# Patient Record
Sex: Male | Born: 1970 | Race: Black or African American | Hispanic: No | Marital: Married | Smoking: Former smoker
Health system: Southern US, Community
[De-identification: ages and names within clinical notes are randomized; demographics above are authoritative.]

## PROBLEM LIST (undated history)

## (undated) DIAGNOSIS — I2510 Atherosclerotic heart disease of native coronary artery without angina pectoris: Secondary | ICD-10-CM

## (undated) DIAGNOSIS — I10 Essential (primary) hypertension: Secondary | ICD-10-CM

## (undated) DIAGNOSIS — K219 Gastro-esophageal reflux disease without esophagitis: Secondary | ICD-10-CM

## (undated) DIAGNOSIS — E669 Obesity, unspecified: Secondary | ICD-10-CM

## (undated) DIAGNOSIS — E785 Hyperlipidemia, unspecified: Secondary | ICD-10-CM

## (undated) DIAGNOSIS — Z9289 Personal history of other medical treatment: Secondary | ICD-10-CM

## (undated) DIAGNOSIS — I252 Old myocardial infarction: Secondary | ICD-10-CM

## (undated) HISTORY — DX: Obesity, unspecified: E66.9

## (undated) HISTORY — DX: Gastro-esophageal reflux disease without esophagitis: K21.9

## (undated) HISTORY — DX: Hyperlipidemia, unspecified: E78.5

---
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
## 2002-07-18 ENCOUNTER — Emergency Department (HOSPITAL_COMMUNITY): Admission: EM | Admit: 2002-07-18 | Discharge: 2002-07-18 | Payer: Self-pay | Admitting: Emergency Medicine

## 2002-07-19 ENCOUNTER — Emergency Department (HOSPITAL_COMMUNITY): Admission: EM | Admit: 2002-07-19 | Discharge: 2002-07-20 | Payer: Self-pay | Admitting: Emergency Medicine

## 2002-07-19 ENCOUNTER — Encounter: Payer: Self-pay | Admitting: Emergency Medicine

## 2002-07-24 ENCOUNTER — Emergency Department (HOSPITAL_COMMUNITY): Admission: EM | Admit: 2002-07-24 | Discharge: 2002-07-24 | Payer: Self-pay | Admitting: Emergency Medicine

## 2002-08-01 ENCOUNTER — Emergency Department (HOSPITAL_COMMUNITY): Admission: EM | Admit: 2002-08-01 | Discharge: 2002-08-01 | Payer: Self-pay | Admitting: Emergency Medicine

## 2003-07-01 ENCOUNTER — Emergency Department (HOSPITAL_COMMUNITY): Admission: EM | Admit: 2003-07-01 | Discharge: 2003-07-01 | Payer: Self-pay | Admitting: Emergency Medicine

## 2003-07-09 ENCOUNTER — Emergency Department (HOSPITAL_COMMUNITY): Admission: EM | Admit: 2003-07-09 | Discharge: 2003-07-09 | Payer: Self-pay | Admitting: Emergency Medicine

## 2003-07-18 ENCOUNTER — Emergency Department (HOSPITAL_COMMUNITY): Admission: EM | Admit: 2003-07-18 | Discharge: 2003-07-19 | Payer: Self-pay

## 2003-09-28 ENCOUNTER — Encounter: Admission: RE | Admit: 2003-09-28 | Discharge: 2003-09-28 | Payer: Self-pay | Admitting: Internal Medicine

## 2003-10-17 ENCOUNTER — Ambulatory Visit (HOSPITAL_COMMUNITY): Admission: RE | Admit: 2003-10-17 | Discharge: 2003-10-17 | Payer: Self-pay | Admitting: Internal Medicine

## 2003-10-29 ENCOUNTER — Encounter: Admission: RE | Admit: 2003-10-29 | Discharge: 2003-10-29 | Payer: Self-pay | Admitting: Internal Medicine

## 2004-03-02 ENCOUNTER — Emergency Department (HOSPITAL_COMMUNITY): Admission: EM | Admit: 2004-03-02 | Discharge: 2004-03-02 | Payer: Self-pay | Admitting: Emergency Medicine

## 2004-03-11 ENCOUNTER — Emergency Department (HOSPITAL_COMMUNITY): Admission: EM | Admit: 2004-03-11 | Discharge: 2004-03-11 | Payer: Self-pay | Admitting: Emergency Medicine

## 2005-10-06 ENCOUNTER — Emergency Department (HOSPITAL_COMMUNITY): Admission: EM | Admit: 2005-10-06 | Discharge: 2005-10-06 | Payer: Self-pay | Admitting: Emergency Medicine

## 2005-10-07 ENCOUNTER — Ambulatory Visit: Payer: Self-pay | Admitting: Internal Medicine

## 2005-11-26 ENCOUNTER — Ambulatory Visit: Payer: Self-pay | Admitting: Internal Medicine

## 2006-01-06 ENCOUNTER — Ambulatory Visit: Payer: Self-pay | Admitting: Internal Medicine

## 2006-01-22 ENCOUNTER — Ambulatory Visit: Payer: Self-pay | Admitting: Internal Medicine

## 2006-03-25 ENCOUNTER — Ambulatory Visit: Payer: Self-pay | Admitting: Internal Medicine

## 2006-04-10 ENCOUNTER — Ambulatory Visit: Payer: Self-pay | Admitting: Internal Medicine

## 2006-07-15 ENCOUNTER — Ambulatory Visit: Payer: Self-pay | Admitting: Internal Medicine

## 2006-12-04 ENCOUNTER — Ambulatory Visit: Payer: Self-pay | Admitting: Internal Medicine

## 2007-02-12 ENCOUNTER — Ambulatory Visit: Payer: Self-pay | Admitting: Internal Medicine

## 2007-03-01 DIAGNOSIS — I10 Essential (primary) hypertension: Secondary | ICD-10-CM

## 2007-03-01 DIAGNOSIS — E785 Hyperlipidemia, unspecified: Secondary | ICD-10-CM

## 2007-03-01 DIAGNOSIS — K219 Gastro-esophageal reflux disease without esophagitis: Secondary | ICD-10-CM

## 2007-03-01 DIAGNOSIS — M109 Gout, unspecified: Secondary | ICD-10-CM | POA: Insufficient documentation

## 2007-03-16 ENCOUNTER — Ambulatory Visit: Payer: Self-pay | Admitting: Internal Medicine

## 2007-04-12 ENCOUNTER — Encounter: Payer: Self-pay | Admitting: *Deleted

## 2007-05-07 ENCOUNTER — Telehealth: Payer: Self-pay | Admitting: Internal Medicine

## 2007-05-07 ENCOUNTER — Ambulatory Visit: Payer: Self-pay | Admitting: Internal Medicine

## 2007-05-11 ENCOUNTER — Ambulatory Visit: Payer: Self-pay | Admitting: Internal Medicine

## 2007-05-11 ENCOUNTER — Encounter (INDEPENDENT_AMBULATORY_CARE_PROVIDER_SITE_OTHER): Payer: Self-pay | Admitting: *Deleted

## 2007-05-11 LAB — CONVERTED CEMR LAB
BUN: 13 mg/dL (ref 6–23)
Basophils Absolute: 0.1 10*3/uL (ref 0.0–0.1)
Basophils Relative: 0.5 % (ref 0.0–1.0)
CO2: 28 meq/L (ref 19–32)
Calcium: 10 mg/dL (ref 8.4–10.5)
Chloride: 105 meq/L (ref 96–112)
Creatinine, Ser: 0.8 mg/dL (ref 0.4–1.5)
Eosinophils Absolute: 0.1 10*3/uL (ref 0.0–0.6)
Eosinophils Relative: 1.1 % (ref 0.0–5.0)
GFR calc Af Amer: 141 mL/min
GFR calc non Af Amer: 116 mL/min
Glucose, Bld: 98 mg/dL (ref 70–99)
HCT: 41 % (ref 39.0–52.0)
Hemoglobin: 13.5 g/dL (ref 13.0–17.0)
Lymphocytes Relative: 22.8 % (ref 12.0–46.0)
MCHC: 33 g/dL (ref 30.0–36.0)
MCV: 83 fL (ref 78.0–100.0)
Monocytes Absolute: 0.9 10*3/uL — ABNORMAL HIGH (ref 0.2–0.7)
Monocytes Relative: 7.8 % (ref 3.0–11.0)
Neutro Abs: 7.5 10*3/uL (ref 1.4–7.7)
Neutrophils Relative %: 67.8 % (ref 43.0–77.0)
Platelets: 398 10*3/uL (ref 150–400)
Potassium: 4.3 meq/L (ref 3.5–5.1)
RBC: 4.93 M/uL (ref 4.22–5.81)
RDW: 13.1 % (ref 11.5–14.6)
Sodium: 139 meq/L (ref 135–145)
Uric Acid, Serum: 7.3 mg/dL — ABNORMAL HIGH (ref 2.4–7.0)
WBC: 11.1 10*3/uL — ABNORMAL HIGH (ref 4.5–10.5)

## 2007-05-12 ENCOUNTER — Encounter: Payer: Self-pay | Admitting: Internal Medicine

## 2007-05-18 ENCOUNTER — Telehealth: Payer: Self-pay | Admitting: Internal Medicine

## 2007-05-26 ENCOUNTER — Ambulatory Visit: Payer: Self-pay | Admitting: Internal Medicine

## 2007-07-02 ENCOUNTER — Telehealth: Payer: Self-pay | Admitting: Internal Medicine

## 2007-08-03 ENCOUNTER — Ambulatory Visit: Payer: Self-pay | Admitting: Internal Medicine

## 2007-08-03 LAB — CONVERTED CEMR LAB
Basophils Absolute: 0.2 10*3/uL — ABNORMAL HIGH (ref 0.0–0.1)
Basophils Relative: 2.5 % — ABNORMAL HIGH (ref 0.0–1.0)
Bilirubin Urine: NEGATIVE
Blood in Urine, dipstick: NEGATIVE
Eosinophils Absolute: 0.1 10*3/uL (ref 0.0–0.6)
Eosinophils Relative: 1.5 % (ref 0.0–5.0)
Glucose, Urine, Semiquant: NEGATIVE
HCT: 40.7 % (ref 39.0–52.0)
Hemoglobin: 13.5 g/dL (ref 13.0–17.0)
Ketones, urine, test strip: NEGATIVE
Lymphocytes Relative: 24.6 % (ref 12.0–46.0)
MCHC: 33.1 g/dL (ref 30.0–36.0)
MCV: 85.2 fL (ref 78.0–100.0)
Monocytes Absolute: 0.6 10*3/uL (ref 0.2–0.7)
Monocytes Relative: 8 % (ref 3.0–11.0)
Neutro Abs: 4.8 10*3/uL (ref 1.4–7.7)
Neutrophils Relative %: 63.4 % (ref 43.0–77.0)
Nitrite: NEGATIVE
Platelets: 304 10*3/uL (ref 150–400)
Protein, U semiquant: NEGATIVE
RBC: 4.78 M/uL (ref 4.22–5.81)
RDW: 13.1 % (ref 11.5–14.6)
Specific Gravity, Urine: 1.02
Urobilinogen, UA: NEGATIVE
WBC Urine, dipstick: NEGATIVE
WBC: 7.5 10*3/uL (ref 4.5–10.5)
pH: 5

## 2007-08-04 LAB — CONVERTED CEMR LAB
Basophils Absolute: 0.2 10*3/uL — ABNORMAL HIGH (ref 0.0–0.1)
Basophils Relative: 2.5 % — ABNORMAL HIGH (ref 0.0–1.0)
Eosinophils Absolute: 0.1 10*3/uL (ref 0.0–0.6)
Eosinophils Relative: 1.5 % (ref 0.0–5.0)
HCT: 40.7 % (ref 39.0–52.0)
Hemoglobin: 13.5 g/dL (ref 13.0–17.0)
Lymphocytes Relative: 24.6 % (ref 12.0–46.0)
MCHC: 33.1 g/dL (ref 30.0–36.0)
MCV: 85.2 fL (ref 78.0–100.0)
Monocytes Absolute: 0.6 10*3/uL (ref 0.2–0.7)
Monocytes Relative: 8 % (ref 3.0–11.0)
Neutro Abs: 4.8 10*3/uL (ref 1.4–7.7)
Neutrophils Relative %: 63.4 % (ref 43.0–77.0)
Platelets: 304 10*3/uL (ref 150–400)
RBC: 4.78 M/uL (ref 4.22–5.81)
RDW: 13.1 % (ref 11.5–14.6)
WBC: 7.5 10*3/uL (ref 4.5–10.5)

## 2007-08-05 ENCOUNTER — Telehealth: Payer: Self-pay | Admitting: Internal Medicine

## 2007-08-18 ENCOUNTER — Encounter (INDEPENDENT_AMBULATORY_CARE_PROVIDER_SITE_OTHER): Payer: Self-pay | Admitting: *Deleted

## 2007-08-18 ENCOUNTER — Ambulatory Visit: Payer: Self-pay | Admitting: Internal Medicine

## 2007-08-26 ENCOUNTER — Telehealth (INDEPENDENT_AMBULATORY_CARE_PROVIDER_SITE_OTHER): Payer: Self-pay | Admitting: *Deleted

## 2008-03-01 ENCOUNTER — Ambulatory Visit: Payer: Self-pay | Admitting: Internal Medicine

## 2008-03-01 ENCOUNTER — Encounter (INDEPENDENT_AMBULATORY_CARE_PROVIDER_SITE_OTHER): Payer: Self-pay | Admitting: *Deleted

## 2008-03-02 ENCOUNTER — Telehealth: Payer: Self-pay | Admitting: Internal Medicine

## 2008-03-02 ENCOUNTER — Ambulatory Visit: Payer: Self-pay

## 2008-03-02 ENCOUNTER — Encounter: Payer: Self-pay | Admitting: Internal Medicine

## 2008-03-02 ENCOUNTER — Encounter (INDEPENDENT_AMBULATORY_CARE_PROVIDER_SITE_OTHER): Payer: Self-pay | Admitting: *Deleted

## 2008-03-27 ENCOUNTER — Ambulatory Visit: Payer: Self-pay | Admitting: Internal Medicine

## 2008-06-23 ENCOUNTER — Telehealth (INDEPENDENT_AMBULATORY_CARE_PROVIDER_SITE_OTHER): Payer: Self-pay | Admitting: *Deleted

## 2008-08-15 ENCOUNTER — Telehealth (INDEPENDENT_AMBULATORY_CARE_PROVIDER_SITE_OTHER): Payer: Self-pay | Admitting: *Deleted

## 2008-08-17 ENCOUNTER — Encounter (INDEPENDENT_AMBULATORY_CARE_PROVIDER_SITE_OTHER): Payer: Self-pay | Admitting: *Deleted

## 2008-08-17 ENCOUNTER — Telehealth: Payer: Self-pay | Admitting: Internal Medicine

## 2008-09-19 ENCOUNTER — Encounter (INDEPENDENT_AMBULATORY_CARE_PROVIDER_SITE_OTHER): Payer: Self-pay | Admitting: *Deleted

## 2008-09-26 ENCOUNTER — Ambulatory Visit: Payer: Self-pay | Admitting: Internal Medicine

## 2008-10-02 ENCOUNTER — Encounter (INDEPENDENT_AMBULATORY_CARE_PROVIDER_SITE_OTHER): Payer: Self-pay | Admitting: *Deleted

## 2008-10-02 LAB — CONVERTED CEMR LAB
ALT: 28 units/L (ref 0–53)
AST: 32 units/L (ref 0–37)
BUN: 11 mg/dL (ref 6–23)
Basophils Absolute: 0.1 10*3/uL (ref 0.0–0.1)
Basophils Relative: 0.8 % (ref 0.0–3.0)
CO2: 27 meq/L (ref 19–32)
Calcium: 9.4 mg/dL (ref 8.4–10.5)
Chloride: 107 meq/L (ref 96–112)
Cholesterol: 210 mg/dL (ref 0–200)
Creatinine, Ser: 0.9 mg/dL (ref 0.4–1.5)
Direct LDL: 124.5 mg/dL
Eosinophils Absolute: 0.1 10*3/uL (ref 0.0–0.7)
Eosinophils Relative: 1.6 % (ref 0.0–5.0)
GFR calc Af Amer: 121 mL/min
GFR calc non Af Amer: 100 mL/min
Glucose, Bld: 96 mg/dL (ref 70–99)
HCT: 40.2 % (ref 39.0–52.0)
HDL: 38.8 mg/dL — ABNORMAL LOW (ref >39.0)
Hemoglobin: 13.5 g/dL (ref 13.0–17.0)
Lymphocytes Relative: 22.4 % (ref 12.0–46.0)
MCHC: 33.7 g/dL (ref 30.0–36.0)
MCV: 84.8 fL (ref 78.0–100.0)
Monocytes Absolute: 0.6 10*3/uL (ref 0.1–1.0)
Monocytes Relative: 8.8 % (ref 3.0–12.0)
Neutro Abs: 4.8 10*3/uL (ref 1.4–7.7)
Neutrophils Relative %: 66.4 % (ref 43.0–77.0)
Platelets: 303 10*3/uL (ref 150–400)
Potassium: 4.1 meq/L (ref 3.5–5.1)
RBC: 4.74 M/uL (ref 4.22–5.81)
RDW: 12.6 % (ref 11.5–14.6)
Sodium: 141 meq/L (ref 135–145)
TSH: 1.03 microintl units/mL (ref 0.35–5.50)
Total CHOL/HDL Ratio: 5.4
Triglycerides: 201 mg/dL (ref 0–149)
VLDL: 40 mg/dL (ref 0–40)
WBC: 7.2 10*3/uL (ref 4.5–10.5)

## 2008-10-13 ENCOUNTER — Telehealth: Payer: Self-pay | Admitting: Internal Medicine

## 2008-12-18 ENCOUNTER — Ambulatory Visit: Payer: Self-pay | Admitting: Family Medicine

## 2008-12-20 ENCOUNTER — Encounter (INDEPENDENT_AMBULATORY_CARE_PROVIDER_SITE_OTHER): Payer: Self-pay | Admitting: *Deleted

## 2008-12-20 ENCOUNTER — Telehealth (INDEPENDENT_AMBULATORY_CARE_PROVIDER_SITE_OTHER): Payer: Self-pay | Admitting: *Deleted

## 2009-01-05 ENCOUNTER — Ambulatory Visit: Payer: Self-pay | Admitting: Family Medicine

## 2009-01-08 ENCOUNTER — Telehealth: Payer: Self-pay | Admitting: Family Medicine

## 2009-01-08 ENCOUNTER — Encounter: Payer: Self-pay | Admitting: Family Medicine

## 2009-01-09 ENCOUNTER — Encounter (INDEPENDENT_AMBULATORY_CARE_PROVIDER_SITE_OTHER): Payer: Self-pay | Admitting: *Deleted

## 2009-01-09 ENCOUNTER — Ambulatory Visit: Payer: Self-pay | Admitting: Internal Medicine

## 2009-02-06 ENCOUNTER — Ambulatory Visit: Payer: Self-pay | Admitting: Family Medicine

## 2009-02-06 DIAGNOSIS — J309 Allergic rhinitis, unspecified: Secondary | ICD-10-CM | POA: Insufficient documentation

## 2009-03-05 ENCOUNTER — Telehealth (INDEPENDENT_AMBULATORY_CARE_PROVIDER_SITE_OTHER): Payer: Self-pay | Admitting: *Deleted

## 2009-03-16 ENCOUNTER — Ambulatory Visit: Payer: Self-pay | Admitting: Internal Medicine

## 2009-03-20 LAB — CONVERTED CEMR LAB
ALT: 40 units/L (ref 0–53)
AST: 40 units/L — ABNORMAL HIGH (ref 0–37)
Uric Acid, Serum: 6.1 mg/dL (ref 4.0–7.8)

## 2009-04-03 ENCOUNTER — Telehealth (INDEPENDENT_AMBULATORY_CARE_PROVIDER_SITE_OTHER): Payer: Self-pay | Admitting: *Deleted

## 2009-08-17 ENCOUNTER — Ambulatory Visit: Payer: Self-pay | Admitting: Internal Medicine

## 2009-08-17 LAB — CONVERTED CEMR LAB: Blood Glucose, Fasting: 337 mg/dL

## 2009-08-24 LAB — CONVERTED CEMR LAB
ALT: 41 units/L (ref 0–53)
AST: 40 units/L — ABNORMAL HIGH (ref 0–37)
BUN: 17 mg/dL (ref 6–23)
Calcium: 10.2 mg/dL (ref 8.4–10.5)
Chloride: 95 meq/L — ABNORMAL LOW (ref 96–112)
Creatinine, Ser: 0.9 mg/dL (ref 0.4–1.5)
GFR calc non Af Amer: 120.73 mL/min (ref >60)
Glucose, Bld: 327 mg/dL — ABNORMAL HIGH (ref 70–99)
Hgb A1c MFr Bld: 11.1 % — ABNORMAL HIGH (ref 4.6–6.5)
Potassium: 4.6 meq/L (ref 3.5–5.1)
Sodium: 130 meq/L — ABNORMAL LOW (ref 135–145)

## 2009-09-05 ENCOUNTER — Telehealth: Payer: Self-pay | Admitting: Internal Medicine

## 2009-10-01 ENCOUNTER — Telehealth (INDEPENDENT_AMBULATORY_CARE_PROVIDER_SITE_OTHER): Payer: Self-pay | Admitting: *Deleted

## 2009-10-16 ENCOUNTER — Ambulatory Visit: Payer: Self-pay | Admitting: Family Medicine

## 2009-10-16 DIAGNOSIS — S335XXA Sprain of ligaments of lumbar spine, initial encounter: Secondary | ICD-10-CM | POA: Insufficient documentation

## 2009-10-16 DIAGNOSIS — M6530 Trigger finger, unspecified finger: Secondary | ICD-10-CM | POA: Insufficient documentation

## 2009-10-16 DIAGNOSIS — M25549 Pain in joints of unspecified hand: Secondary | ICD-10-CM | POA: Insufficient documentation

## 2009-10-16 LAB — CONVERTED CEMR LAB
Bilirubin Urine: NEGATIVE
Blood in Urine, dipstick: NEGATIVE
Glucose, Urine, Semiquant: NEGATIVE
Ketones, urine, test strip: NEGATIVE
Nitrite: NEGATIVE
Protein, U semiquant: NEGATIVE
Specific Gravity, Urine: 1.015
Urobilinogen, UA: 0.2
WBC Urine, dipstick: NEGATIVE
pH: 6

## 2009-10-22 ENCOUNTER — Telehealth (INDEPENDENT_AMBULATORY_CARE_PROVIDER_SITE_OTHER): Payer: Self-pay | Admitting: *Deleted

## 2009-10-22 LAB — CONVERTED CEMR LAB
BUN: 12 mg/dL (ref 6–23)
Basophils Absolute: 0 10*3/uL (ref 0.0–0.1)
Basophils Relative: 0.6 % (ref 0.0–3.0)
CO2: 26 meq/L (ref 19–32)
Calcium: 9.7 mg/dL (ref 8.4–10.5)
Chloride: 105 meq/L (ref 96–112)
Creatinine, Ser: 0.8 mg/dL (ref 0.4–1.5)
Eosinophils Absolute: 0.2 10*3/uL (ref 0.0–0.7)
Eosinophils Relative: 2.1 % (ref 0.0–5.0)
Folate: 9.8 ng/mL
GFR calc non Af Amer: 138.2 mL/min (ref >60)
Glucose, Bld: 88 mg/dL (ref 70–99)
HCT: 40.7 % (ref 39.0–52.0)
Hemoglobin: 13.5 g/dL (ref 13.0–17.0)
Lymphocytes Relative: 24.1 % (ref 12.0–46.0)
Lymphs Abs: 1.8 10*3/uL (ref 0.7–4.0)
MCHC: 33.3 g/dL (ref 30.0–36.0)
MCV: 85.9 fL (ref 78.0–100.0)
Monocytes Absolute: 0.8 10*3/uL (ref 0.1–1.0)
Monocytes Relative: 10.7 % (ref 3.0–12.0)
Neutro Abs: 4.7 10*3/uL (ref 1.4–7.7)
Neutrophils Relative %: 62.5 % (ref 43.0–77.0)
Platelets: 288 10*3/uL (ref 150.0–400.0)
Potassium: 4.3 meq/L (ref 3.5–5.1)
RBC: 4.73 M/uL (ref 4.22–5.81)
RDW: 13.2 % (ref 11.5–14.6)
Rheumatoid fact SerPl-aCnc: 24.9 intl units/mL — ABNORMAL HIGH (ref 0.0–20.0)
Sed Rate: 25 mm/hr — ABNORMAL HIGH (ref 0–22)
Sodium: 140 meq/L (ref 135–145)
TSH: 0.92 microintl units/mL (ref 0.35–5.50)
Vitamin B-12: 183 pg/mL — ABNORMAL LOW (ref 211–911)
WBC: 7.5 10*3/uL (ref 4.5–10.5)

## 2009-10-31 ENCOUNTER — Ambulatory Visit: Payer: Self-pay | Admitting: Family Medicine

## 2009-10-31 DIAGNOSIS — D518 Other vitamin B12 deficiency anemias: Secondary | ICD-10-CM

## 2009-10-31 DIAGNOSIS — D519 Vitamin B12 deficiency anemia, unspecified: Secondary | ICD-10-CM | POA: Insufficient documentation

## 2009-11-07 ENCOUNTER — Ambulatory Visit: Payer: Self-pay | Admitting: Internal Medicine

## 2009-11-13 ENCOUNTER — Ambulatory Visit: Payer: Self-pay | Admitting: Internal Medicine

## 2009-11-23 ENCOUNTER — Ambulatory Visit: Payer: Self-pay | Admitting: Internal Medicine

## 2009-11-27 LAB — CONVERTED CEMR LAB
Cholesterol: 181 mg/dL (ref 0–200)
Folate: 16.8 ng/mL
HDL: 32 mg/dL — ABNORMAL LOW (ref >39)
Hgb A1c MFr Bld: 6.6 % — ABNORMAL HIGH (ref 4.6–6.1)
LDL Cholesterol: 114 mg/dL — ABNORMAL HIGH (ref 0–99)
Microalb, Ur: 2.13 mg/dL — ABNORMAL HIGH (ref 0.00–1.89)
Total CHOL/HDL Ratio: 5.7
Triglycerides: 176 mg/dL — ABNORMAL HIGH (ref <150)
VLDL: 35 mg/dL (ref 0–40)
Vitamin B-12: 348 pg/mL (ref 211–911)

## 2010-01-21 ENCOUNTER — Encounter: Payer: Self-pay | Admitting: Internal Medicine

## 2010-01-21 ENCOUNTER — Telehealth (INDEPENDENT_AMBULATORY_CARE_PROVIDER_SITE_OTHER): Payer: Self-pay | Admitting: *Deleted

## 2010-01-21 ENCOUNTER — Telehealth: Payer: Self-pay | Admitting: Internal Medicine

## 2010-01-22 ENCOUNTER — Encounter: Payer: Self-pay | Admitting: Internal Medicine

## 2010-03-12 ENCOUNTER — Encounter (INDEPENDENT_AMBULATORY_CARE_PROVIDER_SITE_OTHER): Payer: Self-pay | Admitting: *Deleted

## 2010-05-02 ENCOUNTER — Ambulatory Visit: Payer: Self-pay | Admitting: Internal Medicine

## 2010-05-07 LAB — CONVERTED CEMR LAB
ALT: 27 units/L (ref 0–53)
AST: 24 units/L (ref 0–37)
Cholesterol: 172 mg/dL (ref 0–200)
HDL: 36.7 mg/dL — ABNORMAL LOW (ref >39.00)
Hgb A1c MFr Bld: 6.7 % — ABNORMAL HIGH (ref 4.6–6.5)
LDL Cholesterol: 99 mg/dL (ref 0–99)
Total CHOL/HDL Ratio: 5
Triglycerides: 182 mg/dL — ABNORMAL HIGH (ref 0.0–149.0)
VLDL: 36.4 mg/dL (ref 0.0–40.0)

## 2010-05-14 ENCOUNTER — Ambulatory Visit: Payer: Self-pay | Admitting: Internal Medicine

## 2010-05-28 ENCOUNTER — Ambulatory Visit: Payer: Self-pay | Admitting: Internal Medicine

## 2010-06-06 ENCOUNTER — Ambulatory Visit: Payer: Self-pay | Admitting: Family Medicine

## 2010-06-07 ENCOUNTER — Telehealth (INDEPENDENT_AMBULATORY_CARE_PROVIDER_SITE_OTHER): Payer: Self-pay | Admitting: *Deleted

## 2010-06-07 LAB — CONVERTED CEMR LAB: Uric Acid, Serum: 5 mg/dL (ref 4.0–7.8)

## 2010-06-10 ENCOUNTER — Encounter (INDEPENDENT_AMBULATORY_CARE_PROVIDER_SITE_OTHER): Payer: Self-pay | Admitting: *Deleted

## 2010-06-12 LAB — CONVERTED CEMR LAB
Folate: 7.5 ng/mL
Vitamin B-12: 278 pg/mL (ref 211–911)

## 2010-06-25 ENCOUNTER — Telehealth: Payer: Self-pay | Admitting: Family Medicine

## 2010-07-19 ENCOUNTER — Ambulatory Visit: Payer: Self-pay | Admitting: Family Medicine

## 2010-07-19 DIAGNOSIS — IMO0002 Reserved for concepts with insufficient information to code with codable children: Secondary | ICD-10-CM | POA: Insufficient documentation

## 2010-07-19 DIAGNOSIS — R6882 Decreased libido: Secondary | ICD-10-CM | POA: Insufficient documentation

## 2010-07-19 DIAGNOSIS — E538 Deficiency of other specified B group vitamins: Secondary | ICD-10-CM | POA: Insufficient documentation

## 2010-07-19 LAB — HM DIABETES FOOT EXAM: HM Diabetic Foot Exam: NORMAL

## 2010-07-22 LAB — CONVERTED CEMR LAB
ALT: 32 units/L (ref 0–53)
AST: 28 units/L (ref 0–37)
Albumin: 4.1 g/dL (ref 3.5–5.2)
Alkaline Phosphatase: 67 units/L (ref 39–117)
BUN: 18 mg/dL (ref 6–23)
Bilirubin, Direct: 0.1 mg/dL (ref 0.0–0.3)
CO2: 23 meq/L (ref 19–32)
Calcium: 9.6 mg/dL (ref 8.4–10.5)
Chloride: 106 meq/L (ref 96–112)
Cholesterol: 176 mg/dL (ref 0–200)
Creatinine, Ser: 0.8 mg/dL (ref 0.4–1.5)
Direct LDL: 99.7 mg/dL
GFR calc non Af Amer: 135.7 mL/min (ref >60)
Glucose, Bld: 184 mg/dL — ABNORMAL HIGH (ref 70–99)
HDL: 29.9 mg/dL — ABNORMAL LOW (ref >39.00)
Hgb A1c MFr Bld: 8 % — ABNORMAL HIGH (ref 4.6–6.5)
Potassium: 4.2 meq/L (ref 3.5–5.1)
Sodium: 137 meq/L (ref 135–145)
Testosterone: 231.4 ng/dL — ABNORMAL LOW (ref 350.00–890.00)
Total Bilirubin: 0.3 mg/dL (ref 0.3–1.2)
Total CHOL/HDL Ratio: 6
Total Protein: 7.3 g/dL (ref 6.0–8.3)
Triglycerides: 414 mg/dL — ABNORMAL HIGH (ref 0.0–149.0)
VLDL: 82.8 mg/dL — ABNORMAL HIGH (ref 0.0–40.0)

## 2010-08-01 ENCOUNTER — Telehealth: Payer: Self-pay | Admitting: Family Medicine

## 2010-08-05 ENCOUNTER — Telehealth: Payer: Self-pay | Admitting: Family Medicine

## 2010-08-12 ENCOUNTER — Telehealth: Payer: Self-pay | Admitting: Family Medicine

## 2010-08-14 ENCOUNTER — Telehealth: Payer: Self-pay | Admitting: Family Medicine

## 2010-09-03 ENCOUNTER — Encounter: Payer: Self-pay | Admitting: Family Medicine

## 2010-09-10 ENCOUNTER — Telehealth: Payer: Self-pay | Admitting: Family Medicine

## 2010-09-13 ENCOUNTER — Ambulatory Visit: Payer: Self-pay | Admitting: Family Medicine

## 2010-10-14 ENCOUNTER — Telehealth: Payer: Self-pay | Admitting: Family Medicine

## 2010-10-16 ENCOUNTER — Ambulatory Visit: Payer: Self-pay | Admitting: Family Medicine

## 2010-10-16 ENCOUNTER — Telehealth: Payer: Self-pay | Admitting: Family Medicine

## 2010-10-16 ENCOUNTER — Encounter (INDEPENDENT_AMBULATORY_CARE_PROVIDER_SITE_OTHER): Payer: Self-pay | Admitting: *Deleted

## 2010-10-16 DIAGNOSIS — M25519 Pain in unspecified shoulder: Secondary | ICD-10-CM | POA: Insufficient documentation

## 2010-10-17 ENCOUNTER — Encounter (INDEPENDENT_AMBULATORY_CARE_PROVIDER_SITE_OTHER): Payer: Self-pay | Admitting: *Deleted

## 2010-10-25 ENCOUNTER — Ambulatory Visit: Payer: Self-pay | Admitting: Family Medicine

## 2010-10-29 ENCOUNTER — Telehealth (INDEPENDENT_AMBULATORY_CARE_PROVIDER_SITE_OTHER): Payer: Self-pay | Admitting: *Deleted

## 2010-10-29 LAB — CONVERTED CEMR LAB
ALT: 34 units/L (ref 0–53)
AST: 28 units/L (ref 0–37)
Albumin: 4.1 g/dL (ref 3.5–5.2)
Alkaline Phosphatase: 47 units/L (ref 39–117)
BUN: 12 mg/dL (ref 6–23)
Bilirubin, Direct: 0.1 mg/dL (ref 0.0–0.3)
CO2: 24 meq/L (ref 19–32)
Calcium: 9.5 mg/dL (ref 8.4–10.5)
Chloride: 101 meq/L (ref 96–112)
Cholesterol: 184 mg/dL (ref 0–200)
Creatinine, Ser: 0.8 mg/dL (ref 0.4–1.5)
GFR calc non Af Amer: 133.61 mL/min (ref >60.00)
Glucose, Bld: 126 mg/dL — ABNORMAL HIGH (ref 70–99)
HDL: 33.6 mg/dL — ABNORMAL LOW (ref >39.00)
Hgb A1c MFr Bld: 7.6 % — ABNORMAL HIGH (ref 4.6–6.5)
LDL Cholesterol: 118 mg/dL — ABNORMAL HIGH (ref 0–99)
Potassium: 4 meq/L (ref 3.5–5.1)
Sodium: 137 meq/L (ref 135–145)
Total Bilirubin: 0.6 mg/dL (ref 0.3–1.2)
Total CHOL/HDL Ratio: 5
Total Protein: 7.3 g/dL (ref 6.0–8.3)
Triglycerides: 162 mg/dL — ABNORMAL HIGH (ref 0.0–149.0)
VLDL: 32.4 mg/dL (ref 0.0–40.0)

## 2010-11-13 HISTORY — PX: BICEPS TENDON REPAIR: SHX566

## 2010-11-27 ENCOUNTER — Telehealth (INDEPENDENT_AMBULATORY_CARE_PROVIDER_SITE_OTHER): Payer: Self-pay | Admitting: *Deleted

## 2010-12-08 LAB — CONVERTED CEMR LAB
ALT: 21 units/L (ref 0–53)
AST: 17 units/L (ref 0–37)
Cholesterol: 244 mg/dL (ref 0–200)
Direct LDL: 144.8 mg/dL
HDL: 39.1 mg/dL (ref >39.0)
TSH: 0.85 microintl units/mL (ref 0.35–5.50)
Total CHOL/HDL Ratio: 6.2
Triglycerides: 275 mg/dL (ref 0–149)
VLDL: 55 mg/dL — ABNORMAL HIGH (ref 0–40)

## 2010-12-10 NOTE — Progress Notes (Signed)
Summary: Samples request--Diovan and Prilosec  Phone Note Refill Request Message from:  Patient on October 14, 2010 11:53 AM  Refills Requested: Medication #1:  PRILOSEC OTC 20 MG TBEC 1 before breakfast and one before dinner  Medication #2:  DIOVAN 160 MG TABS 1 by mouth QD. Patient scheduled follow up for next Friday.  Initial call taken by: Lucious Groves CMA,  October 14, 2010 11:58 AM    New/Updated Medications: DIOVAN 160 MG TABS (VALSARTAN) 1 by mouth QD Prescriptions: PRILOSEC OTC 20 MG TBEC (OMEPRAZOLE MAGNESIUM) 1 before breakfast and one before dinner  #14 x 0   Entered by:   Lucious Groves CMA   Authorized by:   Neena Rhymes MD   Signed by:   Lucious Groves CMA on 10/14/2010   Method used:   Samples Given   RxID:   [id number] DIOVAN 160 MG TABS (VALSARTAN) 1 by mouth QD  #14 x 0   Entered by:   Lucious Groves CMA   Authorized by:   Neena Rhymes MD   Signed by:   Lucious Groves CMA on 10/14/2010   Method used:   Samples Given   RxID:   [id number]

## 2010-12-10 NOTE — Medication Information (Signed)
Summary: Prior Authorization for Nasacort/[employer]  Prior Authorization for Nasacort/[employer]   Imported By: Lanelle Bal 01/28/2010 08:15:29  _____________________________________________________________________  External Attachment:    Type:   Image     Comment:   External Document

## 2010-12-10 NOTE — Letter (Signed)
Summary: Out of Work  [facility]  [address] [place], [place] [address]   Phone: [phone]  Fax: [phone]    June 10, 2010   Employee:  Aaron Franco    To Whom It May Concern:   For Medical reasons, please excuse the above named employee from work for the following dates:  Start:   06/03/2010  End:   06/10/2010  If you need additional information, please feel free to contact our office.         Sincerely,    Harold Barban

## 2010-12-10 NOTE — Progress Notes (Signed)
Summary: change from flonase to nasacort  Phone Note Call from Patient Call back at [facility] Phone [phone]   Summary of Call: Patient requesting rx for nasacort.  Patient was orginally rx'd nasacort last year but had to change to flonase because of insurance.  Patient would like to go back to nasacort because generic flonase does not work as well [employer]  January 21, 2010 11:58 AM     New/Updated Medications: NASACORT AQ 55 MCG/ACT AERS (TRIAMCINOLONE ACETONIDE(NASAL)) 2 sprays each nostril qd Prescriptions: NASACORT AQ 55 MCG/ACT AERS (TRIAMCINOLONE ACETONIDE(NASAL)) 2 sprays each nostril qd  #1 x 6   Entered by:   Shary Decamp   Authorized by:   Nolon Rod. Simren Popson MD   Signed by:   Shary Decamp on 01/21/2010   Method used:   Electronically to        [facility] (retail)       [address]       [place], [place]  [id number]       Ph: [phone]       Fax: [phone]   RxID:   [id number]

## 2010-12-10 NOTE — Progress Notes (Signed)
Summary: Referral numbers needed  Phone Note Call from Patient   Summary of Call: Patient left message on triage that he has to r/s his referrals that have been made and needs the numbers.  I left message on voicemail notifying the patient that # for nutrition is [phone] and # for Dr. Everardo All is [phone]. Initial call taken by: Lucious Groves CMA,  August 14, 2010 12:44 PM

## 2010-12-10 NOTE — Assessment & Plan Note (Signed)
Summary: cpx/ns/kdc--add b12//ccm   Vital Signs:  Patient profile:   40 year old male Height:      71.5 inches Weight:      321.4 pounds BMI:     44.36 Pulse rate:   60 / minute Pulse rhythm:   regular BP sitting:   130 / 80  (left arm) Cuff size:   large  Vitals Entered By: Shary Decamp (November 23, 2009 3:00 PM) CC: cpx - fasting Is Patient Diabetic? Yes   History of Present Illness: CPX hurt his R toe a while back, occasionally pain in the area   DM--  CBGs range from 87 to 166 (rarely goes high) nutritionist eval pending   needs B12 checked   Preventive Screening-Counseling & Management  Alcohol-Tobacco     Alcohol drinks/day: socially     Smoking Status: quit     Year Quit: 2000  Caffeine-Diet-Exercise     Caffeine use/day: 1  Current Medications (verified): 1)  Simvastatin 40 Mg Tabs (Simvastatin) .Marland Kitchen.. 1 By Mouth Qhs 2)  Colchicine 0.6 Mg Tabs (Colchicine) .... Take 1 Tablet By Mouth Once A Day 3)  Indomethacin 25 Mg Caps (Indomethacin) .... Take 1 Capsule 3 Times A Day 4)  Allopurinol 300 Mg Tabs (Allopurinol) .... Take 1 Tablet By Mouth Once A Day 5)  Norvasc 10 Mg Tabs (Amlodipine Besylate) .Marland Kitchen.. 1 By Mouth Once Daily 6)  Fluticasone Propionate 50 Mcg/act Susp (Fluticasone Propionate) .... 2 Sprays Each Nostril Once Daily 7)  Prilosec Otc 20 Mg Tbec (Omeprazole Magnesium) .Marland Kitchen.. 1 Before Breakfast and One Before Dinner 8)  One Touch Ultra .... Test Strips Lancets As Directed Dx 250.00 9)  Metformin Hcl 500 Mg Tabs (Metformin Hcl) .... One By Mouth Twice A Day 10)  Glimepiride 2 Mg Tabs (Glimepiride) .... One By Mouth Every Morning  Allergies (verified): 1)  ! Prednisone  Past History:  Past Medical History: Reviewed history from 08/17/2009 and no changes required. DIABETES Dx 08-2009 GERD Gout Hyperlipidemia Hypertension  Past Surgical History: L hand nerve repair after a cut  (1990s)  Family History: Reviewed history from 09/26/2008 and no  changes required. MI --father had a heart attack at age 27 colon cancer--no prostate cancer  no DM--F CHF--F  Social History: Reviewed history from 09/26/2008 and no changes required. Divorced, to remarried June 2011 two children Occupation: maintanence Tobacco-- no ETOH-- occasionally Smoking Status:  quit Caffeine use/day:  1  Review of Systems General:  Denies fatigue and fever. ENT:  occasionally nosebleed. CV:  Denies chest pain or discomfort and swelling of feet. Resp:  Denies cough and shortness of breath. GI:  Denies bloody stools, diarrhea, nausea, and vomiting. GU:  Denies dysuria and hematuria.  Physical Exam  General:  alert, well-developed, and overweight-appearing.   Neck:  no thyromegaly.   Lungs:  normal respiratory effort, no intercostal retractions, no accessory muscle use, and normal breath sounds.   Heart:  normal rate, regular rhythm, no murmur, and no gallop.   Abdomen:  soft, non-tender, no distention, no masses, no guarding, and no rigidity.   Pulses:  normal pedal pulses bilaterally  Extremities:  no pretibial edema bilaterally  Psych:  Cognition and judgment appear intact. Alert and cooperative with normal attention span and concentration.  not anxious appearing and not depressed appearing.    Diabetes Management Exam:    Foot Exam (with socks and/or shoes not present):       Sensory-Pinprick/Light touch:  Left medial foot (L-4): normal          Left dorsal foot (L-5): normal          Left lateral foot (S-1): normal          Right medial foot (L-4): normal          Right dorsal foot (L-5): normal          Right lateral foot (S-1): normal       Sensory-Monofilament:          Left foot: normal          Right foot: normal       Inspection:          Left foot: normal          Right foot: normal       Nails:          Left foot: normal          Right foot: normal   Impression & Recommendations:  Problem # 1:  HEALTH MAINTENANCE EXAM  (ICD-V70.0) Td 2009 had a flu shot already joining a gym and states he  "will get serious about diet this year" praised for his plans to get married in June 2011  Problem # 2:  DM (ICD-250.00) encouraged a healthier lifestyle   His updated medication list for this problem includes:    Glimepiride 2 Mg Tabs (Glimepiride) ..... One by mouth every morning    Metformin Hcl 500 Mg Tabs (Metformin hcl) ..... 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 1 ml monthly  Problem # 4:  HYPERTENSION (ICD-401.9) at goal  His updated medication list for this problem includes:    Norvasc 10 Mg Tabs (Amlodipine besylate) .Marland Kitchen... 1 by mouth once daily  BP today: 130/80 Prior BP: 134/80 (10/16/2009)  Labs Reviewed: K+: 4.3 (10/16/2009) Creat: : 0.8 (10/16/2009)   Chol: 210 (09/26/2008)   HDL: 38.8 (09/26/2008)   LDL: DEL (09/26/2008)   TG: 201 (09/26/2008)  Problem # 5:  HYPERLIPIDEMIA (ICD-272.4) labs His updated medication list for this problem includes:    Simvastatin 40 Mg Tabs (Simvastatin) .Marland Kitchen... 1 by mouth qhs  Orders: Venipuncture (53664)  Labs Reviewed: SGOT: 40 (08/17/2009)   SGPT: 41 (08/17/2009)   HDL:38.8 (09/26/2008), 39.1 (05/26/2007)  LDL:DEL (09/26/2008), DEL (05/26/2007)  Chol:210 (09/26/2008), 244 (05/26/2007)  Trig:201 (09/26/2008), 275 (05/26/2007)  Problem # 6:  GOUT (ICD-274.9) last uric acid within normal The following medications were removed from the medication list:    Mobic 15 Mg Tabs (Meloxicam) .Marland Kitchen... 1/2 - 1 by mouth once daily His updated medication list for  this problem includes:    Allopurinol 300 Mg Tabs (Allopurinol) .Marland Kitchen... Take 1 tablet by mouth once a day    Colchicine 0.6 Mg Tabs (Colchicine) .Marland Kitchen... Take 1 tablet by mouth once a day    Indomethacin 25 Mg Caps (Indomethacin) .Marland Kitchen... Take 1 capsule 3 times a day    Complete Medication List: 1)  Glimepiride 2 Mg Tabs (Glimepiride) .... One by mouth every morning 2)  Metformin Hcl 500 Mg Tabs (Metformin hcl) .Marland KitchenMarland KitchenMarland Kitchen  One by mouth twice a day 3)  Simvastatin 40 Mg Tabs (Simvastatin) .Marland Kitchen.. 1 by mouth qhs 4)  Norvasc 10 Mg Tabs (Amlodipine besylate) .Marland Kitchen.. 1 by mouth once daily 5)  Allopurinol 300 Mg Tabs (Allopurinol) .... Take 1 tablet by mouth once a day 6)  Colchicine 0.6 Mg Tabs (Colchicine) .... Take 1 tablet by mouth once a day 7)  Indomethacin 25 Mg Caps (Indomethacin) .... Take 1 capsule 3 times a day 8)  Fluticasone Propionate 50 Mcg/act Susp (Fluticasone propionate) .... 2 sprays each nostril once daily 9)  Prilosec Otc 20 Mg Tbec (Omeprazole magnesium) .Marland Kitchen.. 1 before breakfast and one before dinner 10)  One Touch Ultra  .... Test strips lancets as directed dx 250.00 11)  Cyanocobalamin 1000 Mcg/ml Soln (Cyanocobalamin) .Marland Kitchen.. 1 ml monthly  Patient Instructions: 1)  change your B12 shots to once a month 2)  Please schedule a follow-up appointment in 3 months .  Prescriptions: ONE TOUCH ULTRA test strips lancets as directed dx 250.00  #100 x 6   Entered by:   Shary Decamp   Authorized by:   Nolon Rod. Ronda Kazmi MD   Signed by:   Shary Decamp on 11/23/2009   Method used:   Faxed to ...       [facility] (retail)       [address]       [place], [place]  [address]       Ph: [phone]       Fax: [phone]   RxID:   [id number] FLUTICASONE PROPIONATE 50 MCG/ACT SUSP (FLUTICASONE PROPIONATE) 2 sprays each nostril once daily  #1 x 6   Entered by:   Shary Decamp   Authorized by:   Nolon Rod. Latria Mccarron MD   Signed by:   Shary Decamp on 11/23/2009   Method used:    Electronically to        [facility] [address] (retail)       [address]       [place], [place]  [address]       Ph: [phone]       Fax: [phone]   RxID:   [id number] INDOMETHACIN 25 MG CAPS (INDOMETHACIN) take 1 capsule 3 times a day  #30 Capsule x 6   Entered by:   Shary Decamp   Authorized by:   Nolon Rod. Riad Wagley MD   Signed by:   Shary Decamp on 11/23/2009   Method used:   Electronically to        [facility] [facility] (retail)       [address]       [place], [place]  [address]       Ph: [phone]       Fax: [phone]   RxID:   [id number] COLCHICINE 0.6 MG TABS (COLCHICINE) Take 1 tablet by mouth once a day  #30 x 6   Entered by:   Shary Decamp   Authorized by:   Nolon Rod. Burlon Centrella MD   Signed by:   Shary Decamp on 11/23/2009   Method used:   Electronically to        [facility] [address] (retail)       [address]       [place], [place]  [address]       Ph: [phone]       Fax: [phone]   RxID:   [id number] ALLOPURINOL 300 MG TABS (ALLOPURINOL) Take 1  tablet by mouth once a day  #30 Tablet x 6   Entered by:   Shary Decamp   Authorized by:   Nolon Rod. Rahma Meller MD   Signed by:   Shary Decamp on 11/23/2009   Method used:   Electronically to        [facility] [address] (retail)       [address]       [place], [place]  [address]       Ph: [phone]       Fax: [phone]   RxID:   [id number] NORVASC 10 MG TABS (AMLODIPINE BESYLATE) 1 by mouth once daily  #30 Tablet x 6   Entered by:   Shary Decamp   Authorized by:   Nolon Rod. Soren Pigman MD   Signed by:   Shary Decamp on 11/23/2009   Method used:   Electronically to        [facility] [address] (retail)       [address]       [place], [place]  [address]       Ph: [phone]       Fax: [phone]   RxID:   [id number] SIMVASTATIN 40 MG TABS (SIMVASTATIN) 1 by mouth qhs  #30 Tablet x 6   Entered by:    Shary Decamp   Authorized by:   Nolon Rod. Kierria Feigenbaum MD   Signed by:   Shary Decamp on 11/23/2009   Method used:   Electronically to        [facility] (retail)       [address]       [place], [place]  [address]       Ph: [phone]       Fax: [phone]   RxID:   [id number] METFORMIN HCL 500 MG TABS (METFORMIN HCL) one by mouth twice a day  #60 x 6   Entered by:   Shary Decamp   Authorized by:   Nolon Rod. Ethelda Deangelo MD   Signed by:   Shary Decamp on 11/23/2009   Method used:   Electronically to        [facility] [address] [address] (retail)       [address]       [place], [place]  [address]       Ph: [phone]       Fax: [phone]   RxID:   [id number] GLIMEPIRIDE 2 MG TABS (GLIMEPIRIDE) one by mouth every morning  #30 Tablet x 6   Entered by:   Shary Decamp   Authorized by:   Nolon Rod. 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 Patient age 3-39 [phone]

## 2010-12-10 NOTE — Assessment & Plan Note (Signed)
Summary: goute flare up/paz pt//kn   Vital Signs:  Patient profile:   40 year old male Weight:      337 pounds Pulse rate:   96 / minute BP sitting:   148 / 82  (left arm)  Vitals Entered By: Doristine Devoid CMA (June 06, 2010 1:56 PM) CC: gout flare L foot x1 week    History of Present Illness: 40 yo man here today for ? gout flare in L foot.  sxs started Sunday.  didn't change diet or increase ETOH intake.  feels similar to other gout episodes- has had sxs since 2002.  Taking Allopurinol and Indomethacin daily.  is out of colchicine.  Current Medications (verified): 1)  Glimepiride 2 Mg Tabs (Glimepiride) .... One By Mouth Every Morning - Due Office Visit in May 2011 2)  Metformin Hcl 500 Mg Tabs (Metformin Hcl) .... One By Mouth Twice A Day 3)  Simvastatin 40 Mg Tabs (Simvastatin) .Marland Kitchen.. 1 1/2 By Mouth Once Daily - Due Office Visit 4)  Norvasc 10 Mg Tabs (Amlodipine Besylate) .Marland Kitchen.. 1 By Mouth Once Daily 5)  Allopurinol 300 Mg Tabs (Allopurinol) .... Take 1 Tablet By Mouth Once A Day 6)  Colchicine 0.6 Mg Tabs (Colchicine) .... Take 1 Tablet By Mouth Two Times A Day and Then Decrease To Daily As Symptoms Allow 7)  Indomethacin 25 Mg Caps (Indomethacin) .... Take 1 Capsule 3 Times A Day.  Can Increase To 2 Capsules Three Times A Day As Needed. 8)  Nasacort Aq 55 Mcg/act Aers (Triamcinolone Acetonide(Nasal)) .... 2 Sprays Each Nostril Qd 9)  Prilosec Otc 20 Mg Tbec (Omeprazole Magnesium) .Marland Kitchen.. 1 Before Breakfast and One Before Dinner 10)  One Touch Ultra .... Test Strips Lancets As Directed Dx 250.00 11)  Cyanocobalamin 1000 Mcg/ml Soln (Cyanocobalamin) .Marland Kitchen.. 1 Ml Monthly 12)  Vicodin 5-500 Mg Tabs (Hydrocodone-Acetaminophen) .Marland Kitchen.. 1 Tab By Mouth Q6 As Needed For Severe Pain.  Allergies (verified): 1)  ! Prednisone  Past History:  Past Medical History: Last updated: 08/17/2009 DIABETES Dx 08-2009 GERD Gout Hyperlipidemia Hypertension  Review of Systems      See HPI  Physical  Exam  General:  alert, well-developed, and overweight-appearing.   Pulses:  normal pedal pulses bilaterally  Extremities:  L foot warm and tender to touch on dorsal surface.  + edema   Impression & Recommendations:  Problem # 1:  GOUT (ICD-274.9) Assessment Unchanged most likely gout but given lack of particular joint involvement will check Uric Acid to confirm.  restart colchicine and increase indomethacin dose temporarily.  reviewed supportive care and red flags that should prompt return. His updated medication list for this problem includes:    Allopurinol 300 Mg Tabs (Allopurinol) .Marland Kitchen... Take 1 tablet by mouth once a day    Colchicine 0.6 Mg Tabs (Colchicine) .Marland Kitchen... Take 1 tablet by mouth two times a day and then decrease to daily as symptoms allow    Indomethacin 25 Mg Caps (Indomethacin) .Marland Kitchen... Take 1 capsule 3 times a day.  can increase to 2 capsules three times a day as needed.  Orders: Venipuncture ([address]) TLB-Uric Acid, Blood (84550-URIC) Specimen Handling ([address])  Complete Medication List: 1)  Glimepiride 2 Mg Tabs (Glimepiride) .... One by mouth every morning - due office visit in may 2011 2)  Metformin Hcl 500 Mg Tabs (Metformin hcl) .... One by mouth twice a day 3)  Simvastatin 40 Mg Tabs (Simvastatin) .Marland Kitchen.. 1 1/2 by mouth once daily - due office visit 4)  Norvasc  10 Mg Tabs (Amlodipine besylate) .Marland Kitchen.. 1 by mouth once daily 5)  Allopurinol 300 Mg Tabs (Allopurinol) .... Take 1 tablet by mouth once a day 6)  Colchicine 0.6 Mg Tabs (Colchicine) .... Take 1 tablet by mouth two times a day and then decrease to daily as symptoms allow 7)  Indomethacin 25 Mg Caps (Indomethacin) .... Take 1 capsule 3 times a day.  can increase to 2 capsules three times a day as needed. 8)  Nasacort Aq 55 Mcg/act Aers (Triamcinolone acetonide(nasal)) .... 2 sprays each nostril qd 9)  Prilosec Otc 20 Mg Tbec (Omeprazole magnesium) .Marland Kitchen.. 1 before breakfast and one before dinner 10)  One Touch Ultra   .... Test strips lancets as directed dx 250.00 11)  Cyanocobalamin 1000 Mcg/ml Soln (Cyanocobalamin) .Marland Kitchen.. 1 ml monthly 12)  Vicodin 5-500 Mg Tabs (Hydrocodone-acetaminophen) .Marland Kitchen.. 1 tab by mouth q6 as needed for severe pain.  Patient Instructions: 1)  Follow up the end of Sept for your diabetes appt 2)  We'll notify you of your uric acid 3)  Increase the Colchicine to two times a day and then decrease to daily as your symptoms improve 4)  Continue the Indomethacin three times a day (you can increase to 2 pills three times a day during this acute flare) 5)  Vicodin as needed. 6)  Hang in there! Prescriptions: INDOMETHACIN 25 MG CAPS (INDOMETHACIN) take 1 capsule 3 times a day.  can increase to 2 capsules three times a day as needed.  #90 x 1   Entered and Authorized by:   Neena Rhymes MD   Signed by:   Neena Rhymes MD on 06/06/2010   Method used:   Print then Give to Patient   RxID:   [id number] COLCHICINE 0.6 MG TABS (COLCHICINE) Take 1 tablet by mouth two times a day and then decrease to daily as symptoms allow  #60 x 3   Entered and Authorized by:   Neena Rhymes MD   Signed by:   Neena Rhymes MD on 06/06/2010   Method used:   Print then Give to Patient   RxID:   [id number] VICODIN 5-500 MG TABS (HYDROCODONE-ACETAMINOPHEN) 1 tab by mouth Q6 as needed for severe pain.  #20 x 0   Entered and Authorized by:   Neena Rhymes MD   Signed by:   Neena Rhymes MD on 06/06/2010   Method used:   Print then Give to Patient   RxID:   [id number]

## 2010-12-10 NOTE — Assessment & Plan Note (Signed)
Summary: B12    Nurse Visit   Allergies: 1)  ! Prednisone  Orders Added: 1)  Venipuncture [46962] 2)  Specimen Handling [99000] 3)  TLB-B12 + Folate Pnl [[id number]-B12/FOL]

## 2010-12-10 NOTE — Assessment & Plan Note (Signed)
Summary: b12//kn   Nurse Visit   Allergies: 1)  ! 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

## 2010-12-10 NOTE — Letter (Signed)
Summary: Out of Work  [facility]  [address] [place], [place] [address]   Phone: [phone]  Fax: [phone]    October 16, 2010   Employee:  DONATHAN BULLER    To Whom It May Concern:   For Medical reasons, please excuse the above named employee from work for the following dates:  Start:   10/16/2010  End:   10/16/2010  If you need additional information, please feel free to contact our office.         Sincerely,    Doristine Devoid CMA

## 2010-12-10 NOTE — Assessment & Plan Note (Signed)
Summary: B12 SHOT/KDC   Nurse Visit   Allergies: 1)  ! 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

## 2010-12-10 NOTE — Assessment & Plan Note (Signed)
Summary: rt shoulder injured at work///sph--dont file to his insurance   Vital Signs:  Patient profile:   40 year old male Height:      71.5 inches Weight:      337 pounds BMI:     46.51 Pulse rate:   88 / minute BP sitting:   130 / 76  (left arm)  Vitals Entered By: Doristine Devoid CMA (October 16, 2010 11:03 AM) CC: R shoulder pain xlast night felt something tear trouble in moving arm    History of Present Illness: 40 yo man here today for R shoulder pain.  was lifting cloth-roll last night at [employer] (work) when he heard and felt something 'tear'.  area started swelling, now very painful- worse w/ movement.  took indomethacin w/out relief.  used ice w/out relief.  pt is L handed.  Current Medications (verified): 1)  Glimepiride 2 Mg Tabs (Glimepiride) .... One By Mouth Every Morning - Due Office Visit in May 2011 2)  Metformin Hcl 1000 Mg Tabs (Metformin Hcl) .... Take 1 Tab  Two Times A Day 3)  Simvastatin 40 Mg Tabs (Simvastatin) .Marland Kitchen.. 1 1/2 By Mouth Once Daily - Due Office Visit 4)  Norvasc 10 Mg Tabs (Amlodipine Besylate) .Marland Kitchen.. 1 By Mouth Once Daily 5)  Allopurinol 300 Mg Tabs (Allopurinol) .... Take 1 Tablet By Mouth Once A Day 6)  Colchicine 0.6 Mg Tabs (Colchicine) .... Take 1 Tablet By Mouth Two Times A Day and Then Decrease To Daily As Symptoms Allow 7)  Indomethacin 25 Mg Caps (Indomethacin) .... Take 1 Capsule 3 Times A Day.  Can Increase To 2 Capsules Three Times A Day As Needed. 8)  Nasacort Aq 55 Mcg/act Aers (Triamcinolone Acetonide(Nasal)) .... 2 Sprays Each Nostril Qd 9)  Prilosec Otc 20 Mg Tbec (Omeprazole Magnesium) .Marland Kitchen.. 1 Before Breakfast and One Before Dinner 10)  One Touch Ultra .... Test Strips Lancets As Directed Dx 250.00 11)  Cyanocobalamin 1000 Mcg/ml Soln (Cyanocobalamin) .Marland Kitchen.. 1 Ml Monthly 12)  Vicodin 5-500 Mg Tabs (Hydrocodone-Acetaminophen) .Marland Kitchen.. 1 Tab By Mouth Q6 As Needed For Severe Pain. 13)  Fenofibrate 160 Mg Tabs (Fenofibrate) .... Take 1 Tab Once  Daily 14)  Diovan 160 Mg Tabs (Valsartan) .Marland Kitchen.. 1 By Mouth Qd  Allergies (verified): 1)  ! Prednisone  Review of Systems      See HPI  Physical Exam  General:  alert, well-developed, and obese   Msk:  has full active ROM of R shoulder in forward flexion and abduction but pain at shoulder height, mild impingement signs.  somewhat limited internal rotation.  no TTP along clavicle, AC joint.  mild TTP over head of biceps tendon. Pulses:  +2 carotid, radial Extremities:  no C/C/E   Impression & Recommendations:  Problem # 1:  SHOULDER PAIN (ICD-719.41) given fact that pt heard tearing sound last night will refer to Ortho for complete w/u.  continue NSAIDs and pain meds.  Pt expresses understanding and is in agreement w/ this plan. His updated medication list for this problem includes:    Indomethacin 25 Mg Caps (Indomethacin) .Marland Kitchen... Take 1 capsule 3 times a day.  can increase to 2 capsules three times a day as needed.    Vicodin 5-500 Mg Tabs (Hydrocodone-acetaminophen) .Marland Kitchen... 1 tab by mouth q6 as needed for severe pain.  Orders: Orthopedic Referral (Ortho)  Complete Medication List: 1)  Glimepiride 2 Mg Tabs (Glimepiride) .... One by mouth every morning - due office visit in may 2011 2)  Metformin Hcl 1000 Mg Tabs (Metformin hcl) .... Take 1 tab  two times a day 3)  Simvastatin 40 Mg Tabs (Simvastatin) .Marland Kitchen.. 1 1/2 by mouth once daily - due office visit 4)  Norvasc 10 Mg Tabs (Amlodipine besylate) .Marland Kitchen.. 1 by mouth once daily 5)  Allopurinol 300 Mg Tabs (Allopurinol) .... Take 1 tablet by mouth once a day 6)  Colchicine 0.6 Mg Tabs (Colchicine) .... Take 1 tablet by mouth two times a day and then decrease to daily as symptoms allow 7)  Indomethacin 25 Mg Caps (Indomethacin) .... Take 1 capsule 3 times a day.  can increase to 2 capsules three times a day as needed. 8)  Nasacort Aq 55 Mcg/act Aers (Triamcinolone acetonide(nasal)) .... 2 sprays each nostril qd 9)  Prilosec Otc 20 Mg Tbec  (Omeprazole magnesium) .Marland Kitchen.. 1 before breakfast and one before dinner 10)  One Touch Ultra  .... Test strips lancets as directed dx 250.00 11)  Cyanocobalamin 1000 Mcg/ml Soln (Cyanocobalamin) .Marland Kitchen.. 1 ml monthly 12)  Vicodin 5-500 Mg Tabs (Hydrocodone-acetaminophen) .Marland Kitchen.. 1 tab by mouth q6 as needed for severe pain. 13)  Fenofibrate 160 Mg Tabs (Fenofibrate) .... Take 1 tab once daily 14)  Diovan 160 Mg Tabs (Valsartan) .Marland Kitchen.. 1 by mouth qd  Patient Instructions: 1)  We're working on getting your ortho appt 2)  Take the Vicodin as needed for pain 3)  Continue to ice 4)  Use the Indomethacin for inflammation 5)  Hang in there!! Prescriptions: VICODIN 5-500 MG TABS (HYDROCODONE-ACETAMINOPHEN) 1 tab by mouth Q6 as needed for severe pain.  #45 x 0   Entered and Authorized by:   Neena Rhymes MD   Signed by:   Neena Rhymes MD on 10/16/2010   Method used:   Print then Give to Patient   RxID:   [id number]    Orders Added: 1)  Orthopedic Referral [Ortho] 2)  Est. Patient Level III [25366]

## 2010-12-10 NOTE — Progress Notes (Signed)
Summary: ORTHOPAEDIC REFERRAL  Phone Note From Other Clinic   Caller: [facility] Summary of Call: IN REFERENCE TO ORTHO REFERRAL......SINCE CASE IS WORKMAN'S COMP, SPECIALISTS MUST CALL & GET APPROVAL FROM PT'S EMPLOYER PRIOR TO OFFERING PT APPT TIME, [facility] WILL CONTACT PT W/APPT TIME AFTER ABOVE Magdalen Spatz [facility]  October 16, 2010 11:36 AM  I JUST REC'D CALL FROM [facility] SHE CONTACTED PT'S EMPLOYER FOR APPROVAL & [employer] STATED THAT PATIENT COULD NOT COME TO THEM, THAT HE HAD TO GO TO THE EMPLOYER'S DOCTOR. SO PATIENT WAS SEEN TODAY FOR HIS INJURY. PATIENT WAS NOT AWARE THAT HE CAN CHOOSE WHOMEVER HE WISHES TO BE SEEN FOR HIS INJURY.   Initial call taken by: Magdalen Spatz [facility]  October 16, 2010 3:05 PM  Follow-up for Phone Call        i'm confused.... is he seeing the [employer] doctor?  is he going to ortho?  i don't care where he goes...as long as he is seen by ortho Follow-up by: Neena Rhymes MD,  October 16, 2010 3:19 PM  Additional Follow-up for Phone Call Additional follow up Details #1::        He is not going to orthopaedic as of yet.  His employer sent him to the Urgent Care Physician they use for employee injuries per the patient. Magdalen Spatz [facility]  October 16, 2010 4:11 PM     Additional Follow-up for Phone Call Additional follow up Details #2::    noted.  he will need to follow their policies and procedures. Follow-up by: Neena Rhymes MD,  October 16, 2010 4:12 PM

## 2010-12-10 NOTE — Progress Notes (Signed)
Summary: Prior Auth, form requested 3/14  Phone Note Refill Request Message from:  Pharmacy on [facility] [address] Fax#: [phone]  Refills Requested: Medication #1:  NASACORT AQ 55 MCG/ACT AERS 2 sprays each nostril qd   Dosage confirmed as above?Dosage Confirmed   Last Refilled: 01/21/2010 Prior Auth [phone] [employer] ID #: [id number] Grp: [facility]  Next Appointment Scheduled: none Initial call taken by: Harold Barban,  January 21, 2010 12:52 PM  Follow-up for Phone Call        prior auth form requested from [facility]  January 21, 2010 3:07 PM  prior auth form completed & faxed back to [facility]  January 22, 2010 2:03 PM  RECEIVED FAX FROM [employer]: Nasacort AQ has been approved from 01/01/2010 until 01/22/2012.   pharmacy advised Shary Decamp  January 23, 2010 9:51 AM

## 2010-12-10 NOTE — Medication Information (Signed)
Summary: Prior Authorization for Nasacort/[employer]  Prior Authorization for Nasacort/[employer]   Imported By: Lanelle Bal 01/31/2010 12:18:37  _____________________________________________________________________  External Attachment:    Type:   Image     Comment:   External Document

## 2010-12-10 NOTE — Assessment & Plan Note (Signed)
Summary: b-12 shot///sph   Nurse Visit   Allergies: 1)  ! 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.1)    Route: IM    Site: L deltoid    Exp Date: 01/09/2012    Lot #: [id number]    Mfr: American Regent    Given by: Doristine Devoid CMA (September 13, 2010 11:05 AM)  Orders Added: 1)  Vit B12 1000 mcg [J3420] 2)  Admin of Therapeutic Inj  intramuscular or subcutaneous [96372]   Medication Administration  Injection # 1:    Medication: Vit B12 1000 mcg    Diagnosis: OTHER VITAMIN B12 DEFICIENCY ANEMIA (ICD-281.1)    Route: IM    Site: L deltoid    Exp Date: 01/09/2012    Lot #: [id number]    Mfr: American Regent    Given by: Doristine Devoid CMA (September 13, 2010 11:05 AM)  Orders Added: 1)  Vit B12 1000 mcg [J3420] 2)  Admin of Therapeutic Inj  intramuscular or subcutaneous [19147]

## 2010-12-10 NOTE — Progress Notes (Signed)
Summary: NUTRITION REFERRAL  Phone Note Other Incoming   Summary of Call: IN REFERNECE TO NUTRITION/DIABETES REFERRAL......Marland KitchenPER FAX FROM [facility], PT CXLED APPT & DID NOT WANT TO RSC'D.   Initial call taken by: Magdalen Spatz [facility]  September 10, 2010 11:50 AM  Follow-up for Phone Call        noted Follow-up by: Neena Rhymes MD,  September 10, 2010 11:52 AM

## 2010-12-10 NOTE — Letter (Signed)
Summary: Patient Cancellation/[facility]  Patient Cancellation/[facility]   Imported By: Lanelle Bal 09/17/2010 09:39:34  _____________________________________________________________________  External Attachment:    Type:   Image     Comment:   External Document

## 2010-12-10 NOTE — Assessment & Plan Note (Signed)
Summary: diabetic check/cbs   Vital Signs:  Patient profile:   40 year old male Weight:      341 pounds Pulse rate:   94 / minute BP sitting:   140 / 88  (left arm)  Vitals Entered By: Doristine Devoid CMA (July 19, 2010 9:57 AM) CC: f/u on diabetes    History of Present Illness: 40 yo man here today for f/u on  1) DM- CBGs running 89-140.  most readings <130.  no recent symptomatic lows.  on metformin and glimepiride.  overdue for eye exam.  no CP, SOB, HAs, edema.  2) HTN- only on Norvasc.  asymptomatic.    3) Cholesterol- tolerating simvastatin w/out difficulty.  no N/V, abd pain, myalgias.  4) B12 deficiency- due for shot today  5) pulled muscle- R chest wall, mid-axillary line.  does a lot of heavy lifting at work- this is aggravating  6) Obesity- pt not following ADA diet or exercising regularly.  7) decreased libido- pt saw commercial for low T and wonders if this applies to him.  would like labs checked.  Preventive Screening-Counseling & Management  Caffeine-Diet-Exercise     Does Patient Exercise: no      Drug Use:  never.    Allergies (verified): 1)  ! Prednisone  Past History:  Past Medical History: Last updated: 08/17/2009 DIABETES Dx 08-2009 GERD Gout Hyperlipidemia Hypertension  Family History: Last updated: 09/26/2008 MI --father had a heart attack at age 39 colon cancer--no prostate cancer  no DM--F CHF--F  Social History: Last updated: 05/02/2010 Divorced,   remarried June 2011 two children Occupation: maintanence Tobacco-- no ETOH-- occasionally   Social History: Does Patient Exercise:  no Drug Use:  never  Review of Systems      See HPI  Physical Exam  General:  alert, well-developed, and obese   Head:  Normocephalic and atraumatic without obvious abnormalities. No apparent alopecia or balding. Neck:  no thyromegaly.   Chest Wall:  + TTP along R mid-axillary line Lungs:  normal respiratory effort, no intercostal  retractions, no accessory muscle use, and normal breath sounds.   Heart:  normal rate, regular rhythm, no murmur, and no gallop.   Abdomen:  soft, non-tender, no distention, no masses, no guarding, and no rigidity.   Pulses:  + 2 carotid, radial, DP Extremities:  no C/C/E Cervical Nodes:  No lymphadenopathy noted Psych:  Cognition and judgment appear intact. Alert and cooperative with normal attention span and concentration.  not anxious appearing and not depressed appearing.    Diabetes Management Exam:    Foot Exam (with socks and/or shoes not present):       Sensory-Pinprick/Light touch:          Left medial foot (L-4): normal          Left dorsal foot (L-5): normal          Left lateral foot (S-1): normal          Right medial foot (L-4): normal          Right dorsal foot (L-5): normal          Right lateral foot (S-1): normal       Sensory-Monofilament:          Left foot: normal          Right foot: normal       Inspection:          Left foot: normal  Right foot: normal       Nails:          Left foot: thickened          Right foot: thickened   Impression & Recommendations:  Problem # 1:  DM (ICD-250.00) Assessment Unchanged Pt did not bring meter or log.  will check A1C and adjust meds as needed.  overdue for eye exam, referral made.  pt has never been to nutrition- will refer. His updated medication list for this problem includes:    Glimepiride 2 Mg Tabs (Glimepiride) ..... One by mouth every morning - due office visit in may 2011    Metformin Hcl 1000 Mg Tabs (Metformin hcl) .Marland Kitchen... Take 1 tab  two times a day    Lisinopril 10 Mg Tabs (Lisinopril) .Marland Kitchen... Take 1 tab by mouth daily  Orders: Venipuncture ([address]) Nutrition Referral (Nutrition) Ophthalmology Referral (Ophthalmology) Specimen Handling (13086) TLB-A1C / Hgb A1C (Glycohemoglobin) (83036-A1C) TLB-BMP (Basic Metabolic Panel-BMET) (80048-METABOL)  Problem # 2:  HYPERTENSION (ICD-401.9) Assessment:  Unchanged BP control adequate but not ideal.  add lisinopril for both BP control and renal protection.  will follow closely. His updated medication list for this problem includes:    Norvasc 10 Mg Tabs (Amlodipine besylate) .Marland Kitchen... 1 by mouth once daily    Lisinopril 10 Mg Tabs (Lisinopril) .Marland Kitchen... Take 1 tab by mouth daily  Problem # 3:  HYPERLIPIDEMIA (ICD-272.4) Assessment: Unchanged due for lab check.  adjust meds as needed. His updated medication list for this problem includes:    Simvastatin 40 Mg Tabs (Simvastatin) .Marland Kitchen... 1 1/2 by mouth once daily - due office visit    Fenofibrate 160 Mg Tabs (Fenofibrate) .Marland Kitchen... Take 1 tab once daily  Orders: Specimen Handling (57846) TLB-Lipid Panel (80061-LIPID) TLB-Hepatic/Liver Function Pnl (80076-HEPATIC)  Problem # 4:  VITAMIN B12 DEFICIENCY (ICD-266.2) Assessment: New pt due for an injxn today.  will repeat labs at upcoming visit.  Problem # 5:  STRAIN, CHEST WALL (ICD-848.8) Assessment: New pt's hx and PE consistent w/ muscle strain.  tx w/ ice/heat, already on NSAIDs.  Problem # 6:  OBESITY (ICD-278.00) Assessment: New pt continues to gain weight.  stressed the importance of ADA diet, regular exercise.  will refer to nutrition.  Problem # 7:  DECREASED LIBIDO (ICD-799.81) Assessment: New check testosterone- tx as needed. Orders: Specimen Handling (96295) TLB-Testosterone, Total (84403-TESTO)  Complete Medication List: 1)  Glimepiride 2 Mg Tabs (Glimepiride) .... One by mouth every morning - due office visit in may 2011 2)  Metformin Hcl 1000 Mg Tabs (Metformin hcl) .... Take 1 tab  two times a day 3)  Simvastatin 40 Mg Tabs (Simvastatin) .Marland Kitchen.. 1 1/2 by mouth once daily - due office visit 4)  Norvasc 10 Mg Tabs (Amlodipine besylate) .Marland Kitchen.. 1 by mouth once daily 5)  Allopurinol 300 Mg Tabs (Allopurinol) .... Take 1 tablet by mouth once a day 6)  Colchicine 0.6 Mg Tabs (Colchicine) .... Take 1 tablet by mouth two times a day and then  decrease to daily as symptoms allow 7)  Indomethacin 25 Mg Caps (Indomethacin) .... Take 1 capsule 3 times a day.  can increase to 2 capsules three times a day as needed. 8)  Nasacort Aq 55 Mcg/act Aers (Triamcinolone acetonide(nasal)) .... 2 sprays each nostril qd 9)  Prilosec Otc 20 Mg Tbec (Omeprazole magnesium) .Marland Kitchen.. 1 before breakfast and one before dinner 10)  One Touch Ultra  .... Test strips lancets as directed dx 250.00 11)  Cyanocobalamin 1000 Mcg/ml  Soln (Cyanocobalamin) .Marland Kitchen.. 1 ml monthly 12)  Vicodin 5-500 Mg Tabs (Hydrocodone-acetaminophen) .Marland Kitchen.. 1 tab by mouth q6 as needed for severe pain. 13)  Lisinopril 10 Mg Tabs (Lisinopril) .... Take 1 tab by mouth daily 14)  Fenofibrate 160 Mg Tabs (Fenofibrate) .... Take 1 tab once daily  Other Orders: Vit B12 1000 mcg (J3420) Admin of Therapeutic Inj  intramuscular or subcutaneous (09811)  Patient Instructions: 1)  Please schedule a follow-up appointment in 1 month to recheck BP. 2)  Start the Lisinopril daily 3)  We'll notify you of your lab results 4)  Someone will call you with your eye exam and nutrition appts 5)  Use a heating pad on your chest for a pulled muscle- this will take time to heal 6)  We'll make any med adjustments after we have your labs 7)  Have a great weekend!  Prescriptions: LISINOPRIL 10 MG  TABS (LISINOPRIL) Take 1 tab by mouth daily  #30 x 3   Entered and Authorized by:   Neena Rhymes MD   Signed by:   Neena Rhymes MD on 07/19/2010   Method used:   Electronically to        [facility] [phone]* (retail)       [address]       [place], [place]  [address]       Ph: [phone]       Fax: [phone]   RxID:   [id number]    Medication Administration  Injection # 1:    Medication: Vit B12 1000 mcg    Diagnosis: OTHER VITAMIN B12 DEFICIENCY ANEMIA (ICD-281.1)    Route: IM    Site: R deltoid    Exp Date: 01/2012    Lot #: [id number]    Mfr: American Regent    Patient tolerated injection  without complications    Given by: Army Fossa CMA (July 19, 2010 10:40 AM)  Orders Added: 1)  Venipuncture [34742] 2)  Nutrition Referral [Nutrition] 3)  Ophthalmology Referral [Ophthalmology] 4)  Specimen Handling [99000] 5)  Vit B12 1000 mcg [J3420] 6)  Admin of Therapeutic Inj  intramuscular or subcutaneous [96372] 7)  TLB-A1C / Hgb A1C (Glycohemoglobin) [83036-A1C] 8)  TLB-BMP (Basic Metabolic Panel-BMET) [80048-METABOL] 9)  TLB-Lipid Panel [80061-LIPID] 10)  TLB-Hepatic/Liver Function Pnl [80076-HEPATIC] 11)  TLB-Testosterone, Total [84403-TESTO] 12)  Est. Patient Level IV [59563]

## 2010-12-10 NOTE — Letter (Signed)
Summary: Primary Care Appointment Letter  [facility] [place]  [address] [place], [place] [address]   Phone: [phone]  Fax: [phone]    03/12/2010 MRN: [id number]  Aaron Franco [address] [place], [place]  [address]  Dear Mr. CLINKSCALE,   Your Primary Care Physician Cromwell E. Paz MD has indicated that:    __x_____it is time to schedule an appointment.  Please call our office @ [phone] to schedule an office visit with Dr. Drue Novel.     Thank you,    [facility] Scheduler

## 2010-12-10 NOTE — Progress Notes (Signed)
Summary: Norvasc refill  Phone Note Refill Request Call back at [phone] Message from:  Pharmacy on June 25, 2010 8:23 AM  Refills Requested: Medication #1:  NORVASC 10 MG TABS 1 by mouth once daily   Dosage confirmed as above?Dosage Confirmed   Supply Requested: 1 month   Last Refilled: 05/30/2010   Notes: NO CURRENT RX ON FILE-PLEASE FAX AUTH [facility] [address]  Next Appointment Scheduled: 08/13/10 Initial call taken by: Lavell Islam,  June 25, 2010 8:24 AM    Prescriptions: NORVASC 10 MG TABS (AMLODIPINE BESYLATE) 1 by mouth once daily  #30 Tablet x 11   Entered by:   Lucious Groves CMA   Authorized by:   Neena Rhymes MD   Signed by:   Lucious Groves CMA on 06/25/2010   Method used:   Faxed to ...       [facility]  [address] [phone]* (retail)       [address]       [place], [place]  [address]       Ph: [phone]       Fax: [phone]   RxID:   [id number]

## 2010-12-10 NOTE — Progress Notes (Signed)
Summary: med concerns  Phone Note Call from Patient Call back at [facility] Phone [phone]   Caller: Patient Summary of Call: Pt left VM that he had some concerns about med that was changed do to a cough. Pt would like a return call to discuss further. left message to call office..................Marland KitchenFelecia Deloach CMA  August 05, 2010 10:17 AM   Follow-up for Phone Call        Patient states that he still has the cough despite changing meds and it is quite an issue. Patient is aware that MD will return on Thurs. and we will call with her recommedations. Lucious Groves CMA  August 05, 2010 10:19 AM   Additional Follow-up for Phone Call Additional follow up Details #1::        it can take 2-4 weeks for the cough to completely resolve.  he called 3-4 days after switching meds.  as long as he is feeling well and doesn't have fever the cough should continue to improve.  if he is having other sxs the cough is not medication related.  he needs to give this more time Additional Follow-up by: Neena Rhymes MD,  August 07, 2010 9:06 AM    Additional Follow-up for Phone Call Additional follow up Details #2::    Left message on voicemail to call back to office. Lucious Groves CMA  August 07, 2010 1:28 PM   Patient notified. Lucious Groves CMA  August 08, 2010 9:55 AM

## 2010-12-10 NOTE — Progress Notes (Signed)
Summary: Diovan samples  Phone Note Call from Patient Call back at [facility] Phone [phone]   Summary of Call: Patient called for samples of Diovan while he sees if it works for him.   Patient notified samples are up front ready for pickup. (#28) Initial call taken by: Lucious Groves CMA,  August 12, 2010 12:40 PM    Prescriptions: DIOVAN 160 MG TABS (VALSARTAN) 1 by mouth qd  #28 x 0   Entered by:   Lucious Groves CMA   Authorized by:   Neena Rhymes MD   Signed by:   Lucious Groves CMA on 08/12/2010   Method used:   Samples Given   RxID:   [id number]

## 2010-12-10 NOTE — Letter (Signed)
Summary: Work Dietitian at [employer]  [address] [place], [place] [address]   Phone: [phone]  Fax: [phone]    Today's Date: July 19, 2010  Name of Patient: Aaron Franco  The above named patient had a medical visit today at: 9:40 am .  Please take this into consideration when reviewing the time away from work/school.    Special Instructions:  [  ] None  [  ] To be off the remainder of today, returning to the normal work / school schedule tomorrow.  [  ] To be off until the next scheduled appointment on ______________________.  [ x ] Other - pt to have light duty work due to a pulled muscle in his chest for the next 2 weeks  Sincerely yours,   Neena Rhymes MD

## 2010-12-10 NOTE — Assessment & Plan Note (Signed)
Summary: followup//kn   Vital Signs:  Patient profile:   40 year old male Height:      71.5 inches Weight:      332 pounds Temp:     98.9 degrees F oral Pulse rate:   72 / minute BP sitting:   130 / 76  (left arm)  Vitals Entered By: Jeremy Johann CMA (May 02, 2010 11:02 AM) CC: f/u Comments --fasting --labs --refill --b12 injection REVIEWED MED LIST, PATIENT AGREED DOSE AND INSTRUCTION CORRECT    History of Present Illness: last OV 5 months ago, here with his wife feeling well, got married last week   DIABETES-- ambulatory CBGs in the low 100s  B12 def --poor compliance with shots Gout-- no recent attacks  Hyperlipidemia-good medication compliance  Hypertension-- ambulatory BPs 120s/70s    Allergies: 1)  ! Prednisone  Past History:  Past Medical History: Reviewed history from 08/17/2009 and no changes required. DIABETES Dx 08-2009 GERD Gout Hyperlipidemia Hypertension  Past Surgical History: Reviewed history from 11/23/2009 and no changes required. L hand nerve repair after a cut  (1990s)  Social History: Divorced,   remarried June 2011 two children Occupation: maintanence Tobacco-- no ETOH-- occasionally   Review of Systems General:  in the last few months he is not exercising and not following a healthy diet. ENT:  constant nasal congestion and "blowing his nose" but no discharge is noted.  slightly itchy eyes, no itchy nose, no sneezing Not using his nasal steroids. CV:  no chest pain or shortness of breath. GI:  no nausea, vomiting, diarrhea.  Physical Exam  General:  alert, well-developed, and overweight-appearing.   Head:  face symmetric, not tender to palpation Ears:  R ear normal and L ear TMs slightly bulged  Nose:  slightly congested, turbinates slightly red and swollen Lungs:  normal respiratory effort, no intercostal retractions, no accessory muscle use, and normal breath sounds.   Heart:  normal rate, regular rhythm, no murmur,  and no gallop.     Impression & Recommendations:  Problem # 1:  OTHER VITAMIN B12 DEFICIENCY ANEMIA (ICD-281.1) recommend to restart shots His updated medication list for this problem includes:    Cyanocobalamin 1000 Mcg/ml Soln (Cyanocobalamin) .Marland Kitchen... 1 ml monthly  Problem # 2:  DM (ICD-250.00)  good medication compliance but poor lifestyle in the last few months. Labs His updated medication list for this problem includes:    Glimepiride 2 Mg Tabs (Glimepiride) ..... One by mouth every morning - due office visit in may 2011    Metformin Hcl 500 Mg Tabs (Metformin hcl) ..... One by mouth twice a day  Labs Reviewed: Creat: 0.8 (10/16/2009)    Reviewed HgBA1c results: 6.6 (11/23/2009)  11.1 (08/17/2009)  Orders: Venipuncture (16109) TLB-A1C / Hgb A1C (Glycohemoglobin) (83036-A1C)  Problem # 3:  HYPERTENSION (ICD-401.9) well controlled His updated medication list for this problem includes:    Norvasc 10 Mg Tabs (Amlodipine besylate) .Marland Kitchen... 1 by mouth once daily  BP today: 130/76 Prior BP: 130/80 (11/23/2009)  Labs Reviewed: K+: 4.3 (10/16/2009) Creat: : 0.8 (10/16/2009)   Chol: 181 (11/23/2009)   HDL: 32 (11/23/2009)   LDL: 114 (11/23/2009)   TG: 176 (11/23/2009)  Problem # 4:  HYPERLIPIDEMIA (ICD-272.4)  good medication compliance but  poor lifestyle for the last few months Labs His updated medication list for this problem includes:    Simvastatin 40 Mg Tabs (Simvastatin) .Marland Kitchen... 1 1/2 by mouth once daily - due office visit  Labs Reviewed: SGOT: 40 (08/17/2009)  SGPT: 41 (08/17/2009)   HDL:32 (11/23/2009), 38.8 (09/26/2008)  LDL:114 (11/23/2009), DEL (09/26/2008)  Chol:181 (11/23/2009), 210 (09/26/2008)  Trig:176 (11/23/2009), 201 (09/26/2008)  Orders: TLB-Lipid Panel (80061-LIPID) TLB-ALT (SGPT) (84460-ALT) TLB-AST (SGOT) (84450-SGOT)  Problem # 5:  RHINITIS (ICD-477.9) continue with symptoms of allergic rhinitis, to restart nasal steroids His updated medication  list for this problem includes:    Nasacort Aq 55 Mcg/act Aers (Triamcinolone acetonide(nasal)) .Marland Kitchen... 2 sprays each nostril qd  Complete Medication List: 1)  Glimepiride 2 Mg Tabs (Glimepiride) .... One by mouth every morning - due office visit in may 2011 2)  Metformin Hcl 500 Mg Tabs (Metformin hcl) .... One by mouth twice a day 3)  Simvastatin 40 Mg Tabs (Simvastatin) .Marland Kitchen.. 1 1/2 by mouth once daily - due office visit 4)  Norvasc 10 Mg Tabs (Amlodipine besylate) .Marland Kitchen.. 1 by mouth once daily 5)  Allopurinol 300 Mg Tabs (Allopurinol) .... Take 1 tablet by mouth once a day 6)  Colchicine 0.6 Mg Tabs (Colchicine) .... Take 1 tablet by mouth once a day 7)  Indomethacin 25 Mg Caps (Indomethacin) .... Take 1 capsule 3 times a day 8)  Nasacort Aq 55 Mcg/act Aers (Triamcinolone acetonide(nasal)) .... 2 sprays each nostril qd 9)  Prilosec Otc 20 Mg Tbec (Omeprazole magnesium) .Marland Kitchen.. 1 before breakfast and one before dinner 10)  One Touch Ultra  .... Test strips lancets as directed dx 250.00 11)  Cyanocobalamin 1000 Mcg/ml Soln (Cyanocobalamin) .Marland Kitchen.. 1 ml monthly  Patient Instructions: 1)  go back to your healthier diet 2)  Exercise daily for 30 minutes if possible. if not, then 3 hours a week 3)  Please schedule a follow-up appointment in 4 months .  Prescriptions: NORVASC 10 MG TABS (AMLODIPINE BESYLATE) 1 by mouth once daily  #30 Tablet x 12   Entered and Authorized by:   Anuoluwapo Mefferd E. Martia Dalby MD   Signed by:   Nolon Rod. Barnaby Rippeon MD on 05/02/2010   Method used:   Print then Give to Patient   RxID:   [id number] NASACORT AQ 55 MCG/ACT AERS (TRIAMCINOLONE ACETONIDE(NASAL)) 2 sprays each nostril qd  #1 x 6   Entered and Authorized by:   Nolon Rod. Kadyn Chovan MD   Signed by:   Nolon Rod. Pollie Poma MD on 05/02/2010   Method used:   Print then Give to Patient   RxID:   [id number]

## 2010-12-10 NOTE — Progress Notes (Signed)
Summary: labs   Phone Note Outgoing Call   Call placed by: Doristine Devoid CMA,  June 07, 2010 2:39 PM Call placed to: Patient Summary of Call: Uric acid level is normal.  if pt's foot continues to hurt he will need an xray to r/o stress fx of foot.  Follow-up for Phone Call        spoke w/ patient says he foot feels somewhat better but still w/ some pain informed patient to call back on monday if still pain to evaluate for fracture........Marland KitchenDoristine Devoid CMA  June 07, 2010 2:40 PM

## 2010-12-10 NOTE — Progress Notes (Signed)
Summary: Aleternate med  Phone Note Call from Patient Call back at [facility] Phone [phone]   Summary of Call: Patient left message on triage that he stopped Lisinopril due to cough and increased phlem. Patient notes that this was discussed in office, so he would alternative. Please advise.  Initial call taken by: Lucious Groves CMA,  August 01, 2010 11:17 AM  Follow-up for Phone Call        can switch to Diovan 160mg  daily, #30, 3 refills.  please provide coupon and samples if available Follow-up by: Neena Rhymes MD,  August 01, 2010 11:47 AM  Additional Follow-up for Phone Call Additional follow up Details #1::        Patient notified of the above and will call back after trying 2 weeks of samples if prescription is desired. Additional Follow-up by: Lucious Groves CMA,  August 01, 2010 11:57 AM    New/Updated Medications: DIOVAN 160 MG TABS (VALSARTAN) 1 by mouth qd Prescriptions: DIOVAN 160 MG TABS (VALSARTAN) 1 by mouth qd  #14 x 0   Entered by:   Lucious Groves CMA   Authorized by:   Neena Rhymes MD   Signed by:   Lucious Groves CMA on 08/01/2010   Method used:   Samples Given   RxID:   [id number]

## 2010-12-12 ENCOUNTER — Telehealth (INDEPENDENT_AMBULATORY_CARE_PROVIDER_SITE_OTHER): Payer: Self-pay | Admitting: *Deleted

## 2010-12-12 NOTE — Assessment & Plan Note (Signed)
Summary: FOLLOW UP AND BP CHECK/KB   Vital Signs:  Patient profile:   40 year old male Weight:      345 pounds BMI:     47.62 Pulse rate:   94 / minute BP sitting:   130 / 80  (left arm)  Vitals Entered By: Doristine Devoid CMA (October 28, 2010 11:05 AM) CC: f/u on bp and diabetesv    History of Present Illness: 40 yo man here today for f/u on   1) HTN- adequate control today.  on Norvasc and Diovan.  no CP, SOB, HAs, edema.  some visual changes- overdue for eye exam  2) DM- AM sugar 169.  reports AM sugars usually run 120-140.  on Glimepiride and metformin.  not taking metformin regularly at night.  not exercising.  now open to idea of nutritionist.  no symptomatic lows.  3) Hyperlipidemia- started on Fenofibrate at last visit due to elevated trigs.  stopped eating fried food.   no abd pain, myalgias, N/V.  Current Medications (verified): 1)  Glimepiride 2 Mg Tabs (Glimepiride) .... One By Mouth Every Morning - Due Office Visit in May 2011 2)  Metformin Hcl 1000 Mg Tabs (Metformin Hcl) .... Take 1 Tab  Two Times A Day 3)  Simvastatin 40 Mg Tabs (Simvastatin) .Marland Kitchen.. 1 1/2 By Mouth Once Daily - Due Office Visit 4)  Norvasc 10 Mg Tabs (Amlodipine Besylate) .Marland Kitchen.. 1 By Mouth Once Daily 5)  Allopurinol 300 Mg Tabs (Allopurinol) .... Take 1 Tablet By Mouth Once A Day 6)  Colchicine 0.6 Mg Tabs (Colchicine) .... Take 1 Tablet By Mouth Two Times A Day and Then Decrease To Daily As Symptoms Allow 7)  Indomethacin 25 Mg Caps (Indomethacin) .... Take 1 Capsule 3 Times A Day.  Can Increase To 2 Capsules Three Times A Day As Needed. 8)  Nasacort Aq 55 Mcg/act Aers (Triamcinolone Acetonide(Nasal)) .... 2 Sprays Each Nostril Qd 9)  Prilosec Otc 20 Mg Tbec (Omeprazole Magnesium) .Marland Kitchen.. 1 Before Breakfast and One Before Dinner 10)  One Touch Ultra .... Test Strips Lancets As Directed Dx 250.00 11)  Cyanocobalamin 1000 Mcg/ml Soln (Cyanocobalamin) .Marland Kitchen.. 1 Ml Monthly 12)  Vicodin 5-500 Mg Tabs  (Hydrocodone-Acetaminophen) .Marland Kitchen.. 1 Tab By Mouth Q6 As Needed For Severe Pain. 13)  Fenofibrate 160 Mg Tabs (Fenofibrate) .... Take 1 Tab Once Daily 14)  Diovan 160 Mg Tabs (Valsartan) .Marland Kitchen.. 1 By Mouth Qd  Allergies (verified): 1)  ! Prednisone  Review of Systems      See HPI  Physical Exam  General:  alert, well-developed, and obese   Neck:  no thyromegaly.   Lungs:  normal respiratory effort, no intercostal retractions, no accessory muscle use, and normal breath sounds.   Heart:  normal rate, regular rhythm, no murmur, and no gallop.   Abdomen:  soft, NT/ND, +BS, obese Pulses:  +2 carotid, radial, DP Extremities:  no C/C/E   Impression & Recommendations:  Problem # 1:  HYPERTENSION (ICD-401.9) Assessment Unchanged BP adequately controlled.  no changes in meds at this time. His updated medication list for this problem includes:    Norvasc 10 Mg Tabs (Amlodipine besylate) .Marland Kitchen... 1 by mouth once daily    Diovan 160 Mg Tabs (Valsartan) .Marland Kitchen... 1 by mouth qd  Problem # 2:  DM (ICD-250.00) Assessment: Unchanged discussed importance of diet and exercise at length.  not taking metformin regularly.  stressed importance of med compliance.  overdue on eye exam.  pt to schedule.  check labs and  adjust meds as needed.  refer to nutrition. His updated medication list for this problem includes:    Glimepiride 2 Mg Tabs (Glimepiride) ..... One by mouth every morning - due office visit in may 2011    Metformin Hcl 1000 Mg Tabs (Metformin hcl) .Marland Kitchen... Take 1 tab  two times a day    Diovan 160 Mg Tabs (Valsartan) .Marland Kitchen... 1 by mouth qd  Orders: Specimen Handling (81191) TLB-A1C / Hgb A1C (Glycohemoglobin) (83036-A1C) TLB-BMP (Basic Metabolic Panel-BMET) (80048-METABOL) Nutrition Referral (Nutrition)  Problem # 3:  HYPERLIPIDEMIA (ICD-272.4) Assessment: Unchanged pt tolerating fenofibrate w/out difficulty.  check labs to assess improvement. His updated medication list for this problem includes:     Lipitor 40 Mg Tabs (Atorvastatin calcium) .Marland Kitchen... Take one tablet at bedtime    Fenofibrate 160 Mg Tabs (Fenofibrate) .Marland Kitchen... Take 1 tab once daily  Orders: Venipuncture (47829) Specimen Handling (56213) TLB-Lipid Panel (80061-LIPID) TLB-Hepatic/Liver Function Pnl (80076-HEPATIC)  Complete Medication List: 1)  Glimepiride 2 Mg Tabs (Glimepiride) .... One by mouth every morning - due office visit in may 2011 2)  Metformin Hcl 1000 Mg Tabs (Metformin hcl) .... Take 1 tab  two times a day 3)  Lipitor 40 Mg Tabs (Atorvastatin calcium) .... Take one tablet at bedtime 4)  Norvasc 10 Mg Tabs (Amlodipine besylate) .Marland Kitchen.. 1 by mouth once daily 5)  Allopurinol 300 Mg Tabs (Allopurinol) .... Take 1 tablet by mouth once a day 6)  Colchicine 0.6 Mg Tabs (Colchicine) .... Take 1 tablet by mouth two times a day and then decrease to daily as symptoms allow 7)  Indomethacin 25 Mg Caps (Indomethacin) .... Take 1 capsule 3 times a day.  can increase to 2 capsules three times a day as needed. 8)  Nasacort Aq 55 Mcg/act Aers (Triamcinolone acetonide(nasal)) .... 2 sprays each nostril qd 9)  Prilosec Otc 20 Mg Tbec (Omeprazole magnesium) .Marland Kitchen.. 1 before breakfast and one before dinner 10)  One Touch Ultra  .... Test strips lancets as directed dx 250.00 11)  Cyanocobalamin 1000 Mcg/ml Soln (Cyanocobalamin) .Marland Kitchen.. 1 ml monthly 12)  Vicodin 5-500 Mg Tabs (Hydrocodone-acetaminophen) .Marland Kitchen.. 1 tab by mouth q6 as needed for severe pain. 13)  Fenofibrate 160 Mg Tabs (Fenofibrate) .... Take 1 tab once daily 14)  Diovan 160 Mg Tabs (Valsartan) .Marland Kitchen.. 1 by mouth qd  Patient Instructions: 1)  Please schedule a follow-up appointment in 3 months to recheck diabetes. 2)  Someone will call you with your nutrition appt 3)  We'll notify you of your lab results and make any changes if needed 4)  Try and make healthy food choices 5)  Exercise as you are able 6)  Hang in there! 7)  Happy Holidays!    Orders Added: 1)  Venipuncture  [08657] 2)  Specimen Handling [99000] 3)  TLB-Lipid Panel [80061-LIPID] 4)  TLB-Hepatic/Liver Function Pnl [80076-HEPATIC] 5)  TLB-A1C / Hgb A1C (Glycohemoglobin) [83036-A1C] 6)  TLB-BMP (Basic Metabolic Panel-BMET) [80048-METABOL] 7)  Nutrition Referral [Nutrition] 8)  Est. Patient Level IV [84696]

## 2010-12-12 NOTE — Progress Notes (Signed)
Summary: labs  Phone Note Outgoing Call   Call placed by: Doristine Devoid CMA,  October 29, 2010 1:55 PM Call placed to: Patient Summary of Call: triglycerides have improved.  cholesterol has improved.  still not at goal due to the fact he has diabetes.  should switch to lipitor 40 mg (now generic) nightly.  A1C has also improved but is not yet to goal.  should work really hard at taking his PM dose of metformin regularly.  no new meds at this time.  will follow in 3 months.   Follow-up for Phone Call        spoke w/ patient aware of labs and that medication is to be switched ...Marland KitchenMarland KitchenDoristine Devoid CMA  October 29, 2010 1:56 PM     New/Updated Medications: LIPITOR 40 MG TABS (ATORVASTATIN CALCIUM) take one tablet at bedtime Prescriptions: LIPITOR 40 MG TABS (ATORVASTATIN CALCIUM) take one tablet at bedtime  #30 x 5   Entered by:   Doristine Devoid CMA   Authorized by:   Neena Rhymes MD   Signed by:   Doristine Devoid CMA on 10/29/2010   Method used:   Electronically to        [facility] [phone]* (retail)       [address]       [place], [place]  [address]       Ph: [phone]       Fax: [phone]   RxID:   [id number]

## 2010-12-12 NOTE — Letter (Signed)
Summary: Out of Work  [facility] [employer]  [address] [place], [place] [address]   Phone: [phone]  Fax: [phone]    October 17, 2010   Employee:  ARSLAN KIER    To Whom It May Concern:   For Medical reasons, please excuse the above named employee from work for the following dates:  Start:   10/16/10  End:   10/18/10  If you need additional information, please feel free to contact our office.         Sincerely,    Doristine Devoid CMA

## 2010-12-12 NOTE — Progress Notes (Signed)
Summary: refill  Phone Note Refill Request Call back at [facility] Phone [phone]   Refills Requested: Medication #1:  INDOMETHACIN 25 MG CAPS take 1 capsule 3 times a day.  can increase to 2 capsules three times a day as needed.  Pt also requesting samples of PRILOSEC OTC 20 MG,DIOVAN 160 MG,NASACORT AQ 55 MCG/ACT...............Marland KitchenFelecia Deloach CMA  November 27, 2010 2:50 PM      Prescriptions: INDOMETHACIN 25 MG CAPS (INDOMETHACIN) take 1 capsule 3 times a day.  can increase to 2 capsules three times a day as needed.  #30 Capsule x 1   Entered by:   Doristine Devoid CMA   Authorized by:   Neena Rhymes MD   Signed by:   Doristine Devoid CMA on 11/27/2010   Method used:   Electronically to        [facility] [phone]* (retail)       [address]       [place], [place]  [address]       Ph: [phone]       Fax: [phone]   RxID:   [id number]

## 2010-12-18 NOTE — Progress Notes (Signed)
Summary: refill  Phone Note Refill Request Message from:  Patient on December 12, 2010 2:04 PM  Refills Requested: Medication #1:  INDOMETHACIN 25 MG CAPS take 1 capsule 3 times a day.  can increase to 2 capsules three times a day as needed. [facility] [address] - fax [phone] -- phone [phone]  Initial call taken by: Okey Regal [place],  December 12, 2010 2:04 PM    Prescriptions: INDOMETHACIN 25 MG CAPS (INDOMETHACIN) take 1 capsule 3 times a day.  can increase to 2 capsules three times a day as needed.  #30 Capsule x 1   Entered by:   Doristine Devoid CMA   Authorized by:   Neena Rhymes MD   Signed by:   Doristine Devoid CMA on 12/12/2010   Method used:   Electronically to        [facility] [address] #[id number]* (retail)       [address]       [place], [place]  [address]       Ph: [phone]       Fax: [phone]   RxID:   [id number]

## 2010-12-23 ENCOUNTER — Emergency Department (HOSPITAL_BASED_OUTPATIENT_CLINIC_OR_DEPARTMENT_OTHER)
Admission: EM | Admit: 2010-12-23 | Discharge: 2010-12-24 | Disposition: A | Discharge status: Home / Self Care | Payer: Federal, State, Local not specified - PPO | Attending: Emergency Medicine | Admitting: Emergency Medicine

## 2010-12-23 ENCOUNTER — Emergency Department (INDEPENDENT_AMBULATORY_CARE_PROVIDER_SITE_OTHER): Payer: Federal, State, Local not specified - PPO

## 2010-12-23 DIAGNOSIS — Z79899 Other long term (current) drug therapy: Secondary | ICD-10-CM | POA: Insufficient documentation

## 2010-12-23 DIAGNOSIS — E785 Hyperlipidemia, unspecified: Secondary | ICD-10-CM | POA: Insufficient documentation

## 2010-12-23 DIAGNOSIS — R079 Chest pain, unspecified: Secondary | ICD-10-CM | POA: Insufficient documentation

## 2010-12-23 DIAGNOSIS — E119 Type 2 diabetes mellitus without complications: Secondary | ICD-10-CM | POA: Insufficient documentation

## 2010-12-23 DIAGNOSIS — R0602 Shortness of breath: Secondary | ICD-10-CM

## 2010-12-23 LAB — CBC
HCT: 38.1 % — ABNORMAL LOW (ref 39.0–52.0)
Hemoglobin: 12.7 g/dL — ABNORMAL LOW (ref 13.0–17.0)
MCH: 26.3 pg (ref 26.0–34.0)
MCHC: 33.3 g/dL (ref 30.0–36.0)
MCV: 79 fL (ref 78.0–100.0)
Platelets: 355 10*3/uL (ref 150–400)
RBC: 4.82 MIL/uL (ref 4.22–5.81)
RDW: 13.4 % (ref 11.5–15.5)
WBC: 9.3 10*3/uL (ref 4.0–10.5)

## 2010-12-23 LAB — POCT CARDIAC MARKERS
CKMB, poc: 2.2 ng/mL (ref 1.0–8.0)
Myoglobin, poc: 121 ng/mL (ref 12–200)
Troponin i, poc: <0.05 ng/mL (ref 0.00–0.09)

## 2010-12-23 LAB — DIFFERENTIAL
Basophils Absolute: 0 10*3/uL (ref 0.0–0.1)
Basophils Relative: 0 % (ref 0–1)
Eosinophils Absolute: 0.1 10*3/uL (ref 0.0–0.7)
Eosinophils Relative: 2 % (ref 0–5)
Lymphocytes Relative: 36 % (ref 12–46)
Lymphs Abs: 3.3 10*3/uL (ref 0.7–4.0)
Monocytes Absolute: 0.9 10*3/uL (ref 0.1–1.0)
Monocytes Relative: 9 % (ref 3–12)
Neutro Abs: 5 10*3/uL (ref 1.7–7.7)
Neutrophils Relative %: 53 % (ref 43–77)

## 2010-12-23 LAB — BASIC METABOLIC PANEL
BUN: 15 mg/dL (ref 6–23)
CO2: 24 mEq/L (ref 19–32)
Calcium: 10.5 mg/dL (ref 8.4–10.5)
Chloride: 104 mEq/L (ref 96–112)
Creatinine, Ser: 0.9 mg/dL (ref 0.4–1.5)
GFR calc Af Amer: >60 mL/min (ref >60)
GFR calc non Af Amer: >60 mL/min (ref >60)
Glucose, Bld: 101 mg/dL — ABNORMAL HIGH (ref 70–99)
Potassium: 4.1 mEq/L (ref 3.5–5.1)
Sodium: 144 mEq/L (ref 135–145)

## 2010-12-24 ENCOUNTER — Ambulatory Visit (INDEPENDENT_AMBULATORY_CARE_PROVIDER_SITE_OTHER): Payer: Federal, State, Local not specified - PPO | Admitting: Family Medicine

## 2010-12-24 ENCOUNTER — Encounter: Payer: Self-pay | Admitting: Family Medicine

## 2010-12-24 DIAGNOSIS — I872 Venous insufficiency (chronic) (peripheral): Secondary | ICD-10-CM | POA: Insufficient documentation

## 2010-12-24 DIAGNOSIS — R609 Edema, unspecified: Secondary | ICD-10-CM | POA: Insufficient documentation

## 2010-12-24 DIAGNOSIS — E119 Type 2 diabetes mellitus without complications: Secondary | ICD-10-CM

## 2010-12-24 DIAGNOSIS — R600 Localized edema: Secondary | ICD-10-CM | POA: Insufficient documentation

## 2010-12-24 DIAGNOSIS — R079 Chest pain, unspecified: Secondary | ICD-10-CM

## 2010-12-24 LAB — POCT CARDIAC MARKERS
CKMB, poc: 3.3 ng/mL (ref 1.0–8.0)
Myoglobin, poc: 127 ng/mL (ref 12–200)
Troponin i, poc: 0.07 ng/mL (ref 0.00–0.09)

## 2010-12-27 ENCOUNTER — Telehealth (INDEPENDENT_AMBULATORY_CARE_PROVIDER_SITE_OTHER): Payer: Self-pay | Admitting: *Deleted

## 2010-12-30 ENCOUNTER — Telehealth (INDEPENDENT_AMBULATORY_CARE_PROVIDER_SITE_OTHER): Payer: Self-pay | Admitting: *Deleted

## 2011-01-07 NOTE — Progress Notes (Signed)
Summary: refill  Phone Note Refill Request Message from:  Fax from Pharmacy on December 30, 2010 2:19 PM  Refills Requested: Medication #1:  INDOMETHACIN 25 MG CAPS take 1 capsule 3 times a day.  can increase to 2 capsules three times a day as needed. [facility] [address] - fax [phone]  Initial call taken by: Okey Regal Spring,  December 30, 2010 2:20 PM    Prescriptions: INDOMETHACIN 25 MG CAPS (INDOMETHACIN) take 1 capsule 3 times a day.  can increase to 2 capsules three times a day as needed.  #30 Capsule x 1   Entered by:   Doristine Devoid CMA   Authorized by:   Neena Rhymes MD   Signed by:   Doristine Devoid CMA on 12/30/2010   Method used:   Electronically to        [facility] [address] #[id number]* (retail)       [address]       [place], [place]  [address]       Ph: [phone]       Fax: [phone]   RxID:   [id number]

## 2011-01-07 NOTE — Progress Notes (Signed)
Summary: cardiology referral   Phone Note Call from Patient Call back at Home Phone [phone]   Caller: Patient Summary of Call: Patient was calling to get the status of his Cardiology referral. I informed patient I will send this message to Dr.Tabori and Chemira.(No pending cardiology referral) Patient states if he is not avaliable ok to leave detailed message on machine   Chrae [facility] CMA  December 27, 2010 8:23 AM   Follow-up for Phone Call        pt was seen in ER by cards- they were to call him.  we can certainly put in referral to ease this process Follow-up by: Neena Rhymes MD,  December 28, 2010 9:05 AM  Additional Follow-up for Phone Call Additional follow up Details #1::        spoke w/ Luster Landsberg says referrals rarely are process from emergency room and best for [place] to set appt.Marland KitchenMarland KitchenMarland KitchenDoristine Devoid CMA  December 31, 2010 7:40 AM   New Problems: CHEST PAIN (ICD-786.50)   Additional Follow-up for Phone Call Additional follow up Details #2::    cards referral placed Follow-up by: Neena Rhymes MD,  December 31, 2010 8:08 AM  New Problems: CHEST PAIN (ICD-786.50)

## 2011-01-07 NOTE — Assessment & Plan Note (Signed)
Summary: swelling in feet/went to UC saturday/ok per nikki//kn   Vital Signs:  Patient profile:   40 year old male Height:      71.5 inches Weight:      341 pounds Temp:     98.1 degrees F oral Pulse rate:   86 / minute BP sitting:   140 / 82  (left arm)  Vitals Entered By: Jeremy Johann CMA (December 24, 2010 11:17 AM) CC: f/u ED swelling in left leg, chest discomfort   History of Present Illness: 40 yo man here today for   1) leg swelling- had swelling of legs bilaterally to level of lower thigh.  went to UC for evaluation.  was started on Lasix on Saturday- took meds Saturday and Sunday and the swelling improved.  has not had recurrence of sxs.  family hx of CHF.  w/ father's recent death there has been a lot of food- increased salt intake.  2) chest pain- last night developed 'heaviness in chest'.  went to ER last night.  father passed away last week.  EKG in ER was normal, CXR normal, CE's normal.  was seen by Cards in the ER.  this AM felt 'fine'.  now having intermittant chest pain- yesterday sxs improved when he was walking.  'it felt like reflux'.    3) DM- pt admits that w/ his father's death, the recent swelling and CP he is now scared and wants 'to take my diabetes seriously'.  would like to be referred for education and this time plans on following through.  Current Medications (verified): 1)  Glimepiride 2 Mg Tabs (Glimepiride) .... One By Mouth Every Morning - Due Office Visit in May 2011 2)  Metformin Hcl 1000 Mg Tabs (Metformin Hcl) .... Take 1 Tab  Two Times A Day 3)  Lipitor 40 Mg Tabs (Atorvastatin Calcium) .... Take One Tablet At Bedtime 4)  Norvasc 10 Mg Tabs (Amlodipine Besylate) .Marland Kitchen.. 1 By Mouth Once Daily 5)  Allopurinol 300 Mg Tabs (Allopurinol) .... Take 1 Tablet By Mouth Once A Day 6)  Colchicine 0.6 Mg Tabs (Colchicine) .... Take 1 Tablet By Mouth Two Times A Day and Then Decrease To Daily As Symptoms Allow 7)  Indomethacin 25 Mg Caps (Indomethacin) ....  Take 1 Capsule 3 Times A Day.  Can Increase To 2 Capsules Three Times A Day As Needed. 8)  Nasacort Aq 55 Mcg/act Aers (Triamcinolone Acetonide(Nasal)) .... 2 Sprays Each Nostril Qd 9)  Prilosec Otc 20 Mg Tbec (Omeprazole Magnesium) .Marland Kitchen.. 1 Before Breakfast and One Before Dinner 10)  One Touch Ultra .... Test Strips Lancets As Directed Dx 250.00 11)  Cyanocobalamin 1000 Mcg/ml Soln (Cyanocobalamin) .Marland Kitchen.. 1 Ml Monthly 12)  Vicodin 5-500 Mg Tabs (Hydrocodone-Acetaminophen) .Marland Kitchen.. 1 Tab By Mouth Q6 As Needed For Severe Pain. 13)  Fenofibrate 160 Mg Tabs (Fenofibrate) .... Take 1 Tab Once Daily 14)  Diovan 160 Mg Tabs (Valsartan) .Marland Kitchen.. 1 By Mouth Qd  Allergies (verified): 1)  ! Prednisone  Past History:  Past medical, surgical, family and social histories (including risk factors) reviewed for relevance to current acute and chronic problems.  Past Medical History: Reviewed history from 08/17/2009 and no changes required. DIABETES Dx 08-2009 GERD Gout Hyperlipidemia Hypertension  Past Surgical History: Reviewed history from 11/23/2009 and no changes required. L hand nerve repair after a cut  (1990s)  Family History: Reviewed history from 09/26/2008 and no changes required. MI --father had a heart attack at age 66, died of CHF colon cancer--no  prostate cancer  no DM--F CHF--F  Social History: Reviewed history from 05/02/2010 and no changes required. Divorced,   remarried June 2011 two children Occupation: maintanence Tobacco-- no ETOH-- occasionally   Review of Systems      See HPI  Physical Exam  General:  alert, well-developed, and obese   Head:  Normocephalic and atraumatic without obvious abnormalities. No apparent alopecia or balding. Neck:  no thyromegaly.   Lungs:  normal respiratory effort, no intercostal retractions, no accessory muscle use, and normal breath sounds.   Heart:  normal rate, regular rhythm, no murmur, and no gallop.   Abdomen:  soft, NT/ND, +BS,  obese Pulses:  +2 carotid, radial, DP Extremities:  no C/C/E Psych:  Cognition and judgment appear intact. Alert and cooperative with normal attention span and concentration.  not anxious appearing and not depressed appearing.     Impression & Recommendations:  Problem # 1:  EDEMA- LOCALIZED (ICD-782.3) Assessment New pt's sxs most likely due to high salt intake and relative inactivity.  sxs responded well to Lasix.  currently asymptomatic.  Problem # 2:  CHEST PAIN (ICD-786.50) Assessment: New pt's CP was worked up and deemed to be non-cardiac.  given obesity, DM, HTN, hyperlipidemia and family hx agree that it is a good idea to have outpt stress test w/ cards.  will refer if they do not call him.  Problem # 3:  DM (ICD-250.00) Assessment: Unchanged  pt admits he has not been taking his dx seriously.  now interested in education.  will refer. His updated medication list for this problem includes:    Glimepiride 2 Mg Tabs (Glimepiride) ..... One by mouth every morning - due office visit in may 2011    Metformin Hcl 1000 Mg Tabs (Metformin hcl) .Marland Kitchen... Take 1 tab  two times a day    Diovan 160 Mg Tabs (Valsartan) .Marland Kitchen... 1 by mouth qd  Orders: Diabetic Clinic Referral (Diabetic)  Complete Medication List: 1)  Glimepiride 2 Mg Tabs (Glimepiride) .... One by mouth every morning - due office visit in may 2011 2)  Metformin Hcl 1000 Mg Tabs (Metformin hcl) .... Take 1 tab  two times a day 3)  Lipitor 40 Mg Tabs (Atorvastatin calcium) .... Take one tablet at bedtime 4)  Norvasc 10 Mg Tabs (Amlodipine besylate) .Marland Kitchen.. 1 by mouth once daily 5)  Allopurinol 300 Mg Tabs (Allopurinol) .... Take 1 tablet by mouth once a day 6)  Colchicine 0.6 Mg Tabs (Colchicine) .... Take 1 tablet by mouth two times a day and then decrease to daily as symptoms allow 7)  Indomethacin 25 Mg Caps (Indomethacin) .... Take 1 capsule 3 times a day.  can increase to 2 capsules three times a day as needed. 8)  Nasacort Aq  55 Mcg/act Aers (Triamcinolone acetonide(nasal)) .... 2 sprays each nostril qd 9)  Prilosec Otc 20 Mg Tbec (Omeprazole magnesium) .Marland Kitchen.. 1 before breakfast and one before dinner 10)  One Touch Ultra  .... Test strips lancets as directed dx 250.00 11)  Cyanocobalamin 1000 Mcg/ml Soln (Cyanocobalamin) .Marland Kitchen.. 1 ml monthly 12)  Vicodin 5-500 Mg Tabs (Hydrocodone-acetaminophen) .Marland Kitchen.. 1 tab by mouth q6 as needed for severe pain. 13)  Fenofibrate 160 Mg Tabs (Fenofibrate) .... Take 1 tab once daily 14)  Diovan 160 Mg Tabs (Valsartan) .Marland Kitchen.. 1 by mouth qd  Patient Instructions: 1)  Follow up next month for diabetes 2)  Someone will call you with your cardiology appt 3)  Limit your salt intake 4)  Take  the Lasix as needed for swelling 5)  We'll refer you for diabetes education and call you with that appt 6)  Continue the Prilosec for the reflux 7)  Call with any questions or concerns 8)  Hang in there!!!   Orders Added: 1)  Est. Patient Level IV [16109] 2)  Diabetic Clinic Referral [Diabetic]

## 2011-01-09 ENCOUNTER — Encounter: Payer: Self-pay | Admitting: Family Medicine

## 2011-01-09 ENCOUNTER — Ambulatory Visit: Payer: Federal, State, Local not specified - PPO | Admitting: Cardiovascular Disease

## 2011-01-09 ENCOUNTER — Ambulatory Visit (INDEPENDENT_AMBULATORY_CARE_PROVIDER_SITE_OTHER): Payer: Federal, State, Local not specified - PPO | Admitting: Family Medicine

## 2011-01-09 DIAGNOSIS — J019 Acute sinusitis, unspecified: Secondary | ICD-10-CM | POA: Insufficient documentation

## 2011-01-09 DIAGNOSIS — E538 Deficiency of other specified B group vitamins: Secondary | ICD-10-CM

## 2011-01-10 ENCOUNTER — Telehealth (INDEPENDENT_AMBULATORY_CARE_PROVIDER_SITE_OTHER): Payer: Self-pay | Admitting: *Deleted

## 2011-01-13 ENCOUNTER — Telehealth: Payer: Self-pay | Admitting: Family Medicine

## 2011-01-15 ENCOUNTER — Encounter: Payer: Self-pay | Admitting: Family Medicine

## 2011-01-16 NOTE — Assessment & Plan Note (Signed)
Summary: HEAD CONGESTION/RH.....   Vital Signs:  Patient profile:   40 year old male Height:      71.5 inches (181.61 cm) Weight:      341.13 pounds (155.06 kg) BMI:     47.08 Temp:     98.8 degrees F (37.11 degrees C) oral BP sitting:   136 / 80  (left arm) Cuff size:   large  Vitals Entered By: Lucious Groves CMA (January 09, 2011 11:22 AM) CC: C/O possible URI  or sinus infection./kb Is Patient Diabetic? Yes Comments Patient notes that he has been having HA, congestion, and cough with little yellow mucous production x2 weeks. He denies fever. Patient also notes that he would like his B-12 inj today and samples of Prilosec and Nasacort.   History of Present Illness: 40 yo man here today for ? sinus infxn.  wife was sick last week and pt developed facial pressre, ear fullness, nasal congestion.  minimal cough.  no fever.  + swollen glands.  no sore throat.  Current Medications (verified): 1)  Glimepiride 2 Mg Tabs (Glimepiride) .... One By Mouth Every Morning - Due Office Visit in May 2011 2)  Metformin Hcl 1000 Mg Tabs (Metformin Hcl) .... Take 1 Tab  Two Times A Day 3)  Lipitor 40 Mg Tabs (Atorvastatin Calcium) .... Take One Tablet At Bedtime 4)  Norvasc 10 Mg Tabs (Amlodipine Besylate) .Marland Kitchen.. 1 By Mouth Once Daily 5)  Allopurinol 300 Mg Tabs (Allopurinol) .... Take 1 Tablet By Mouth Once A Day 6)  Colchicine 0.6 Mg Tabs (Colchicine) .... Take 1 Tablet By Mouth Two Times A Day and Then Decrease To Daily As Symptoms Allow 7)  Indomethacin 25 Mg Caps (Indomethacin) .... Take 1 Capsule 3 Times A Day.  Can Increase To 2 Capsules Three Times A Day As Needed. 8)  Nasacort Aq 55 Mcg/act Aers (Triamcinolone Acetonide(Nasal)) .... 2 Sprays Each Nostril Qd 9)  Prilosec Otc 20 Mg Tbec (Omeprazole Magnesium) .Marland Kitchen.. 1 Before Breakfast and One Before Dinner 10)  One Touch Ultra .... Test Strips Lancets As Directed Dx 250.00 11)  Cyanocobalamin 1000 Mcg/ml Soln (Cyanocobalamin) .Marland Kitchen.. 1 Ml Monthly 12)   Vicodin 5-500 Mg Tabs (Hydrocodone-Acetaminophen) .Marland Kitchen.. 1 Tab By Mouth Q6 As Needed For Severe Pain. 13)  Fenofibrate 160 Mg Tabs (Fenofibrate) .... Take 1 Tab Once Daily 14)  Diovan 160 Mg Tabs (Valsartan) .Marland Kitchen.. 1 By Mouth Qd 15)  Amoxicillin 875 Mg Tabs (Amoxicillin) .Marland Kitchen.. 1 Tab By Mouth Two Times A Day X10 Days.  Take W/ Food.  Allergies (verified): 1)  ! Prednisone  Review of Systems      See HPI  Physical Exam  General:  alert, well-developed, and obese   Head:  NCAT, no TTP over frontal sinuses, + TTP over maxillary sinuses Eyes:  no injxn or inflammation Ears:  TMs normal bilaterally Nose:  marked turbinate edema Mouth:  postnasal drip.   Neck:  no thyromegaly.   Lungs:  normal respiratory effort, no intercostal retractions, no accessory muscle use, and normal breath sounds.   Heart:  normal rate, regular rhythm, no murmur, and no gallop.     Impression & Recommendations:  Problem # 1:  SINUSITIS - ACUTE-NOS (ICD-461.9) Assessment New pt's sxs consistent w/ infxn.  start amox.  reviewed supportive care and red flags that should prompt return.  Pt expresses understanding and is in agreement w/ this plan. His updated medication list for this problem includes:    Nasacort Aq 55 Mcg/act  Aers (Triamcinolone acetonide(nasal)) .Marland Kitchen... 2 sprays each nostril qd    Amoxicillin 875 Mg Tabs (Amoxicillin) .Marland Kitchen... 1 tab by mouth two times a day x10 days.  take w/ food.  Complete Medication List: 1)  Glimepiride 2 Mg Tabs (Glimepiride) .... One by mouth every morning - due office visit in may 2011 2)  Metformin Hcl 1000 Mg Tabs (Metformin hcl) .... Take 1 tab  two times a day 3)  Lipitor 40 Mg Tabs (Atorvastatin calcium) .... Take one tablet at bedtime 4)  Norvasc 10 Mg Tabs (Amlodipine besylate) .Marland Kitchen.. 1 by mouth once daily 5)  Allopurinol 300 Mg Tabs (Allopurinol) .... Take 1 tablet by mouth once a day 6)  Colchicine 0.6 Mg Tabs (Colchicine) .... Take 1 tablet by mouth two times a day and  then decrease to daily as symptoms allow 7)  Indomethacin 25 Mg Caps (Indomethacin) .... Take 1 capsule 3 times a day.  can increase to 2 capsules three times a day as needed. 8)  Nasacort Aq 55 Mcg/act Aers (Triamcinolone acetonide(nasal)) .... 2 sprays each nostril qd 9)  Prilosec Otc 20 Mg Tbec (Omeprazole magnesium) .Marland Kitchen.. 1 before breakfast and one before dinner 10)  One Touch Ultra  .... Test strips lancets as directed dx 250.00 11)  Cyanocobalamin 1000 Mcg/ml Soln (Cyanocobalamin) .Marland Kitchen.. 1 ml monthly 12)  Vicodin 5-500 Mg Tabs (Hydrocodone-acetaminophen) .Marland Kitchen.. 1 tab by mouth q6 as needed for severe pain. 13)  Fenofibrate 160 Mg Tabs (Fenofibrate) .... Take 1 tab once daily 14)  Diovan 160 Mg Tabs (Valsartan) .Marland Kitchen.. 1 by mouth qd 15)  Amoxicillin 875 Mg Tabs (Amoxicillin) .Marland Kitchen.. 1 tab by mouth two times a day x10 days.  take w/ food.  Other Orders: Vit B12 1000 mcg (J3420) Admin of Therapeutic Inj  intramuscular or subcutaneous (16109)  Patient Instructions: 1)  You have a sinus infection 2)  Take the Amoxicillin as directed- take w/ food to avoid upset stomach 3)  Start your nasal spray to improve your nasal congestion 4)  Add Claritin/Zyrtec/Allegra daily for your allergies 5)  Drink plenty of fluids 6)  Tylenol or ibuprofen as needed for pain or fever 7)  Hang in there!!! Prescriptions: AMOXICILLIN 875 MG TABS (AMOXICILLIN) 1 tab by mouth two times a day x10 days.  take w/ food.  #20 x 0   Entered and Authorized by:   Neena Rhymes MD   Signed by:   Neena Rhymes MD on 01/09/2011   Method used:   Electronically to        [facility] #[id number]* (retail)       [address]       [place], [place]  [address]       Ph: [phone]       Fax: [phone]   RxID:   [id number] NASACORT AQ 55 MCG/ACT AERS (TRIAMCINOLONE ACETONIDE(NASAL)) 2 sprays each nostril qd  #1 x 6   Entered and Authorized by:   Neena Rhymes MD   Signed by:    Neena Rhymes MD on 01/09/2011   Method used:   Electronically to        [facility] [address] #[id number]* (retail)       [address]       [place], [place]  [address]       Ph: [phone]       Fax: [phone]  RxID:   [id number]    Medication Administration  Injection # 1:    Medication: Vit B12 1000 mcg    Diagnosis: VITAMIN B12 DEFICIENCY (ICD-266.2)    Route: IM    Site: L deltoid    Exp Date: 01/09/2012    Lot #: [id number]    Mfr: [employer]    Patient tolerated injection without complications    Given by: Lucious Groves CMA (January 09, 2011 11:50 AM)  Orders Added: 1)  Vit B12 1000 mcg [J3420] 2)  Admin of Therapeutic Inj  intramuscular or subcutaneous [96372] 3)  Est. Patient Level III [25956]

## 2011-01-21 NOTE — Progress Notes (Signed)
Summary: burninf in abdomen  Phone Note Call from Patient   Summary of Call: Pt c/o burning sensation in left lower abdominal, accompany by some nausea and pressure/ burning in chest x2weeks. Pt notes that he is taking prilosec as directed. Pt denies any tenderness, swelling or pain in abdominal area. Pt notes that symptoms occur throughout the day but are not increase or decrease by anything specific . Pls advise.........Marland KitchenFelecia Deloach CMA  January 13, 2011 10:06 AM   Follow-up for Phone Call        would need appt to discuss this.  if unable to come for appt during office hours should go to UC or ER.  these things (abd pain, chest burning) may be related or may be separate. Follow-up by: Neena Rhymes MD,  January 13, 2011 11:01 AM  Additional Follow-up for Phone Call Additional follow up Details #1::        Pt aware will call for appt once he can secure a ride..........Marland KitchenFelecia Deloach CMA  January 13, 2011 11:34 AM

## 2011-01-21 NOTE — Progress Notes (Signed)
Summary: refill  Phone Note Refill Request Call back at [facility] Phone [phone]   Refills Requested: Medication #1:  INDOMETHACIN 25 MG CAPS take 1 capsule 3 times a day.  can increase to 2 capsules three times a day as needed. Last filled 12-30-10 #30 1, last OV 01-09-11....Marland KitchenMarland KitchenFelecia Deloach CMA  January 10, 2011 3:04 PM    Follow-up for Phone Call        has he used 60 pills in 11 days??  (he hd 30 w/ 1 refill) Follow-up by: Neena Rhymes MD,  January 10, 2011 3:08 PM  Additional Follow-up for Phone Call Additional follow up Details #1::         Pt would like to have Rx for 75 mg in 90 day supply  Pt has about 9 tabs left of Rx on 12-30-10 has not picked up refill as of yet. Pt takes 1 to 2  tabs daily. Pt states that he has been taking med daily instead of as needed. Pls advise on refill and med directions.......Marland KitchenFelecia Deloach CMA  January 10, 2011 3:15 PM     Additional Follow-up for Phone Call Additional follow up Details #2::    this medicine is used for the treatment of gout and should be taken only when he has gout flares, not everyday.  i don't mind increasing to the 75mg  sustained release form but it should still not be a daily medicine. Follow-up by: Neena Rhymes MD,  January 10, 2011 3:23 PM  Additional Follow-up for Phone Call Additional follow up Details #3:: Details for Additional Follow-up Action Taken: Pt aware pls verify directions of new Rx and dispense number for 3 month supply..............Marland KitchenFelecia Deloach CMA  January 10, 2011 5:12 PM   Pt aware Rx sent to pharmacy.Felecia Deloach CMA  January 13, 2011 10:26 AM   New/Updated Medications: INDOMETHACIN CR 75 MG CR-CAPS (INDOMETHACIN)  INDOMETHACIN CR 75 MG CR-CAPS (INDOMETHACIN) 1 tab daily as needed for gout flares Prescriptions: INDOMETHACIN CR 75 MG CR-CAPS (INDOMETHACIN) 1 tab daily as needed for gout flares  #90 x 3   Entered and Authorized by:   Neena Rhymes MD   Signed by:   Neena Rhymes MD on  01/13/2011   Method used:   Electronically to        [facility] #[id number]* (retail)       [address]       [place], [place]  [address]       Ph: [phone]       Fax: [phone]   RxID:   [id number]

## 2011-01-23 ENCOUNTER — Other Ambulatory Visit: Payer: Self-pay | Admitting: Family Medicine

## 2011-01-23 ENCOUNTER — Encounter: Payer: Self-pay | Admitting: Family Medicine

## 2011-01-23 ENCOUNTER — Ambulatory Visit (INDEPENDENT_AMBULATORY_CARE_PROVIDER_SITE_OTHER): Payer: Federal, State, Local not specified - PPO | Admitting: Family Medicine

## 2011-01-23 DIAGNOSIS — E119 Type 2 diabetes mellitus without complications: Secondary | ICD-10-CM

## 2011-01-23 DIAGNOSIS — I10 Essential (primary) hypertension: Secondary | ICD-10-CM

## 2011-01-23 DIAGNOSIS — K219 Gastro-esophageal reflux disease without esophagitis: Secondary | ICD-10-CM

## 2011-01-23 LAB — BASIC METABOLIC PANEL
BUN: 12 mg/dL (ref 6–23)
Calcium: 9.9 mg/dL (ref 8.4–10.5)
Creatinine, Ser: 0.9 mg/dL (ref 0.4–1.5)

## 2011-01-23 LAB — H. PYLORI ANTIBODY, IGG: H Pylori IgG: NEGATIVE

## 2011-01-23 LAB — HEMOGLOBIN A1C: Hgb A1c MFr Bld: 8 % — ABNORMAL HIGH (ref 4.6–6.5)

## 2011-02-06 NOTE — Assessment & Plan Note (Signed)
Summary: on month ov//ph--[phone]   Vital Signs:  Patient profile:   40 year old male Height:      71.5 inches (181.61 cm) Weight:      345 pounds (156.82 kg) BMI:     47.62 Temp:     98.2 degrees F (36.78 degrees C) oral BP sitting:   120 / 70  (left arm) Cuff size:   large  Vitals Entered By: Lucious Groves CMA (January 23, 2011 11:31 AM) CC: 1 mo diabetes check with concerns./kb Is Patient Diabetic? Yes Comments Patient notes that he is having stomach issues. He has been having left side burning, muscle spasms of the chest, and SOB. He denies fever and dizziness.   History of Present Illness: 40 yo man here today for  1) DM- fasting CBGs running 130-150s.  taking Metformin two times a day regularly.  has started exercising- walking 1 mile every other day.  reports he notices a difference since starting exercise.  trying to reduce portion sizes.  had 1 symptomatic low at church b/c he didn't eat breakfast- this improved w/ snack.  no recent edema.  2) HTN- BP is excellent today.  on Diovan and norvasc.  no HAs, visual changes, edema, chest pressure.  3) 'sour stomach'- taking Prilosec 20mg  daily.  having some burning in chest.  will also have gas pain under ribs and across shoulders.  Current Medications (verified): 1)  Glimepiride 2 Mg Tabs (Glimepiride) .... One By Mouth Every Morning - Due Office Visit in May 2011 2)  Metformin Hcl 1000 Mg Tabs (Metformin Hcl) .... Take 1 Tab  Two Times A Day 3)  Lipitor 40 Mg Tabs (Atorvastatin Calcium) .... Take One Tablet At Bedtime 4)  Norvasc 10 Mg Tabs (Amlodipine Besylate) .Marland Kitchen.. 1 By Mouth Once Daily 5)  Allopurinol 300 Mg Tabs (Allopurinol) .... Take 1 Tablet By Mouth Once A Day 6)  Colchicine 0.6 Mg Tabs (Colchicine) .... Take 1 Tablet By Mouth Two Times A Day and Then Decrease To Daily As Symptoms Allow 7)  Indomethacin Cr 75 Mg Cr-Caps (Indomethacin) .Marland Kitchen.. 1 Tab Daily As Needed For Gout Flares 8)  Nasacort Aq 55 Mcg/act Aers  (Triamcinolone Acetonide(Nasal)) .... 2 Sprays Each Nostril Qd 9)  Prilosec 40 Mg Cpdr (Omeprazole) .Marland Kitchen.. 1 Tab By Mouth Daily 10)  One Touch Ultra .... Test Strips Lancets As Directed Dx 250.00 11)  Cyanocobalamin 1000 Mcg/ml Soln (Cyanocobalamin) .Marland Kitchen.. 1 Ml Monthly 12)  Vicodin 5-500 Mg Tabs (Hydrocodone-Acetaminophen) .Marland Kitchen.. 1 Tab By Mouth Q6 As Needed For Severe Pain. 13)  Fenofibrate 160 Mg Tabs (Fenofibrate) .... Take 1 Tab Once Daily 14)  Diovan 160 Mg Tabs (Valsartan) .Marland Kitchen.. 1 By Mouth Qd 15)  Amoxicillin 875 Mg Tabs (Amoxicillin) .Marland Kitchen.. 1 Tab By Mouth Two Times A Day X10 Days.  Take W/ Food.  Allergies (verified): 1)  ! Prednisone  Past History:  Past medical, surgical, family and social histories (including risk factors) reviewed, and no changes noted (except as noted below).  Past Medical History: Reviewed history from 08/17/2009 and no changes required. DIABETES Dx 08-2009 GERD Gout Hyperlipidemia Hypertension  Past Surgical History: Reviewed history from 11/23/2009 and no changes required. L hand nerve repair after a cut  (1990s)  Family History: Reviewed history from 12/24/2010 and no changes required. MI --father had a heart attack at age 62, died of CHF colon cancer--no prostate cancer  no DM--F CHF--F  Social History: Reviewed history from 05/02/2010 and no changes required. Divorced,   remarried  June 2011 two children Occupation: maintanence Tobacco-- no ETOH-- occasionally   Review of Systems      See HPI  Physical Exam  General:  alert, well-developed, and obese   Neck:  no thyromegaly.   Lungs:  normal respiratory effort, no intercostal retractions, no accessory muscle use, and normal breath sounds.   Heart:  normal rate, regular rhythm, no murmur, and no gallop.   Abdomen:  soft, NT/ND, +BS, obese Pulses:  +2 carotid, radial, DP Extremities:  no C/C/E Neurologic:  alert & oriented X3, cranial nerves II-XII intact, strength normal in all  extremities, gait normal, and DTRs symmetrical and normal.   Cervical Nodes:  No lymphadenopathy noted Psych:  Cognition and judgment appear intact. Alert and cooperative with normal attention span and concentration.  not anxious appearing and not depressed appearing.     Impression & Recommendations:  Problem # 1:  DM (ICD-250.00) Assessment Unchanged pt is making changes to his lifestyle by attempting to limit portion sizes and exercise every other day.  due for A1C.  adjust meds as needed. His updated medication list for this problem includes:    Glimepiride 2 Mg Tabs (Glimepiride) ..... One by mouth every morning - due office visit in may 2011    Metformin Hcl 1000 Mg Tabs (Metformin hcl) .Marland Kitchen... Take 1 tab  two times a day    Diovan 160 Mg Tabs (Valsartan) .Marland Kitchen... 1 by mouth qd    Januvia 100 Mg Tabs (Sitagliptin phosphate) .Marland Kitchen... 1 by mouth once daily  Orders: Venipuncture (47829) Specimen Handling (56213) TLB-A1C / Hgb A1C (Glycohemoglobin) (83036-A1C) TLB-BMP (Basic Metabolic Panel-BMET) (80048-METABOL)  Problem # 2:  HYPERTENSION (ICD-401.9) Assessment: Unchanged BP well controlled today.  asymptomatic. His updated medication list for this problem includes:    Norvasc 10 Mg Tabs (Amlodipine besylate) .Marland Kitchen... 1 by mouth once daily    Diovan 160 Mg Tabs (Valsartan) .Marland Kitchen... 1 by mouth qd  Problem # 3:  GERD (ICD-530.81) Assessment: Deteriorated pt's sxs not well controlled on Omeprazole 20mg - will increase to 40mg .  check H pylori to r/o PUD. His updated medication list for this problem includes:    Prilosec 40 Mg Cpdr (Omeprazole) .Marland Kitchen... 1 tab by mouth daily  Orders: Specimen Handling (08657) TLB-H. Pylori Abs(Helicobacter Pylori) (86677-HELICO)  Complete Medication List: 1)  Glimepiride 2 Mg Tabs (Glimepiride) .... One by mouth every morning - due office visit in may 2011 2)  Metformin Hcl 1000 Mg Tabs (Metformin hcl) .... Take 1 tab  two times a day 3)  Lipitor 40 Mg Tabs  (Atorvastatin calcium) .... Take one tablet at bedtime 4)  Norvasc 10 Mg Tabs (Amlodipine besylate) .Marland Kitchen.. 1 by mouth once daily 5)  Allopurinol 300 Mg Tabs (Allopurinol) .... Take 1 tablet by mouth once a day 6)  Colchicine 0.6 Mg Tabs (Colchicine) .... Take 1 tablet by mouth two times a day and then decrease to daily as symptoms allow 7)  Indomethacin Cr 75 Mg Cr-caps (Indomethacin) .Marland Kitchen.. 1 tab daily as needed for gout flares 8)  Nasacort Aq 55 Mcg/act Aers (Triamcinolone acetonide(nasal)) .... 2 sprays each nostril qd 9)  Prilosec 40 Mg Cpdr (Omeprazole) .Marland Kitchen.. 1 tab by mouth daily 10)  One Touch Ultra  .... Test strips lancets as directed dx 250.00 11)  Cyanocobalamin 1000 Mcg/ml Soln (Cyanocobalamin) .Marland Kitchen.. 1 ml monthly 12)  Vicodin 5-500 Mg Tabs (Hydrocodone-acetaminophen) .Marland Kitchen.. 1 tab by mouth q6 as needed for severe pain. 13)  Fenofibrate 160 Mg Tabs (Fenofibrate) .... Take 1  tab once daily 14)  Diovan 160 Mg Tabs (Valsartan) .Marland Kitchen.. 1 by mouth qd 15)  Amoxicillin 875 Mg Tabs (Amoxicillin) .Marland Kitchen.. 1 tab by mouth two times a day x10 days.  take w/ food. 16)  Januvia 100 Mg Tabs (Sitagliptin phosphate) .Marland Kitchen.. 1 by mouth once daily  Patient Instructions: 1)  Follow up in 3 months to recheck diabetes and cholesterol- do not eat before this appt 2)  We'll notify you of your lab results and make changes as needed 3)  Switch to the 40mg  of Prilosec to help w/ the sour stomach 4)  Keep up the good work on diet and exercise 5)  Call with any questions or concerns 6)  Hang in there!!! Prescriptions: PRILOSEC 40 MG CPDR (OMEPRAZOLE) 1 tab by mouth daily  #30 x 3   Entered and Authorized by:   Neena Rhymes MD   Signed by:   Neena Rhymes MD on 01/23/2011   Method used:   Electronically to        [facility] [address] [phone]* (retail)       [address]       [place], [place]  [address]       Ph: [phone]       Fax: [phone]   RxID:    [id number]    Orders Added: 1)  Venipuncture [42595] 2)  Specimen Handling [99000] 3)  TLB-A1C / Hgb A1C (Glycohemoglobin) [83036-A1C] 4)  TLB-BMP (Basic Metabolic Panel-BMET) [80048-METABOL] 5)  TLB-H. Pylori Abs(Helicobacter Pylori) [86677-HELICO] 6)  Est. Patient Level IV [63875]

## 2011-02-11 ENCOUNTER — Ambulatory Visit (INDEPENDENT_AMBULATORY_CARE_PROVIDER_SITE_OTHER): Payer: Federal, State, Local not specified - PPO | Admitting: Family Medicine

## 2011-02-11 ENCOUNTER — Encounter: Payer: Self-pay | Admitting: Family Medicine

## 2011-02-11 DIAGNOSIS — R079 Chest pain, unspecified: Secondary | ICD-10-CM

## 2011-02-11 DIAGNOSIS — E119 Type 2 diabetes mellitus without complications: Secondary | ICD-10-CM

## 2011-02-11 DIAGNOSIS — E669 Obesity, unspecified: Secondary | ICD-10-CM

## 2011-02-11 NOTE — Patient Instructions (Signed)
We will call you with your cardiology, GI, and podiatry appts Stop the Omeprazole Start the Nexium daily Try and monitory your symptoms- when they occur, what makes them better or worse, how long they last, etc Try not to eat before lying down- this will likely make your symptoms worse Call with any questions or concerns Hang in there!!!

## 2011-02-11 NOTE — Assessment & Plan Note (Signed)
Applauded pt's recent weight loss and efforts at daily exercise.  Stressed that weight loss will improve his BP, DM, GERD, musculoskeletal pain.

## 2011-02-11 NOTE — Progress Notes (Signed)
  Subjective:    Patient ID: Aaron Franco, male    DOB: 03-Sep-1971, 40 y.o.   MRN: [id number]  HPI Chest pain- having chest 'spasm' frequently.  Occurs daily.  Will improve somewhat w/ Gas-X.  Will wake up at night w/ chest sxs.  Will eat before lying down for bed.  Isn't sure if those are the nights he has sxs.  sxs will occur after taking the Omeprazole- which initially relieved sxs.  Pt did not follow up w/ cards after cancelling his appt.  Has never had GI eval.  Denies N/V, SOB, diaphoresis.  Chest pain will 'move'- rarely in same place and never lasts long.  Obesity- has lost 10 lbs in 3 weeks.  Has started walking regularly.  No chest pain w/ exertion.  DM- has not scheduled eye exam, has never seen podiatry.  Complains of occasional 'burning' of feet bilaterally.  Asymptomatic today.   Review of Systems For ROS see HPI     Objective:   Physical Exam  Constitutional: He is oriented to person, place, and time. He appears well-developed and well-nourished. No distress.  HENT:  Head: Normocephalic and atraumatic.  Neck: Normal range of motion. Neck supple.  Cardiovascular: Normal rate, regular rhythm and intact distal pulses.   No murmur heard. Pulmonary/Chest: Breath sounds normal. No respiratory distress. He has no wheezes.  Abdominal: Soft. Bowel sounds are normal. He exhibits no distension. There is no tenderness. There is no rebound.  Musculoskeletal: He exhibits no edema.  Neurological: He is alert and oriented to person, place, and time.  Skin: Skin is warm and dry.          Assessment & Plan:

## 2011-02-11 NOTE — Assessment & Plan Note (Signed)
Stressed the importance of scheduling eye exam.  Will refer to podiatry for evaluation of possible neuropathy.

## 2011-02-11 NOTE — Assessment & Plan Note (Signed)
Will attempt to reschedule pt w/ cards.  Feel strongly that after multiple ER workups that pt's chest pain isn't cardiac- seems more GERD related but given multiple risk factors proceeding w/ stress test is a good idea.  Will also refer to GI for complete evaluation.  Switch from omeprazole 20mg  to Nexium 40mg  and follow closely.  Asked pt to chart sxs.

## 2011-02-17 ENCOUNTER — Other Ambulatory Visit: Payer: Self-pay | Admitting: Internal Medicine

## 2011-02-20 ENCOUNTER — Encounter: Payer: Self-pay | Admitting: Cardiovascular Disease

## 2011-02-21 ENCOUNTER — Encounter: Payer: Self-pay | Admitting: Cardiovascular Disease

## 2011-02-21 ENCOUNTER — Ambulatory Visit (INDEPENDENT_AMBULATORY_CARE_PROVIDER_SITE_OTHER): Payer: Federal, State, Local not specified - PPO | Admitting: Cardiovascular Disease

## 2011-02-21 DIAGNOSIS — R011 Cardiac murmur, unspecified: Secondary | ICD-10-CM | POA: Insufficient documentation

## 2011-02-21 DIAGNOSIS — E785 Hyperlipidemia, unspecified: Secondary | ICD-10-CM

## 2011-02-21 DIAGNOSIS — E669 Obesity, unspecified: Secondary | ICD-10-CM

## 2011-02-21 DIAGNOSIS — R079 Chest pain, unspecified: Secondary | ICD-10-CM

## 2011-02-21 NOTE — Assessment & Plan Note (Signed)
Atypical with negative ER w/u and two normal ECG's.  Multiple CRF;s including DM.  ETT to be done

## 2011-02-21 NOTE — Assessment & Plan Note (Signed)
Consider trial off metformin since this seems to aggravate GERD.  F/U Dr Beverely Low.  Target A1c 6.5 or less

## 2011-02-21 NOTE — Assessment & Plan Note (Signed)
Cholesterol is at goal.  Continue current dose of statin and diet Rx.  No myalgias or side effects.  F/U  LFT's in 6 months. Lab Results  Component Value Date   LDLCALC 118* 10/28/2010

## 2011-02-21 NOTE — Progress Notes (Signed)
40 yo referred by Dr Beverely Low.  Obese male with DM Rx for 2 years, HTN Rx for about 10 years.  Family history CAD with father MI age 56.  Seen in ER 02/21/11 with [facility].  Reviewed records and labs.  R/O, CXR normal with no cardiomegaly, and negative enzmes.  Symptoms have gi overtones.  Recently switched to nexium and gas x with some relief in pain.  Has F/U with Dr. Christella Hartigan 4/28.  Pain is like a 'spasm: central and right sided.  Persistant.  Can be related to food and also his medicines particularly metformin.  No associated diaphoresis, palpitations or edema.  Mild chronic dyspnea likely from weight.  No history of DVT, PE or hypercoagulability.  ECG from ER 4/13 reviewed and normal.  Had right biceps surgery in January at [facility] with no complications.  Since he last saw Dr. Beverely Low he has lost 10 lbs and is working on walking daily and portion control.  No nausea, vohmiting, melena or history of GB disease.  Because of muliple CRF;s , [facility] ppt ER visit patient should have risk stratifying test.  Discussed routine ETT with him since his ECG is normal.  Encouraged him to F/U with gi.  Link between obesity, carbs and GERD discussed.  Also issue with insulin resistance and weight as well as gastroparesis discussed.  Long term effects of obesity discussed and he would be a good candidate for bariatric surgery if life style changes fail.    ROS: Denies fever, malais, weight loss, blurry vision, decreased visual acuity, cough, sputum, SOB, hemoptysis, pleuritic pain, palpitaitons, heartburn, abdominal pain, melena, lower extremity edema, claudication, or rash.   General: Affect appropriate Healthy:  appears stated age HEENT: normal Neck supple with no adenopathy JVP normal no bruits no thyromegaly Lungs clear with no wheezing and good diaphragmatic motion Heart:  S1/S2 SEM no ,rub, gallop or click PMI normal Abdomen: benighn, BS positve, no tenderness, no AAA no bruit.  No HSM or HJR Distal  pulses intact with no bruits No edema Neuro non-focal Skin warm and dry No muscular weakness Right biceps tendon surgery  Medications Current Outpatient Prescriptions  Medication Sig Dispense Refill  . allopurinol (ZYLOPRIM) 300 MG tablet TAKE 1 TABLET BY MOUTH ONCE DAILY FOR GOUT PREVENTION  30 tablet  3  . amLODipine (NORVASC) 10 MG tablet Take 10 mg by mouth daily.        Marland Kitchen atorvastatin (LIPITOR) 40 MG tablet Take 40 mg by mouth daily.        . colchicine 0.6 MG tablet Take 0.6 mg by mouth 2 (two) times daily. Then decrease to daily a symptoms allow       . cyanocobalamin (,VITAMIN B-12,) 1000 MCG/ML injection Inject 1,000 mcg into the muscle every 30 (thirty) days.        . fenofibrate 160 MG tablet Take 160 mg by mouth daily.        Marland Kitchen glimepiride (AMARYL) 2 MG tablet Take 2 mg by mouth daily before breakfast.        . Glucose Blood (ONETOUCH ULTRA BLUE VI) Test strips and lancets as directed dx: 250.00       . indomethacin (INDOCIN) 25 MG capsule Take 25 mg by mouth 3 (three) times daily with meals. Can increase to 2 capsules three times a day as needed       . metFORMIN (GLUCOPHAGE) 1000 MG tablet Take 1,000 mg by mouth 2 (two) times daily with a meal.        .  Simethicone (GAS-X PO) Take by mouth as needed.        . sitaGLIPtan (JANUVIA) 100 MG tablet Take 100 mg by mouth daily.        Marland Kitchen triamcinolone (NASACORT AQ) 55 MCG/ACT nasal inhaler 2 sprays by Nasal route daily.        . valsartan (DIOVAN) 160 MG tablet Take 160 mg by mouth daily.        Marland Kitchen HYDROcodone-acetaminophen (VICODIN) 5-500 MG per tablet Take 1 tablet by mouth every 6 (six) hours as needed.        Marland Kitchen DISCONTD: omeprazole (PRILOSEC) 20 MG capsule Take 20 mg by mouth daily. 1 before breakfast and one before dinner         Allergies Prednisone  Family History: Family History  Problem Relation Age of Onset  . Heart attack Father     Social History: History   Social History  . Marital Status: Married     Spouse Name: N/A    Number of Children: N/A  . Years of Education: N/A   Occupational History  . Not on file.   Social History Main Topics  . Smoking status: Former Games developer  . Smokeless tobacco: Not on file  . Alcohol Use: Not on file  . Drug Use: Not on file  . Sexually Active: Not on file   Other Topics Concern  . Not on file   Social History Narrative  . No narrative on file  Married for less than a year.  Originally from [place] area.  Works at [employer].  Loves to sing and dance and actually has Dolores Frame at the [employer] on multiple occasions.  Nonsmoker Occasional ETOH.    Electrocardiogram:  02/21/11 and today both ECG's NSR and normal  Assessment and Plan

## 2011-02-21 NOTE — Assessment & Plan Note (Signed)
Well controlled.  Continue current medications and low sodium Dash type diet.    

## 2011-02-21 NOTE — Assessment & Plan Note (Signed)
Likely etiology of pains.  Continue gas-x and nexium.  F/U with gi and proceed with EGD if advised

## 2011-02-21 NOTE — Assessment & Plan Note (Signed)
SEM not previously described.  Echo 

## 2011-02-21 NOTE — Assessment & Plan Note (Signed)
Continue exercise and diet with low carbs.  Consider bariatric referral if lifestyle changes fail

## 2011-02-21 NOTE — Patient Instructions (Addendum)
Your physician has requested that you have an exercise tolerance test with Dr. Eden Emms. For further information please visit [email or web address] Please also follow instruction sheet, as given. /786.5  Your physician has requested that you have an echocardiogram. Echocardiography is a painless test that uses sound waves to create images of your heart. It provides your doctor with information about the size and shape of your heart and how well your heart's chambers and valves are working. This procedure takes approximately one hour. There are no restrictions for this procedure./785.2

## 2011-02-26 ENCOUNTER — Encounter: Payer: Self-pay | Admitting: Cardiovascular Disease

## 2011-02-26 ENCOUNTER — Ambulatory Visit (INDEPENDENT_AMBULATORY_CARE_PROVIDER_SITE_OTHER): Payer: Federal, State, Local not specified - PPO | Admitting: Gastroenterology

## 2011-02-26 ENCOUNTER — Encounter: Payer: Self-pay | Admitting: Gastroenterology

## 2011-02-26 ENCOUNTER — Encounter: Payer: Self-pay | Admitting: Cardiology

## 2011-02-26 VITALS — BP 128/68 | HR 80 | Ht 72.0 in | Wt 331.0 lb

## 2011-02-26 DIAGNOSIS — K219 Gastro-esophageal reflux disease without esophagitis: Secondary | ICD-10-CM

## 2011-02-26 NOTE — Patient Instructions (Signed)
You will be set up for an upper endoscopy. Stay on the nexium 1 pill daily. A copy of this information will be made available to Dr. Beverely Low.

## 2011-02-26 NOTE — Progress Notes (Signed)
HPI: This is a  very pleasant 40 year old man who is here with his wife today.  Having pressures in his "chest area" for 3 - 4 months.  Migrating pressure, right to left to middle.  Also describes as a spasm.  They can happen at any time of day.  Not particularly related to eating.  Walking can bring it on.  Thinks some of his pills may be causing it.  No associated nausea or vomiting.  Sometimes has a minor "little, acidy" feeling in abdomen.   He does get pyrosis, chronically.  Has been on a PPI for years, but this was changed to nexium 3 weeks ago.  Has not noticed any changes in his symptoms  No dysphagia.  No overt GI bleeding.  He has lost 14 pounds recently, intentionally.  With exercise, portion control.  H. Pylori testing (serologically) was negative. CBC 2 months ago was normal.   EKG ok, scheduled for stress test 2 weeks from now.    Review of systems: Pertinent positive and negative review of systems were noted in the above HPI section.  All other review of systems was otherwise negative.   Past Medical History, Past Surgical History, Family History, Social History, Current Medications, Allergies were all reviewed with the patient via [facility] electronic medical record system.   Physical Exam: BP 128/68  Pulse 80  Ht 6' (1.829 m)  Wt 331 lb (150.141 kg)  BMI 44.89 kg/m2 Constitutional: generally well-appearing Psychiatric: alert and oriented x3 Eyes: extraocular movements intact Mouth: oral pharynx moist, no lesions Neck: supple no lymphadenopathy Cardiovascular: heart regular rate and rhythm Lungs: clear to auscultation bilaterally Abdomen: soft, nontender, nondistended, no obvious ascites, no peritoneal signs, normal bowel sounds Extremities: no lower extremity edema bilaterally Skin: no lesions on visible extremities    Assessment and plan: 40 y.o. male with  GERD, dyspepsia, intermittent chest spasms  It is not clear if his symptoms are related to  his GI tract. I think to be safe we will proceed with EGD at his soonest convenience. I see no reason for any further blood tests or imaging studies prior to then.

## 2011-02-27 ENCOUNTER — Encounter: Payer: Self-pay | Admitting: Gastroenterology

## 2011-02-28 ENCOUNTER — Other Ambulatory Visit: Payer: Federal, State, Local not specified - PPO | Admitting: Gastroenterology

## 2011-03-17 ENCOUNTER — Telehealth: Payer: Self-pay | Admitting: *Deleted

## 2011-03-17 ENCOUNTER — Encounter: Payer: Self-pay | Admitting: Gastroenterology

## 2011-03-17 ENCOUNTER — Ambulatory Visit (AMBULATORY_SURGERY_CENTER): Payer: Federal, State, Local not specified - PPO | Admitting: Gastroenterology

## 2011-03-17 ENCOUNTER — Telehealth: Payer: Self-pay | Admitting: Gastroenterology

## 2011-03-17 DIAGNOSIS — R1013 Epigastric pain: Secondary | ICD-10-CM

## 2011-03-17 DIAGNOSIS — K219 Gastro-esophageal reflux disease without esophagitis: Secondary | ICD-10-CM

## 2011-03-17 DIAGNOSIS — R079 Chest pain, unspecified: Secondary | ICD-10-CM

## 2011-03-17 DIAGNOSIS — R1310 Dysphagia, unspecified: Secondary | ICD-10-CM

## 2011-03-17 LAB — GLUCOSE, CAPILLARY: Glucose-Capillary: 117 mg/dL — ABNORMAL HIGH (ref 70–99)

## 2011-03-17 MED ORDER — SODIUM CHLORIDE 0.9 % IV SOLN: 500.0000 mL | INTRAVENOUS | Status: DC

## 2011-03-17 NOTE — Telephone Encounter (Signed)
Spoke with patient's wife and she had many questions regarding what he could drink today since he is a diabetic.   I explained out prototcol, and told her not to give him his am dose of metformin this am.   I explained that we would rather have him too high in blood sugar than too low.   She understood and we will see them later this afternoon.

## 2011-03-17 NOTE — Patient Instructions (Signed)
Discharged instructions given with verbal understanding. Normal procedure. Resume previous medications. 

## 2011-03-18 ENCOUNTER — Telehealth: Payer: Self-pay | Admitting: *Deleted

## 2011-03-18 ENCOUNTER — Telehealth: Payer: Self-pay

## 2011-03-18 ENCOUNTER — Telehealth: Payer: Self-pay | Admitting: Gastroenterology

## 2011-03-18 NOTE — Telephone Encounter (Signed)
Returned pts phone call.  Answered questions about d/c instructions and resuming his meds. Pt. Verbalized understanding.

## 2011-03-18 NOTE — Telephone Encounter (Signed)
No ID on answering machine.  No message left. 

## 2011-03-20 ENCOUNTER — Encounter: Payer: Self-pay | Admitting: Cardiovascular Disease

## 2011-03-21 ENCOUNTER — Ambulatory Visit (HOSPITAL_COMMUNITY): Payer: Federal, State, Local not specified - PPO | Attending: Family Medicine | Admitting: Radiology

## 2011-03-21 ENCOUNTER — Ambulatory Visit: Payer: Federal, State, Local not specified - PPO | Admitting: Gastroenterology

## 2011-03-21 ENCOUNTER — Ambulatory Visit (INDEPENDENT_AMBULATORY_CARE_PROVIDER_SITE_OTHER): Payer: Federal, State, Local not specified - PPO | Admitting: Cardiovascular Disease

## 2011-03-21 DIAGNOSIS — R079 Chest pain, unspecified: Secondary | ICD-10-CM

## 2011-03-21 DIAGNOSIS — R011 Cardiac murmur, unspecified: Secondary | ICD-10-CM

## 2011-03-21 NOTE — Progress Notes (Signed)
Exercise Treadmill Test  Pre-Exercise Testing Evaluation Rhythm: normal sinus  Rate: 79   PR:  .17 QRS:  .09  ST Segments:  no significant ST changes at rest     Test  Exercise Tolerance Test Ordering MD: Charlton Haws, MD  Interpreting MD:  Charlton Haws, MD  Unique Test No: 1  Treadmill:  1  Indication for ETT: chest pain - rule out ischemia  Contraindication to ETT: No   Stress Modality: exercise - treadmill  Cardiac Imaging Performed: non   Protocol: standard Bruce - maximal  Max BP:  187/ 62  Max MPHR (bpm): 180 85% MPR (bpm):  153  MPHR obtained (bpm):  173 % MPHR obtained:  110%  Reached 85% MPHR (min:sec):  4:50 Total Exercise Time (min-sec):  6:30  Workload in METS:  10.9 Borg Scale: 20  Reason ETT Terminated:  fatigue    ST Segment Analysis At Rest: normal ST segments - no evidence of significant ST depression With Exercise: no evidence of significant ST depression  Other Information Arrhythmia:  No Angina during ETT:  absent (0) Quality of ETT:  diagnostic  ETT Interpretation:  normal - no evidence of ischemia by ST analysis  Comments: No significant chest pain  Recommendations: Continue weight loss efforts and Rx for GERD F/U Nishan 3 months

## 2011-03-25 ENCOUNTER — Telehealth: Payer: Self-pay | Admitting: *Deleted

## 2011-03-25 NOTE — Telephone Encounter (Signed)
Pt called noting that he continues to have chest trouble and problems with Metformin. Pt has tried taking the pills many different ways; spreading it out or breaking it down (halves) and is still not tolerating the drug well. Pt would like alternative med. Please advise.

## 2011-03-25 NOTE — Telephone Encounter (Signed)
What is his intolerance?  He will need an OV to discuss options.

## 2011-03-25 NOTE — Telephone Encounter (Signed)
Addendum created; original message was closed in error.

## 2011-03-26 NOTE — Telephone Encounter (Signed)
Pt notified and will try and come tomorrow (having transportation issue).

## 2011-03-27 ENCOUNTER — Encounter: Payer: Self-pay | Admitting: Family Medicine

## 2011-03-27 ENCOUNTER — Ambulatory Visit (INDEPENDENT_AMBULATORY_CARE_PROVIDER_SITE_OTHER): Payer: Federal, State, Local not specified - PPO | Admitting: Family Medicine

## 2011-03-27 DIAGNOSIS — M109 Gout, unspecified: Secondary | ICD-10-CM

## 2011-03-27 DIAGNOSIS — E119 Type 2 diabetes mellitus without complications: Secondary | ICD-10-CM

## 2011-03-27 DIAGNOSIS — R079 Chest pain, unspecified: Secondary | ICD-10-CM

## 2011-03-27 NOTE — Progress Notes (Signed)
  Subjective:    Patient ID: Aaron Franco, male    DOB: [birth year], 40 y.o.   MRN: [id number]  HPI Chest pressure- stress test was normal.  Normal endoscopy.  Chest pain is bilateral under collar bone and will travel up into neck.  Will have some shortness of breath.  sxs improve w/ working out.  DM- reports CBGs running 90-120s.  On Metformin, Januvia, Amaryl.  Exercising daily, losing weight, improved diet.  Reports sensation of metformin and amaryl are 'sticking'- difficulty swallowing (but normal endoscopy).  Gout- pt would like to stop his allopurinol b/c he feels that now that he's eating better and exercising he will not need it to control his gout.  Denies current joint pain, inflammation, redness, swelling.  Review of Systems For ROS see HPI .     Objective:   Physical Exam Constitutional: He is oriented to person, place, and time. He appears well-developed and well-nourished. No distress.  HENT:  Head: Normocephalic and atraumatic.  Neck: Normal range of motion. Neck supple.  Cardiovascular: Normal rate, regular rhythm and intact distal pulses.   No murmur heard. Pulmonary/Chest: Breath sounds normal. No respiratory distress. He has no wheezes.  Abdominal: Soft. Bowel sounds are normal. He exhibits no distension. There is no tenderness. There is no rebound.  Musculoskeletal: He exhibits no edema.  Neurological: He is alert and oriented to person, place, and time.  Skin: Skin is warm and dry       Assessment & Plan:

## 2011-03-27 NOTE — Patient Instructions (Signed)
Follow up next month to recheck diabetes STOP the Metformin and the Amaryl Continue the Januvia daily Stop the Allopurinol- we can restart it if we need to Continue all the other meds Call with any questions or concerns Hang in there!!

## 2011-04-04 ENCOUNTER — Telehealth: Payer: Self-pay | Admitting: *Deleted

## 2011-04-04 ENCOUNTER — Ambulatory Visit: Payer: Federal, State, Local not specified - PPO | Admitting: Family Medicine

## 2011-04-04 DIAGNOSIS — R079 Chest pain, unspecified: Secondary | ICD-10-CM

## 2011-04-04 MED ORDER — ROSUVASTATIN CALCIUM 20 MG PO TABS: 20.0000 mg | ORAL_TABLET | Freq: Every day | ORAL | Status: DC

## 2011-04-04 NOTE — Telephone Encounter (Signed)
It is unlikely that lipitor is causing his sxs but he can switch to Crestor 20mg  (please provide samples and coupon card).  Has already had normal stress test- next step is pulmonary referral

## 2011-04-04 NOTE — Telephone Encounter (Signed)
Pt still c/o chest discomfort. Pt notes that discomfort increases when he takes Lipitor. Pt would like to know if cholesterol med can be changed. Please advise

## 2011-04-04 NOTE — Telephone Encounter (Signed)
Discuss with patient  

## 2011-04-08 ENCOUNTER — Telehealth: Payer: Self-pay | Admitting: *Deleted

## 2011-04-08 NOTE — Telephone Encounter (Signed)
According to last phone note he was given Crestor samples and needs to see if switching to this low side effect medication improves his sxs.  As he continues to lose weight and gets regular exercise there may be a chance to stop fenofibrate but ideally not right now.

## 2011-04-08 NOTE — Telephone Encounter (Signed)
Left message to call office

## 2011-04-08 NOTE — Assessment & Plan Note (Signed)
Stop allopurinol at pt's request.  Warned him that this could precipitate gout attack.  Pt aware.  Will follow.

## 2011-04-08 NOTE — Telephone Encounter (Signed)
Pt states that he was speaking with a friend who was also taking the fenofibrate and Lipitor together and was experiencing similar discomfort while on med. Pt would like to know if med could have been reacting to one another. Pt would like to know if it would be possible to take one or the other instead of both the Crestor and fenofibrate . Pt advise that med are focusong on different areas of the cholesterol but would like to still know if it would be possible to D/C fenofibrate or change to another med. Pt aware out of office until tomorrow. Please advise

## 2011-04-08 NOTE — Assessment & Plan Note (Signed)
Pt's CBGs have improved considerably.  Having difficult time swallowing metformin and amaryl.  Would like to stop them if possible.  Will discontinue these in favor of tx w/ Januvia alone but he is aware that if his sugars jump we will need to restart metformin.  Pt expressed understanding and is in agreement w/ plan.

## 2011-04-08 NOTE — Assessment & Plan Note (Signed)
Pt has had normal endoscopy and stress test.  Reassured him that this was great news.  Pt now thinks his chest pressure is due to medications.  Will stop allopurinol,  Metformin, amaryl and see if sxs improve.  Will continue to follow.

## 2011-04-09 ENCOUNTER — Telehealth: Payer: Self-pay | Admitting: Family Medicine

## 2011-04-09 NOTE — Telephone Encounter (Signed)
Discuss with patient  

## 2011-04-09 NOTE — Telephone Encounter (Signed)
noted 

## 2011-04-11 ENCOUNTER — Ambulatory Visit (INDEPENDENT_AMBULATORY_CARE_PROVIDER_SITE_OTHER): Payer: Federal, State, Local not specified - PPO | Admitting: Internal Medicine

## 2011-04-11 ENCOUNTER — Encounter: Payer: Self-pay | Admitting: Internal Medicine

## 2011-04-11 ENCOUNTER — Other Ambulatory Visit (INDEPENDENT_AMBULATORY_CARE_PROVIDER_SITE_OTHER): Payer: Federal, State, Local not specified - PPO

## 2011-04-11 DIAGNOSIS — R5383 Other fatigue: Secondary | ICD-10-CM

## 2011-04-11 DIAGNOSIS — R079 Chest pain, unspecified: Secondary | ICD-10-CM

## 2011-04-11 LAB — SEDIMENTATION RATE: Sed Rate: 20 mm/hr (ref 0–22)

## 2011-04-11 NOTE — Patient Instructions (Signed)
WE will do blood test for autoimmune disease - ANA, ESR , CRP, DS-DNA, RF, CCP antibodies We will call you with results IF these are negative you could decide about watching/waiting versus methacholine challenge for asthma versus 1 month asthma Rx trial Please wait to hear from [place] about test results

## 2011-04-11 NOTE — Progress Notes (Signed)
Subjective:    Patient ID: Aaron Franco, male    DOB: Dec 12, 1970, 40 y.o.   MRN: [id number]  HPI 40 year old morbidly obese (recent 17# intentional weight loss), AA male with metabolic syndrome (Dm, hyperlipidemia, hypertension) referred for atypical chest pain/discomfort. Insidious onset 3-5 months ago. Located in different parts of chest; says it is present in left mammary area -> sternal area, bilateral infraclavicular area or right mammary area. Says it can start at one of these locations and move or just occur in any one location. Ranges from mild-moderate in severity. Occasionally radiates upto neck and collar bone area. Sitting in a particular way such as leaning back makes it better but sitting up or leaning forward can bring it on. Lasts few minutes to half a day. Pain mostly in day but has woken him up at night from sleep. Denies associated wheeze but has rare cough and occassional dyspnea.  Dyspnea itself is non-specific and can occur after eating.  Has seen GI Dr Wendall Papa per hx: ruled out GERD and had normal endoscopy. Has been on nexium trial past 2 months that has not helped. Had cardiac stress test 03/21/2011 (Dr Eden Emms) and echo 03/21/2011 that is normal per history. Note hadrt shoulder surgery in Jan 2012 (but had dyspnea, chest pain starting 1 month before that). Had one ER visit to [facility] per hx few days after shoulder surgery - per hx CT chest was normal and ruled out blood clot. Then had 2nd ER visit  to [facility] on 12/23/2010 for same: CXR was normal and troponin x 2 was normal (personally reviewed). Had normal cbcd, bmet mid-march 2012. Of note: remembers being on symbicort for unclear reasons several years ago    Review of Systems  Constitutional: Negative for fever and unexpected weight change.  HENT: Negative for ear pain, nosebleeds, congestion, sore throat, rhinorrhea, sneezing, trouble swallowing, dental problem, postnasal drip and sinus pressure.   Eyes:  Negative for redness and itching.  Respiratory: Negative for cough, chest tightness, shortness of breath and wheezing.   Cardiovascular: Positive for chest pain and leg swelling. Negative for palpitations.  Gastrointestinal: Negative for nausea and vomiting.  Genitourinary: Negative for dysuria.  Musculoskeletal: Negative for joint swelling.  Skin: Negative for rash.  Neurological: Negative for headaches.  Hematological: Does not bruise/bleed easily.  Psychiatric/Behavioral: Negative for dysphoric mood. The patient is not nervous/anxious.   Denies snoring or excess day time somnolence.     Objective:   Physical Exam  Nursing note and vitals reviewed. Constitutional: He is oriented to person, place, and time. He appears well-developed and well-nourished. No distress.       obese  HENT:  Head: Normocephalic and atraumatic.  Right Ear: External ear normal.  Left Ear: External ear normal.  Mouth/Throat: Oropharynx is clear and moist. No oropharyngeal exudate.  Eyes: Conjunctivae and EOM are normal. Pupils are equal, round, and reactive to light. Right eye exhibits no discharge. Left eye exhibits no discharge. No scleral icterus.  Neck: Normal range of motion. Neck supple. No JVD present. No tracheal deviation present. No thyromegaly present.  Cardiovascular: Normal rate, regular rhythm and intact distal pulses.  Exam reveals no gallop and no friction rub.   No murmur heard. Pulmonary/Chest: Effort normal and breath sounds normal. No respiratory distress. He has no wheezes. He has no rales. He exhibits no tenderness.  Abdominal: Soft. Bowel sounds are normal. He exhibits no distension and no mass. There is no tenderness. There is  no rebound and no guarding.  Musculoskeletal: Normal range of motion. He exhibits no edema and no tenderness.  Lymphadenopathy:    He has no cervical adenopathy.  Neurological: He is alert and oriented to person, place, and time. He has normal reflexes. No cranial  nerve deficit. Coordination normal.  Skin: Skin is warm and dry. No rash noted. He is not diaphoretic. No erythema. No pallor.  Psychiatric: He has a normal mood and affect. His behavior is normal. Judgment and thought content normal.          Assessment & Plan:

## 2011-04-12 LAB — C-REACTIVE PROTEIN: CRP: 0.8 mg/dL — ABNORMAL HIGH (ref <0.6)

## 2011-04-12 LAB — RHEUMATOID FACTOR: Rhuematoid fact SerPl-aCnc: <10 IU/mL (ref <=14)

## 2011-04-14 ENCOUNTER — Encounter: Payer: Self-pay | Admitting: Internal Medicine

## 2011-04-14 LAB — CYCLIC CITRUL PEPTIDE ANTIBODY, IGG: Cyclic Citrullin Peptide Ab: <2 U/mL (ref 0.0–5.0)

## 2011-04-14 LAB — ANA: Anti Nuclear Antibody(ANA): NEGATIVE

## 2011-04-14 NOTE — Assessment & Plan Note (Signed)
Chest pain is very atypical. Cardiac etiology has been ruled out. I am not sure there is a pulmonary etiology to this. We discussed rare possibilities of autoimmune disease and asthma though pre-test prob for both is very low. He is agreeable to have serum work for autoimmune disease but is reluctant to have methacholine challenge test for asthma  PLAN WE will do blood test for autoimmune disease - ANA, ESR , CRP, DS-DNA, RF, CCP antibodies We will call you with results IF these are negative you could decide about watching/waiting versus methacholine challenge for asthma versus 1 month asthma Rx trial Please wait to hear from [place] about test results

## 2011-04-15 ENCOUNTER — Telehealth: Payer: Self-pay | Admitting: Internal Medicine

## 2011-04-15 ENCOUNTER — Other Ambulatory Visit: Payer: Self-pay | Admitting: Family Medicine

## 2011-04-15 ENCOUNTER — Telehealth: Payer: Self-pay | Admitting: *Deleted

## 2011-04-15 NOTE — Telephone Encounter (Signed)
Pt states that he is currently out of [place] today and would like to know if he can take his old metformin which he has left over since he recently change to [place] so that he will not be without med today.Please advise   Pt would like to know if he can change to another med bedside januvia for his DM or if not maybe try metformin again.  Pt BS running 89-129, fasting 101,115,125.

## 2011-04-15 NOTE — Telephone Encounter (Signed)
REsulted only today so that is why he did not get cal yesterday. All negative; so no autoimmune disease to explain chest symptoms. HE should now decide if he wants the methacholine challenge test or not

## 2011-04-15 NOTE — Telephone Encounter (Signed)
MR, pt requesting results of labs done on Friday.  Pls advise.  Thanks!

## 2011-04-15 NOTE — Telephone Encounter (Signed)
Metformin and [place] are totally different medications.   We can give him samples if he needs some until he comes in for labs.

## 2011-04-15 NOTE — Telephone Encounter (Signed)
Per Dr Laury Axon can take for tonight since he is out of med but Dr Beverely Low will have to address changing to alternate med. Discuss with patient

## 2011-04-16 MED ORDER — SITAGLIPTIN PHOSPHATE 100 MG PO TABS: 100.0000 mg | ORAL_TABLET | Freq: Every day | ORAL | Status: DC

## 2011-04-16 NOTE — Telephone Encounter (Signed)
He did not like the Metformin or Glipizide.  Januvia or Actos are the only remaining oral meds.  He can do Victoza if he is interested in doing shots and avoiding pills altogether

## 2011-04-16 NOTE — Telephone Encounter (Signed)
Please send script for Januvia 100mg  w/ 3 refills.  He needs to take this daily and should not allow himself to run out.  He was the one who said he wanted to stop Metformin but he needs to be on some medicine for his diabetes.

## 2011-04-16 NOTE — Telephone Encounter (Signed)
Based on his labs, pt needs to take the whole pill.  He can take a Januvia/Metformin combo pill or them separately but he needs to take something and take it every day

## 2011-04-16 NOTE — Telephone Encounter (Signed)
In order to avoid injections the pt asked if he could play with Metformin and Januvia and see which one works best for him. I reminded the pt that Metformin was previously d/c due to his request, so it would be best to pick one med and stick with it. Pt would like to know if he can take a lesser dosage of the Januvia or maybe take just half of the pill. Please advise.

## 2011-04-16 NOTE — Telephone Encounter (Signed)
Called, spoke with pt.  He is aware of lab results per MR.  States he will discuss the methacholine challenge test with his wife and call back to let us know whether or not he wishes to proceed with this.

## 2011-04-16 NOTE — Telephone Encounter (Signed)
Per pt request I sent rx for Januvia to the pharmacy. At this time he does not want to do injections or the combo pill. Pt is now questioning if cholesterol meds is causing his symptoms. Pt will discuss these items with his wife as well as referral to endo. He will call back if referral is needed.

## 2011-04-16 NOTE — Telephone Encounter (Signed)
Pt notified. Pt notes that he does not want to do the combo pill due to the size. And would like to narrow down his amount of pills. I offered that the pt either stick with Januvia, try Victoza, or can be referred to endo.

## 2011-04-16 NOTE — Telephone Encounter (Signed)
Pt notified and does not want to do shots.

## 2011-04-16 NOTE — Telephone Encounter (Signed)
Pt notified and notes that the Januvia does the same as the Metformin (causes discomfort). Pt is requesting to try a new medication all together. I informed the pt that we are running out of meds to try, but would ask MD anyway. Please advise.

## 2011-04-25 ENCOUNTER — Encounter: Payer: Self-pay | Admitting: Family Medicine

## 2011-04-28 ENCOUNTER — Other Ambulatory Visit: Payer: Federal, State, Local not specified - PPO | Admitting: Family Medicine

## 2011-04-28 ENCOUNTER — Other Ambulatory Visit (INDEPENDENT_AMBULATORY_CARE_PROVIDER_SITE_OTHER): Payer: Federal, State, Local not specified - PPO

## 2011-04-28 DIAGNOSIS — E785 Hyperlipidemia, unspecified: Secondary | ICD-10-CM

## 2011-04-28 DIAGNOSIS — E119 Type 2 diabetes mellitus without complications: Secondary | ICD-10-CM

## 2011-04-28 LAB — HEPATIC FUNCTION PANEL
AST: 24 U/L (ref 0–37)
Alkaline Phosphatase: 53 U/L (ref 39–117)
Bilirubin, Direct: 0.1 mg/dL (ref 0.0–0.3)
Total Bilirubin: 0.5 mg/dL (ref 0.3–1.2)

## 2011-04-28 LAB — BASIC METABOLIC PANEL
BUN: 15 mg/dL (ref 6–23)
CO2: 24 mEq/L (ref 19–32)
Calcium: 9.7 mg/dL (ref 8.4–10.5)
Chloride: 99 mEq/L (ref 96–112)
Creatinine, Ser: 0.9 mg/dL (ref 0.4–1.5)
Glucose, Bld: 140 mg/dL — ABNORMAL HIGH (ref 70–99)

## 2011-04-28 LAB — LIPID PANEL
LDL Cholesterol: 112 mg/dL — ABNORMAL HIGH (ref 0–99)
Total CHOL/HDL Ratio: 6
Triglycerides: 134 mg/dL (ref 0.0–149.0)

## 2011-04-28 NOTE — Progress Notes (Signed)
Labs only

## 2011-04-29 ENCOUNTER — Encounter: Payer: Self-pay | Admitting: *Deleted

## 2011-05-13 ENCOUNTER — Other Ambulatory Visit: Payer: Self-pay | Admitting: Family Medicine

## 2011-05-13 NOTE — Telephone Encounter (Signed)
Refill sent.

## 2011-06-25 ENCOUNTER — Ambulatory Visit: Payer: Federal, State, Local not specified - PPO | Admitting: Cardiovascular Disease

## 2011-08-22 ENCOUNTER — Ambulatory Visit: Payer: Federal, State, Local not specified - PPO | Admitting: *Deleted

## 2011-09-15 NOTE — Telephone Encounter (Signed)
done

## 2011-09-16 NOTE — Telephone Encounter (Signed)
done

## 2011-10-13 ENCOUNTER — Encounter: Payer: Federal, State, Local not specified - PPO | Attending: Internal Medicine | Admitting: *Deleted

## 2011-10-13 ENCOUNTER — Encounter: Payer: Self-pay | Admitting: *Deleted

## 2011-10-13 DIAGNOSIS — Z713 Dietary counseling and surveillance: Secondary | ICD-10-CM | POA: Insufficient documentation

## 2011-10-13 DIAGNOSIS — E119 Type 2 diabetes mellitus without complications: Secondary | ICD-10-CM | POA: Insufficient documentation

## 2011-10-13 DIAGNOSIS — E669 Obesity, unspecified: Secondary | ICD-10-CM

## 2011-10-13 DIAGNOSIS — E785 Hyperlipidemia, unspecified: Secondary | ICD-10-CM

## 2011-10-13 NOTE — Patient Instructions (Signed)
Instructions:  Aim for 3-4 Carb Choices / meal (45 - 60 gram) and 2 carb choices per snack if hungry Continue to test BG 1-2 times a day, pre and post meal, also pre and post exercise occasionally Read food labels for total carbohydrate of foods Aim for 1800 calories per day for weight loss Continue with walking and gym for weight loss and improve insulin resistance

## 2011-10-14 NOTE — Progress Notes (Signed)
  Medical Nutrition Therapy:  Appt start time: 1700 end time:  1800.   Assessment:  Primary concerns today: Patient here with his wife who appears supportive. He states he has had Type 2 Diabetes for 2 years and would like an update on managing it as well as Carb Counting review. He is out of work on disability due to a shoulder injury in January, 2012 from [employer]. He states he checks his BG 1-2 times daily both pre and post meal. For the last 2 months he reports BGs in the 200-350 range.    MEDICATIONS: see list. Diabetes meds were added today, currently on JanuMet    DIETARY INTAKE:  Usual eating pattern includes 3 meals and 0-1 snacks per day.  Everyday foods include good variety of most food groups.  Avoided foods include coffee, milk and pork.    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   Nutritional Diagnosis:  NI-5.8.4 Inconsistent carbohydrate intake As related to obesity and Type 2 Diabetes.  As evidenced by A1c of 7.2 and BMI of 44.9.    Intervention:  Nutrition counseling provided on pathophysiology of Type 2 Diabetes, rationale of consistent carb meals suggesting 3-4 Carb choices (60 grams) per meal and 2 Carb choices/snack if hungry. Also discussed value of self monitoring his BG at various times per day to assess food choices and activity level on BG levels. Encouraged continuing current activity  plan! Instructions: Aim for 3-4 Carb Choices / meal (45 - 60 gram) and 2 carb choices per snack if hungry Continue to test BG 1-2 times a day, pre and post meal, also pre and post exercise occasionally Read food labels for total carbohydrate of foods Aim for 1800 calories per day for weight loss Continue with walking and gym for weight loss and improve insulin resistance Handouts given during visit include:  Living Well With Type 2 Diabetes  Carb Counting and Beyond Handout  Food Label Handout  Monitoring/Evaluation:  Dietary intake, exercise, self monitoring of BG twice a day, and body weight prn. Patient to call for appointment as needed.

## 2012-04-26 ENCOUNTER — Telehealth: Payer: Self-pay | Admitting: Family Medicine

## 2012-04-26 NOTE — Telephone Encounter (Signed)
Refill: Januvia 100mg  tablets. Take 1 tablet by mouth daily. Qty 30. Last fill 03-11-12

## 2012-04-27 MED ORDER — SITAGLIPTIN PHOSPHATE 100 MG PO TABS: 100.0000 mg | ORAL_TABLET | Freq: Every day | ORAL | Status: DC

## 2012-04-27 NOTE — Telephone Encounter (Signed)
rx sent to pharmacy by e-script for #30 no refills Letter has been mailed to pt address noted in the chart to advise they are overdue for cpe/ov/labs and the pt needs to contact office to set up appt  NEEDS CPE,

## 2012-08-18 IMAGING — CR DG CHEST 2V
2 series · 2 of 2 positions shown · non-contrast
Comparison: None.

CLINICAL DATA: Chest pain and shortness of breath.

CHEST - 2 VIEW

[w chest pa]
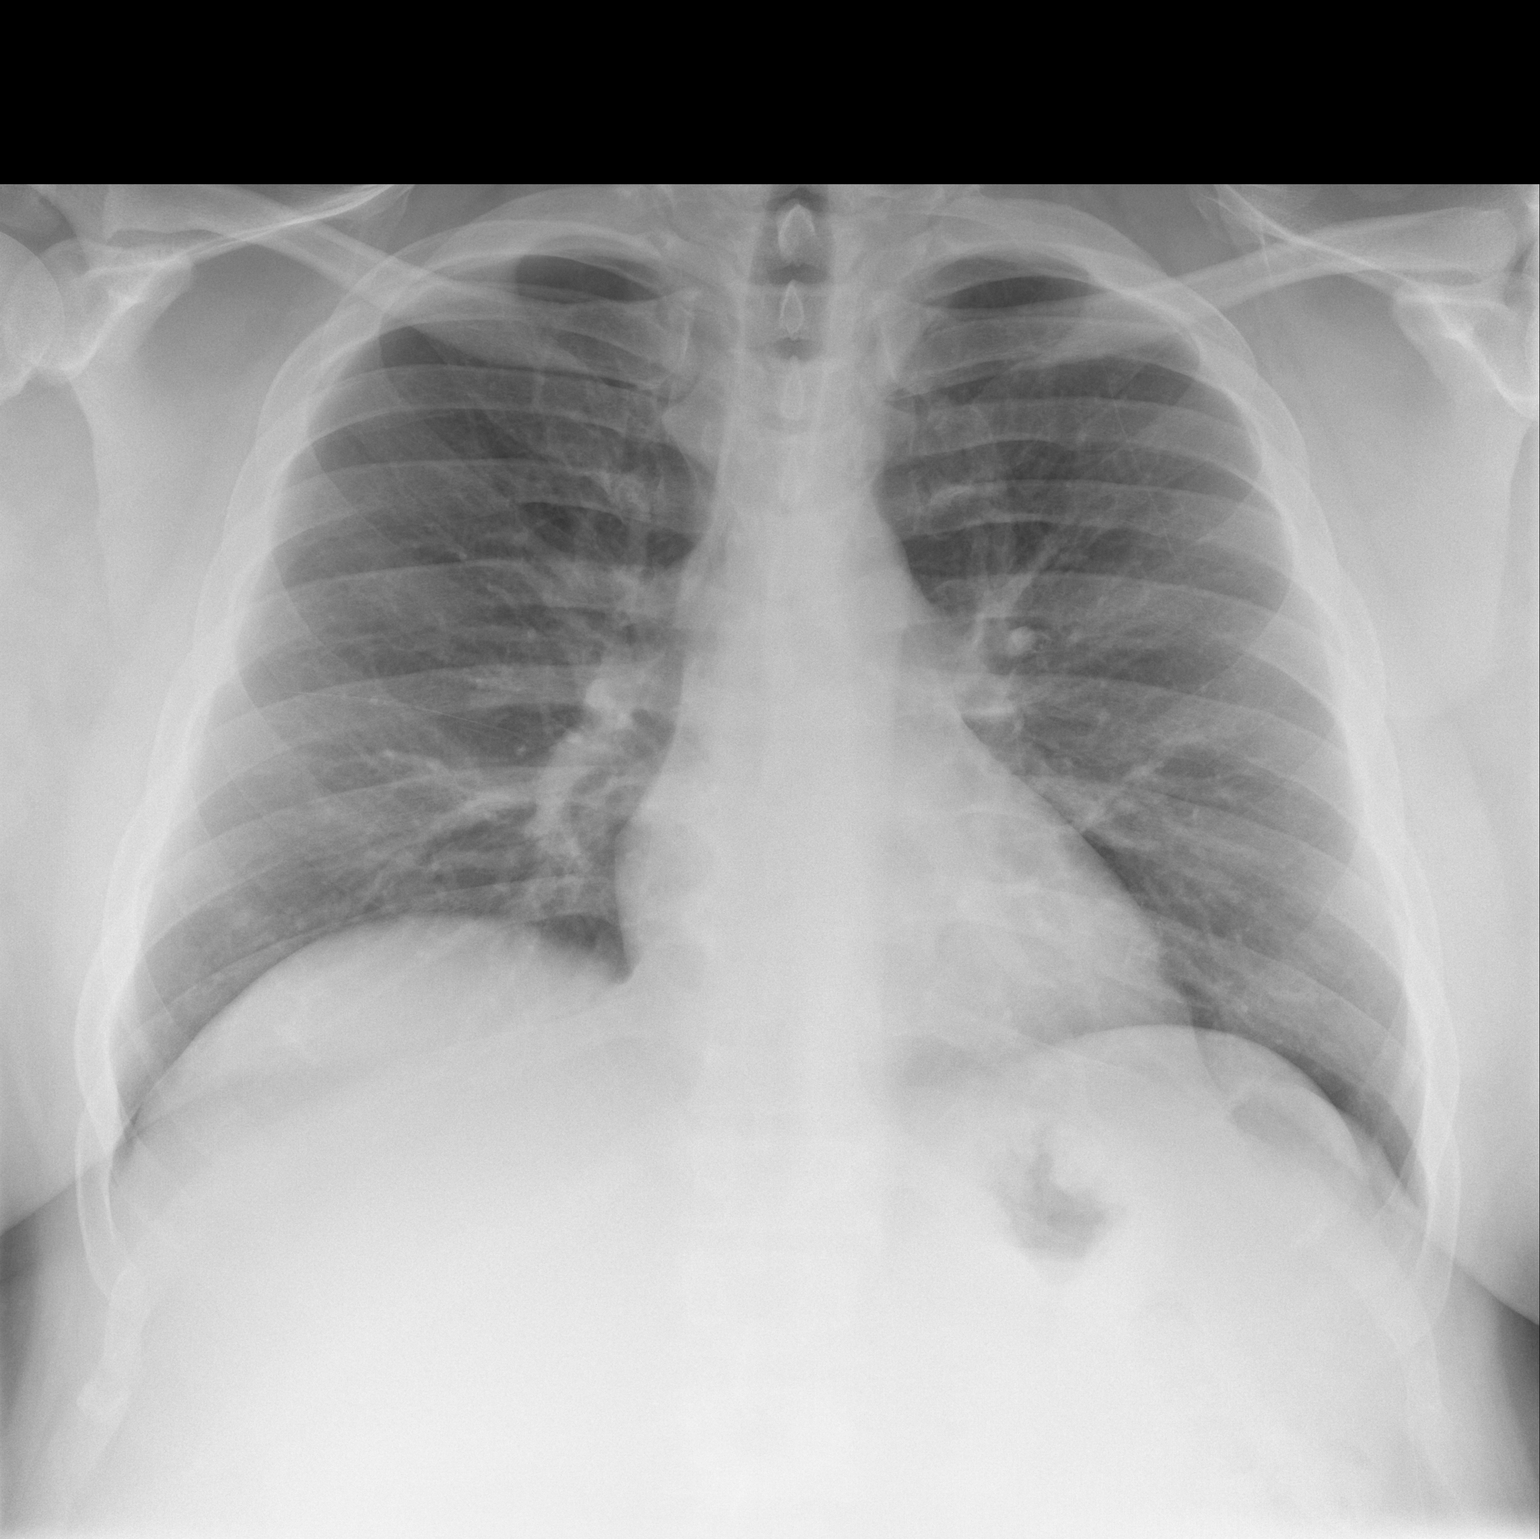

[w chest lat *]
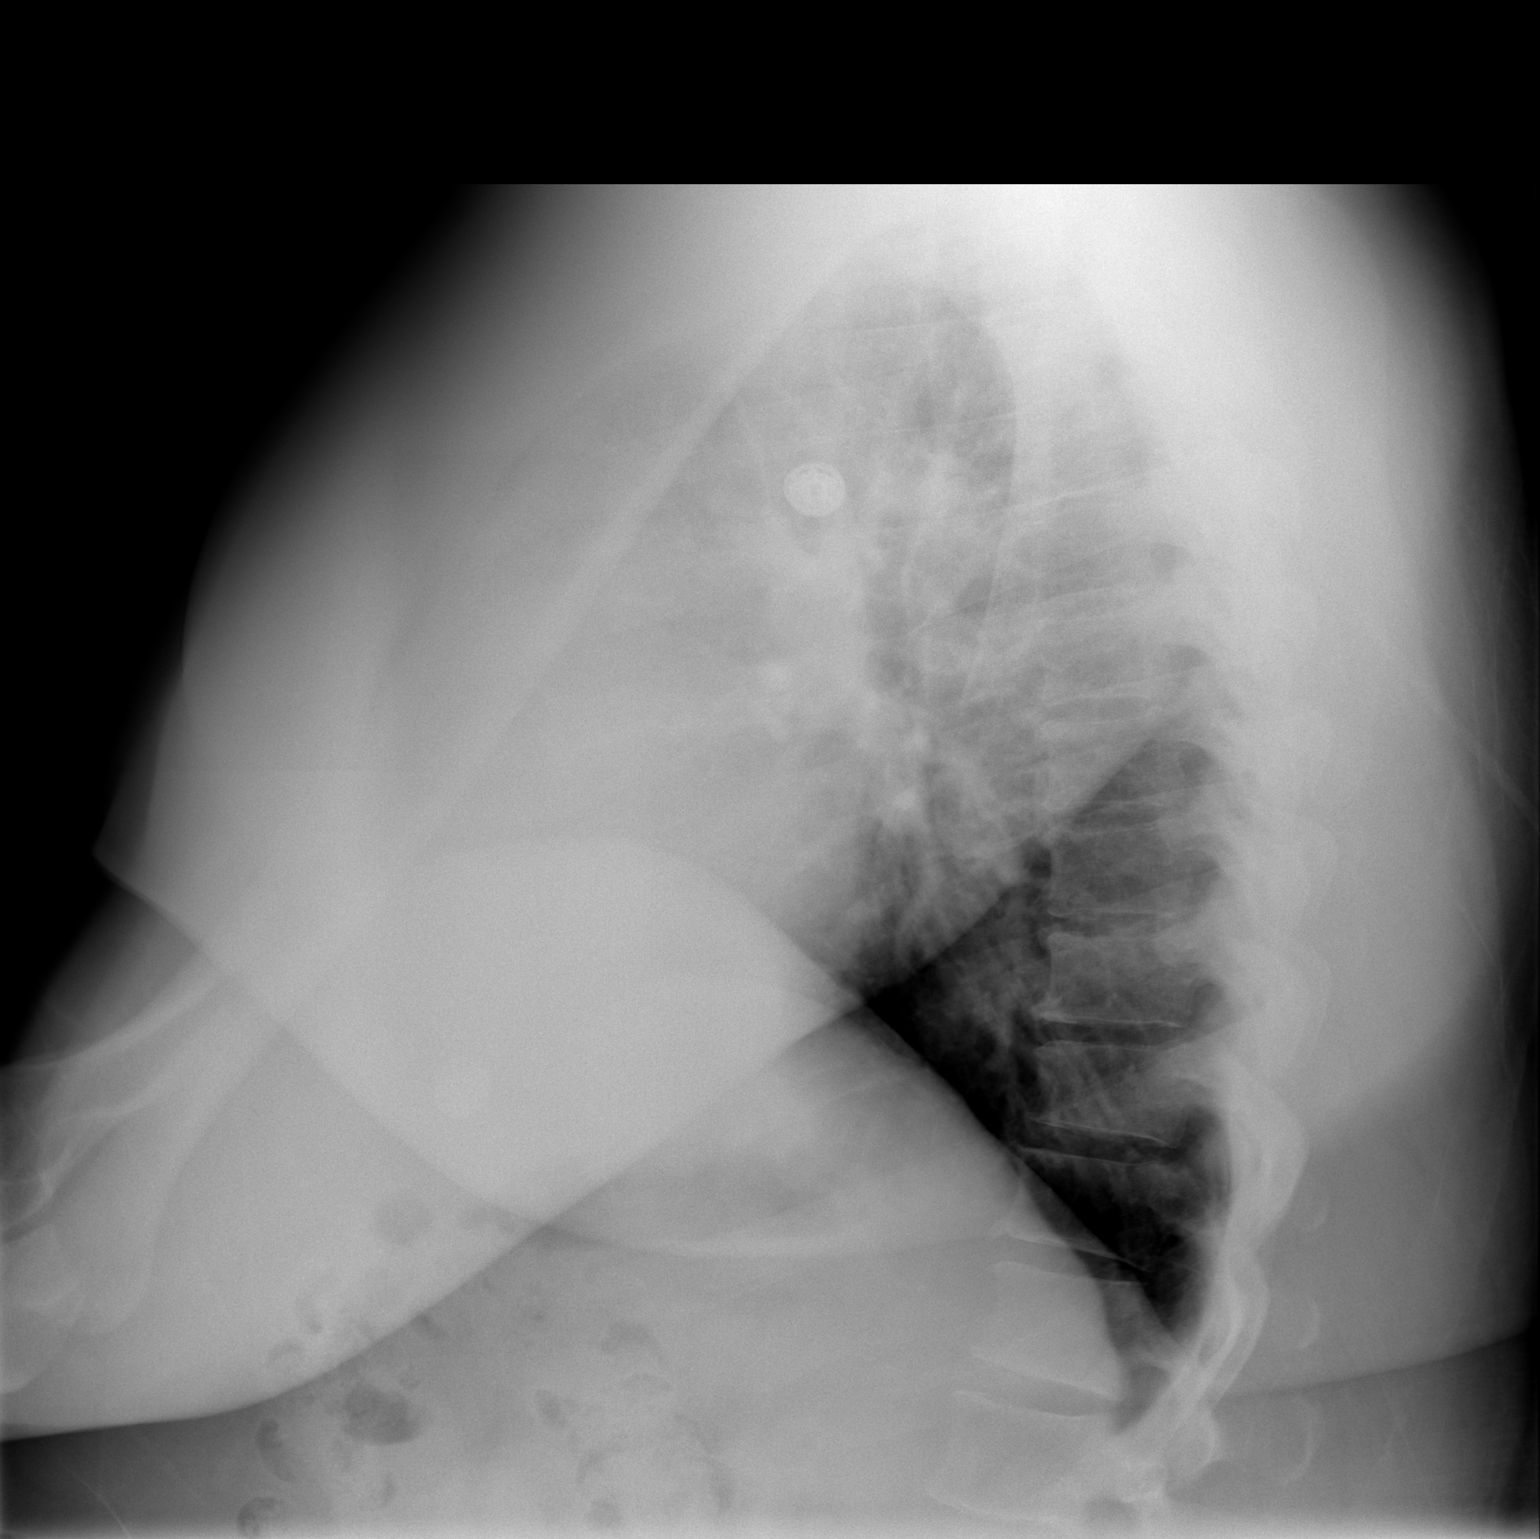

[2 of 2 positions shown; findings below may reference images not displayed]

FINDINGS: Lungs are clear.  Heart size is normal.  No pneumothorax
or pleural effusion.
IMPRESSION: No acute disease.

## 2013-05-10 HISTORY — PX: PERCUTANEOUS CORONARY STENT INTERVENTION (PCI-S): SHX6016

## 2013-05-12 ENCOUNTER — Encounter (HOSPITAL_COMMUNITY)
Admission: EM | Disposition: A | Discharge status: Home / Self Care | Payer: Self-pay | Source: Home / Self Care | Attending: Cardiovascular Disease

## 2013-05-12 ENCOUNTER — Ambulatory Visit (HOSPITAL_COMMUNITY): Admit: 2013-05-12 | Payer: Self-pay | Admitting: Interventional Cardiology

## 2013-05-12 ENCOUNTER — Inpatient Hospital Stay (HOSPITAL_BASED_OUTPATIENT_CLINIC_OR_DEPARTMENT_OTHER)
Admission: EM | Admit: 2013-05-12 | Discharge: 2013-05-14 | DRG: 853 | Disposition: A | Discharge status: Home / Self Care | Payer: Federal, State, Local not specified - PPO | Attending: Cardiovascular Disease | Admitting: Cardiovascular Disease

## 2013-05-12 ENCOUNTER — Encounter (HOSPITAL_BASED_OUTPATIENT_CLINIC_OR_DEPARTMENT_OTHER): Payer: Self-pay | Admitting: *Deleted

## 2013-05-12 ENCOUNTER — Emergency Department (HOSPITAL_BASED_OUTPATIENT_CLINIC_OR_DEPARTMENT_OTHER): Payer: Federal, State, Local not specified - PPO

## 2013-05-12 DIAGNOSIS — I214 Non-ST elevation (NSTEMI) myocardial infarction: Principal | ICD-10-CM | POA: Diagnosis present

## 2013-05-12 DIAGNOSIS — Z87891 Personal history of nicotine dependence: Secondary | ICD-10-CM

## 2013-05-12 DIAGNOSIS — Z955 Presence of coronary angioplasty implant and graft: Secondary | ICD-10-CM

## 2013-05-12 DIAGNOSIS — I2582 Chronic total occlusion of coronary artery: Secondary | ICD-10-CM | POA: Diagnosis present

## 2013-05-12 DIAGNOSIS — K219 Gastro-esophageal reflux disease without esophagitis: Secondary | ICD-10-CM | POA: Diagnosis present

## 2013-05-12 DIAGNOSIS — Z6841 Body Mass Index (BMI) 40.0 and over, adult: Secondary | ICD-10-CM

## 2013-05-12 DIAGNOSIS — I10 Essential (primary) hypertension: Secondary | ICD-10-CM | POA: Diagnosis present

## 2013-05-12 DIAGNOSIS — I213 ST elevation (STEMI) myocardial infarction of unspecified site: Secondary | ICD-10-CM

## 2013-05-12 DIAGNOSIS — I2510 Atherosclerotic heart disease of native coronary artery without angina pectoris: Secondary | ICD-10-CM | POA: Diagnosis present

## 2013-05-12 DIAGNOSIS — E119 Type 2 diabetes mellitus without complications: Secondary | ICD-10-CM | POA: Diagnosis present

## 2013-05-12 DIAGNOSIS — M109 Gout, unspecified: Secondary | ICD-10-CM | POA: Diagnosis present

## 2013-05-12 DIAGNOSIS — E785 Hyperlipidemia, unspecified: Secondary | ICD-10-CM | POA: Diagnosis present

## 2013-05-12 HISTORY — PX: PERCUTANEOUS CORONARY STENT INTERVENTION (PCI-S): SHX5485

## 2013-05-12 HISTORY — DX: Atherosclerotic heart disease of native coronary artery without angina pectoris: I25.10

## 2013-05-12 HISTORY — PX: LEFT HEART CATH: SHX5478

## 2013-05-12 LAB — COMPREHENSIVE METABOLIC PANEL
ALT: 23 U/L (ref 0–53)
AST: 18 U/L (ref 0–37)
CO2: 27 mEq/L (ref 19–32)
Calcium: 10.3 mg/dL (ref 8.4–10.5)
Chloride: 97 mEq/L (ref 96–112)
GFR calc non Af Amer: >90 mL/min (ref >90)
Potassium: 4.1 mEq/L (ref 3.5–5.1)
Sodium: 136 mEq/L (ref 135–145)

## 2013-05-12 LAB — APTT: aPTT: 25 seconds (ref 24–37)

## 2013-05-12 LAB — CBC
Platelets: 294 10*3/uL (ref 150–400)
RBC: 4.96 MIL/uL (ref 4.22–5.81)
WBC: 14 10*3/uL — ABNORMAL HIGH (ref 4.0–10.5)

## 2013-05-12 SURGERY — LEFT HEART CATH
Anesthesia: LOCAL

## 2013-05-12 MED ORDER — NITROGLYCERIN 0.4 MG SL SUBL
0.4000 mg | SUBLINGUAL_TABLET | SUBLINGUAL | Status: AC | PRN
  Administered 2013-05-12 (×3): 0.4 mg via SUBLINGUAL
  Filled 2013-05-12: qty 25

## 2013-05-12 MED ORDER — SODIUM CHLORIDE 0.9 % IV SOLN
20.0000 mL | INTRAVENOUS | Status: DC
  Administered 2013-05-12: 20 mL via INTRAVENOUS

## 2013-05-12 MED ORDER — ASPIRIN 81 MG PO CHEW
CHEWABLE_TABLET | ORAL | Status: AC
  Administered 2013-05-12: 324 mg via ORAL
  Filled 2013-05-12: qty 4

## 2013-05-12 MED ORDER — ASPIRIN 81 MG PO CHEW: 324.0000 mg | CHEWABLE_TABLET | Freq: Once | ORAL | Status: AC

## 2013-05-12 NOTE — ED Notes (Addendum)
EMS on scene to transport pt to cath lab- Elliot Gurney, RN charge nurse at [facility] made aware of pt transfer- attempt x 2 to call cath lab no answer

## 2013-05-12 NOTE — ED Provider Notes (Signed)
History    CSN: [id number] Arrival date & time 05/12/13  2221  First MD Initiated Contact with Patient 05/12/13 2248     Chief Complaint  Patient presents with  . Chest Pain   (Consider location/radiation/quality/duration/timing/severity/associated sxs/prior Treatment) HPI  42 y.o. Male h.o. Dm, hypertension, complaining of chest pain began between 7-8 pm to 8/10 with radiation to left chest.  Pain improved to 5/10.  Pain radiated to left side of chest and describes as pressure.  Also with pressure in left upper back.  Denies diaphoresis, Takes januvia, flexipen 10 units,  Patient vomited once.  Some dyspnea.  Does not take aspirin.  Did not take any meds except prescribed.   Past Medical History  Diagnosis Date  . Diabetes mellitus   . GERD (gastroesophageal reflux disease)   . Gout   . Hyperlipidemia   . Hypertension   . Obesity    Past Surgical History  Procedure Laterality Date  . Biceps tendon repair  Nov 13 2010    right  . Hand surgery  1990    left   Family History  Problem Relation Age of Onset  . Heart attack Father     deceased  . Stroke Mother   . Diabetes Father   . Pancreatic cancer Maternal Aunt   . Colon cancer Neg Hx   . Heart failure Father    History  Substance Use Topics  . Smoking status: Former Smoker    Types: Cigarettes    Quit date: 11/10/1998  . Smokeless tobacco: Never Used     Comment: pt only smoked 1-2 cigs a day, not everyday on occs x 2 years  . Alcohol Use: Yes     Comment: rare    Review of Systems  All other systems reviewed and are negative.    Allergies  Prednisone  Home Medications   Current Outpatient Rx  Name  Route  Sig  Dispense  Refill  . allopurinol (ZYLOPRIM) 300 MG tablet               . amLODipine (NORVASC) 10 MG tablet   Oral   Take 10 mg by mouth daily.           . colchicine 0.6 MG tablet   Oral   Take 0.6 mg by mouth 2 (two) times daily. Then decrease to daily a symptoms allow           . dexlansoprazole (DEXILANT) 60 MG capsule   Oral   Take 60 mg by mouth daily.         Marland Kitchen DIOVAN 160 MG tablet      TAKE 1 TABLET BY MOUTH EVERY DAY   30 tablet   0   . fenofibrate 160 MG tablet   Oral   Take 160 mg by mouth daily.           . insulin detemir (LEVEMIR) 100 UNIT/ML injection   Subcutaneous   Inject 60 Units into the skin daily.         . rosuvastatin (CRESTOR) 20 MG tablet   Oral   Take 1 tablet (20 mg total) by mouth at bedtime.   30 tablet   2   . sitaGLIPtin (JANUVIA) 100 MG tablet   Oral   Take 1 tablet (100 mg total) by mouth daily.   30 tablet   0   . cyanocobalamin (,VITAMIN B-12,) 1000 MCG/ML injection   Intramuscular   Inject 1,000 mcg into the muscle  every 30 (thirty) days.           Marland Kitchen esomeprazole (NEXIUM) 40 MG capsule   Oral   Take 40 mg by mouth daily before breakfast.           . glimepiride (AMARYL) 2 MG tablet               . Glucose Blood (ONETOUCH ULTRA BLUE VI)      Test strips and lancets as directed dx: 250.00          . HYDROcodone-acetaminophen (VICODIN) 5-500 MG per tablet   Oral   Take 1 tablet by mouth every 6 (six) hours as needed.           . indomethacin (INDOCIN) 25 MG capsule   Oral   Take 25 mg by mouth 3 (three) times daily with meals. Can increase to 2 capsules three times a day as needed          . Simethicone (GAS-X PO)   Oral   Take by mouth as needed.           . triamcinolone (NASACORT AQ) 55 MCG/ACT nasal inhaler   Nasal   2 sprays by Nasal route daily.            BP 154/92  Pulse 85  Temp(Src) 98 F (36.7 C) (Oral)  Resp 18  Ht 6' (1.829 m)  Wt 315 lb (142.883 kg)  BMI 42.71 kg/m2  SpO2 99% Physical Exam  Nursing note and vitals reviewed. Constitutional: He is oriented to person, place, and time. He appears well-developed and well-nourished.  obese  HENT:  Head: Normocephalic and atraumatic.  Right Ear: External ear normal.  Left Ear: External ear normal.   Nose: Nose normal.  Mouth/Throat: Oropharynx is clear and moist.  Eyes: Conjunctivae and EOM are normal. Pupils are equal, round, and reactive to light.  Neck: Normal range of motion. Neck supple.  Cardiovascular: Normal rate, regular rhythm, normal heart sounds and intact distal pulses.   Pulmonary/Chest: Effort normal and breath sounds normal. No respiratory distress. He has no wheezes. He exhibits no tenderness.  Abdominal: Soft. Bowel sounds are normal. He exhibits no distension and no mass. There is no tenderness. There is no guarding.  Musculoskeletal: Normal range of motion.  Neurological: He is alert and oriented to person, place, and time. He has normal reflexes. He exhibits normal muscle tone. Coordination normal.  Skin: Skin is warm and dry.  Psychiatric: He has a normal mood and affect. His behavior is normal. Judgment and thought content normal.    ED Course  Procedures (including critical care time) Labs Reviewed - No data to display No results found. No diagnosis found.  MDM   Date: 05/12/2013  Rate: 94  Rhythm: normal sinus rhythm  QRS Axis: normal  Intervals: normal  ST/T Wave abnormalities: ST elevations inferiorly  Conduction Disutrbances:st elevation about 1 mm II, II, avf  Narrative Interpretation:   Old EKG Reviewed: changes noted, st elevation increased from prior ekg of 12/23/10   STEMI activated.  Discussed with Dr. Katrinka Blazing and patient being transported to [facility] cath lab.  EMS is here to transport.   Hilario Quarry, MD 05/12/13 [phone]

## 2013-05-12 NOTE — ED Notes (Signed)
Pt report central chest discomfort between 7-8pm this evening- reports feeling sob- pain also radiated into left chest- reports he "took all his meds" together this evening prior to pain starting

## 2013-05-13 ENCOUNTER — Encounter (HOSPITAL_COMMUNITY): Payer: Self-pay | Admitting: *Deleted

## 2013-05-13 DIAGNOSIS — I219 Acute myocardial infarction, unspecified: Secondary | ICD-10-CM

## 2013-05-13 DIAGNOSIS — R079 Chest pain, unspecified: Secondary | ICD-10-CM

## 2013-05-13 LAB — URINALYSIS, ROUTINE W REFLEX MICROSCOPIC
Bilirubin Urine: NEGATIVE
Leukocytes, UA: NEGATIVE
Nitrite: NEGATIVE
Specific Gravity, Urine: <1.005 — ABNORMAL LOW (ref 1.005–1.030)
pH: 7.5 (ref 5.0–8.0)

## 2013-05-13 LAB — BASIC METABOLIC PANEL
CO2: 23 mEq/L (ref 19–32)
Chloride: 100 mEq/L (ref 96–112)
Glucose, Bld: 178 mg/dL — ABNORMAL HIGH (ref 70–99)
Potassium: 3.7 mEq/L (ref 3.5–5.1)
Sodium: 137 mEq/L (ref 135–145)

## 2013-05-13 LAB — URINE MICROSCOPIC-ADD ON

## 2013-05-13 LAB — CBC
Hemoglobin: 12.9 g/dL — ABNORMAL LOW (ref 13.0–17.0)
MCH: 26.9 pg (ref 26.0–34.0)
RBC: 4.79 MIL/uL (ref 4.22–5.81)
WBC: 11.6 10*3/uL — ABNORMAL HIGH (ref 4.0–10.5)

## 2013-05-13 LAB — GLUCOSE, CAPILLARY: Glucose-Capillary: 183 mg/dL — ABNORMAL HIGH (ref 70–99)

## 2013-05-13 LAB — MRSA PCR SCREENING: MRSA by PCR: NEGATIVE

## 2013-05-13 MED ORDER — ENOXAPARIN SODIUM 40 MG/0.4ML ~~LOC~~ SOLN
40.0000 mg | SUBCUTANEOUS | Status: DC
  Administered 2013-05-14: 40 mg via SUBCUTANEOUS
  Filled 2013-05-13 (×2): qty 0.4

## 2013-05-13 MED ORDER — ALLOPURINOL 100 MG PO TABS
100.0000 mg | ORAL_TABLET | Freq: Every day | ORAL | Status: DC
  Administered 2013-05-14 (×2): 100 mg via ORAL
  Filled 2013-05-13 (×2): qty 1

## 2013-05-13 MED ORDER — COLCHICINE 0.6 MG PO TABS
0.6000 mg | ORAL_TABLET | Freq: Once | ORAL | Status: AC
Start: 2013-05-13 — End: 2013-05-14
  Administered 2013-05-14: 0.6 mg via ORAL
  Filled 2013-05-13: qty 1

## 2013-05-13 MED ORDER — TICAGRELOR 90 MG PO TABS
90.0000 mg | ORAL_TABLET | Freq: Two times a day (BID) | ORAL | Status: DC
  Administered 2013-05-13 – 2013-05-14 (×3): 90 mg via ORAL
  Filled 2013-05-13 (×4): qty 1

## 2013-05-13 MED ORDER — CARVEDILOL 6.25 MG PO TABS
6.2500 mg | ORAL_TABLET | Freq: Two times a day (BID) | ORAL | Status: DC
  Administered 2013-05-13 – 2013-05-14 (×2): 6.25 mg via ORAL
  Filled 2013-05-13 (×4): qty 1

## 2013-05-13 MED ORDER — FAMOTIDINE IN NACL 20-0.9 MG/50ML-% IV SOLN
INTRAVENOUS | Status: AC
  Filled 2013-05-13: qty 50

## 2013-05-13 MED ORDER — FENTANYL CITRATE 0.05 MG/ML IJ SOLN
INTRAMUSCULAR | Status: AC
  Filled 2013-05-13: qty 2

## 2013-05-13 MED ORDER — INSULIN DETEMIR 100 UNIT/ML ~~LOC~~ SOLN
60.0000 [IU] | Freq: Every day | SUBCUTANEOUS | Status: DC
  Administered 2013-05-13 – 2013-05-14 (×2): 60 [IU] via SUBCUTANEOUS
  Filled 2013-05-13 (×2): qty 0.6

## 2013-05-13 MED ORDER — ATORVASTATIN CALCIUM 20 MG PO TABS
20.0000 mg | ORAL_TABLET | Freq: Every day | ORAL | Status: DC
  Administered 2013-05-13: 20 mg via ORAL
  Filled 2013-05-13 (×2): qty 1

## 2013-05-13 MED ORDER — NITROGLYCERIN 0.2 MG/ML ON CALL CATH LAB
INTRAVENOUS | Status: AC
  Filled 2013-05-13: qty 1

## 2013-05-13 MED ORDER — HEPARIN (PORCINE) IN NACL 2-0.9 UNIT/ML-% IJ SOLN
INTRAMUSCULAR | Status: AC
  Filled 2013-05-13: qty 1000

## 2013-05-13 MED ORDER — BIVALIRUDIN 250 MG IV SOLR
INTRAVENOUS | Status: AC
  Filled 2013-05-13: qty 250

## 2013-05-13 MED ORDER — VERAPAMIL HCL 2.5 MG/ML IV SOLN
INTRAVENOUS | Status: AC
  Filled 2013-05-13: qty 2

## 2013-05-13 MED ORDER — PANTOPRAZOLE SODIUM 40 MG PO TBEC
40.0000 mg | DELAYED_RELEASE_TABLET | Freq: Every day | ORAL | Status: DC
  Administered 2013-05-13 – 2013-05-14 (×2): 40 mg via ORAL
  Filled 2013-05-13 (×2): qty 1

## 2013-05-13 MED ORDER — OXYCODONE-ACETAMINOPHEN 5-325 MG PO TABS: 1.0000 | ORAL_TABLET | ORAL | Status: DC | PRN

## 2013-05-13 MED ORDER — LIDOCAINE HCL (PF) 1 % IJ SOLN
INTRAMUSCULAR | Status: AC
  Filled 2013-05-13: qty 30

## 2013-05-13 MED ORDER — ACETAMINOPHEN 325 MG PO TABS: 650.0000 mg | ORAL_TABLET | ORAL | Status: DC | PRN

## 2013-05-13 MED ORDER — SODIUM CHLORIDE 0.9 % IV SOLN: INTRAVENOUS | Status: AC

## 2013-05-13 MED ORDER — SODIUM CHLORIDE 0.9 % IV SOLN
0.2500 mg/kg/h | INTRAVENOUS | Status: AC
  Administered 2013-05-13: 0.25 mg/kg/h via INTRAVENOUS
  Filled 2013-05-13: qty 250

## 2013-05-13 MED ORDER — MIDAZOLAM HCL 2 MG/2ML IJ SOLN
INTRAMUSCULAR | Status: AC
  Filled 2013-05-13: qty 2

## 2013-05-13 MED ORDER — ASPIRIN 81 MG PO CHEW
81.0000 mg | CHEWABLE_TABLET | Freq: Every day | ORAL | Status: DC
  Administered 2013-05-13 – 2013-05-14 (×2): 81 mg via ORAL
  Filled 2013-05-13 (×2): qty 1

## 2013-05-13 MED ORDER — ZOLPIDEM TARTRATE 5 MG PO TABS
10.0000 mg | ORAL_TABLET | Freq: Every evening | ORAL | Status: DC | PRN
  Administered 2013-05-14: 10 mg via ORAL
  Filled 2013-05-13: qty 2

## 2013-05-13 MED ORDER — ONDANSETRON HCL 4 MG/2ML IJ SOLN: 4.0000 mg | Freq: Four times a day (QID) | INTRAMUSCULAR | Status: DC | PRN

## 2013-05-13 NOTE — CV Procedure (Addendum)
Diagnostic Cardiac Catheterization and Emergency PCI Report  Aaron Franco  42 y.o.  male [birth year]  Procedure Date: 05/13/2013 Referring Physician: [facility] Primary Cardiologist: Dr. Charyl Dancer   PROCEDURE:  Left heart catheterization with selective coronary angiography, left ventriculogram.  INDICATIONS:   Acute coronary syndrome/non-ST elevation MI.   The details of the procedure were explained to the patient.  The patient verbalized understanding but had concerns..  He had not been informed of why he was being transferred to [facility].   In the cath lab he became ambivalent about whether to proceed with angiography. The risks and benefits of the procedure were discussed in detail. He eventually called his wife and they talked about his situation for a while. He eventually decided to proceed after an extended period of consideration (greater than 30-40 minutes). Informed written consent was obtained.  PROCEDURE TECHNIQUE:  After Xylocaine anesthesia a  6 French sheath was placed in the right radial artery with a single anterior needle wall stick.   Coronary angiography was done using a  in 6 French A2 MP and JL 3.5 catheter.  Left ventriculography was done using a  a 6 [place] A2 MP catheter by hand injection. He received 7000 units of heparin, weight-based.  After reviewing the images it became apparent that the mid circumflex was totally occluded after the second obtuse marginal.  We switched to a CLS 6 French 3.5 cm guide catheter. He was bolused with  Angiomax followed by an infusion. Unfortunately an ACT was never performed.  He received and Brilinta 180 mg orally.  A BMW wire was then used to cross the stenosis in the mid circumflex. A 2.5 mm x 15 mm balloon was used for angioplasty of the total occlusion.  We reestablished blood flow and subsequently positioned and deployed a 3.0 x 20 mm long Promus Premier drug-eluting stent to 10 atmospheres. A 15 mm  long by 3.0 mm diameter [employer] balloon was used for post dilatation to 13 atmospheres. TIMI grade 3 flow was noted.    CONTRAST:  Total of  280 cc.  COMPLICATIONS:  None.    HEMODYNAMICS:  Aortic pressure was  122/80 mmHg; LV pressure was  131/12 mmHg ; LVEDP  18 mmHg .  There was no gradient between the left ventricle and aorta.    ANGIOGRAPHIC DATA:   The left main coronary artery is  widely patent .  The left anterior descending artery is  widely patent and transapical. Several regions of luminal irregularities are noted in the proximal and mid vessel. A large diagonal arises from the mid LAD. No significant obstruction is noted. .  The left circumflex artery is  is large and gives origin to a large first and second obtuse marginal branches. Each vessel has moderate luminal irregularity within the mid segments. After the second obtuse marginal the circumflex is totally occluded. .  The right coronary artery is  codominant containing mid 40% stenosis.   LEFT VENTRICULOGRAM:  Left ventricular angiogram was done in the 30 RAO projection and revealed normal left ventricular wall motion and systolic function with an estimated ejection fraction of 65%.  LVEDP was 18 mmHg.  PCI REPORT:  The totally occluded mid circumflex was reduced to 0% after angioplasty and stenting as outlined above. No complications occurred. Drug-eluting stent was implanted.  IMPRESSIONS:  1. Non ST elevation MI due to total occlusion of the mid to distal circumflex supplying a small distribution.  2.  Successful drug-eluting stent implantation in the mid to distal circumflex reducing the stenosis from 100% to 0% with TIMI grade 3 flow  3. Widely patent LAD, proximal circumflex, and right coronary. The LAD, circumflex, and mid right coronary contains moderate luminal irregularities.  4. Normal overall left ventricular function.   RECOMMENDATION:  1. Aspirin and Brilinta  2. Risk factor modification  3. Continue  Angiomax times 60 minutes at a reduced rate.  4. May be a candidate for early discharge if no complications.

## 2013-05-13 NOTE — H&P (Signed)
Cardiology History and Physical  Neena Rhymes, MD  History of Present Illness (and review of medical records): Aaron Franco is a 42 y.o. male who presents for evaluation of chest pain.  He has no hx of prior MI or known CAD.  He does have hx of HTN, DM on insulin, HLD, Obesity with prior negative evaluation for chest pain in 2012.  He now presents with 8/10 left sided chest pain that started around 7pm tonight.  Pain radiated to back and left arm.  EMS was called and he was given 4 ASA, Nitro, and Morphine.  His pain has decreased but continues to have left sided chest discomfort that radiates to back.  His initial ecg was concerning for ST elevation in inferior leads.  CODE STEMI was activated and he was transported to Cath lab.  Previous diagnostic testing for coronary artery disease includes: stress echo. Previous history of cardiac disease includes Chest Pain. Coronary artery disease risk factors include: diabetes mellitus, dyslipidemia, hypertension, male gender and obesity (BMI >= 30 kg/m2). Patient denies history of arrhythmia, cardiomyopathy, previous M.I. and valvular disease.  Review of Systems Pertinent items are noted in HPI.  Patient Active Problem List   Diagnosis Date Noted  . Murmur 02/21/2011  . SINUSITIS - ACUTE-NOS 01/09/2011  . CHEST PAIN 12/27/2010  . EDEMA- LOCALIZED 12/24/2010  . SHOULDER PAIN 10/16/2010  . VITAMIN B12 DEFICIENCY 07/19/2010  . OBESITY 07/19/2010  . DECREASED LIBIDO 07/19/2010  . STRAIN, CHEST WALL 07/19/2010  . OTHER VITAMIN B12 DEFICIENCY ANEMIA 10/31/2009  . PAIN IN JOINT, HAND 10/16/2009  . TRIGGER FINGER 10/16/2009  . BACK STRAIN, LUMBAR 10/16/2009  . DM 08/17/2009  . RHINITIS 02/06/2009  . HYPERLIPIDEMIA 03/01/2007  . GOUT 03/01/2007  . HYPERTENSION 03/01/2007  . GERD 03/01/2007   Past Medical History  Diagnosis Date  . Diabetes mellitus   . GERD (gastroesophageal reflux disease)   . Gout   . Hyperlipidemia   .  Hypertension   . Obesity     Past Surgical History  Procedure Laterality Date  . Biceps tendon repair  Nov 13 2010    right  . Hand surgery  1990    left    Facility-administered medications prior to admission  Medication Dose Route Frequency Provider Last Rate Last Dose  . 0.9 %  sodium chloride infusion  500 mL Intravenous Continuous Rachael Fee, MD       Prescriptions prior to admission  Medication Sig Dispense Refill  . allopurinol (ZYLOPRIM) 300 MG tablet       . amLODipine (NORVASC) 10 MG tablet Take 10 mg by mouth daily.        . colchicine 0.6 MG tablet Take 0.6 mg by mouth 2 (two) times daily. Then decrease to daily a symptoms allow       . dexlansoprazole (DEXILANT) 60 MG capsule Take 60 mg by mouth daily.      Marland Kitchen DIOVAN 160 MG tablet TAKE 1 TABLET BY MOUTH EVERY DAY  30 tablet  0  . fenofibrate 160 MG tablet Take 160 mg by mouth daily.        . insulin detemir (LEVEMIR) 100 UNIT/ML injection Inject 60 Units into the skin daily.      . rosuvastatin (CRESTOR) 20 MG tablet Take 1 tablet (20 mg total) by mouth at bedtime.  30 tablet  2  . sitaGLIPtin (JANUVIA) 100 MG tablet Take 1 tablet (100 mg total) by mouth daily.  30 tablet  0  .  cyanocobalamin (,VITAMIN B-12,) 1000 MCG/ML injection Inject 1,000 mcg into the muscle every 30 (thirty) days.        Marland Kitchen esomeprazole (NEXIUM) 40 MG capsule Take 40 mg by mouth daily before breakfast.        . glimepiride (AMARYL) 2 MG tablet       . Glucose Blood (ONETOUCH ULTRA BLUE VI) Test strips and lancets as directed dx: 250.00       . HYDROcodone-acetaminophen (VICODIN) 5-500 MG per tablet Take 1 tablet by mouth every 6 (six) hours as needed.        . indomethacin (INDOCIN) 25 MG capsule Take 25 mg by mouth 3 (three) times daily with meals. Can increase to 2 capsules three times a day as needed       . Simethicone (GAS-X PO) Take by mouth as needed.        . triamcinolone (NASACORT AQ) 55 MCG/ACT nasal inhaler 2 sprays by Nasal route  daily.         Allergies  Allergen Reactions  . Prednisone     Mood swings    History  Substance Use Topics  . Smoking status: Former Smoker    Types: Cigarettes    Quit date: 11/10/1998  . Smokeless tobacco: Never Used     Comment: pt only smoked 1-2 cigs a day, not everyday on occs x 2 years  . Alcohol Use: Yes     Comment: rare    Family History  Problem Relation Age of Onset  . Heart attack Father     deceased  . Stroke Mother   . Diabetes Father   . Pancreatic cancer Maternal Aunt   . Colon cancer Neg Hx   . Heart failure Father      Objective:  Patient Vitals for the past 8 hrs:  BP Temp Temp src Pulse Resp SpO2 Height Weight  05/12/13 2315 - - - 100 - 98 % - -  05/12/13 2310 133/72 mmHg - - 103 12 99 % - -  05/12/13 2307 - - - 102 - 98 % - -  05/12/13 2306 138/66 mmHg - - 98 - - - -  05/12/13 2305 143/85 mmHg - - 99 16 99 % - -  05/12/13 2300 138/66 mmHg - - 100 25 99 % - -  05/12/13 2255 146/78 mmHg - - 98 18 100 % - -  05/12/13 2250 - - - 99 21 100 % - -  05/12/13 2245 - - - 101 - 98 % - -  05/12/13 2240 - - - 93 - 99 % - -  05/12/13 2235 154/92 mmHg 98 F (36.7 C) Oral 85 18 99 % 6' (1.829 m) 142.883 kg (315 lb)   General appearance: alert, cooperative, obese male in mild distress Head: Normocephalic, without obvious abnormality, atraumatic Eyes: conjunctivae/corneas clear. PERRL, EOM's intact. Fundi benign. Neck: no carotid bruit, no JVD and supple, symmetrical, trachea midline Lungs: clear to auscultation bilaterally Chest wall: no tenderness Heart: regular rate and rhythm, S1, S2 normal, no murmur, click, rub or gallop Abdomen: soft, non-tender; bowel sounds normal; no masses,  no organomegaly Extremities: trace edema Pulses: 2+ and symmetric Neurologic: Grossly normal  Results for orders placed during the hospital encounter of 05/12/13 (from the past 48 hour(s))  CBC     Status: Abnormal   Collection Time    05/12/13 11:00 PM      Result  Value Range   WBC 14.0 (*) 4.0 - 10.5 K/uL  RBC 4.96  4.22 - 5.81 MIL/uL   Hemoglobin 13.7  13.0 - 17.0 g/dL   HCT 16.1  09.6 - 04.5 %   MCV 82.5  78.0 - 100.0 fL   MCH 27.6  26.0 - 34.0 pg   MCHC 33.5  30.0 - 36.0 g/dL   RDW 40.9  81.1 - 91.4 %   Platelets 294  150 - 400 K/uL  COMPREHENSIVE METABOLIC PANEL     Status: Abnormal   Collection Time    05/12/13 11:00 PM      Result Value Range   Sodium 136  135 - 145 mEq/L   Potassium 4.1  3.5 - 5.1 mEq/L   Chloride 97  96 - 112 mEq/L   CO2 27  19 - 32 mEq/L   Glucose, Bld 188 (*) 70 - 99 mg/dL   BUN 11  6 - 23 mg/dL   Creatinine, Ser 7.82  0.50 - 1.35 mg/dL   Calcium 95.6  8.4 - 21.3 mg/dL   Total Protein 8.3  6.0 - 8.3 g/dL   Albumin 4.3  3.5 - 5.2 g/dL   AST 18  0 - 37 U/L   ALT 23  0 - 53 U/L   Alkaline Phosphatase 75  39 - 117 U/L   Total Bilirubin 0.3  0.3 - 1.2 mg/dL   GFR calc non Af Amer >90  >90 mL/min   GFR calc Af Amer >90  >90 mL/min   Comment:            The eGFR has been calculated     using the CKD EPI equation.     This calculation has not been     validated in all clinical     situations.     eGFR's persistently     <90 mL/min signify     possible Chronic Kidney Disease.  PROTIME-INR     Status: None   Collection Time    05/12/13 11:00 PM      Result Value Range   Prothrombin Time 13.0  11.6 - 15.2 seconds   INR 1.00  0.00 - 1.49  APTT     Status: None   Collection Time    05/12/13 11:00 PM      Result Value Range   aPTT 25  24 - 37 seconds  TROPONIN I     Status: None   Collection Time    05/12/13 11:37 PM      Result Value Range   Troponin I <0.30  <0.30 ng/mL   Comment:            Due to the release kinetics of cTnI,     a negative result within the first hours     of the onset of symptoms does not rule out     myocardial infarction with certainty.     If myocardial infarction is still suspected,     repeat the test at appropriate intervals.   Dg Chest Portable 1 View  05/12/2013    *RADIOLOGY REPORT*  Clinical Data: Chest pain.  PORTABLE CHEST - 1 VIEW  Comparison: PA and lateral chest 12/23/2010.  Findings: Right costophrenic angle is off the margin of the film. Lungs are clear.  Heart size is normal.  No pneumothorax or pleural fluid.  IMPRESSION: Negative chest.   Original Report Authenticated By: Holley Dexter, M.D.    ECG:  Sinus rhythm HR 94 1mm ST elevation in inferior leads with ST depression in 1, and  avl, these changes are new when compared with prior ecg. Prior ecgs do have some j point elevation in multiple leads, but mild changes above appear acute.  Assessment: 75M with HTN, HLD, DM, morbid obesity presents with acute onset of persistent chest pain associated with mild ST changes on ecg concerning for Acute coronary syndrome.   Plan:  1. Patient EKG not clearly STEMI, however, has some mild changes that appear acute with persistent pain after ASA, Nitro, and Morphine.  Will plan for cardiac catheterization with Dr. Katrinka Blazing. 2. Admit to ICU/Stepdown post procedure. 3. Repeat ekg on admit, prn chest pain or arrythmia 4. Trend cardiac biomarkers, check lipids, hgba1c, tsh 5. Medical management with DAPT, BB, ARB, Statin, Nitro prn 6. SSI for DM

## 2013-05-13 NOTE — Progress Notes (Signed)
Patient ID: Aaron Franco, male   DOB: 1971/05/09, 42 y.o.   MRN: [id number]    Subjective:  Denies SSCP, palpitations or Dyspnea   Objective:  Filed Vitals:   05/13/13 0400 05/13/13 0500 05/13/13 0600 05/13/13 0738  BP: 111/63 116/67 134/76 145/82  Pulse: 75 85 83 77  Temp:    98.1 F (36.7 C)  TempSrc:    Oral  Resp:    18  Height:      Weight:      SpO2: 95% 95% 98% 97%    Intake/Output from previous day:  Intake/Output Summary (Last 24 hours) at 05/13/13 0819 Last data filed at 05/13/13 0700  Gross per 24 hour  Intake 1157.1 ml  Output   2400 ml  Net -1242.9 ml    Physical Exam: Affect appropriate Obese black male HEENT: normal Neck supple with no adenopathy JVP normal no bruits no thyromegaly Lungs clear with no wheezing and good diaphragmatic motion Heart:  S1/S2 no murmur, no rub, gallop or click PMI normal Abdomen: benighn, BS positve, no tenderness, no AAA no bruit.  No HSM or HJR Distal pulses intact with no bruits No edema Neuro non-focal Skin warm and dry No muscular weakness Right radial cath site A   Lab Results: Basic Metabolic Panel:  Recent Labs  78/29/56 2300 05/13/13 0545  NA 136 137  K 4.1 3.7  CL 97 100  CO2 27 23  GLUCOSE 188* 178*  BUN 11 9  CREATININE 0.80 0.69  CALCIUM 10.3 9.5   Liver Function Tests:  Recent Labs  05/12/13 2300  AST 18  ALT 23  ALKPHOS 75  BILITOT 0.3  PROT 8.3  ALBUMIN 4.3   No results found for this basename: LIPASE, AMYLASE,  in the last 72 hours CBC:  Recent Labs  05/12/13 2300 05/13/13 0545  WBC 14.0* 11.6*  HGB 13.7 12.9*  HCT 40.9 39.0  MCV 82.5 81.4  PLT 294 284   Cardiac Enzymes:  Recent Labs  05/12/13 2337 05/13/13 0545  TROPONINI <0.30 >20.00*    Imaging: Dg Chest Portable 1 View  05/12/2013   *RADIOLOGY REPORT*  Clinical Data: Chest pain.  PORTABLE CHEST - 1 VIEW  Comparison: PA and lateral chest 12/23/2010.  Findings: Right costophrenic angle is off the margin  of the film. Lungs are clear.  Heart size is normal.  No pneumothorax or pleural fluid.  IMPRESSION: Negative chest.   Original Report Authenticated By: Holley Dexter, M.D.    Cardiac Studies:  ECG: NSR rate 79 normal   Telemetry: NSR no VT 05/13/2013   Echo:   Medications:   . aspirin  81 mg Oral Daily  . [START ON 05/14/2013] enoxaparin (LOVENOX) injection  40 mg Subcutaneous Q24H  . Ticagrelor  90 mg Oral BID     . sodium chloride 20 mL (05/12/13 2310)  . sodium chloride 125 mL/hr at 05/13/13 0215    Assessment/Plan:  MI:  Single vessel circumflex disease.  Excellent result with Dr Katrinka Blazing.  ECG normal.  Add low dose beta blocker and continue DAT Candidate for early discharge tomorrow DM:  Add back insulin levemir 60 units in am Chol:  Start statin  Charlton Haws 05/13/2013, 8:19 AM

## 2013-05-14 ENCOUNTER — Encounter (HOSPITAL_COMMUNITY): Payer: Self-pay | Admitting: Physician Assistant

## 2013-05-14 ENCOUNTER — Other Ambulatory Visit: Payer: Self-pay | Admitting: Physician Assistant

## 2013-05-14 DIAGNOSIS — I2510 Atherosclerotic heart disease of native coronary artery without angina pectoris: Secondary | ICD-10-CM

## 2013-05-14 LAB — GLUCOSE, CAPILLARY: Glucose-Capillary: 166 mg/dL — ABNORMAL HIGH (ref 70–99)

## 2013-05-14 MED ORDER — NITROGLYCERIN 0.4 MG SL SUBL: 0.4000 mg | SUBLINGUAL_TABLET | SUBLINGUAL | Status: DC | PRN

## 2013-05-14 MED ORDER — TICAGRELOR 90 MG PO TABS: 90.0000 mg | ORAL_TABLET | Freq: Two times a day (BID) | ORAL | Status: DC

## 2013-05-14 MED ORDER — CARVEDILOL 6.25 MG PO TABS: 6.2500 mg | ORAL_TABLET | Freq: Two times a day (BID) | ORAL | Status: DC

## 2013-05-14 MED ORDER — ASPIRIN 81 MG PO CHEW: 81.0000 mg | CHEWABLE_TABLET | Freq: Every day | ORAL | Status: AC

## 2013-05-14 NOTE — Progress Notes (Signed)
Patient ID: Aaron Franco, male   DOB: 1971/04/28, 42 y.o.   MRN: [id number]    Subjective:  Denies SSCP, palpitations or Dyspnea   Objective:  Filed Vitals:   05/14/13 0500 05/14/13 0515 05/14/13 0600 05/14/13 0700  BP:  123/78 116/71 118/78  Pulse: 73 76 74 69  Temp:      TempSrc:      Resp:      Height:      Weight:      SpO2: 94% 92% 92% 94%    Intake/Output from previous day:  Intake/Output Summary (Last 24 hours) at 05/14/13 0747 Last data filed at 05/13/13 2300  Gross per 24 hour  Intake   1335 ml  Output   2400 ml  Net  -1065 ml    Physical Exam: Affect appropriate Obese black male HEENT: normal Neck supple with no adenopathy JVP normal no bruits no thyromegaly Lungs clear with no wheezing and good diaphragmatic motion Heart:  S1/S2 no murmur, no rub, gallop or click PMI normal Abdomen: benighn, BS positve, no tenderness, no AAA no bruit.  No HSM or HJR Distal pulses intact with no bruits No edema Neuro non-focal Skin warm and dry No muscular weakness Right radial cath site A   Lab Results: Basic Metabolic Panel:  Recent Labs  40/98/11 2300 05/13/13 0545  NA 136 137  K 4.1 3.7  CL 97 100  CO2 27 23  GLUCOSE 188* 178*  BUN 11 9  CREATININE 0.80 0.69  CALCIUM 10.3 9.5   Liver Function Tests:  Recent Labs  05/12/13 2300  AST 18  ALT 23  ALKPHOS 75  BILITOT 0.3  PROT 8.3  ALBUMIN 4.3   No results found for this basename: LIPASE, AMYLASE,  in the last 72 hours CBC:  Recent Labs  05/12/13 2300 05/13/13 0545  WBC 14.0* 11.6*  HGB 13.7 12.9*  HCT 40.9 39.0  MCV 82.5 81.4  PLT 294 284   Cardiac Enzymes:  Recent Labs  05/12/13 2337 05/13/13 0545  TROPONINI <0.30 >20.00*    Imaging: Dg Chest Portable 1 View  05/12/2013   *RADIOLOGY REPORT*  Clinical Data: Chest pain.  PORTABLE CHEST - 1 VIEW  Comparison: PA and lateral chest 12/23/2010.  Findings: Right costophrenic angle is off the margin of the film. Lungs are clear.   Heart size is normal.  No pneumothorax or pleural fluid.  IMPRESSION: Negative chest.   Original Report Authenticated By: Holley Dexter, M.D.    Cardiac Studies:  ECG: NSR rate 79 normal   Telemetry: NSR no VT 05/14/2013   Echo:   Medications:   . allopurinol  100 mg Oral Daily  . aspirin  81 mg Oral Daily  . atorvastatin  20 mg Oral q1800  . carvedilol  6.25 mg Oral BID WC  . enoxaparin (LOVENOX) injection  40 mg Subcutaneous Q24H  . insulin detemir  60 Units Subcutaneous Daily  . pantoprazole  40 mg Oral Daily  . Ticagrelor  90 mg Oral BID     . sodium chloride 20 mL (05/12/13 2310)    Assessment/Plan:  MI:  Single vessel circumflex disease.  Excellent result with Dr Katrinka Blazing.  ECG normal.  Discharge home F/U with me 6-8 weeks DM:  Add back insulin levemir 60 units in am Chol:  Start statin  Charlton Haws 05/14/2013, 7:47 AM

## 2013-05-14 NOTE — Progress Notes (Signed)
CARDIAC REHAB PHASE I   PRE:  Rate/Rhythm: 77SR  BP:  Supine: 108/59  Sitting:   Standing:    SaO2: 98%RA  MODE:  Ambulation: 600 ft   POST:  Rate/Rhythm: 92SR  BP:  Supine:   Sitting: 134/71  Standing:    SaO2: 99%RA [id number] Pt walked 600 ft with steady gait. Tolerated well. Did c/o slight lightheadedness during walk. To recliner after walk. Education completed with pt and wife. Understanding voiced. Discussed CRP 2 and pt agreed to referral to [facility] Phase 2. Gave pt brilinta packet. Discussed diabetic and heart healthy diets.   Luetta Nutting, RN BSN  05/14/2013 9:28 AM

## 2013-05-14 NOTE — Discharge Summary (Signed)
Discharge Summary   Patient ID: Aaron Franco, MRN: [id number], DOB/AGE: 43-Dec-1972 43 y.o.  Admit date: 05/12/2013 Discharge date: 05/14/2013   Primary Care Physician:  Neena Rhymes   Primary Cardiologist:  Dr. Charlton Haws   Reason for Admission:  Non-STEMI  Primary Discharge Diagnoses:  1. Non-STEMI - s/p DES to CFX this admission 2. CAD 3. Hypertension 4. Hyperlipidemia 5. Diabetes Mellitus  Secondary Discharge Diagnoses:   Past Medical History  Diagnosis Date  . Diabetes mellitus   . GERD (gastroesophageal reflux disease)   . Gout   . Hyperlipidemia   . Hypertension   . Obesity   . CAD (coronary artery disease)     a. NSTEMI 7/14:  LHC with mCFX 100% => Promus premier DES; EF 65%     Allergies:    Allergies  Allergen Reactions  . Prednisone     Mood swings      Procedures Performed This Admission:    1.  Cardiac Catheterization and Percutaneous Coronary Intervention 05/13/13:  ANGIOGRAPHIC DATA:  The left main coronary artery is widely patent .  The left anterior descending artery is widely patent and transapical. Several regions of luminal irregularities are noted in the proximal and mid vessel. A large diagonal arises from the mid LAD. No significant obstruction is noted. .  The left circumflex artery is is large and gives origin to a large first and second obtuse marginal branches. Each vessel has moderate luminal irregularity within the mid segments. After the second obtuse marginal the circumflex is totally occluded.    ** PCI:  3.0 x 20 mm long Promus Premier drug-eluting stent ** The right coronary artery is codominant containing mid 40% stenosis.   LEFT VENTRICULOGRAM: Left ventricular angiogram was done in the 30 RAO projection and revealed normal left ventricular wall motion and systolic function with an estimated ejection fraction of 65%. LVEDP was 18 mmHg.   IMPRESSIONS:  1. Non ST elevation MI due to total occlusion of the mid to distal  circumflex supplying a small distribution.  2. Successful drug-eluting stent implantation in the mid to distal circumflex reducing the stenosis from 100% to 0% with TIMI grade 3 flow  3. Widely patent LAD, proximal circumflex, and right coronary. The LAD, circumflex, and mid right coronary contains moderate luminal irregularities.  4. Normal overall left ventricular function.   RECOMMENDATION: 1. Aspirin and Brilinta  2. Risk factor modification  3. Continue Angiomax times 60 minutes at a reduced rate.  4. May be a candidate for early discharge if no complications.   Hospital Course:  Macdonald Rigor is a 42 y.o. male with a history of DM 2, HTN, HL, obesity. He presented late 05/12/13 with left-sided chest pain radiating to his back and left arm. He arrived at the emergency room via EMS. Initial ECG demonstrated possible inferior ST elevation. He was transported emergently to the cardiac catheterization lab for cardiac catheterization with an eye towards percutaneous coronary intervention.  ECG was again reviewed and it was not clear that this was a STEMI. Cardiac catheterization was performed do to ongoing pain.  Cardiac catheterization demonstrated a totally occluded mid circumflex treated with a Promus premier DES. Dual antiplatelet therapy recommended for one year.  Troponin peaked at greater than 20. Post PCI course was uneventful. He was seen by Dr. Eden Emms this morning. He was pain-free and felt stable for discharge to home.  Discharge Vitals:   Blood pressure 118/78, pulse 69, temperature 97.7 F (36.5 C), temperature source Oral, resp.  rate 16, height 6' (1.829 m), weight 315 lb (142.883 kg), SpO2 94.00%.  Labs:   Recent Labs  05/12/13 2300 05/13/13 0545  WBC 14.0* 11.6*  HGB 13.7 12.9*  HCT 40.9 39.0  MCV 82.5 81.4  PLT 294 284     Recent Labs  05/12/13 2300 05/13/13 0545  NA 136 137  K 4.1 3.7  CL 97 100  CO2 27 23  BUN 11 9  CREATININE 0.80 0.69  CALCIUM 10.3 9.5    PROT 8.3  --   BILITOT 0.3  --   ALKPHOS 75  --   ALT 23  --   AST 18  --      Recent Labs  05/12/13 2337 05/13/13 0545  TROPONINI <0.30 >20.00*     Recent Labs  05/12/13 2300  INR 1.00    Diagnostic Procedures and Studies:  Dg Chest Portable 1 View  05/12/2013   IMPRESSION: Negative chest.   Original Report Authenticated By: Holley Dexter, M.D.    Disposition:   Pt is being discharged home today in good condition.  Follow-up Plans & Appointments      Follow-up Information   Follow up with Charlton Haws, MD In 2 weeks. (office will call to arrange an appointment)    Contact information:   [address] [place]lyn [place] [address] [phone]       Follow up with Neena Rhymes, MD In 2 weeks. (please call with an appointment)    Contact information:   [phone] [place] [address] [phone]       Discharge Medications    Medication List    STOP taking these medications       NORVASC 10 MG tablet  Generic drug:  amLODipine     valsartan 160 MG tablet  Commonly known as:  DIOVAN      TAKE these medications       allopurinol 100 MG tablet  Commonly known as:  ZYLOPRIM  Take 100 mg by mouth daily.     amoxicillin-clavulanate 875-125 MG per tablet  Commonly known as:  AUGMENTIN  Take 1 tablet by mouth 2 (two) times daily.     aspirin 81 MG chewable tablet  Chew 1 tablet (81 mg total) by mouth daily.     carvedilol 6.25 MG tablet  Commonly known as:  COREG  Take 1 tablet (6.25 mg total) by mouth 2 (two) times daily with a meal.     colchicine 0.6 MG tablet  Take 0.6 mg by mouth 2 (two) times daily. Then decrease to daily a symptoms allow     DEXILANT 60 MG capsule  Generic drug:  dexlansoprazole  Take 60 mg by mouth daily.     fenofibrate 160 MG tablet  Take 160 mg by mouth daily.     insulin detemir 100 UNIT/ML injection  Commonly known as:  LEVEMIR  Inject 60 Units into the skin  daily.     INVOKANA 100 MG Tabs  Generic drug:  Canagliflozin  Take 1 tablet by mouth daily.     ONETOUCH ULTRA BLUE VI  Test strips and lancets as directed dx: 250.00     rosuvastatin 10 MG tablet  Commonly known as:  CRESTOR  Take 10 mg by mouth daily.     sitaGLIPtin 100 MG tablet  Commonly known as:  JANUVIA  Take 1 tablet (100 mg total) by mouth daily.     Ticagrelor 90 MG Tabs tablet  Commonly known as:  BRILINTA  Take 1 tablet (90 mg total) by mouth 2 (two) times daily.         Outstanding Labs/Studies  1. None   Duration of Discharge Encounter: Greater than 30 minutes including physician and PA time.  Luna Glasgow, PA-C  11:04 AM 05/14/2013

## 2013-05-16 MED FILL — Sodium Chloride IV Soln 0.9%: INTRAVENOUS | Qty: 50 | Status: AC

## 2013-06-08 ENCOUNTER — Encounter: Payer: Federal, State, Local not specified - PPO | Admitting: Nurse Practitioner

## 2014-01-23 ENCOUNTER — Encounter (HOSPITAL_COMMUNITY): Payer: Self-pay | Admitting: Emergency Medicine

## 2014-01-23 ENCOUNTER — Emergency Department (HOSPITAL_COMMUNITY): Payer: Federal, State, Local not specified - PPO

## 2014-01-23 ENCOUNTER — Inpatient Hospital Stay (HOSPITAL_COMMUNITY)
Admission: EM | Admit: 2014-01-23 | Discharge: 2014-01-24 | DRG: 287 | Disposition: A | Discharge status: Home / Self Care | Payer: Federal, State, Local not specified - PPO | Attending: Cardiovascular Disease | Admitting: Cardiovascular Disease

## 2014-01-23 DIAGNOSIS — I2510 Atherosclerotic heart disease of native coronary artery without angina pectoris: Principal | ICD-10-CM | POA: Diagnosis present

## 2014-01-23 DIAGNOSIS — E785 Hyperlipidemia, unspecified: Secondary | ICD-10-CM | POA: Diagnosis present

## 2014-01-23 DIAGNOSIS — I252 Old myocardial infarction: Secondary | ICD-10-CM

## 2014-01-23 DIAGNOSIS — IMO0001 Reserved for inherently not codable concepts without codable children: Secondary | ICD-10-CM | POA: Diagnosis present

## 2014-01-23 DIAGNOSIS — Z79899 Other long term (current) drug therapy: Secondary | ICD-10-CM

## 2014-01-23 DIAGNOSIS — I10 Essential (primary) hypertension: Secondary | ICD-10-CM | POA: Diagnosis present

## 2014-01-23 DIAGNOSIS — R079 Chest pain, unspecified: Secondary | ICD-10-CM

## 2014-01-23 DIAGNOSIS — I200 Unstable angina: Secondary | ICD-10-CM | POA: Diagnosis present

## 2014-01-23 DIAGNOSIS — E1165 Type 2 diabetes mellitus with hyperglycemia: Secondary | ICD-10-CM

## 2014-01-23 DIAGNOSIS — Z9861 Coronary angioplasty status: Secondary | ICD-10-CM

## 2014-01-23 DIAGNOSIS — M109 Gout, unspecified: Secondary | ICD-10-CM | POA: Diagnosis present

## 2014-01-23 DIAGNOSIS — Z87891 Personal history of nicotine dependence: Secondary | ICD-10-CM

## 2014-01-23 DIAGNOSIS — Z6841 Body Mass Index (BMI) 40.0 and over, adult: Secondary | ICD-10-CM

## 2014-01-23 DIAGNOSIS — K219 Gastro-esophageal reflux disease without esophagitis: Secondary | ICD-10-CM | POA: Diagnosis present

## 2014-01-23 DIAGNOSIS — E669 Obesity, unspecified: Secondary | ICD-10-CM | POA: Diagnosis present

## 2014-01-23 LAB — BASIC METABOLIC PANEL
BUN: 9 mg/dL (ref 6–23)
CHLORIDE: 93 meq/L — AB (ref 96–112)
CO2: 25 meq/L (ref 19–32)
CREATININE: 0.74 mg/dL (ref 0.50–1.35)
Calcium: 9.6 mg/dL (ref 8.4–10.5)
GFR calc Af Amer: >90 mL/min (ref >90)
GFR calc non Af Amer: >90 mL/min (ref >90)
Glucose, Bld: 213 mg/dL — ABNORMAL HIGH (ref 70–99)
Potassium: 3.8 mEq/L (ref 3.7–5.3)
SODIUM: 133 meq/L — AB (ref 137–147)

## 2014-01-23 LAB — CBC
HCT: 38.2 % — ABNORMAL LOW (ref 39.0–52.0)
HEMOGLOBIN: 13.1 g/dL (ref 13.0–17.0)
MCH: 27.6 pg (ref 26.0–34.0)
MCHC: 34.3 g/dL (ref 30.0–36.0)
MCV: 80.6 fL (ref 78.0–100.0)
PLATELETS: 294 10*3/uL (ref 150–400)
RBC: 4.74 MIL/uL (ref 4.22–5.81)
RDW: 13.3 % (ref 11.5–15.5)
WBC: 8.7 10*3/uL (ref 4.0–10.5)

## 2014-01-23 LAB — I-STAT TROPONIN, ED: TROPONIN I, POC: 0 ng/mL (ref 0.00–0.08)

## 2014-01-23 LAB — CBG MONITORING, ED
GLUCOSE-CAPILLARY: 171 mg/dL — AB (ref 70–99)
Glucose-Capillary: 196 mg/dL — ABNORMAL HIGH (ref 70–99)

## 2014-01-23 MED ORDER — NITROGLYCERIN 0.4 MG SL SUBL
SUBLINGUAL_TABLET | SUBLINGUAL | Status: AC
  Administered 2014-01-23: 0.4 mg
  Filled 2014-01-23: qty 1

## 2014-01-23 MED ORDER — NITROGLYCERIN 2 % TD OINT
1.0000 [in_us] | TOPICAL_OINTMENT | Freq: Once | TRANSDERMAL | Status: AC
  Administered 2014-01-24: 1 [in_us] via TOPICAL
  Filled 2014-01-23: qty 1

## 2014-01-23 MED ORDER — NITROGLYCERIN 0.4 MG SL SUBL
0.4000 mg | SUBLINGUAL_TABLET | SUBLINGUAL | Status: DC | PRN
  Administered 2014-01-23: 0.4 mg via SUBLINGUAL

## 2014-01-23 MED ORDER — ASPIRIN 81 MG PO CHEW
324.0000 mg | CHEWABLE_TABLET | Freq: Once | ORAL | Status: AC
  Administered 2014-01-24: 324 mg via ORAL
  Filled 2014-01-23: qty 4

## 2014-01-23 NOTE — ED Notes (Signed)
Pt reporting today at 1530 devloped right sided cp and sob took nitro tab with no relief. Denies diaphoresis or vomiting but did feel nauseated and lightheaded. Reports he is diabetic and didn't eat right. Currently right sided cp 7/10, no sob. Skin is  Warm and dry. Pt had MI last year July 2014, with stent placement.

## 2014-01-23 NOTE — ED Notes (Signed)
Dr Yelverton at bedside.  

## 2014-01-24 ENCOUNTER — Encounter (HOSPITAL_COMMUNITY): Payer: Self-pay | Admitting: Cardiology

## 2014-01-24 ENCOUNTER — Encounter (HOSPITAL_COMMUNITY)
Admission: EM | Disposition: A | Discharge status: Home / Self Care | Payer: Self-pay | Source: Home / Self Care | Attending: Cardiovascular Disease

## 2014-01-24 DIAGNOSIS — I2510 Atherosclerotic heart disease of native coronary artery without angina pectoris: Secondary | ICD-10-CM

## 2014-01-24 DIAGNOSIS — R079 Chest pain, unspecified: Secondary | ICD-10-CM

## 2014-01-24 DIAGNOSIS — I200 Unstable angina: Secondary | ICD-10-CM

## 2014-01-24 DIAGNOSIS — I10 Essential (primary) hypertension: Secondary | ICD-10-CM

## 2014-01-24 DIAGNOSIS — Z9861 Coronary angioplasty status: Secondary | ICD-10-CM

## 2014-01-24 HISTORY — PX: LEFT HEART CATHETERIZATION WITH CORONARY ANGIOGRAM: SHX5451

## 2014-01-24 LAB — LIPID PANEL
Cholesterol: 202 mg/dL — ABNORMAL HIGH (ref 0–200)
HDL: 28 mg/dL — AB (ref >39)
LDL CALC: 143 mg/dL — AB (ref 0–99)
Total CHOL/HDL Ratio: 7.2 RATIO
Triglycerides: 157 mg/dL — ABNORMAL HIGH (ref <150)
VLDL: 31 mg/dL (ref 0–40)

## 2014-01-24 LAB — GLUCOSE, CAPILLARY
GLUCOSE-CAPILLARY: 216 mg/dL — AB (ref 70–99)
GLUCOSE-CAPILLARY: 245 mg/dL — AB (ref 70–99)
GLUCOSE-CAPILLARY: 164 mg/dL — AB (ref 70–99)

## 2014-01-24 LAB — HEMOGLOBIN A1C
HEMOGLOBIN A1C: 9.9 % — AB (ref <5.7)
Mean Plasma Glucose: 237 mg/dL — ABNORMAL HIGH (ref <117)

## 2014-01-24 LAB — TROPONIN I
Troponin I: <0.3 ng/mL (ref <0.30)
Troponin I: <0.3 ng/mL (ref <0.30)

## 2014-01-24 LAB — CBC
HCT: 37.5 % — ABNORMAL LOW (ref 39.0–52.0)
HEMATOCRIT: 38 % — AB (ref 39.0–52.0)
Hemoglobin: 12.7 g/dL — ABNORMAL LOW (ref 13.0–17.0)
Hemoglobin: 12.7 g/dL — ABNORMAL LOW (ref 13.0–17.0)
MCH: 27.2 pg (ref 26.0–34.0)
MCH: 27.5 pg (ref 26.0–34.0)
MCHC: 33.4 g/dL (ref 30.0–36.0)
MCHC: 33.9 g/dL (ref 30.0–36.0)
MCV: 81.4 fL (ref 78.0–100.0)
MCV: 81.3 fL (ref 78.0–100.0)
PLATELETS: 260 10*3/uL (ref 150–400)
Platelets: 291 10*3/uL (ref 150–400)
RBC: 4.67 MIL/uL (ref 4.22–5.81)
RBC: 4.61 MIL/uL (ref 4.22–5.81)
RDW: 13.6 % (ref 11.5–15.5)
RDW: 13.6 % (ref 11.5–15.5)
WBC: 9 10*3/uL (ref 4.0–10.5)
WBC: 7.7 10*3/uL (ref 4.0–10.5)

## 2014-01-24 LAB — BASIC METABOLIC PANEL
BUN: 9 mg/dL (ref 6–23)
CO2: 22 meq/L (ref 19–32)
Calcium: 9.6 mg/dL (ref 8.4–10.5)
Chloride: 100 mEq/L (ref 96–112)
Creatinine, Ser: 0.68 mg/dL (ref 0.50–1.35)
GFR calc non Af Amer: >90 mL/min (ref >90)
Glucose, Bld: 203 mg/dL — ABNORMAL HIGH (ref 70–99)
POTASSIUM: 4 meq/L (ref 3.7–5.3)
Sodium: 139 mEq/L (ref 137–147)

## 2014-01-24 LAB — PLATELET INHIBITION P2Y12: Platelet Function  P2Y12: 281 [PRU] (ref 194–418)

## 2014-01-24 LAB — PROTIME-INR
INR: 1.2 (ref 0.00–1.49)
Prothrombin Time: 14.9 seconds (ref 11.6–15.2)

## 2014-01-24 LAB — HEPARIN LEVEL (UNFRACTIONATED): HEPARIN UNFRACTIONATED: 0.25 [IU]/mL — AB (ref 0.30–0.70)

## 2014-01-24 SURGERY — LEFT HEART CATHETERIZATION WITH CORONARY ANGIOGRAM
Anesthesia: LOCAL

## 2014-01-24 MED ORDER — ALLOPURINOL 100 MG PO TABS
100.0000 mg | ORAL_TABLET | Freq: Every day | ORAL | Status: DC
  Administered 2014-01-24: 100 mg via ORAL
  Filled 2014-01-24: qty 1

## 2014-01-24 MED ORDER — VERAPAMIL HCL 2.5 MG/ML IV SOLN
INTRAVENOUS | Status: AC
  Filled 2014-01-24: qty 2

## 2014-01-24 MED ORDER — HEPARIN (PORCINE) IN NACL 100-0.45 UNIT/ML-% IJ SOLN
1800.0000 [IU]/h | INTRAMUSCULAR | Status: DC
  Administered 2014-01-24: 1600 [IU]/h via INTRAVENOUS
  Administered 2014-01-24: 1800 [IU]/h via INTRAVENOUS
  Filled 2014-01-24 (×2): qty 250

## 2014-01-24 MED ORDER — HEPARIN BOLUS VIA INFUSION
4000.0000 [IU] | Freq: Once | INTRAVENOUS | Status: AC
  Administered 2014-01-24: 4000 [IU] via INTRAVENOUS
  Filled 2014-01-24: qty 4000

## 2014-01-24 MED ORDER — ATORVASTATIN CALCIUM 40 MG PO TABS
40.0000 mg | ORAL_TABLET | Freq: Every day | ORAL | Status: DC
Start: 2014-01-24 — End: 2014-01-25
  Administered 2014-01-24: 40 mg via ORAL
  Filled 2014-01-24: qty 1

## 2014-01-24 MED ORDER — SODIUM CHLORIDE 0.9 % IJ SOLN: 3.0000 mL | Freq: Two times a day (BID) | INTRAMUSCULAR | Status: DC

## 2014-01-24 MED ORDER — HEPARIN SODIUM (PORCINE) 1000 UNIT/ML IJ SOLN
INTRAMUSCULAR | Status: AC
  Filled 2014-01-24: qty 1

## 2014-01-24 MED ORDER — INSULIN ASPART 100 UNIT/ML ~~LOC~~ SOLN
0.0000 [IU] | Freq: Every day | SUBCUTANEOUS | Status: DC
Start: 2014-01-24 — End: 2014-01-25

## 2014-01-24 MED ORDER — NITROGLYCERIN 0.4 MG SL SUBL: 0.4000 mg | SUBLINGUAL_TABLET | SUBLINGUAL | Status: DC | PRN

## 2014-01-24 MED ORDER — HEPARIN (PORCINE) IN NACL 2-0.9 UNIT/ML-% IJ SOLN
INTRAMUSCULAR | Status: AC
  Filled 2014-01-24: qty 1000

## 2014-01-24 MED ORDER — COLCHICINE 0.6 MG PO TABS
0.6000 mg | ORAL_TABLET | Freq: Two times a day (BID) | ORAL | Status: DC
  Filled 2014-01-24 (×2): qty 1

## 2014-01-24 MED ORDER — PANTOPRAZOLE SODIUM 40 MG PO TBEC
40.0000 mg | DELAYED_RELEASE_TABLET | Freq: Every day | ORAL | Status: DC
  Administered 2014-01-24: 40 mg via ORAL
  Filled 2014-01-24: qty 1

## 2014-01-24 MED ORDER — MIDAZOLAM HCL 2 MG/2ML IJ SOLN
INTRAMUSCULAR | Status: AC
  Filled 2014-01-24: qty 2

## 2014-01-24 MED ORDER — SODIUM CHLORIDE 0.9 % IV SOLN
INTRAVENOUS | Status: AC
  Administered 2014-01-24: 04:00:00 via INTRAVENOUS

## 2014-01-24 MED ORDER — NEBIVOLOL HCL 5 MG PO TABS
5.0000 mg | ORAL_TABLET | Freq: Every day | ORAL | Status: DC
  Administered 2014-01-24: 5 mg via ORAL
  Filled 2014-01-24: qty 1

## 2014-01-24 MED ORDER — PRASUGREL HCL 10 MG PO TABS
10.0000 mg | ORAL_TABLET | Freq: Every day | ORAL | Status: DC
  Administered 2014-01-24: 10 mg via ORAL
  Filled 2014-01-24: qty 1

## 2014-01-24 MED ORDER — FENTANYL CITRATE 0.05 MG/ML IJ SOLN
INTRAMUSCULAR | Status: AC
  Filled 2014-01-24: qty 2

## 2014-01-24 MED ORDER — LIDOCAINE HCL (PF) 1 % IJ SOLN
INTRAMUSCULAR | Status: AC
  Filled 2014-01-24: qty 30

## 2014-01-24 MED ORDER — ASPIRIN EC 81 MG PO TBEC
81.0000 mg | DELAYED_RELEASE_TABLET | Freq: Every day | ORAL | Status: DC
Start: 2014-01-25 — End: 2014-01-25

## 2014-01-24 MED ORDER — SODIUM CHLORIDE 0.9 % IJ SOLN: 3.0000 mL | INTRAMUSCULAR | Status: DC | PRN

## 2014-01-24 MED ORDER — PRASUGREL HCL 10 MG PO TABS: 10.0000 mg | ORAL_TABLET | Freq: Every day | ORAL | Status: DC

## 2014-01-24 MED ORDER — INSULIN ASPART 100 UNIT/ML ~~LOC~~ SOLN
0.0000 [IU] | Freq: Three times a day (TID) | SUBCUTANEOUS | Status: DC
Start: 2014-01-24 — End: 2014-01-25
  Administered 2014-01-24: 3 [IU] via SUBCUTANEOUS
  Administered 2014-01-24: 8 [IU] via SUBCUTANEOUS

## 2014-01-24 MED ORDER — SODIUM CHLORIDE 0.9 % IV SOLN
INTRAVENOUS | Status: DC
  Administered 2014-01-24: 12:00:00 via INTRAVENOUS

## 2014-01-24 MED ORDER — SODIUM CHLORIDE 0.9 % IV SOLN: 1.0000 mL/kg/h | INTRAVENOUS | Status: AC

## 2014-01-24 MED ORDER — NITROGLYCERIN 0.2 MG/ML ON CALL CATH LAB
INTRAVENOUS | Status: AC
  Filled 2014-01-24: qty 1

## 2014-01-24 MED ORDER — CLOPIDOGREL BISULFATE 75 MG PO TABS
75.0000 mg | ORAL_TABLET | Freq: Every day | ORAL | Status: DC
  Administered 2014-01-24: 75 mg via ORAL
  Filled 2014-01-24: qty 1

## 2014-01-24 MED ORDER — SODIUM CHLORIDE 0.9 % IV SOLN: 250.0000 mL | INTRAVENOUS | Status: DC | PRN

## 2014-01-24 MED ORDER — ASPIRIN 81 MG PO CHEW
81.0000 mg | CHEWABLE_TABLET | ORAL | Status: AC
  Administered 2014-01-24: 81 mg via ORAL
  Filled 2014-01-24: qty 1

## 2014-01-24 NOTE — Interval H&P Note (Signed)
History and Physical Interval Note:  01/24/2014 4:05 PM  Herbert Setanthony Pischke  has presented today for surgery, with the diagnosis of cp  The various methods of treatment have been discussed with the patient and family. After consideration of risks, benefits and other options for treatment, the patient has consented to  Procedure(s): LEFT HEART CATHETERIZATION WITH CORONARY ANGIOGRAM (N/A) as a surgical intervention .  The patient's history has been reviewed, patient examined, no change in status, stable for surgery.  I have reviewed the patient's chart and labs.  Questions were answered to the patient's satisfaction.   Cath Lab Visit (complete for each Cath Lab visit)  Clinical Evaluation Leading to the Procedure:   ACS: yes  Non-ACS:    Anginal Classification: CCS III  Anti-ischemic medical therapy: Minimal Therapy (1 class of medications)  Non-Invasive Test Results: No non-invasive testing performed  Prior CABG: No previous CABG        [name] [facility] HospitalJordanMD,FACC 01/24/2014 4:05 PM

## 2014-01-24 NOTE — H&P (View-Only) (Signed)
Patient Profile: 43 y/o obese AAM with PMH significant for CAD, s/p DES to circumflex in July 2014, HTN, HLD and DM, who has been admitted for evaluation of chest pain concerning for unstable angina. Pain described as right sided chest heaviness, radiating to the substernal area. Slight worse with exertion and improved after SL NTG x 3. Also with associated SOB. No recurrence in pain overnight. EKG s/o ischemic changes. Troponins negative x 2. 3rd set pending.    Subjective: Currently CP free. No recurrence in pain since last PM. Denies SOB currently.   Objective: Vital signs in last 24 hours: Temp:  [97.9 F (36.6 C)-98.7 F (37.1 C)] 97.9 F (36.6 C) (03/17 0546) Pulse Rate:  [56-99] 56 (03/17 0546) Resp:  [14-20] 18 (03/17 0546) BP: (101-141)/(57-82) 101/57 mmHg (03/17 0546) SpO2:  [96 %-100 %] 99 % (03/17 0546) Weight:  [300 lb (136.079 kg)-329 lb 14.4 oz (149.642 kg)] 329 lb 14.4 oz (149.642 kg) (03/17 0204) Last BM Date: 01/23/14  Intake/Output from previous day: 03/16 0701 - 03/17 0700 In: -  Out: 550 [Urine:550] Intake/Output this shift:    Medications Current Facility-Administered Medications  Medication Dose Route Frequency Provider Last Rate Last Dose  . 0.9 %  sodium chloride infusion   Intravenous Continuous Brittainy Simmons, PA-C      . allopurinol (ZYLOPRIM) tablet 100 mg  100 mg Oral Daily Pelbreton Balfour, MD   100 mg at 01/24/14 1004  . [START ON 01/25/2014] aspirin EC tablet 81 mg  81 mg Oral Daily Pelbreton Balfour, MD      . atorvastatin (LIPITOR) tablet 40 mg  40 mg Oral q1800 Pelbreton Balfour, MD      . clopidogrel (PLAVIX) tablet 75 mg  75 mg Oral Q breakfast Pelbreton Balfour, MD   75 mg at 01/24/14 1004  . colchicine tablet 0.6 mg  0.6 mg Oral BID Pelbreton Balfour, MD      . heparin ADULT infusion 100 units/mL (25000 units/250 mL)  1,600 Units/hr Intravenous Continuous Veronda Pauline Bryk, RPH 16 mL/hr at 01/24/14 0350 1,600 Units/hr at 01/24/14  0350  . insulin aspart (novoLOG) injection 0-15 Units  0-15 Units Subcutaneous TID WC Pelbreton Balfour, MD      . insulin aspart (novoLOG) injection 0-5 Units  0-5 Units Subcutaneous QHS Pelbreton Balfour, MD      . nebivolol (BYSTOLIC) tablet 5 mg  5 mg Oral Daily Pelbreton Balfour, MD   5 mg at 01/24/14 1004  . nitroGLYCERIN (NITROSTAT) SL tablet 0.4 mg  0.4 mg Sublingual Q5 min PRN David Yelverton, MD   0.4 mg at 01/23/14 2019  . nitroGLYCERIN (NITROSTAT) SL tablet 0.4 mg  0.4 mg Sublingual Q5 Min x 3 PRN Pelbreton Balfour, MD      . pantoprazole (PROTONIX) EC tablet 40 mg  40 mg Oral Daily Pelbreton Balfour, MD   40 mg at 01/24/14 1038    PE: General appearance: alert, cooperative, no distress and moderately obese Lungs: clear to auscultation bilaterally Heart: regular rate and rhythm, S1, S2 normal, no murmur, click, rub or gallop Extremities: no LEE Pulses: 2+ and symmetric Skin: warm and dry Neurologic: Grossly normal  Lab Results:   Recent Labs  01/23/14 1827 01/24/14 0456 01/24/14 0920  WBC 8.7 9.0 7.7  HGB 13.1 12.7* 12.7*  HCT 38.2* 38.0* 37.5*  PLT 294 291 260   BMET  Recent Labs  01/23/14 1827 01/24/14 0456  NA 133* 139  K 3.8 4.0  CL 93* 100  CO2 25   22  GLUCOSE 213* 203*  BUN 9 9  CREATININE 0.74 0.68  CALCIUM 9.6 9.6   PT/INR  Recent Labs  01/24/14 0456  LABPROT 14.9  INR 1.20   Cholesterol  Recent Labs  01/24/14 0456  CHOL 202*   Cardiac Enzymes Cardiac Panel (last 3 results)  Recent Labs  01/23/14 2352 01/24/14 0456 01/24/14 0920  TROPONINI <0.30 <0.30 <0.30   Studies:  LHC July 2014   ANGIOGRAPHIC DATA: The left main coronary artery is widely patent .  The left anterior descending artery is widely patent and transapical. Several regions of luminal irregularities are noted in the proximal and mid vessel. A large diagonal arises from the mid LAD. No significant obstruction is noted. .  The left circumflex artery is is large  and gives origin to a large first and second obtuse marginal branches. Each vessel has moderate luminal irregularity within the mid segments. After the second obtuse marginal the circumflex is totally occluded. .  The right coronary artery is codominant containing mid 40% stenosis.  LEFT VENTRICULOGRAM: Left ventricular angiogram was done in the 30 RAO projection and revealed normal left ventricular wall motion and systolic function with an estimated ejection fraction of 65%. LVEDP was 18 mmHg.    Assessment/Plan    Principal Problem:   Unstable angina Active Problems:   CAD S/P percutaneous coronary angioplasty - DES to LCx yesterday.   HYPERLIPIDEMIA   HYPERTENSION   DM   OBESITY  Plan: 43 y/o male with known CAD, s/p DES to circumflex in July 2014, and multiple cardiac risk factors including obesity, HTN, HLD and DM, admitted for pain concerning for unstable angina. EKG w/o ischemic changes. Cardiac enzymes negative x 3.   1. Chest Pain/ Coronary Artery Disease:  Symptoms are a bit concerning for unstable angina. With multiple risk factors as listed above, there is concern for progression of disease. In addition to his total circumflex occlusion, LHC in 2014 demonstrated 40% stenosis of the mid RCA. He has been NPO since midnight. Will plan for LHC later today. For now, continue IV heparin, ASA + Plavix, BB and statin. ? Obtaining a P2Y12 to assess level of platelet inhibition.   2. Hypertension: Controlled.   3. Hyperlipidemia: Poorly controlled. LDL is elevated at 143. Goal is <70. HDL also low at 28. Triglycerides elevated at 157. Continue Lipitor. Will stress importance of lifestyle modification (weight loss, diet and exercise). ? increasing his Lipitor to 80 or adding an additional agent.   4. Diabetes: Hgb A1 c pending. Daily BG levels uncontrolled. Continue SS insulin + oral hypoglycemics.   Dispo: home after ischemic w/u is completed and patient is stable from a cardiac  standpoint.   MD to follow with further recommendations.    LOS: 1 day    Brittainy M. Simmons, PA-C 01/24/2014 11:18 AM  I have seen and evaluated the patient this AM along with Brittany Simmons, PA. I agree with her findings, examination as well as impression recommendations.  Young man with NSTEMI last July - just now in the prime time for ISR p/w resting CP that has some similarities to his MI angina - starting on R& radiating to center of chest.  Not as severe this time & no Jaw pain with radiation to arms.  ++ SOB - made worse while walking in to ER. -- Sx steadily improved with NTG/ASA & IV Heparin.  He is anxious about his Sx, has been adherent to medication regimen.  Has not had   antecedent CP.  BP & HR are stable & on good Med Regimen.   At this point argument could be made for either invasive vs. Non-invasive approach (which would be a 2 day study).  Based upon concern for possible ISR - will check P2Y12 assay for Plavix responsiveness.  After discussion with pt. & wife (reviewing R/B/A & I of both options) we concluded that there is enough concern for ISR/thrombosis that we feel an invasive approach is warranted as Resting CP (if cardiac) would be considered Unstable Angina.  Plan - LHC/Angio +/- PCI today.  If no new disease or ISR - consider vasospasm as etiology & add Nitrate.  HARDING,DAVID W, M.D., M.S. Interventional Cardiologist  [facility] Pager # [phone] 01/24/2014      

## 2014-01-24 NOTE — ED Notes (Signed)
Report attempted x 1

## 2014-01-24 NOTE — ED Notes (Signed)
Cardiologist at bedside.  

## 2014-01-24 NOTE — Progress Notes (Addendum)
Patient Profile: 43 y/o obese AAM with PMH significant for CAD, s/p DES to circumflex in July 2014, HTN, HLD and DM, who has been admitted for evaluation of chest pain concerning for unstable angina. Pain described as right sided chest heaviness, radiating to the substernal area. Slight worse with exertion and improved after SL NTG x 3. Also with associated SOB. No recurrence in pain overnight. EKG s/o ischemic changes. Troponins negative x 2. 3rd set pending.    Subjective: Currently CP free. No recurrence in pain since last PM. Denies SOB currently.   Objective: Vital signs in last 24 hours: Temp:  [97.9 F (36.6 C)-98.7 F (37.1 C)] 97.9 F (36.6 C) (03/17 0546) Pulse Rate:  [56-99] 56 (03/17 0546) Resp:  [14-20] 18 (03/17 0546) BP: (101-141)/(57-82) 101/57 mmHg (03/17 0546) SpO2:  [96 %-100 %] 99 % (03/17 0546) Weight:  [300 lb (136.079 kg)-329 lb 14.4 oz (149.642 kg)] 329 lb 14.4 oz (149.642 kg) (03/17 0204) Last BM Date: 01/23/14  Intake/Output from previous day: 03/16 0701 - 03/17 0700 In: -  Out: 550 [Urine:550] Intake/Output this shift:    Medications Current Facility-Administered Medications  Medication Dose Route Frequency Provider Last Rate Last Dose  . 0.9 %  sodium chloride infusion   Intravenous Continuous Brittainy Simmons, PA-C      . allopurinol (ZYLOPRIM) tablet 100 mg  100 mg Oral Daily Lenon Oms, MD   100 mg at 01/24/14 1004  . [START ON 01/25/2014] aspirin EC tablet 81 mg  81 mg Oral Daily Lenon Oms, MD      . atorvastatin (LIPITOR) tablet 40 mg  40 mg Oral q1800 Lenon Oms, MD      . clopidogrel (PLAVIX) tablet 75 mg  75 mg Oral Q breakfast Lenon Oms, MD   75 mg at 01/24/14 1004  . colchicine tablet 0.6 mg  0.6 mg Oral BID Lenon Oms, MD      . heparin ADULT infusion 100 units/mL (25000 units/250 mL)  1,600 Units/hr Intravenous Continuous Colleen Can, RPH 16 mL/hr at 01/24/14 0350 1,600 Units/hr at 01/24/14  0350  . insulin aspart (novoLOG) injection 0-15 Units  0-15 Units Subcutaneous TID WC Pelbreton Balfour, MD      . insulin aspart (novoLOG) injection 0-5 Units  0-5 Units Subcutaneous QHS Pelbreton Balfour, MD      . nebivolol (BYSTOLIC) tablet 5 mg  5 mg Oral Daily Lenon Oms, MD   5 mg at 01/24/14 1004  . nitroGLYCERIN (NITROSTAT) SL tablet 0.4 mg  0.4 mg Sublingual Q5 min PRN Loren Racer, MD   0.4 mg at 01/23/14 2019  . nitroGLYCERIN (NITROSTAT) SL tablet 0.4 mg  0.4 mg Sublingual Q5 Min x 3 PRN Lenon Oms, MD      . pantoprazole (PROTONIX) EC tablet 40 mg  40 mg Oral Daily Lenon Oms, MD   40 mg at 01/24/14 1038    PE: General appearance: alert, cooperative, no distress and moderately obese Lungs: clear to auscultation bilaterally Heart: regular rate and rhythm, S1, S2 normal, no murmur, click, rub or gallop Extremities: no LEE Pulses: 2+ and symmetric Skin: warm and dry Neurologic: Grossly normal  Lab Results:   Recent Labs  01/23/14 1827 01/24/14 0456 01/24/14 0920  WBC 8.7 9.0 7.7  HGB 13.1 12.7* 12.7*  HCT 38.2* 38.0* 37.5*  PLT 294 291 260   BMET  Recent Labs  01/23/14 1827 01/24/14 0456  NA 133* 139  K 3.8 4.0  CL 93* 100  CO2 25  22  GLUCOSE 213* 203*  BUN 9 9  CREATININE 0.74 0.68  CALCIUM 9.6 9.6   PT/INR  Recent Labs  01/24/14 0456  LABPROT 14.9  INR 1.20   Cholesterol  Recent Labs  01/24/14 0456  CHOL 202*   Cardiac Enzymes Cardiac Panel (last 3 results)  Recent Labs  01/23/14 2352 01/24/14 0456 01/24/14 0920  TROPONINI <0.30 <0.30 <0.30   Studies:  [facility] July 2014   ANGIOGRAPHIC DATA: The left main coronary artery is widely patent .  The left anterior descending artery is widely patent and transapical. Several regions of luminal irregularities are noted in the proximal and mid vessel. A large diagonal arises from the mid LAD. No significant obstruction is noted. .  The left circumflex artery is is large  and gives origin to a large first and second obtuse marginal branches. Each vessel has moderate luminal irregularity within the mid segments. After the second obtuse marginal the circumflex is totally occluded. .  The right coronary artery is codominant containing mid 40% stenosis.  LEFT VENTRICULOGRAM: Left ventricular angiogram was done in the 30 RAO projection and revealed normal left ventricular wall motion and systolic function with an estimated ejection fraction of 65%. LVEDP was 18 mmHg.    Assessment/Plan    Principal Problem:   Unstable angina Active Problems:   CAD S/P percutaneous coronary angioplasty - DES to LCx yesterday.   HYPERLIPIDEMIA   HYPERTENSION   DM   OBESITY  Plan: 43 y/o male with known CAD, s/p DES to circumflex in July 2014, and multiple cardiac risk factors including obesity, HTN, HLD and DM, admitted for pain concerning for unstable angina. EKG w/o ischemic changes. Cardiac enzymes negative x 3.   1. Chest Pain/ Coronary Artery Disease:  Symptoms are a bit concerning for unstable angina. With multiple risk factors as listed above, there is concern for progression of disease. In addition to his total circumflex occlusion, LHC in 2014 demonstrated 40% stenosis of the mid RCA. He has been NPO since midnight. Will plan for LHC later today. For now, continue IV heparin, ASA + Plavix, BB and statin. ? Obtaining a P2Y12 to assess level of platelet inhibition.   2. Hypertension: Controlled.   3. Hyperlipidemia: Poorly controlled. LDL is elevated at 143. Goal is <70. HDL also low at 28. Triglycerides elevated at 157. Continue Lipitor. Will stress importance of lifestyle modification (weight loss, diet and exercise). ? increasing his Lipitor to 80 or adding an additional agent.   4. Diabetes: Hgb A1 c pending. Daily BG levels uncontrolled. Continue SS insulin + oral hypoglycemics.   Dispo: home after ischemic w/u is completed and patient is stable from a cardiac  standpoint.   MD to follow with further recommendations.    LOS: 1 day    Brittainy M. Sharol Harness, PA-C 01/24/2014 11:18 AM  I have seen and evaluated the patient this AM along with Boyce Medici, PA. I agree with her findings, examination as well as impression recommendations.  Young man with NSTEMI last July - just now in the prime time for ISR p/w resting CP that has some similarities to his MI angina - starting on R& radiating to center of chest.  Not as severe this time & no Jaw pain with radiation to arms.  ++ SOB - made worse while walking in to ER. -- Sx steadily improved with NTG/ASA & IV Heparin.  He is anxious about his Sx, has been adherent to medication regimen.  Has not had  antecedent CP.  BP & HR are stable & on good Med Regimen.   At this point argument could be made for either invasive vs. Non-invasive approach (which would be a 2 day study).  Based upon concern for possible ISR - will check P2Y12 assay for Plavix responsiveness.  After discussion with pt. & wife (reviewing R/B/A & I of both options) we concluded that there is enough concern for ISR/thrombosis that we feel an invasive approach is warranted as Resting CP (if cardiac) would be considered Unstable Angina.  Plan - LHC/Angio +/- PCI today.  If no new disease or ISR - consider vasospasm as etiology & add Nitrate.  Marykay LexHARDING,DAVID W, M.D., M.S. Interventional Cardiologist  [facility] Pager # [phone] 01/24/2014

## 2014-01-24 NOTE — Discharge Summary (Addendum)
Physician Discharge Summary     Patient ID: Aaron Franco MRN: [id number] DOB/AGE: Dec 31, 1970 43 y.o.  Admit date: 01/23/2014 Discharge date: 01/24/2014  Admission Diagnoses:  Unstable angina  Discharge Diagnoses:  Principal Problem:   Unstable angina Active Problems:   DM   HYPERLIPIDEMIA   OBESITY   HYPERTENSION   CAD S/P percutaneous coronary angioplasty - DES to LCx yesterday.   Discharged Condition: stable  Hospital Course:  Aaron Setanthony Humphreys is a 43 y.o. male who presents for evaluation of chest pain. 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 He was treated with DES to circumflex and discharged on ASA/Plavix. He has been doing well since then with no repeat admissions and/or use of NTG. Today he felt overal generalized malaise after rushing to run errands during the day prior to work getting to work at [employer]3pm. He then developed chest pain that started on his right and radiated to center of chest around 5pm. Pain was around 8/10. He did also have shortness of breath. He took one NTG with no relief. He then left work as symptoms did not improve and came to the ED. He received ASA and 2 more NTG and pain is now down to 1/10. EKG shows no acute changes and POC trop is negative.   Cath 05/2013:  1. Non ST elevation MI due to total occlusion of the mid to distal circumflex supplying a small distribution.  2. Successful drug-eluting stent implantation in the mid to distal circumflex reducing the stenosis from 100% to 0% with TIMI grade 3 flow  3. Widely patent LAD, proximal circumflex, and right coronary. The LAD, circumflex, and mid right coronary contains moderate luminal irregularities.  4. Normal overall left ventricular function.  The patient ruled out for MI.  Left heart cath revealed nonobstructive CAD, normal LVEF.  DM is uncontrolled with A1C of 9.9.  He was not responding to plavix so it was changed  to Effient. The patient was seen by Dr. SwazilandJordan who felt he was stable for DC home.  [facility]    Consults: None  Significant Diagnostic Studies:  Left heart cath.   Procedural Findings:  Hemodynamics:  AO 126/72 mean 96 mm Hg  LV 125/19 mm Hg  Coronary angiography:  Coronary dominance: right  Left mainstem: Normal  Left anterior descending (LAD): Diffuse mild irregularities. 20-30% distal LAD.  Left circumflex (LCx): Large codominant vessel. The first and second OM are large vessels and are normal. The distal LCx is widely patent at the stent site.  Right coronary artery (RCA): The RCA has diffuse disease up to 30-40% in the mid vessel.  Left ventriculography: Left ventricular systolic function is normal, LVEF is estimated at 55-65%, there is no significant mitral regurgitation  Final Conclusions:  1. Nonobstructive CAD  2. Normal LV function.  Recommendations: Recommend continued medical therapy. P2Y12 assay indicates he is a nonresponder to Plavix. History of intolerance to Brilinta. Will switch to Effient for duration of therapy.  Peter Coleman County Medical CenterJordanMD,FACC  Treatments:  See Above.  Discharge Exam: Blood pressure 106/41, pulse 68, temperature 98.4 F (36.9 C), temperature source Oral, resp. rate 16, height 6' (1.829 m), weight 329 lb 14.4 oz (149.642 kg), SpO2 99.00%.   Disposition: 01-Home or Self Care  Discharge Orders   Future Orders Complete By Expires   Diet - low sodium heart healthy  As directed    Discharge instructions  As directed  Comments:     No lifting with right arm for three days.   Increase activity slowly  As directed        Medication List    STOP taking these medications       clopidogrel 75 MG tablet  Commonly known as:  PLAVIX      TAKE these medications       allopurinol 100 MG tablet  Commonly known as:  ZYLOPRIM  Take 100 mg by mouth daily.     aspirin 81 MG chewable tablet  Chew 1 tablet (81 mg total) by  mouth daily.     colchicine 0.6 MG tablet  Take 0.6 mg by mouth 2 (two) times daily. Then decrease to daily a symptoms allow     DEXILANT 60 MG capsule  Generic drug:  dexlansoprazole  Take 60 mg by mouth daily.     insulin detemir 100 UNIT/ML injection  Commonly known as:  LEVEMIR  Inject 25-35 Units into the skin daily. 25 units every morning and 35 units at bedtime     nebivolol 5 MG tablet  Commonly known as:  BYSTOLIC  Take 5 mg by mouth daily.     prasugrel 10 MG Tabs tablet  Commonly known as:  EFFIENT  Take 1 tablet (10 mg total) by mouth daily.     rosuvastatin 10 MG tablet  Commonly known as:  CRESTOR  Take 40 mg by mouth daily.     sitaGLIPtin 100 MG tablet  Commonly known as:  JANUVIA  Take 1 tablet (100 mg total) by mouth daily.      ASK your doctor about these medications       nitroGLYCERIN 0.4 MG SL tablet  Commonly known as:  NITROSTAT  Place 1 tablet (0.4 mg total) under the tongue every 5 (five) minutes as needed for chest pain.           Follow-up Information   Follow up with [facility]. (Call for follow up appt.)       Signed: HAGER, BRYAN 01/24/2014, 5:18 PM  Pt seen & examined this AM --> ? Unstable angina -- > cath today without obstructive CAD.   P2y12 assay shows Plavix non-responder -- covnert to Effient.   OK to d/c post cath bed rest.  F/u with primary fcardiologist.  ? MSK related pain.  Marykay Lex, MD

## 2014-01-24 NOTE — ED Provider Notes (Signed)
CSN: [id number]     Arrival date & time 01/23/14  1734 History   First MD Initiated Contact with Patient 01/23/14 2340     Chief Complaint  Patient presents with  . Chest Pain     (Consider location/radiation/quality/duration/timing/severity/associated sxs/prior Treatment) HPI Patient has a history of diabetes, hypertension and previous MI. Had stent placed by Dr. Katrinka Blazing last year. States he's been compliant with his medications. Yesterday around 3 PM he began having right-sided and central chest tightness. There is no radiation. He did have small shortness of breath. He denies any diaphoresis or vomiting. States he took 1 nitroglycerin and he was given 2 nitroglycerin here in the emergency department. His discomfort has almost completely resolved. Patient denies any lower extremity swelling or pain. Past Medical History  Diagnosis Date  . Diabetes mellitus   . GERD (gastroesophageal reflux disease)   . Gout   . Hyperlipidemia   . Hypertension   . Obesity   . CAD (coronary artery disease)     a. NSTEMI 7/14:  LHC with mCFX 100% => Promus premier DES; EF 65%   Past Surgical History  Procedure Laterality Date  . Biceps tendon repair  Nov 13 2010    right  . Hand surgery  1990    left   Family History  Problem Relation Age of Onset  . Heart attack Father     deceased  . Stroke Mother   . Diabetes Father   . Pancreatic cancer Maternal Aunt   . Colon cancer Neg Hx   . Heart failure Father    History  Substance Use Topics  . Smoking status: Former Smoker    Types: Cigarettes    Quit date: 11/10/1998  . Smokeless tobacco: Never Used     Comment: pt only smoked 1-2 cigs a day, not everyday on occs x 2 years  . Alcohol Use: Yes     Comment: rare    Review of Systems  Constitutional: Negative for fever and chills.  Respiratory: Positive for shortness of breath. Negative for cough.   Cardiovascular: Negative for chest pain, palpitations and leg swelling.  Gastrointestinal:  Negative for nausea, vomiting and abdominal pain.  Musculoskeletal: Negative for back pain, neck pain and neck stiffness.  Skin: Negative for rash and wound.  Neurological: Positive for light-headedness. Negative for dizziness, syncope, weakness, numbness and headaches.  All other systems reviewed and are negative.      Allergies  Prednisone  Home Medications   Current Outpatient Rx  Name  Route  Sig  Dispense  Refill  . allopurinol (ZYLOPRIM) 100 MG tablet   Oral   Take 100 mg by mouth daily.         Marland Kitchen aspirin 81 MG chewable tablet   Oral   Chew 1 tablet (81 mg total) by mouth daily.         . clopidogrel (PLAVIX) 75 MG tablet   Oral   Take 75 mg by mouth daily with breakfast.         . colchicine 0.6 MG tablet   Oral   Take 0.6 mg by mouth 2 (two) times daily. Then decrease to daily a symptoms allow         . dexlansoprazole (DEXILANT) 60 MG capsule   Oral   Take 60 mg by mouth daily.         . insulin detemir (LEVEMIR) 100 UNIT/ML injection   Subcutaneous   Inject 25-35 Units into the skin daily. 25  units every morning and 35 units at bedtime         . nebivolol (BYSTOLIC) 5 MG tablet   Oral   Take 5 mg by mouth daily.         . nitroGLYCERIN (NITROSTAT) 0.4 MG SL tablet   Sublingual   Place 1 tablet (0.4 mg total) under the tongue every 5 (five) minutes as needed for chest pain.   25 tablet   11   . rosuvastatin (CRESTOR) 10 MG tablet   Oral   Take 40 mg by mouth daily.          . sitaGLIPtin (JANUVIA) 100 MG tablet   Oral   Take 1 tablet (100 mg total) by mouth daily.   30 tablet   0    BP 128/61  Pulse 64  Temp(Src) 98.7 F (37.1 C) (Oral)  Resp 16  Wt 300 lb (136.079 kg)  SpO2 98% Physical Exam  Nursing note and vitals reviewed. Constitutional: He is oriented to person, place, and time. He appears well-developed and well-nourished. No distress.  HENT:  Head: Normocephalic and atraumatic.  Mouth/Throat: Oropharynx is  clear and moist.  Eyes: EOM are normal. Pupils are equal, round, and reactive to light.  Neck: Normal range of motion. Neck supple.  Cardiovascular: Normal rate and regular rhythm.   Pulmonary/Chest: Effort normal and breath sounds normal. No respiratory distress. He has no wheezes. He has no rales. He exhibits no tenderness.  Abdominal: Soft. Bowel sounds are normal. He exhibits no distension and no mass. There is no tenderness. There is no rebound and no guarding.  Musculoskeletal: Normal range of motion. He exhibits no edema and no tenderness.  Neurological: He is alert and oriented to person, place, and time.  Skin: Skin is warm and dry. No rash noted. No erythema.  Psychiatric: He has a normal mood and affect. His behavior is normal.    ED Course  Procedures (including critical care time) Labs Review Labs Reviewed  CBC - Abnormal; Notable for the following:    HCT 38.2 (*)    All other components within normal limits  BASIC METABOLIC PANEL - Abnormal; Notable for the following:    Sodium 133 (*)    Chloride 93 (*)    Glucose, Bld 213 (*)    All other components within normal limits  CBG MONITORING, ED - Abnormal; Notable for the following:    Glucose-Capillary 196 (*)    All other components within normal limits  CBG MONITORING, ED - Abnormal; Notable for the following:    Glucose-Capillary 171 (*)    All other components within normal limits  TROPONIN I  I-STAT TROPOININ, ED   Imaging Review Dg Chest 2 View  01/23/2014   CLINICAL DATA:  Chest pain and tightness, history hypertension, diabetes, coronary artery disease post stenting  EXAM: CHEST  2 VIEW  COMPARISON:  05/12/2013  FINDINGS: Normal heart size, mediastinal contours, and pulmonary vascularity.  Lungs clear.  No pleural effusion or pneumothorax.  Scattered endplate spur formation thoracic spine.  IMPRESSION: No acute abnormalities.   Electronically Signed   By: Ulyses Southward M.D.   On: 01/23/2014 18:50     EKG  Interpretation   Date/Time:  Monday January 23 2014 20:08:30 EDT Ventricular Rate:  76 PR Interval:  164 QRS Duration: 88 QT Interval:  400 QTC Calculation: 450 R Axis:   17 Text Interpretation:  Normal sinus rhythm Nonspecific ST abnormality  Abnormal ECG Confirmed by Ranae Palms  MD,  Harout Scheurich ([id number]) on 01/23/2014  11:43:41 PM      MDM   Final diagnoses:  None    Discussed with Dr. Terressa KoyanagiBalfour. He will see the patient in emergency department.    Loren Raceravid Custer Pimenta, MD 01/24/14 [phone]

## 2014-01-24 NOTE — Progress Notes (Signed)
ANTICOAGULATION CONSULT NOTE - Initial Consult  Pharmacy Consult for heparin Indication: USAP  Allergies  Allergen Reactions  . Prednisone     Mood swings    Patient Measurements: Height: 6' (182.9 cm) Weight: 329 lb 14.4 oz (149.642 kg) IBW/kg (Calculated) : 77.6 Heparin Dosing Weight: 120kg  Vital Signs: Temp: 98.2 F (36.8 C) (03/17 0204) Temp src: Oral (03/17 0204) BP: 122/67 mmHg (03/17 0204) Pulse Rate: 62 (03/17 0204)  Labs:  Recent Labs  01/23/14 1827 01/23/14 2352  HGB 13.1  --   HCT 38.2*  --   PLT 294  --   CREATININE 0.74  --   TROPONINI  --  <0.30    Estimated Creatinine Clearance: 179.2 ml/min (by C-G formula based on Cr of 0.74).   Medical History: Past Medical History  Diagnosis Date  . Diabetes mellitus   . GERD (gastroesophageal reflux disease)   . Gout   . Hyperlipidemia   . Hypertension   . Obesity   . CAD (coronary artery disease)     a. NSTEMI 7/14:  LHC with mCFX 100% => Promus premier DES; EF 65%    Medications:  Prescriptions prior to admission  Medication Sig Dispense Refill  . allopurinol (ZYLOPRIM) 100 MG tablet Take 100 mg by mouth daily.      Marland Kitchen. aspirin 81 MG chewable tablet Chew 1 tablet (81 mg total) by mouth daily.      . clopidogrel (PLAVIX) 75 MG tablet Take 75 mg by mouth daily with breakfast.      . colchicine 0.6 MG tablet Take 0.6 mg by mouth 2 (two) times daily. Then decrease to daily a symptoms allow      . dexlansoprazole (DEXILANT) 60 MG capsule Take 60 mg by mouth daily.      . insulin detemir (LEVEMIR) 100 UNIT/ML injection Inject 25-35 Units into the skin daily. 25 units every morning and 35 units at bedtime      . nebivolol (BYSTOLIC) 5 MG tablet Take 5 mg by mouth daily.      . nitroGLYCERIN (NITROSTAT) 0.4 MG SL tablet Place 1 tablet (0.4 mg total) under the tongue every 5 (five) minutes as needed for chest pain.  25 tablet  11  . rosuvastatin (CRESTOR) 10 MG tablet Take 40 mg by mouth daily.       .  sitaGLIPtin (JANUVIA) 100 MG tablet Take 1 tablet (100 mg total) by mouth daily.  30 tablet  0   Scheduled:  . allopurinol  100 mg Oral Daily  . [START ON 01/25/2014] aspirin EC  81 mg Oral Daily  . atorvastatin  40 mg Oral q1800  . clopidogrel  75 mg Oral Q breakfast  . colchicine  0.6 mg Oral BID  . insulin aspart  0-15 Units Subcutaneous TID WC  . insulin aspart  0-5 Units Subcutaneous QHS  . nebivolol  5 mg Oral Daily  . pantoprazole  40 mg Oral Daily   Infusions:  . sodium chloride      Assessment: 43yo male had STEMI <5527yr ago, successfully treated w/ PCI, no cardiac c/o since but now c/o sudden onset of CP and nausea, initial troponin negative, to begin heparin for USAP.  Goal of Therapy:  Heparin level 0.3-0.7 units/ml Monitor platelets by anticoagulation protocol: Yes   Plan:  Will give heparin 4000 units x1 followed by gtt at 1600 units/hr and monitor heparin levels and CBC.  Vernard GamblesVeronda Ekam Bonebrake, PharmD, BCPS  01/24/2014,2:06 AM

## 2014-01-24 NOTE — H&P (Signed)
Cardiology History and Physical  Annye Asa, MD  History of Present Illness (and review of medical records): Aaron Franco is a 43 y.o. male who presents for evaluation of chest pain.  He has hx of CAD with inferior STEMI 05/2013, HTN, HLD, DM, obesity and is currently followed by [facility].  He was treated with DES to circumflex and discharged on ASA/Plavix.  He has been doing well since then with no repeat admissions and/or use of NTG.  Today he felt overal generalized malaise after rushing to run errands during the day prior to work getting to work at [employer].  He then developed chest pain that started on his right and radiated to center of chest around 5pm.  Pain was around 8/10.  He did also have shortness of breath.  He took one NTG with no relief.  He then left work as symptoms did not improve and came to the ED.  He received ASA and 2 more NTG and pain is now down to 1/10.  EKG shows no acute changes and POC trop is negative.  Previous diagnostic testing for coronary artery disease includes: cardiac catheterization, echocardiogram and exercise treadmill test. Previous history of cardiac disease includes Angina Coronary Artery Disease Coronary Artery Stent MI. Coronary artery disease risk factors include: diabetes mellitus, dyslipidemia, hypertension, male gender, obesity (BMI >= 30 kg/m2) and smoking/ tobacco exposure. Patient denies history of arrhythmia, CABG and valvular disease.  Cath 05/2013: 1. Non ST elevation MI due to total occlusion of the mid to distal circumflex supplying a small distribution.  2. Successful drug-eluting stent implantation in the mid to distal circumflex reducing the stenosis from 100% to 0% with TIMI grade 3 flow  3. Widely patent LAD, proximal circumflex, and right coronary. The LAD, circumflex, and mid right coronary contains moderate luminal irregularities.  4. Normal overall left ventricular function.  Review of Systems Further review of  systems was otherwise negative other than stated in HPI.  Patient Active Problem List   Diagnosis Date Noted  . Murmur 02/21/2011  . SINUSITIS - ACUTE-NOS 01/09/2011  . CHEST PAIN 12/27/2010  . EDEMA- LOCALIZED 12/24/2010  . SHOULDER PAIN 10/16/2010  . VITAMIN B12 DEFICIENCY 07/19/2010  . OBESITY 07/19/2010  . DECREASED LIBIDO 07/19/2010  . STRAIN, CHEST WALL 07/19/2010  . OTHER VITAMIN B12 DEFICIENCY ANEMIA 10/31/2009  . PAIN IN JOINT, HAND 10/16/2009  . TRIGGER FINGER 10/16/2009  . BACK STRAIN, LUMBAR 10/16/2009  . DM 08/17/2009  . RHINITIS 02/06/2009  . HYPERLIPIDEMIA 03/01/2007  . GOUT 03/01/2007  . HYPERTENSION 03/01/2007  . GERD 03/01/2007   Past Medical History  Diagnosis Date  . Diabetes mellitus   . GERD (gastroesophageal reflux disease)   . Gout   . Hyperlipidemia   . Hypertension   . Obesity   . CAD (coronary artery disease)     a. NSTEMI 7/14:  LHC with mCFX 100% => Promus premier DES; EF 65%    Past Surgical History  Procedure Laterality Date  . Biceps tendon repair  Nov 13 2010    right  . Hand surgery  1990    left     (Not in a hospital admission) Allergies  Allergen Reactions  . Prednisone     Mood swings    History  Substance Use Topics  . Smoking status: Former Smoker    Types: Cigarettes    Quit date: 11/10/1998  . Smokeless tobacco: Never Used     Comment: pt only smoked 1-2 cigs a day,  not everyday on occs x 2 years  . Alcohol Use: Yes     Comment: rare    Family History  Problem Relation Age of Onset  . Heart attack Father     deceased  . Stroke Mother   . Diabetes Father   . Pancreatic cancer Maternal Aunt   . Colon cancer Neg Hx   . Heart failure Father      Objective:  Patient Vitals for the past 8 hrs:  BP Temp Temp src Pulse Resp SpO2 Weight  01/24/14 0015 111/64 mmHg - - 73 14 98 % -  01/24/14 0000 128/61 mmHg - - 66 16 100 % -  01/23/14 2345 130/70 mmHg - - 68 15 100 % -  01/23/14 2307 115/69 mmHg - - 76 18 98 %  -  01/23/14 1955 129/66 mmHg 98.7 F (37.1 C) Oral 99 18 100 % -  01/23/14 1747 141/82 mmHg 98.4 F (36.9 C) Oral 80 18 96 % 136.079 kg (300 lb)   General appearance: alert, cooperative, appears stated age, obese male in NAD Head: Normocephalic, without obvious abnormality, atraumatic Eyes: conjunctivae/corneas clear. PERRL, EOM's intact. Fundi benign. Neck: no carotid bruit, no JVD and supple, symmetrical, trachea midline Lungs: clear to auscultation bilaterally Chest wall: no tenderness Heart: regular rate and rhythm, S1, S2 normal, no murmur, click, rub or gallop Abdomen: soft, non-tender; bowel sounds normal; no masses,  no organomegaly Extremities: extremities normal, atraumatic, no cyanosis or edema Pulses: 2+ and symmetric Neurologic: Grossly normal  Results for orders placed during the hospital encounter of 01/23/14 (from the past 48 hour(s))  CBG MONITORING, ED     Status: Abnormal   Collection Time    01/23/14  6:24 PM      Result Value Ref Range   Glucose-Capillary 196 (*) 70 - 99 mg/dL  CBC     Status: Abnormal   Collection Time    01/23/14  6:27 PM      Result Value Ref Range   WBC 8.7  4.0 - 10.5 K/uL   RBC 4.74  4.22 - 5.81 MIL/uL   Hemoglobin 13.1  13.0 - 17.0 g/dL   HCT 38.2 (*) 39.0 - 52.0 %   MCV 80.6  78.0 - 100.0 fL   MCH 27.6  26.0 - 34.0 pg   MCHC 34.3  30.0 - 36.0 g/dL   RDW 13.3  11.5 - 15.5 %   Platelets 294  150 - 400 K/uL  BASIC METABOLIC PANEL     Status: Abnormal   Collection Time    01/23/14  6:27 PM      Result Value Ref Range   Sodium 133 (*) 137 - 147 mEq/L   Potassium 3.8  3.7 - 5.3 mEq/L   Chloride 93 (*) 96 - 112 mEq/L   CO2 25  19 - 32 mEq/L   Glucose, Bld 213 (*) 70 - 99 mg/dL   BUN 9  6 - 23 mg/dL   Creatinine, Ser 0.74  0.50 - 1.35 mg/dL   Calcium 9.6  8.4 - 10.5 mg/dL   GFR calc non Af Amer >90  >90 mL/min   GFR calc Af Amer >90  >90 mL/min   Comment: (NOTE)     The eGFR has been calculated using the CKD EPI equation.      This calculation has not been validated in all clinical situations.     eGFR's persistently <90 mL/min signify possible Chronic Kidney     Disease.  Randolm Idol, ED     Status: None   Collection Time    01/23/14  6:48 PM      Result Value Ref Range   Troponin i, poc 0.00  0.00 - 0.08 ng/mL   Comment 3            Comment: Due to the release kinetics of cTnI,     a negative result within the first hours     of the onset of symptoms does not rule out     myocardial infarction with certainty.     If myocardial infarction is still suspected,     repeat the test at appropriate intervals.  CBG MONITORING, ED     Status: Abnormal   Collection Time    01/23/14 11:09 PM      Result Value Ref Range   Glucose-Capillary 171 (*) 70 - 99 mg/dL   Comment 1 Notify RN     Comment 2 Documented in Chart     Dg Chest 2 View  01/23/2014   CLINICAL DATA:  Chest pain and tightness, history hypertension, diabetes, coronary artery disease post stenting  EXAM: CHEST  2 VIEW  COMPARISON:  05/12/2013  FINDINGS: Normal heart size, mediastinal contours, and pulmonary vascularity.  Lungs clear.  No pleural effusion or pneumothorax.  Scattered endplate spur formation thoracic spine.  IMPRESSION: No acute abnormalities.   Electronically Signed   By: Lavonia Dana M.D.   On: 01/23/2014 18:50    ECG:  Sinus rhythm HR 76, no acute ischemic changes, baseline artifact, no significant change since last ecg post MI  Assessment: 17M hx of Inferior STEMI s/p DES to Circumflex 05/2013 p/w likely unstable angina.  Unstable Angina CAD s/p DES to Circumflex HTN HLD DM Obesity  Plan:  1. Cardiology Admission  2. Continuous monitoring on Telemetry. 3. Repeat ekg on admit, prn chest pain or arrythmia 4. Cycle cardiac biomarkers, check lipids, hgba1c, tsh 5. Medical management to include ASA,Plavix, Heparin, BB, Statin, NTG prn 6. SSI for DM 7. Gentle IVFs 8. NPO @ MN 9. Reassess in am, pending clinical course and  biomarkers, will likely need ischemic evaluation.

## 2014-01-24 NOTE — Progress Notes (Signed)
ANTICOAGULATION CONSULT NOTE - Follow Up Consult  Pharmacy Consult for heparin Indication: chest pain/ACS  Allergies  Allergen Reactions  . Prednisone     Mood swings    Patient Measurements: Height: 6' (182.9 cm) Weight: 329 lb 14.4 oz (149.642 kg) IBW/kg (Calculated) : 77.6 Heparin Dosing Weight: 120 kg  Vital Signs: Temp: 97.9 F (36.6 C) (03/17 0546) Temp src: Oral (03/17 0546) BP: 101/57 mmHg (03/17 0546) Pulse Rate: 56 (03/17 0546)  Labs:  Recent Labs  01/23/14 1827 01/23/14 2352 01/24/14 0456 01/24/14 0920  HGB 13.1  --  12.7* 12.7*  HCT 38.2*  --  38.0* 37.5*  PLT 294  --  291 260  LABPROT  --   --  14.9  --   INR  --   --  1.20  --   HEPARINUNFRC  --   --   --  0.25*  CREATININE 0.74  --  0.68  --   TROPONINI  --  <0.30 <0.30 <0.30    Estimated Creatinine Clearance: 179.2 ml/min (by C-G formula based on Cr of 0.68).   Assessment: Patient is a 43 y.o M on heparin for CP with plan for LHC today.  First heparin level now back subtherapeutic at 0.25.  No bleeding documented.  Goal of Therapy:  Heparin level 0.3-0.7 units/ml Monitor platelets by anticoagulation protocol: Yes   Plan:  1) increase heparin drip to 1800 units/hr 2) Will recheck another 6 hour heparin level to assess new rate 3) f/u after cath  Nabila Albarracin P 01/24/2014,11:36 AM

## 2014-01-24 NOTE — CV Procedure (Signed)
    Cardiac Catheterization Procedure Note  Name: Aaron Franco MRN: [id number] DOB: 02-19-1971  Procedure: Left Heart Cath, Selective Coronary Angiography, LV angiography  Indication: 43 yo BM with history of CAD s/p stenting of the distal LCx in July 2014 during an MI. He presents now with recurrent chest pain.   Procedural Details: The right wrist was prepped, draped, and anesthetized with 1% lidocaine. Using the modified Seldinger technique, a 5 French sheath was introduced into the right radial artery. 3 mg of verapamil was administered through the sheath, weight-based unfractionated heparin was administered intravenously. Standard Judkins catheters were used for selective coronary angiography and left ventriculography. Catheter exchanges were performed over an exchange length guidewire. There were no immediate procedural complications. A TR band was used for radial hemostasis at the completion of the procedure.  The patient was transferred to the post catheterization recovery area for further monitoring.  Procedural Findings: Hemodynamics: AO 126/72 mean 96 mm Hg LV 125/19 mm Hg  Coronary angiography: Coronary dominance: right  Left mainstem: Normal  Left anterior descending (LAD): Diffuse mild irregularities. 20-30% distal LAD.  Left circumflex (LCx): Large codominant vessel. The first and second OM are large vessels and are normal. The distal LCx is widely patent at the stent site.   Right coronary artery (RCA): The RCA has diffuse disease up to 30-40% in the mid vessel.   Left ventriculography: Left ventricular systolic function is normal, LVEF is estimated at 55-65%, there is no significant mitral regurgitation   Final Conclusions:   1. Nonobstructive CAD 2. Normal LV function.  Recommendations: Recommend continued medical therapy. P2Y12 assay indicates he is a nonresponder to Plavix. History of intolerance to Brilinta. Will switch to Effient for duration of  therapy.  [name] University Of Ky HospitalJordanMD,FACC 01/24/2014, 4:35 PM

## 2014-01-25 ENCOUNTER — Telehealth: Payer: Self-pay | Admitting: *Deleted

## 2014-01-25 ENCOUNTER — Telehealth: Payer: Self-pay | Admitting: Cardiology

## 2014-01-25 NOTE — Telephone Encounter (Signed)
Returned call.  Left message that letter written out until Monday and can be picked up at office (address given).  Call back if any questions tomorrow between 8am anf 4pm.    No pt-identifiable information left on voicemail.

## 2014-01-25 NOTE — Telephone Encounter (Signed)
He was just discharged from the hospital last night. He was given a note by the PA to go back work on 3-19.Please call,he need an extension on his nite and need no restrictions.

## 2014-01-25 NOTE — Telephone Encounter (Signed)
Message forwarded to B. Hager, PA-C.

## 2014-01-25 NOTE — Telephone Encounter (Signed)
Pt wife called back stating that she called his primary cardiologist for an extension on his work extension note. Dr. Bary CastillaKalil was unable to do it being he did not perform the surgery. He told her that Dr. Herbie BaltimoreHarding had to write the extension for work.

## 2014-01-25 NOTE — Telephone Encounter (Signed)
Out until Monday, January 30, 2014 [place], [facility]

## 2014-01-25 NOTE — Telephone Encounter (Signed)
Returned call and pt verified x 2.  Pt put wife on phone.  Stated pt received a note from the PA that he could be out of work from the 17th thru the 19th and cannot lift anything > 10 lbs.  Stated pt needs a change in the note for an extension and w/o restrictions.  Wife advised to contact pt's primary cardiologist r/t concerns as they should make any changes to note.  Verbalized understanding and agreed w/ plan.

## 2014-01-26 ENCOUNTER — Telehealth: Payer: Self-pay | Admitting: Physician Assistant

## 2014-01-26 NOTE — Telephone Encounter (Signed)
Would you please fax his note for work to: WUJ:[phone]:[phone].Thank you so much.

## 2014-01-26 NOTE — Telephone Encounter (Signed)
Returned call and pt verified x 2 w/ Christa.  Informed message received and cannot fax w/o signed release.  Christa asked if it could be mailed and informed it can, but if pt needing it soon he should pick it up as mail would not leave office until tomorrow afternoon.  Verbalized understanding and stated they will pick it up.  Address given.

## 2014-02-16 DIAGNOSIS — E1122 Type 2 diabetes mellitus with diabetic chronic kidney disease: Secondary | ICD-10-CM | POA: Insufficient documentation

## 2014-02-16 DIAGNOSIS — I252 Old myocardial infarction: Secondary | ICD-10-CM | POA: Insufficient documentation

## 2014-02-16 DIAGNOSIS — M2040 Other hammer toe(s) (acquired), unspecified foot: Secondary | ICD-10-CM | POA: Insufficient documentation

## 2014-02-16 DIAGNOSIS — E1129 Type 2 diabetes mellitus with other diabetic kidney complication: Secondary | ICD-10-CM | POA: Insufficient documentation

## 2014-10-19 ENCOUNTER — Encounter (HOSPITAL_COMMUNITY): Payer: Self-pay | Admitting: Interventional Cardiology

## 2015-04-23 ENCOUNTER — Emergency Department (HOSPITAL_COMMUNITY): Payer: BC Managed Care – PPO

## 2015-04-23 ENCOUNTER — Observation Stay (HOSPITAL_COMMUNITY)
Admission: EM | Admit: 2015-04-23 | Discharge: 2015-04-24 | Disposition: A | Discharge status: Home / Self Care | Payer: BC Managed Care – PPO | Attending: Internal Medicine | Admitting: Internal Medicine

## 2015-04-23 ENCOUNTER — Encounter (HOSPITAL_COMMUNITY): Payer: Self-pay | Admitting: Emergency Medicine

## 2015-04-23 DIAGNOSIS — M109 Gout, unspecified: Secondary | ICD-10-CM | POA: Diagnosis not present

## 2015-04-23 DIAGNOSIS — E785 Hyperlipidemia, unspecified: Secondary | ICD-10-CM | POA: Diagnosis present

## 2015-04-23 DIAGNOSIS — Z87891 Personal history of nicotine dependence: Secondary | ICD-10-CM | POA: Diagnosis not present

## 2015-04-23 DIAGNOSIS — Z794 Long term (current) use of insulin: Secondary | ICD-10-CM | POA: Diagnosis not present

## 2015-04-23 DIAGNOSIS — I2510 Atherosclerotic heart disease of native coronary artery without angina pectoris: Secondary | ICD-10-CM | POA: Diagnosis not present

## 2015-04-23 DIAGNOSIS — E119 Type 2 diabetes mellitus without complications: Secondary | ICD-10-CM | POA: Diagnosis present

## 2015-04-23 DIAGNOSIS — I25110 Atherosclerotic heart disease of native coronary artery with unstable angina pectoris: Secondary | ICD-10-CM

## 2015-04-23 DIAGNOSIS — E1165 Type 2 diabetes mellitus with hyperglycemia: Secondary | ICD-10-CM | POA: Insufficient documentation

## 2015-04-23 DIAGNOSIS — B370 Candidal stomatitis: Secondary | ICD-10-CM | POA: Diagnosis present

## 2015-04-23 DIAGNOSIS — Z9889 Other specified postprocedural states: Secondary | ICD-10-CM | POA: Diagnosis not present

## 2015-04-23 DIAGNOSIS — K219 Gastro-esophageal reflux disease without esophagitis: Secondary | ICD-10-CM | POA: Diagnosis not present

## 2015-04-23 DIAGNOSIS — Z79899 Other long term (current) drug therapy: Secondary | ICD-10-CM | POA: Insufficient documentation

## 2015-04-23 DIAGNOSIS — R739 Hyperglycemia, unspecified: Secondary | ICD-10-CM

## 2015-04-23 DIAGNOSIS — E1140 Type 2 diabetes mellitus with diabetic neuropathy, unspecified: Secondary | ICD-10-CM | POA: Diagnosis present

## 2015-04-23 DIAGNOSIS — E669 Obesity, unspecified: Secondary | ICD-10-CM | POA: Diagnosis not present

## 2015-04-23 DIAGNOSIS — R079 Chest pain, unspecified: Principal | ICD-10-CM | POA: Insufficient documentation

## 2015-04-23 DIAGNOSIS — Z7982 Long term (current) use of aspirin: Secondary | ICD-10-CM | POA: Diagnosis not present

## 2015-04-23 DIAGNOSIS — I10 Essential (primary) hypertension: Secondary | ICD-10-CM | POA: Diagnosis not present

## 2015-04-23 DIAGNOSIS — I252 Old myocardial infarction: Secondary | ICD-10-CM | POA: Diagnosis not present

## 2015-04-23 LAB — BRAIN NATRIURETIC PEPTIDE: B Natriuretic Peptide: 30.8 pg/mL (ref 0.0–100.0)

## 2015-04-23 LAB — DIFFERENTIAL
BASOS PCT: 0 % (ref 0–1)
Basophils Absolute: 0 10*3/uL (ref 0.0–0.1)
Eosinophils Absolute: 0 10*3/uL (ref 0.0–0.7)
Eosinophils Relative: 0 % (ref 0–5)
LYMPHS ABS: 2.1 10*3/uL (ref 0.7–4.0)
Lymphocytes Relative: 27 % (ref 12–46)
MONOS PCT: 5 % (ref 3–12)
Monocytes Absolute: 0.4 10*3/uL (ref 0.1–1.0)
NEUTROS ABS: 5.2 10*3/uL (ref 1.7–7.7)
Neutrophils Relative %: 68 % (ref 43–77)

## 2015-04-23 LAB — BASIC METABOLIC PANEL
ANION GAP: 10 (ref 5–15)
BUN: 12 mg/dL (ref 6–20)
CO2: 22 mmol/L (ref 22–32)
CREATININE: 0.89 mg/dL (ref 0.61–1.24)
Calcium: 9.4 mg/dL (ref 8.9–10.3)
Chloride: 103 mmol/L (ref 101–111)
GFR calc Af Amer: >60 mL/min (ref >60)
GFR calc non Af Amer: >60 mL/min (ref >60)
GLUCOSE: 332 mg/dL — AB (ref 65–99)
Potassium: 3.7 mmol/L (ref 3.5–5.1)
Sodium: 135 mmol/L (ref 135–145)

## 2015-04-23 LAB — I-STAT TROPONIN, ED: Troponin i, poc: 0 ng/mL (ref 0.00–0.08)

## 2015-04-23 LAB — MRSA PCR SCREENING: MRSA by PCR: NEGATIVE

## 2015-04-23 LAB — CBC
HEMATOCRIT: 39.4 % (ref 39.0–52.0)
Hemoglobin: 12.9 g/dL — ABNORMAL LOW (ref 13.0–17.0)
MCH: 26.3 pg (ref 26.0–34.0)
MCHC: 32.7 g/dL (ref 30.0–36.0)
MCV: 80.2 fL (ref 78.0–100.0)
PLATELETS: 257 10*3/uL (ref 150–400)
RBC: 4.91 MIL/uL (ref 4.22–5.81)
RDW: 13.4 % (ref 11.5–15.5)
WBC: 7.8 10*3/uL (ref 4.0–10.5)

## 2015-04-23 LAB — HEPARIN LEVEL (UNFRACTIONATED): Heparin Unfractionated: 0.16 IU/mL — ABNORMAL LOW (ref 0.30–0.70)

## 2015-04-23 LAB — GLUCOSE, CAPILLARY
Glucose-Capillary: 270 mg/dL — ABNORMAL HIGH (ref 65–99)
Glucose-Capillary: 223 mg/dL — ABNORMAL HIGH (ref 65–99)

## 2015-04-23 LAB — TROPONIN I: Troponin I: <0.03 ng/mL (ref <0.031)

## 2015-04-23 MED ORDER — PANTOPRAZOLE SODIUM 40 MG PO TBEC
40.0000 mg | DELAYED_RELEASE_TABLET | Freq: Two times a day (BID) | ORAL | Status: DC
  Administered 2015-04-23 – 2015-04-24 (×2): 40 mg via ORAL
  Filled 2015-04-23 (×2): qty 1

## 2015-04-23 MED ORDER — INSULIN ASPART 100 UNIT/ML ~~LOC~~ SOLN
0.0000 [IU] | Freq: Three times a day (TID) | SUBCUTANEOUS | Status: DC
  Administered 2015-04-24: 11 [IU] via SUBCUTANEOUS
  Administered 2015-04-24: 7 [IU] via SUBCUTANEOUS

## 2015-04-23 MED ORDER — ROSUVASTATIN CALCIUM 10 MG PO TABS
40.0000 mg | ORAL_TABLET | Freq: Every day | ORAL | Status: DC
  Administered 2015-04-24: 40 mg via ORAL
  Filled 2015-04-23: qty 4

## 2015-04-23 MED ORDER — HEPARIN BOLUS VIA INFUSION
4000.0000 [IU] | Freq: Once | INTRAVENOUS | Status: AC
  Administered 2015-04-23: 4000 [IU] via INTRAVENOUS
  Filled 2015-04-23: qty 4000

## 2015-04-23 MED ORDER — ACETAMINOPHEN 325 MG PO TABS
650.0000 mg | ORAL_TABLET | Freq: Four times a day (QID) | ORAL | Status: DC | PRN
  Administered 2015-04-23: 650 mg via ORAL
  Filled 2015-04-23: qty 2

## 2015-04-23 MED ORDER — LINAGLIPTIN 5 MG PO TABS
5.0000 mg | ORAL_TABLET | Freq: Every day | ORAL | Status: DC
  Administered 2015-04-24: 5 mg via ORAL
  Filled 2015-04-23: qty 1

## 2015-04-23 MED ORDER — INSULIN ASPART 100 UNIT/ML ~~LOC~~ SOLN
0.0000 [IU] | Freq: Every day | SUBCUTANEOUS | Status: DC
  Administered 2015-04-23: 2 [IU] via SUBCUTANEOUS

## 2015-04-23 MED ORDER — MORPHINE SULFATE 2 MG/ML IJ SOLN: 2.0000 mg | INTRAMUSCULAR | Status: DC | PRN

## 2015-04-23 MED ORDER — CLOPIDOGREL BISULFATE 75 MG PO TABS
75.0000 mg | ORAL_TABLET | Freq: Every day | ORAL | Status: DC
  Administered 2015-04-24: 75 mg via ORAL
  Filled 2015-04-23: qty 1

## 2015-04-23 MED ORDER — MORPHINE SULFATE 4 MG/ML IJ SOLN
4.0000 mg | Freq: Once | INTRAMUSCULAR | Status: AC
  Administered 2015-04-23: 4 mg via INTRAVENOUS
  Filled 2015-04-23: qty 1

## 2015-04-23 MED ORDER — INSULIN DETEMIR 100 UNIT/ML ~~LOC~~ SOLN: 25.0000 [IU] | Freq: Every day | SUBCUTANEOUS | Status: DC

## 2015-04-23 MED ORDER — NITROGLYCERIN IN D5W 200-5 MCG/ML-% IV SOLN
10.0000 ug/min | INTRAVENOUS | Status: DC
  Administered 2015-04-23: 10 ug/min via INTRAVENOUS
  Filled 2015-04-23: qty 250

## 2015-04-23 MED ORDER — ASPIRIN 81 MG PO CHEW
81.0000 mg | CHEWABLE_TABLET | Freq: Every day | ORAL | Status: DC
  Administered 2015-04-24: 81 mg via ORAL
  Filled 2015-04-23: qty 1

## 2015-04-23 MED ORDER — NEBIVOLOL HCL 5 MG PO TABS
5.0000 mg | ORAL_TABLET | Freq: Every day | ORAL | Status: DC
  Administered 2015-04-24: 5 mg via ORAL
  Filled 2015-04-23: qty 1

## 2015-04-23 MED ORDER — SODIUM CHLORIDE 0.9 % IJ SOLN
3.0000 mL | Freq: Two times a day (BID) | INTRAMUSCULAR | Status: DC
  Administered 2015-04-23: 3 mL via INTRAVENOUS

## 2015-04-23 MED ORDER — INSULIN ASPART 100 UNIT/ML ~~LOC~~ SOLN
12.0000 [IU] | Freq: Once | SUBCUTANEOUS | Status: AC
  Administered 2015-04-23: 12 [IU] via SUBCUTANEOUS

## 2015-04-23 MED ORDER — ALLOPURINOL 100 MG PO TABS
100.0000 mg | ORAL_TABLET | Freq: Every day | ORAL | Status: DC
  Administered 2015-04-24: 100 mg via ORAL
  Filled 2015-04-23: qty 1

## 2015-04-23 MED ORDER — TRAZODONE HCL 50 MG PO TABS: 50.0000 mg | ORAL_TABLET | Freq: Every evening | ORAL | Status: DC | PRN

## 2015-04-23 MED ORDER — ACETAMINOPHEN 650 MG RE SUPP: 650.0000 mg | Freq: Four times a day (QID) | RECTAL | Status: DC | PRN

## 2015-04-23 MED ORDER — LOSARTAN POTASSIUM 50 MG PO TABS
50.0000 mg | ORAL_TABLET | Freq: Every day | ORAL | Status: DC
  Administered 2015-04-24: 50 mg via ORAL
  Filled 2015-04-23: qty 1

## 2015-04-23 MED ORDER — HYDROCODONE-ACETAMINOPHEN 5-325 MG PO TABS: 1.0000 | ORAL_TABLET | ORAL | Status: DC | PRN

## 2015-04-23 MED ORDER — INSULIN DETEMIR 100 UNIT/ML ~~LOC~~ SOLN
25.0000 [IU] | Freq: Every day | SUBCUTANEOUS | Status: DC
  Administered 2015-04-24: 25 [IU] via SUBCUTANEOUS
  Filled 2015-04-23: qty 0.25

## 2015-04-23 MED ORDER — ALUM & MAG HYDROXIDE-SIMETH 200-200-20 MG/5ML PO SUSP: 30.0000 mL | Freq: Four times a day (QID) | ORAL | Status: DC | PRN

## 2015-04-23 MED ORDER — ONDANSETRON HCL 4 MG/2ML IJ SOLN: 4.0000 mg | Freq: Four times a day (QID) | INTRAMUSCULAR | Status: DC | PRN

## 2015-04-23 MED ORDER — NYSTATIN 100000 UNIT/ML MT SUSP
5.0000 mL | Freq: Four times a day (QID) | OROMUCOSAL | Status: DC
  Administered 2015-04-23 – 2015-04-24 (×2): 500000 [IU] via ORAL
  Filled 2015-04-23 (×3): qty 5

## 2015-04-23 MED ORDER — HEPARIN BOLUS VIA INFUSION
2000.0000 [IU] | Freq: Once | INTRAVENOUS | Status: AC
  Administered 2015-04-24: 2000 [IU] via INTRAVENOUS
  Filled 2015-04-23: qty 2000

## 2015-04-23 MED ORDER — INSULIN DETEMIR 100 UNIT/ML ~~LOC~~ SOLN
35.0000 [IU] | Freq: Every day | SUBCUTANEOUS | Status: DC
  Administered 2015-04-23: 35 [IU] via SUBCUTANEOUS
  Filled 2015-04-23 (×3): qty 0.35

## 2015-04-23 MED ORDER — ONDANSETRON HCL 4 MG PO TABS: 4.0000 mg | ORAL_TABLET | Freq: Four times a day (QID) | ORAL | Status: DC | PRN

## 2015-04-23 MED ORDER — HEPARIN (PORCINE) IN NACL 100-0.45 UNIT/ML-% IJ SOLN
1650.0000 [IU]/h | INTRAMUSCULAR | Status: DC
  Administered 2015-04-23: 1300 [IU]/h via INTRAVENOUS
  Administered 2015-04-24: 1650 [IU]/h via INTRAVENOUS
  Filled 2015-04-23 (×3): qty 250

## 2015-04-23 NOTE — Progress Notes (Signed)
ANTICOAGULATION CONSULT NOTE Pharmacy Consult for heparin  Indication: chest pain/ACS  Allergies  Allergen Reactions  . Metformin Other (See Comments)    Chest discomfort  . Prednisone     Mood swings    Patient Measurements: Height: 6' (182.9 cm) Weight: (!) 315 lb 1.6 oz (142.928 kg) IBW/kg (Calculated) : 77.6 Heparin Dosing Weight: 111.4 kg  Vital Signs: Temp: 98.5 F (36.9 C) (06/13 2000) Temp Source: Oral (06/13 1553) BP: 116/66 mmHg (06/13 2000) Pulse Rate: 68 (06/13 2000)  Labs:  Recent Labs  04/23/15 1610 04/23/15 1900 04/23/15 2300  HGB 12.9*  --   --   HCT 39.4  --   --   PLT 257  --   --   HEPARINUNFRC  --   --  0.16*  CREATININE 0.89  --   --   TROPONINI  --  <0.03  --     Estimated Creatinine Clearance: 155.4 mL/min (by C-G formula based on Cr of 0.89).  Assessment: 44 y.o. male with chest pain for heparin  Goal of Therapy:  Heparin level 0.3-0.7 units/ml Monitor platelets by anticoagulation protocol: Yes   Plan: Heparin 2000 units IV bolus, then increase heparin 1650 units/hr Follow-up am labs.   Geannie Risen, PharmD, BCPS  04/23/2015 11:37 PM

## 2015-04-23 NOTE — ED Provider Notes (Signed)
CSN: [id number]     Arrival date & time 04/23/15  1531 History   First MD Initiated Contact with Patient 04/23/15 1531     Chief Complaint  Patient presents with  . Chest Pain     (Consider location/radiation/quality/duration/timing/severity/associated sxs/prior Treatment) Patient is a 44 y.o. male presenting with chest pain. The history is provided by the patient.  Chest Pain He was getting ready for work when he noticed a tightness in his chest with associated dyspnea and nausea. There is no diaphoresis. He does have history of non-STEMI with stent placement. He immediately dialed time 911. EMS treated him with 3 nitroglycerin as well as some aspirin with some improvement in his discomfort. Initially rated his discomfort at 7/10 and is now down to 4/10. There is no radiation of his discomfort. This is his first episode of chest discomfort in about one year. He has not noticed any recent decrease in exercise tolerance. He is currently on clopidogrel and has been compliant with it.  Past Medical History  Diagnosis Date  . Diabetes mellitus   . GERD (gastroesophageal reflux disease)   . Gout   . Hyperlipidemia   . Hypertension   . Obesity   . CAD (coronary artery disease) 05/2013    NSTEMI:  LHC with mCFX 100% => 3.0 mm  x 20 mm Promus premier DES; EF 65%  . NSTEMI (non-ST elevated myocardial infarction) 05/2013   Past Surgical History  Procedure Laterality Date  . Biceps tendon repair  Nov 13 2010    right  . Hand surgery  1990    left  . Percutaneous coronary stent intervention (pci-s)  05/2013    mCx 3.50mm x 20 mm Promus P DES  . Left heart cath N/A 05/12/2013    Procedure: LEFT HEART CATH;  Surgeon: Lesleigh Noe, MD;  Location: [facility] CATH LAB;  Service: Cardiovascular;  Laterality: N/A;  . Percutaneous coronary stent intervention (pci-s)  05/12/2013    Procedure: PERCUTANEOUS CORONARY STENT INTERVENTION (PCI-S);  Surgeon: Lesleigh Noe, MD;  Location: [facility] CATH LAB;  Service:  Cardiovascular;;  . Left heart catheterization with coronary angiogram N/A 01/24/2014    Procedure: LEFT HEART CATHETERIZATION WITH CORONARY ANGIOGRAM;  Surgeon: Peter M Swaziland, MD;  Location: [facility] CATH LAB;  Service: Cardiovascular;  Laterality: N/A;   Family History  Problem Relation Age of Onset  . Heart attack Father     deceased  . Stroke Mother   . Diabetes Father   . Pancreatic cancer Maternal Aunt   . Colon cancer Neg Hx   . Heart failure Father    History  Substance Use Topics  . Smoking status: Former Smoker    Types: Cigarettes    Quit date: 11/10/1998  . Smokeless tobacco: Never Used     Comment: pt only smoked 1-2 cigs a day, not everyday on occs x 2 years  . Alcohol Use: Yes     Comment: rare    Review of Systems  Cardiovascular: Positive for chest pain.  All other systems reviewed and are negative.     Allergies  Prednisone  Home Medications   Prior to Admission medications   Medication Sig Start Date End Date Taking? Authorizing Provider  allopurinol (ZYLOPRIM) 100 MG tablet Take 100 mg by mouth daily.    Historical Provider, MD  aspirin 81 MG chewable tablet Chew 1 tablet (81 mg total) by mouth daily. 05/14/13   Beatrice Lecher, PA-C  colchicine 0.6 MG tablet  Take 0.6 mg by mouth 2 (two) times daily. Then decrease to daily a symptoms allow    Historical Provider, MD  dexlansoprazole (DEXILANT) 60 MG capsule Take 60 mg by mouth daily.    Historical Provider, MD  insulin detemir (LEVEMIR) 100 UNIT/ML injection Inject 25-35 Units into the skin daily. 25 units every morning and 35 units at bedtime    Historical Provider, MD  nebivolol (BYSTOLIC) 5 MG tablet Take 5 mg by mouth daily.    Historical Provider, MD  nitroGLYCERIN (NITROSTAT) 0.4 MG SL tablet Place 1 tablet (0.4 mg total) under the tongue every 5 (five) minutes as needed for chest pain. 05/14/13 05/14/14  Beatrice Lecher, PA-C  prasugrel (EFFIENT) 10 MG TABS tablet Take 1 tablet (10 mg total) by mouth daily.  01/24/14   Dwana Melena, PA-C  rosuvastatin (CRESTOR) 10 MG tablet Take 40 mg by mouth daily.     Historical Provider, MD  sitaGLIPtin (JANUVIA) 100 MG tablet Take 1 tablet (100 mg total) by mouth daily. 04/27/12   Sheliah Hatch, MD   BP 145/71 mmHg  Pulse 66  Temp(Src) 99.2 F (37.3 C) (Oral)  Resp 13  SpO2 100% Physical Exam  Nursing note and vitals reviewed.  44 year old male, resting comfortably and in no acute distress. Vital signs are significant for mild hypertension. Oxygen saturation is 100%, which is normal. Head is normocephalic and atraumatic. PERRLA, EOMI. Oropharynx is clear. Neck is nontender and supple without adenopathy or JVD. Back is nontender and there is no CVA tenderness. Lungs are clear without rales, wheezes, or rhonchi. Chest is nontender. Heart has regular rate and rhythm without murmur. Abdomen is soft, flat, nontender without masses or hepatosplenomegaly and peristalsis is normoactive. Extremities have no cyanosis or edema, full range of motion is present. Skin is warm and dry without rash. Neurologic: Mental status is normal, cranial nerves are intact, there are no motor or sensory deficits.  ED Course  Procedures (including critical care time) Labs Review Results for orders placed or performed during the hospital encounter of 04/23/15  CBC  Result Value Ref Range   WBC 7.8 4.0 - 10.5 K/uL   RBC 4.91 4.22 - 5.81 MIL/uL   Hemoglobin 12.9 (L) 13.0 - 17.0 g/dL   HCT 41.3 24.4 - 01.0 %   MCV 80.2 78.0 - 100.0 fL   MCH 26.3 26.0 - 34.0 pg   MCHC 32.7 30.0 - 36.0 g/dL   RDW 27.2 53.6 - 64.4 %   Platelets 257 150 - 400 K/uL  Basic metabolic panel  Result Value Ref Range   Sodium 135 135 - 145 mmol/L   Potassium 3.7 3.5 - 5.1 mmol/L   Chloride 103 101 - 111 mmol/L   CO2 22 22 - 32 mmol/L   Glucose, Bld 332 (H) 65 - 99 mg/dL   BUN 12 6 - 20 mg/dL   Creatinine, Ser 0.34 0.61 - 1.24 mg/dL   Calcium 9.4 8.9 - 74.2 mg/dL   GFR calc non Af Amer >60  >60 mL/min   GFR calc Af Amer >60 >60 mL/min   Anion gap 10 5 - 15  BNP (order ONLY if patient complains of dyspnea/SOB AND you have documented it for THIS visit)  Result Value Ref Range   B Natriuretic Peptide 30.8 0.0 - 100.0 pg/mL  Differential  Result Value Ref Range   Neutrophils Relative % 68 43 - 77 %   Neutro Abs 5.2 1.7 - 7.7 K/uL  Lymphocytes Relative 27 12 - 46 %   Lymphs Abs 2.1 0.7 - 4.0 K/uL   Monocytes Relative 5 3 - 12 %   Monocytes Absolute 0.4 0.1 - 1.0 K/uL   Eosinophils Relative 0 0 - 5 %   Eosinophils Absolute 0.0 0.0 - 0.7 K/uL   Basophils Relative 0 0 - 1 %   Basophils Absolute 0.0 0.0 - 0.1 K/uL  I-stat troponin, ED  (not at [facility], [facility])  Result Value Ref Range   Troponin i, poc 0.00 0.00 - 0.08 ng/mL   Comment 3           Imaging Review Dg Chest Port 1 View  04/23/2015   CLINICAL DATA:  Acute chest pain and shortness of breath.  EXAM: PORTABLE CHEST - 1 VIEW  COMPARISON:  January 23, 2014.  FINDINGS: The heart size and mediastinal contours are within normal limits. Both lungs are clear. No pneumothorax or pleural effusion is noted. The visualized skeletal structures are unremarkable.  IMPRESSION: No acute cardiopulmonary abnormality seen.   Electronically Signed   By: Lupita Raider, M.D.   On: 04/23/2015 16:50     EKG Interpretation   Date/Time:  Monday April 23 2015 15:46:42 EDT Ventricular Rate:  62 PR Interval:  158 QRS Duration: 88 QT Interval:  414 QTC Calculation: 420 R Axis:   63 Text Interpretation:  Normal sinus rhythm Nonspecific ST and T wave  abnormality Abnormal ECG When compared with ECG of 01/24/2014, No  significant change was found Confirmed by [facility]  MD, Meribeth Vitug ([id number]) on  04/23/2015 3:57:33 PM      MDM   Final diagnoses:  Chest pain, unspecified chest pain type  Hyperglycemia    Chest discomfort in patient with known coronary artery disease. Old records reviewed and he had successful stent placement for occluded distal  circumflex artery 2 years ago and subsequent catheterization showed patent stent and no other coronary artery obstructions greater than 40%. Last catheterization was one year ago. Given the relatively recent catheterization showing no significant obstruction, it is unlikely that he has acute coronary syndrome. However, I also noted he was reported to have a genetic profile indicating resistance to clopidogrel. He had been placed on Effient but his cardiologist switched back to clopidogrel. He'll be treated as possible unstable angina. He started on heparin and nitroglycerin drips and is given morphine for pain. He will need to be admitted for serial troponins and consideration for additional cardiology evaluation.  Pain is completely relieved following nitroglycerin. Initial troponin is negative. Case is discussed with Dr. Lendell Caprice of [facility] agrees to admit the patient. Cardiology consultation will be obtained.  Dione Booze, MD 04/23/15 1736

## 2015-04-23 NOTE — ED Notes (Signed)
Pt started feeling SOB suddenly, then developed tightness across his chest. Tightness developed into pressure. Pt felt nauseous and lightheaded. Pt took 324mg  ASA and 3 nitro. Pt denies chest pain upon arrival to ED. Pt having some elevation in V2, V3. STEMI canceled en route to ED. Pt's EKG did not appear different from previous EKG. BP 138/98, HR 80, 96% RA.

## 2015-04-23 NOTE — H&P (Addendum)
[facility] History and Physical  Aaron Franco ZOX:[id number] DOB: 1971/05/31 DOA: 04/23/2015  Referring physician:  PCP: Malka So., MD   Chief Complaint: chest pressure  HPI: Aaron Franco is a 44 y.o. male  With h/o CAD, stent, normal EF, DM, GERD, Gout, HTN, HLD presents with about an hour worth of chest pressure substernal and left sided. No dyspnea, diaphoresis, palpitations. Improved with 3 nitros, then essentially resolved with nitro gtt, and morphine also started on heparin gtt in ed.  EKG without change from previous.  Troponin normal, CXR ok, CBG greater than 300.  Does not check cbgs. Cath last year : "Nonobstructive CAD 2. Normal LV function Recommend continued medical therapy. P2Y12 assay indicates he is a nonresponder to Plavix. History of intolerance to Brilinta. switched to Effient for duration of therapy". Admits not checking sugars. Now on plavix and asa because effient gave hime CP.  Also taking indomethacin for recent gout attack and pans dropping on his foot at work. Compliant with meds   Review of Systems:  Systems reviewed. As above. Otherwise negative.  Past Medical History  Diagnosis Date  . Diabetes mellitus   . GERD (gastroesophageal reflux disease)   . Gout   . Hyperlipidemia   . Hypertension   . Obesity   . CAD (coronary artery disease) 05/2013    NSTEMI:  LHC with mCFX 100% => 3.0 mm  x 20 mm Promus premier DES; EF 65%  . NSTEMI (non-ST elevated myocardial infarction) 05/2013   Past Surgical History  Procedure Laterality Date  . Biceps tendon repair  Nov 13 2010    right  . Hand surgery  1990    left  . Percutaneous coronary stent intervention (pci-s)  05/2013    mCx 3.50mm x 20 mm Promus P DES  . Left heart cath N/A 05/12/2013    Procedure: LEFT HEART CATH;  Surgeon: Lesleigh Noe, MD;  Location: [facility] CATH LAB;  Service: Cardiovascular;  Laterality: N/A;  . Percutaneous coronary stent intervention (pci-s)  05/12/2013    Procedure:  PERCUTANEOUS CORONARY STENT INTERVENTION (PCI-S);  Surgeon: Lesleigh Noe, MD;  Location: [facility] CATH LAB;  Service: Cardiovascular;;  . Left heart catheterization with coronary angiogram N/A 01/24/2014    Procedure: LEFT HEART CATHETERIZATION WITH CORONARY ANGIOGRAM;  Surgeon: Peter M Swaziland, MD;  Location: [facility] CATH LAB;  Service: Cardiovascular;  Laterality: N/A;   Social History:  reports that he quit smoking about 16 years ago. His smoking use included Cigarettes. He has never used smokeless tobacco. He reports that he drinks alcohol. He reports that he does not use illicit drugs.  Allergies  Allergen Reactions  . Metformin Other (See Comments)    Chest discomfort  . Prednisone     Mood swings    Family History  Problem Relation Age of Onset  . Heart attack Father     deceased  . Stroke Mother   . Diabetes Father   . Pancreatic cancer Maternal Aunt   . Colon cancer Neg Hx   . Heart failure Father   ]  Prior to Admission medications   Medication Sig Start Date End Date Taking? Authorizing Provider  allopurinol (ZYLOPRIM) 100 MG tablet Take 100 mg by mouth daily.   Yes Historical Provider, MD  aspirin 81 MG chewable tablet Chew 1 tablet (81 mg total) by mouth daily. 05/14/13  Yes Beatrice Lecher, PA-C  clopidogrel (PLAVIX) 75 MG tablet Take 75 mg by mouth daily. 03/15/15  Yes Historical  Provider, MD  colchicine 0.6 MG tablet Take 0.6 mg by mouth 2 (two) times daily. Then decrease to daily a symptoms allow   Yes Historical Provider, MD  CRESTOR 40 MG tablet Take 40 mg by mouth daily. 01/16/15  Yes Historical Provider, MD  dexlansoprazole (DEXILANT) 60 MG capsule Take 60 mg by mouth daily.   Yes Historical Provider, MD  indomethacin (INDOCIN) 25 MG capsule Take 25-50 mg by mouth 3 (three) times daily. 03/19/15  Yes Historical Provider, MD  insulin detemir (LEVEMIR) 100 UNIT/ML injection Inject 25-35 Units into the skin daily. 25 units every morning and 35 units at bedtime   Yes Historical  Provider, MD  losartan (COZAAR) 50 MG tablet Take 50 mg by mouth daily. 04/18/15  Yes Historical Provider, MD  nebivolol (BYSTOLIC) 5 MG tablet Take 5 mg by mouth daily.   Yes Historical Provider, MD  nitroGLYCERIN (NITROSTAT) 0.4 MG SL tablet Place 1 tablet (0.4 mg total) under the tongue every 5 (five) minutes as needed for chest pain. 05/14/13 04/23/15 Yes Scott T Weaver, PA-C  sitaGLIPtin (JANUVIA) 100 MG tablet Take 1 tablet (100 mg total) by mouth daily. 04/27/12  Yes Sheliah Hatch, MD  prasugrel (EFFIENT) 10 MG TABS tablet Take 1 tablet (10 mg total) by mouth daily. Patient not taking: Reported on 04/23/2015 01/24/14   Dwana Melena, PA-C   Physical Exam: Filed Vitals:   04/23/15 1700 04/23/15 1715 04/23/15 1730 04/23/15 1745  BP: 147/62 144/63 117/60 123/60  Pulse: 63 59 64 64  Temp:      TempSrc:      Resp: 14 14 11 22   Height:      Weight:      SpO2: 100% 100% 97% 98%    Wt Readings from Last 3 Encounters:  04/23/15 145.151 kg (320 lb)  01/24/14 149.642 kg (329 lb 14.4 oz)  05/12/13 142.883 kg (315 lb)    BP 116/66 mmHg  Pulse 68  Temp(Src) 98.5 F (36.9 C) (Oral)  Resp 15  Ht 6' (1.829 m)  Wt 142.928 kg (315 lb 1.6 oz)  BMI 42.73 kg/m2  SpO2 100%  General Appearance:    Alert, cooperative, no distress, appears stated age obese  Head:    Normocephalic, without obvious abnormality, atraumatic  Eyes:    PERRL, conjunctiva/corneas clear, EOM's intact  Nose:   Nares normal, septum midline, mucosa normal, no drainage   or sinus tenderness  Throat:   White plaques on OP and buccal mucosa  Neck:   Supple, symmetrical, trachea midline, no adenopathy;       thyroid:  No enlargement/tenderness/nodules; no carotid   bruit or JVD  Back:     Symmetric, no curvature, ROM normal, no CVA tenderness  Lungs:     Clear to auscultation bilaterally, respirations unlabored  Chest wall:    No tenderness or deformity  Heart:    Regular rate and rhythm, S1 and S2 normal, no murmur,  rub   or gallop  Abdomen:     Soft, non-tender, bowel sounds active obese  Genitalia:    deferred  Rectal:    deferred  Extremities:   Extremities normal, atraumatic, no cyanosis or edema  Pulses:   2+ and symmetric all extremities  Skin:   Skin color, texture, turgor normal, no rashes or lesions  Lymph nodes:   Cervical, supraclavicular, and axillary nodes normal  Neurologic:   CNII-XII intact. Normal strength, sensation and reflexes      throughout  Psych normal affect  Labs on Admission:  Basic Metabolic Panel:  Recent Labs Lab 04/23/15 1610  NA 135  K 3.7  CL 103  CO2 22  GLUCOSE 332*  BUN 12  CREATININE 0.89  CALCIUM 9.4   Liver Function Tests: No results for input(s): AST, ALT, ALKPHOS, BILITOT, PROT, ALBUMIN in the last 168 hours. No results for input(s): LIPASE, AMYLASE in the last 168 hours. No results for input(s): AMMONIA in the last 168 hours. CBC:  Recent Labs Lab 04/23/15 1610  WBC 7.8  NEUTROABS 5.2  HGB 12.9*  HCT 39.4  MCV 80.2  PLT 257   Cardiac Enzymes: No results for input(s): CKTOTAL, CKMB, CKMBINDEX, TROPONINI in the last 168 hours.  BNP (last 3 results)  Recent Labs  04/23/15 1610  BNP 30.8    ProBNP (last 3 results) No results for input(s): PROBNP in the last 8760 hours.  CBG: No results for input(s): GLUCAP in the last 168 hours.  Radiological Exams on Admission: Dg Chest Port 1 View  04/23/2015   CLINICAL DATA:  Acute chest pain and shortness of breath.  EXAM: PORTABLE CHEST - 1 VIEW  COMPARISON:  January 23, 2014.  FINDINGS: The heart size and mediastinal contours are within normal limits. Both lungs are clear. No pneumothorax or pleural effusion is noted. The visualized skeletal structures are unremarkable.  IMPRESSION: No acute cardiopulmonary abnormality seen.   Electronically Signed   By: Lupita Raider, M.D.   On: 04/23/2015 16:50    EKG: tracing reviewed. NSR Nonspecific ST and T wave  abnormality  Assessment/Plan Principal Problem:   Coronary artery disease with unstable angina pectoris continue heparin gtt and nitro gtt. SDU. Serial troponins. Dr. Preston Fleeting will consult Cardiology for recs. On asa plavix Active Problems:   Hyperlipidemia: resume statin   Essential hypertension: resume meds   GERD: bid PPI. Has been taking indomethacin and may be contributing to CP   DM (diabetes mellitus), type 2, uncontrolled: resume outpt meds. Add ssi and adjust. hgb aic   CAD (coronary artery disease) EDP consulted [facility]  Code Status: full Family Communication: wife Disposition Plan: obs sdu  Time spent: 40  Aaron Franco L [facility]   z

## 2015-04-23 NOTE — Progress Notes (Signed)
ANTICOAGULATION CONSULT NOTE  Pharmacy Consult for heparin  Indication: chest pain/ACS  Allergies  Allergen Reactions  . Metformin Other (See Comments)    Chest discomfort  . Prednisone     Mood swings    Patient Measurements: Height: 6' (182.9 cm) Weight: (!) 320 lb (145.151 kg) IBW/kg (Calculated) : 77.6 Heparin Dosing Weight: 111.4 kg  Vital Signs: Temp: 99.2 F (37.3 C) (06/13 1553) Temp Source: Oral (06/13 1553) BP: 145/71 mmHg (06/13 1554) Pulse Rate: 66 (06/13 1555)  Labs:  Recent Labs  04/23/15 1610  HGB 12.9*  HCT 39.4  PLT 257  CREATININE 0.89    Estimated Creatinine Clearance: 156.7 mL/min (by C-G formula based on Cr of 0.89).   Medical History: Past Medical History  Diagnosis Date  . Diabetes mellitus   . GERD (gastroesophageal reflux disease)   . Gout   . Hyperlipidemia   . Hypertension   . Obesity   . CAD (coronary artery disease) 05/2013    NSTEMI:  LHC with mCFX 100% => 3.0 mm  x 20 mm Promus premier DES; EF 65%  . NSTEMI (non-ST elevated myocardial infarction) 05/2013    Medications:   (Not in a hospital admission)  Assessment: 44 yo male admitted with chest pain and pressure. Pt has a history of NSTEMI with prior stent placement. Initiating heparin gtt for rule out ACS. hgb 12.9, plts wnl. Pt is on plavix PTA, no anticoagulants.   Goal of Therapy:  Heparin level 0.3-0.7 units/ml Monitor platelets by anticoagulation protocol: Yes    Plan: Heparin bolus 4000 units/hr x1 then 1300 units/hr Daily HL, CBC Check HL this evening Monitor for s/sx bleeding   Agapito Games, PharmD, BCPS Clinical Pharmacist Pager: [phone] 04/23/2015 4:44 PM

## 2015-04-23 NOTE — ED Notes (Signed)
Hospitalist at bedside 

## 2015-04-24 DIAGNOSIS — I2510 Atherosclerotic heart disease of native coronary artery without angina pectoris: Secondary | ICD-10-CM | POA: Diagnosis not present

## 2015-04-24 DIAGNOSIS — I10 Essential (primary) hypertension: Secondary | ICD-10-CM | POA: Diagnosis not present

## 2015-04-24 DIAGNOSIS — K219 Gastro-esophageal reflux disease without esophagitis: Secondary | ICD-10-CM

## 2015-04-24 DIAGNOSIS — R739 Hyperglycemia, unspecified: Secondary | ICD-10-CM

## 2015-04-24 DIAGNOSIS — E785 Hyperlipidemia, unspecified: Secondary | ICD-10-CM | POA: Diagnosis not present

## 2015-04-24 DIAGNOSIS — R079 Chest pain, unspecified: Secondary | ICD-10-CM | POA: Diagnosis not present

## 2015-04-24 LAB — GLUCOSE, CAPILLARY
GLUCOSE-CAPILLARY: 266 mg/dL — AB (ref 65–99)
Glucose-Capillary: 270 mg/dL — ABNORMAL HIGH (ref 65–99)

## 2015-04-24 LAB — HEMOGLOBIN A1C
HEMOGLOBIN A1C: 13.1 % — AB (ref 4.8–5.6)
Mean Plasma Glucose: 329 mg/dL

## 2015-04-24 LAB — CBC
HEMATOCRIT: 36.8 % — AB (ref 39.0–52.0)
HEMOGLOBIN: 12.3 g/dL — AB (ref 13.0–17.0)
MCH: 27.2 pg (ref 26.0–34.0)
MCHC: 33.4 g/dL (ref 30.0–36.0)
MCV: 81.2 fL (ref 78.0–100.0)
Platelets: 266 10*3/uL (ref 150–400)
RBC: 4.53 MIL/uL (ref 4.22–5.81)
RDW: 13.9 % (ref 11.5–15.5)
WBC: 8.8 10*3/uL (ref 4.0–10.5)

## 2015-04-24 LAB — TROPONIN I
Troponin I: <0.03 ng/mL (ref <0.031)
Troponin I: <0.03 ng/mL (ref <0.031)

## 2015-04-24 LAB — HEPARIN LEVEL (UNFRACTIONATED): Heparin Unfractionated: 0.42 IU/mL (ref 0.30–0.70)

## 2015-04-24 MED ORDER — GLUCOSE BLOOD VI STRP: ORAL_STRIP | Status: DC

## 2015-04-24 MED ORDER — FREESTYLE LANCETS MISC: Status: DC

## 2015-04-24 MED ORDER — FREESTYLE SYSTEM KIT: 1.0000 | PACK | Status: DC | PRN

## 2015-04-24 MED ORDER — INSULIN DETEMIR 100 UNIT/ML ~~LOC~~ SOLN: SUBCUTANEOUS | Status: DC

## 2015-04-24 MED ORDER — INSULIN DETEMIR 100 UNIT/ML ~~LOC~~ SOLN
35.0000 [IU] | Freq: Two times a day (BID) | SUBCUTANEOUS | Status: DC
Start: 2015-04-24 — End: 2015-04-24

## 2015-04-24 NOTE — Consult Note (Signed)
Admit date: 04/23/2015 Referring Physician  Dr. Susie Cassette Primary Physician Malka So., MD Primary Cardiologist  [facility] (last saw Dr. Herbie Baltimore here in hospital) Reason for Consultation  Chest pain  HPI: 44 year old male with morbid obesity weighing 315 pounds with coronary artery disease status post circumflex stent, DES 05/2013 in the setting of non-ST elevation myocardial infarction with repeat hospitalization on 01/24/14 secondary to unstable angina with cardiac catheterization by Dr. Swaziland at the time demonstrating nonobstructive coronary artery disease, patent stent with comorbidities of diabetes, hypertension, hyperlipidemia here with chest pain.  He described approximately 1 hour of chest discomfort, substernal, left-sided with no associated dyspnea, palpitations, diaphoresis which improved with 3 nitroglycerin's then eventually resolved with nitroglycerin jet. He was started on heparin IV in the emergency department. Troponins have been normal. Chest x-ray unremarkable. EKG shows no changes from prior. Ejection fraction previously normal. He had intolerance in the past to Brilinta and was switched to Effient. P2 white 12 assay indicated that he was a nonresponder to Plavix.  He had also been taking indomethacin recently for a gouty flareup as well as complaints of cookware/pans dropping on his foot at work.  Currently chest pain-free. His wife was present for discussion. Feels well this morning. He himself wonders if the indomethacin caused some of the discomfort.    PMH:   Past Medical History  Diagnosis Date  . Diabetes mellitus   . GERD (gastroesophageal reflux disease)   . Gout   . Hyperlipidemia   . Hypertension   . Obesity   . CAD (coronary artery disease) 05/2013    NSTEMI:  LHC with mCFX 100% => 3.0 mm  x 20 mm Promus premier DES; EF 65%  . NSTEMI (non-ST elevated myocardial infarction) 05/2013    PSH:   Past Surgical History  Procedure Laterality Date  . Biceps  tendon repair  Nov 13 2010    right  . Hand surgery  1990    left  . Percutaneous coronary stent intervention (pci-s)  05/2013    mCx 3.5mm x 20 mm Promus P DES  . Left heart cath N/A 05/12/2013    Procedure: LEFT HEART CATH;  Surgeon: Lesleigh Noe, MD;  Location: [facility] CATH LAB;  Service: Cardiovascular;  Laterality: N/A;  . Percutaneous coronary stent intervention (pci-s)  05/12/2013    Procedure: PERCUTANEOUS CORONARY STENT INTERVENTION (PCI-S);  Surgeon: Lesleigh Noe, MD;  Location: [facility] CATH LAB;  Service: Cardiovascular;;  . Left heart catheterization with coronary angiogram N/A 01/24/2014    Procedure: LEFT HEART CATHETERIZATION WITH CORONARY ANGIOGRAM;  Surgeon: Peter M Swaziland, MD;  Location: [facility] CATH LAB;  Service: Cardiovascular;  Laterality: N/A;   Allergies:  Metformin and Prednisone Prior to Admit Meds:   Prior to Admission medications   Medication Sig Start Date End Date Taking? Authorizing Provider  allopurinol (ZYLOPRIM) 100 MG tablet Take 100 mg by mouth daily.   Yes Historical Provider, MD  aspirin 81 MG chewable tablet Chew 1 tablet (81 mg total) by mouth daily. 05/14/13  Yes Beatrice Lecher, PA-C  clopidogrel (PLAVIX) 75 MG tablet Take 75 mg by mouth daily. 03/15/15  Yes Historical Provider, MD  colchicine 0.6 MG tablet Take 0.6 mg by mouth 2 (two) times daily. Then decrease to daily a symptoms allow   Yes Historical Provider, MD  CRESTOR 40 MG tablet Take 40 mg by mouth daily. 01/16/15  Yes Historical Provider, MD  dexlansoprazole (DEXILANT) 60 MG capsule Take 60 mg by mouth  daily.   Yes Historical Provider, MD  indomethacin (INDOCIN) 25 MG capsule Take 25-50 mg by mouth 3 (three) times daily. 03/19/15  Yes Historical Provider, MD  insulin detemir (LEVEMIR) 100 UNIT/ML injection Inject 25-35 Units into the skin daily. 25 units every morning and 35 units at bedtime   Yes Historical Provider, MD  losartan (COZAAR) 50 MG tablet Take 50 mg by mouth daily. 04/18/15  Yes Historical  Provider, MD  nebivolol (BYSTOLIC) 5 MG tablet Take 5 mg by mouth daily.   Yes Historical Provider, MD  nitroGLYCERIN (NITROSTAT) 0.4 MG SL tablet Place 1 tablet (0.4 mg total) under the tongue every 5 (five) minutes as needed for chest pain. 05/14/13 04/23/15 Yes Scott T Weaver, PA-C  sitaGLIPtin (JANUVIA) 100 MG tablet Take 1 tablet (100 mg total) by mouth daily. 04/27/12  Yes Sheliah Hatch, MD  prasugrel (EFFIENT) 10 MG TABS tablet Take 1 tablet (10 mg total) by mouth daily. Patient not taking: Reported on 04/23/2015 01/24/14   Dwana Melena, PA-C   Fam HX:    Family History  Problem Relation Age of Onset  . Heart attack Father     deceased  . Stroke Mother   . Diabetes Father   . Pancreatic cancer Maternal Aunt   . Colon cancer Neg Hx   . Heart failure Father    Social HX:    History   Social History  . Marital Status: Married    Spouse Name: N/A  . Number of Children: 2  . Years of Education: N/A   Occupational History  . material handler [employer]   Social History Main Topics  . Smoking status: Former Smoker    Types: Cigarettes    Quit date: 11/10/1998  . Smokeless tobacco: Never Used     Comment: pt only smoked 1-2 cigs a day, not everyday on occs x 2 years  . Alcohol Use: Yes     Comment: rare  . Drug Use: No  . Sexual Activity: Not on file   Other Topics Concern  . Not on file   Social History Narrative     ROS:  No bleeding, no seen to be, no orthopnea, no PND. Has had his big toe laceration in the past, 8 stitches, dropped a pot on it. All 11 ROS were addressed and are negative except what is stated in the HPI   Physical Exam: Blood pressure 91/50, pulse 74, temperature 97.7 F (36.5 C), temperature source Oral, resp. rate 19, height 6' (1.829 m), weight 315 lb 14.4 oz (143.291 kg), SpO2 100 %.   General: Well developed, well nourished, in no acute distress Head: Eyes PERRLA, No xanthomas.   Normal cephalic and atramatic  Lungs:   Clear bilaterally to  auscultation and percussion. Normal respiratory effort. No wheezes, no rales. Heart:   HRRR S1 S2 Pulses are 2+ & equal. No murmur, rubs, gallops.  No carotid bruit. No JVD.  No abdominal bruits.  Abdomen: Bowel sounds are positive, abdomen soft and non-tender without masses. No hepatosplenomegaly. Obese Msk:  Back normal. Normal strength and tone for age. Extremities:  No clubbing, cyanosis or edema.  DP +1 Neuro: Alert and oriented X 3, non-focal, MAE x 4 GU: Deferred Rectal: Deferred Psych:  Good affect, responds appropriately      Labs: Lab Results  Component Value Date   WBC 8.8 04/24/2015   HGB 12.3* 04/24/2015   HCT 36.8* 04/24/2015   MCV 81.2 04/24/2015   PLT 266 04/24/2015  Recent Labs Lab 04/23/15 1610  NA 135  K 3.7  CL 103  CO2 22  BUN 12  CREATININE 0.89  CALCIUM 9.4  GLUCOSE 332*    Recent Labs  04/23/15 1900 04/24/15 0015 04/24/15 0611  TROPONINI <0.03 <0.03 <0.03   Lab Results  Component Value Date   CHOL 202* 01/24/2014   HDL 28* 01/24/2014   LDLCALC 143* 01/24/2014   TRIG 157* 01/24/2014   No results found for: DDIMER   Radiology:  Dg Chest Port 1 View  04/23/2015   CLINICAL DATA:  Acute chest pain and shortness of breath.  EXAM: PORTABLE CHEST - 1 VIEW  COMPARISON:  January 23, 2014.  FINDINGS: The heart size and mediastinal contours are within normal limits. Both lungs are clear. No pneumothorax or pleural effusion is noted. The visualized skeletal structures are unremarkable.  IMPRESSION: No acute cardiopulmonary abnormality seen.   Electronically Signed   By: Lupita Raider, M.D.   On: 04/23/2015 16:50   Personally viewed.  EKG: 04/23/15-sinus rhythm with nonspecific ST-T wave changes , no change from prior EKG 1 year ago. Personally viewed.   ASSESSMENT/PLAN:    44 year old male with morbid obesity, coronary artery disease, diabetes, hypertension, hyperlipidemia who presented with one hour of chest discomfort resolved with  nitroglycerin, currently chest pain-free with no elevation in cardiac biomarker troponin, no change in EKG from previous, cardiac catheterization approximately one year ago showing nonobstructive CAD with patent circumflex stent placed in 2014.  1. Chest pain-currently reassuring EKG, troponins as noted above. Chest pain-free currently. Given his recent cardiac catheterization, I would opt for medical management at this point. It would not be unreasonable for him to continue with his dual antiplatelet therapy however he is greater than 1 year post drug eluting stent placement and aspirin only would be reasonable. Given his weight, utility of stress test would be decreased due to decreased sensitivity. I'm fine with heparin and nitroglycerin being discontinued.  2. Diabetes mellitus-per primary team. Blood sugar was 300 on arrival. Continue to encourage compliance, weight loss.  3. Essential hypertension-pressure low this morning. Could be from nitroglycerin. Medications reviewed.  4. Morbid obesity-continue to encourage weight loss. Discussed low carbohydrate diet.  5. Gout-recent indomethacin use-foot pain. Certainly his chest discomfort could be related to GERD/esophagitis or gastritis.  I'm fine with him being discharged. He will be following up with Dr. Heron Nay in [place]. Consider stopping dual antiplatelet therapy perhaps in one month. Continue aspirin lifelong.  Donato Schultz, MD  04/24/2015  8:01 AM

## 2015-04-24 NOTE — Progress Notes (Signed)
Rounding Note:   Spoke with patient and wife about diabetes and home regimen for diabetes control. Patient reports that he is followed by his PCP (Dr. Allena Katz with [facility]) for diabetes management. Patient reports that he is due for a follow up with his PCP. Inquired about knowledge about A1C and patient reports that he does know what an A1C is. Discussed A1C results (13.1% on 04/23/15) and basic home care, importance of checking CBGs and maintaining good CBG control to prevent long-term and short-term complications. Discussed effects of high glucose levels on the body systems esp. Cardiovascular system.  Patient reports needing a glucose meter at discharge. Discussed impact of nutrition, exercise, stress, sickness, and medications on diabetes control.  Patient reports drinking a regular soda for lunch every day at work and eats 2 [place] sandwiches but drinks water at home. Discussed carbohydrates, carbohydrate goals per day and meal, along with portion sizes. Gave patient diabetes meal planning guide and discussed specific foods for healthier options to eat. Patient reports that he is confident he can modify his diet and beverages. Patient verbalized understanding of information discussed and he states that he has no further questions at this time related to diabetes.   Thanks,  Christena Deem RN, MSN, [facility] Inpatient Diabetes Coordinator Team Pager [phone]

## 2015-04-24 NOTE — Discharge Instructions (Signed)

## 2015-04-24 NOTE — Discharge Summary (Signed)
Physician Discharge Summary  Aaron Franco MRN: [id number] DOB/AGE: Dec 22, 1970 44 y.o.  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 Darral Dash in [facility]. Consider stopping dual antiplatelet therapy perhaps in one month. Continue aspirin lifelong.     Medication List    STOP taking these medications        nitroGLYCERIN 0.4 MG SL tablet  Commonly known as:  NITROSTAT     prasugrel 10 MG Tabs tablet  Commonly known as:  EFFIENT      TAKE these medications        allopurinol 100 MG tablet  Commonly known as:  ZYLOPRIM  Take 100 mg by mouth daily.     aspirin 81 MG chewable tablet  Chew 1 tablet (81 mg total) by mouth daily.     clopidogrel 75 MG tablet  Commonly known as:  PLAVIX  Take 75 mg by mouth daily.     colchicine 0.6 MG tablet  Take 0.6 mg by mouth 2 (two) times daily. Then decrease to daily a symptoms allow     CRESTOR 40 MG tablet  Generic drug:  rosuvastatin  Take 40 mg by mouth daily.     DEXILANT 60 MG capsule  Generic drug:  dexlansoprazole  Take 60 mg by mouth daily.     glucose monitoring kit monitoring kit  1 each by Does not apply route as needed for other.     indomethacin 25 MG capsule  Commonly known as:  INDOCIN  Take 25-50 mg by mouth 3 (three) times daily.     insulin detemir 100 UNIT/ML injection  Commonly known as:  LEVEMIR  35 units in the morning and 40 units in the evening     losartan 50 MG tablet  Commonly known as:  COZAAR  Take 50 mg by mouth daily.      nebivolol 5 MG tablet  Commonly known as:  BYSTOLIC  Take 5 mg by mouth daily.     sitaGLIPtin 100 MG tablet  Commonly known as:  JANUVIA  Take 1 tablet (100 mg total) by mouth daily.         Discharge Condition: Stable  Disposition: 01-Home or Self Care   Consults: Cardiology   Significant Diagnostic Studies:  Dg Chest Port 1 View  04/23/2015   CLINICAL DATA:  Acute chest pain and shortness of breath.  EXAM: PORTABLE CHEST - 1 VIEW  COMPARISON:  January 23, 2014.  FINDINGS: The heart size and mediastinal contours are within normal limits. Both lungs are clear. No pneumothorax or pleural effusion is noted. The visualized skeletal structures are unremarkable.  IMPRESSION: No acute cardiopulmonary abnormality seen.   Electronically Signed   By: Marijo Conception, M.D.   On: 04/23/2015 16:50        Filed Weights   04/23/15 1601 04/23/15 1822 04/24/15 0500  Weight: 145.151 kg (320 lb) 142.928 kg (315 lb 1.6 oz) 143.291 kg (315 lb 14.4 oz)     Microbiology: Recent Results (from the past 240 hour(s))  MRSA PCR Screening     Status: None   Collection Time: 04/23/15  6:44 PM  Result Value Ref Range Status   MRSA by PCR NEGATIVE NEGATIVE Final    Comment:        The GeneXpert MRSA Assay (FDA approved for NASAL specimens only), is one component of a comprehensive MRSA colonization surveillance program. It is not intended to diagnose MRSA infection nor to guide or monitor treatment for MRSA infections.        Blood Culture No results found for: SDES, SPECREQUEST, CULT, REPTSTATUS    Labs: Results for orders placed or performed during the hospital encounter of 04/23/15 (from the past 48 hour(s))  CBC     Status: Abnormal   Collection Time: 04/23/15  4:10 PM  Result Value Ref Range   WBC 7.8 4.0 - 10.5 K/uL   RBC 4.91 4.22 - 5.81 MIL/uL   Hemoglobin 12.9 (L) 13.0 - 17.0 g/dL   HCT 39.4 39.0 - 52.0 %   MCV 80.2 78.0 - 100.0 fL   MCH 26.3 26.0 - 34.0 pg    MCHC 32.7 30.0 - 36.0 g/dL   RDW 13.4 11.5 - 15.5 %   Platelets 257 150 - 400 K/uL  Basic metabolic panel     Status: Abnormal   Collection Time: 04/23/15  4:10 PM  Result Value Ref Range   Sodium 135 135 - 145 mmol/L   Potassium 3.7 3.5 - 5.1 mmol/L   Chloride 103 101 - 111 mmol/L   CO2 22 22 - 32 mmol/L   Glucose, Bld 332 (H) 65 - 99 mg/dL   BUN 12 6 - 20 mg/dL   Creatinine, Ser 0.89 0.61 - 1.24 mg/dL   Calcium 9.4 8.9 - 10.3 mg/dL   GFR calc non Af Amer >60 >60 mL/min   GFR calc Af Amer >60 >60 mL/min    Comment: (NOTE) The eGFR has been calculated using the CKD EPI equation. This calculation has not been validated in all clinical situations. eGFR's persistently <60 mL/min signify possible Chronic Kidney Disease.    Anion gap 10 5 - 15  BNP (order ONLY if patient complains of dyspnea/SOB AND you have documented it for THIS visit)     Status: None   Collection Time: 04/23/15  4:10 PM  Result Value Ref Range   B Natriuretic Peptide 30.8 0.0 - 100.0 pg/mL  Differential     Status: None   Collection Time: 04/23/15  4:10 PM  Result Value Ref Range   Neutrophils Relative % 68 43 - 77 %   Neutro Abs 5.2 1.7 - 7.7 K/uL   Lymphocytes Relative 27 12 - 46 %   Lymphs Abs 2.1 0.7 - 4.0 K/uL   Monocytes Relative 5 3 - 12 %   Monocytes Absolute 0.4 0.1 - 1.0 K/uL   Eosinophils Relative 0 0 - 5 %   Eosinophils Absolute 0.0 0.0 - 0.7 K/uL   Basophils Relative 0 0 - 1 %   Basophils Absolute 0.0 0.0 - 0.1 K/uL  I-stat troponin, ED  (not at [facility])     Status: None   Collection Time: 04/23/15  4:53 PM  Result Value Ref Range   Troponin i, poc 0.00 0.00 - 0.08 ng/mL   Comment 3            Comment: Due to the release kinetics of cTnI, a negative result within the first hours of the onset of symptoms does not rule out myocardial  infarction with certainty. If myocardial infarction is still suspected, repeat the test at appropriate intervals.   MRSA PCR Screening     Status: None    Collection Time: 04/23/15  6:44 PM  Result Value Ref Range   MRSA by PCR NEGATIVE NEGATIVE    Comment:        The GeneXpert MRSA Assay (FDA approved for NASAL specimens only), is one component of a comprehensive MRSA colonization surveillance program. It is not intended to diagnose MRSA infection nor to guide or monitor treatment for MRSA infections.   Glucose, capillary     Status: Abnormal   Collection Time: 04/23/15  6:48 PM  Result Value Ref Range   Glucose-Capillary 270 (H) 65 - 99 mg/dL  Hemoglobin A1c     Status: Abnormal   Collection Time: 04/23/15  7:00 PM  Result Value Ref Range   Hgb A1c MFr Bld 13.1 (H) 4.8 - 5.6 %    Comment: (NOTE)         Pre-diabetes: 5.7 - 6.4         Diabetes: >6.4         Glycemic control for adults with diabetes: <7.0    Mean Plasma Glucose 329 mg/dL    Comment: (NOTE) Performed At: [facility] [address] [place], [place] [id number] Lindon Romp MD [id number]   Troponin I     Status: None   Collection Time: 04/23/15  7:00 PM  Result Value Ref Range   Troponin I <0.03 <0.031 ng/mL    Comment:        NO INDICATION OF MYOCARDIAL INJURY.   Glucose, capillary     Status: Abnormal   Collection Time: 04/23/15 10:23 PM  Result Value Ref Range   Glucose-Capillary 223 (H) 65 - 99 mg/dL  Heparin level (unfractionated)     Status: Abnormal   Collection Time: 04/23/15 11:00 PM  Result Value Ref Range   Heparin Unfractionated 0.16 (L) 0.30 - 0.70 IU/mL    Comment:        IF HEPARIN RESULTS ARE BELOW EXPECTED VALUES, AND PATIENT DOSAGE HAS BEEN CONFIRMED, SUGGEST FOLLOW UP TESTING OF ANTITHROMBIN III LEVELS.   Troponin I     Status: None   Collection Time: 04/24/15 12:15 AM  Result Value Ref Range   Troponin I <0.03 <0.031 ng/mL    Comment:        NO INDICATION OF MYOCARDIAL INJURY.   Troponin I     Status: None   Collection Time: 04/24/15  6:11 AM  Result Value Ref Range   Troponin I <0.03 <0.031 ng/mL     Comment:        NO INDICATION OF MYOCARDIAL INJURY.   CBC     Status: Abnormal   Collection Time: 04/24/15  6:11 AM  Result Value Ref Range   WBC 8.8 4.0 - 10.5 K/uL   RBC 4.53 4.22 - 5.81 MIL/uL   Hemoglobin 12.3 (L) 13.0 - 17.0 g/dL   HCT 36.8 (L) 39.0 - 52.0 %   MCV 81.2 78.0 - 100.0 fL   MCH 27.2 26.0 - 34.0 pg   MCHC 33.4 30.0 - 36.0 g/dL   RDW 13.9 11.5 - 15.5 %   Platelets 266 150 - 400 K/uL  Heparin level (unfractionated)     Status: None   Collection Time: 04/24/15  6:11 AM  Result Value Ref Range   Heparin Unfractionated 0.42 0.30 - 0.70 IU/mL    Comment:  IF HEPARIN RESULTS ARE BELOW EXPECTED VALUES, AND PATIENT DOSAGE HAS BEEN CONFIRMED, SUGGEST FOLLOW UP TESTING OF ANTITHROMBIN III LEVELS.   Glucose, capillary     Status: Abnormal   Collection Time: 04/24/15  7:35 AM  Result Value Ref Range   Glucose-Capillary 270 (H) 65 - 99 mg/dL   Comment 1 Notify RN    Comment 2 Document in Chart      Lipid Panel     Component Value Date/Time   CHOL 202* 01/24/2014 0456   TRIG 157* 01/24/2014 0456   HDL 28* 01/24/2014 0456   CHOLHDL 7.2 01/24/2014 0456   VLDL 31 01/24/2014 0456   LDLCALC 143* 01/24/2014 0456   LDLDIRECT 99.7 07/19/2010 1017     Lab Results  Component Value Date   HGBA1C 13.1* 04/23/2015   HGBA1C 9.9* 01/24/2014   HGBA1C 7.2* 04/28/2011     Lab Results  Component Value Date   MICROALBUR 2.13* 11/23/2009   LDLCALC 143* 01/24/2014   CREATININE 0.89 04/23/2015     HPI :44 year old male with morbid obesity weighing 315 pounds with coronary artery disease status post circumflex stent, DES 05/2013 in the setting of non-ST elevation myocardial infarction with repeat hospitalization on 01/24/14 secondary to unstable angina with cardiac catheterization by Dr. Martinique at the time demonstrating nonobstructive coronary artery disease, patent stent with comorbidities of diabetes, hypertension, hyperlipidemia here with chest pain.  He described  approximately 1 hour of chest discomfort, substernal, left-sided with no associated dyspnea, palpitations, diaphoresis which improved with 3 nitroglycerin's then eventually resolved with nitroglycerin jet. He was started on heparin IV in the emergency department. Troponins have been normal. Chest x-ray unremarkable. EKG shows no changes from prior. Ejection fraction previously normal. He had intolerance in the past to Brilinta and was switched to Effient. P2 white 12 assay indicated that he was a nonresponder to Plavix.  He had also been taking indomethacin recently for a gouty flareup as well as complaints of cookware/pans dropping on his foot at work.  Currently chest pain-free. His wife was present for discussion. Feels well this morning. He himself wonders if the indomethacin caused some of the discomfort.    HOSPITAL COURSE:  44 year old male with morbid obesity, coronary artery disease, diabetes, hypertension, hyperlipidemia who presented with one hour of chest discomfort resolved with nitroglycerin, currently chest pain-free with no elevation in cardiac biomarker troponin, no change in EKG from previous, cardiac catheterization approximately one year ago showing nonobstructive CAD with patent circumflex stent placed in 2014.  1. Chest pain-currently reassuring EKG, troponins as noted above. Chest pain-free currently. Given his recent cardiac catheterization, I would opt for medical management at this point. It would not be unreasonable for him to continue with his dual antiplatelet therapy however he is greater than 1 year post drug eluting stent placement and aspirin only would be reasonable. Given his weight, utility of stress test would be decreased due to decreased sensitivity.    2. Diabetes mellitus-extremely uncontrolled, hemoglobin A1c 13.1. Blood sugar was 300 on arrival. Patient does not monitor his sugar at home. Levemir increased to 35 units and 40 units respectively in a.m. and p.m.  patient provided with a glucometer  3. Essential hypertension-pressure low this morning. Could be from nitroglycerin. Medications reviewed.  4. Morbid obesity-continue to encourage weight loss. Discussed low carbohydrate diet.  5. Gout-recent indomethacin use-foot pain. Certainly his chest discomfort could be related to GERD/esophagitis or gastritis.  Cardiology recommended the patient to be discharged. He will be following up  with Dr. Darral Dash in [place]. Consider stopping dual antiplatelet therapy perhaps in one month. Continue aspirin lifelong.    Discharge Exam:    Blood pressure 128/75, pulse 67, temperature 97.8 F (36.6 C), temperature source Oral, resp. rate 18, height 6' (1.829 m), weight 143.291 kg (315 lb 14.4 oz), SpO2 100 %.  General: Well developed, well nourished, in no acute distress Head: Eyes PERRLA, No xanthomas. Normal cephalic and atramatic Lungs: Clear bilaterally to auscultation and percussion. Normal respiratory effort. No wheezes, no rales. Heart: HRRR S1 S2 Pulses are 2+ & equal. No murmur, rubs, gallops. No carotid bruit. No JVD. No abdominal bruits.  Abdomen: Bowel sounds are positive, abdomen soft and non-tender without masses. No hepatosplenomegaly. Obese Msk: Back normal. Normal strength and tone for age. Extremities: No clubbing, cyanosis or edema. DP +1 Neuro: Alert and oriented X 3, non-focal, MAE x 4 GU: Deferred Rectal: Deferred Psych: Good affect, responds appropriately       Discharge Instructions    Diet - low sodium heart healthy    Complete by:  As directed      Increase activity slowly    Complete by:  As directed            Follow-up Information    Follow up with JOBE,DANIEL B., MD. Schedule an appointment as soon as possible for a visit in 3 days.   Specialty:  Internal Medicine   Contact information:   [place] [id number] [phone]       Follow up with Dr Darral Dash. Schedule  an appointment as soon as possible for a visit in 1 week.   Contact information:   cardiology , [place]       Signed: ABROL,NAYANA 04/24/2015, 11:14 AM        Time spent >45 mins

## 2015-09-17 ENCOUNTER — Emergency Department (HOSPITAL_BASED_OUTPATIENT_CLINIC_OR_DEPARTMENT_OTHER): Payer: BC Managed Care – PPO

## 2015-09-17 ENCOUNTER — Encounter (HOSPITAL_BASED_OUTPATIENT_CLINIC_OR_DEPARTMENT_OTHER): Payer: Self-pay | Admitting: *Deleted

## 2015-09-17 ENCOUNTER — Observation Stay (HOSPITAL_BASED_OUTPATIENT_CLINIC_OR_DEPARTMENT_OTHER)
Admission: EM | Admit: 2015-09-17 | Discharge: 2015-09-18 | Disposition: A | Discharge status: Home / Self Care | Payer: BC Managed Care – PPO | Attending: Internal Medicine | Admitting: Internal Medicine

## 2015-09-17 DIAGNOSIS — I10 Essential (primary) hypertension: Secondary | ICD-10-CM | POA: Diagnosis not present

## 2015-09-17 DIAGNOSIS — R079 Chest pain, unspecified: Secondary | ICD-10-CM | POA: Diagnosis present

## 2015-09-17 DIAGNOSIS — K219 Gastro-esophageal reflux disease without esophagitis: Secondary | ICD-10-CM | POA: Diagnosis present

## 2015-09-17 DIAGNOSIS — E119 Type 2 diabetes mellitus without complications: Secondary | ICD-10-CM | POA: Diagnosis present

## 2015-09-17 DIAGNOSIS — I2510 Atherosclerotic heart disease of native coronary artery without angina pectoris: Secondary | ICD-10-CM

## 2015-09-17 DIAGNOSIS — R0789 Other chest pain: Secondary | ICD-10-CM

## 2015-09-17 DIAGNOSIS — E785 Hyperlipidemia, unspecified: Secondary | ICD-10-CM | POA: Insufficient documentation

## 2015-09-17 DIAGNOSIS — E669 Obesity, unspecified: Secondary | ICD-10-CM

## 2015-09-17 DIAGNOSIS — E1165 Type 2 diabetes mellitus with hyperglycemia: Secondary | ICD-10-CM | POA: Diagnosis not present

## 2015-09-17 DIAGNOSIS — M109 Gout, unspecified: Secondary | ICD-10-CM | POA: Diagnosis present

## 2015-09-17 DIAGNOSIS — Z9861 Coronary angioplasty status: Secondary | ICD-10-CM | POA: Insufficient documentation

## 2015-09-17 DIAGNOSIS — E118 Type 2 diabetes mellitus with unspecified complications: Secondary | ICD-10-CM | POA: Diagnosis not present

## 2015-09-17 DIAGNOSIS — E1140 Type 2 diabetes mellitus with diabetic neuropathy, unspecified: Secondary | ICD-10-CM | POA: Diagnosis present

## 2015-09-17 DIAGNOSIS — Z794 Long term (current) use of insulin: Secondary | ICD-10-CM

## 2015-09-17 HISTORY — DX: Essential (primary) hypertension: I10

## 2015-09-17 HISTORY — DX: Personal history of other medical treatment: Z92.89

## 2015-09-17 LAB — CBC
HEMATOCRIT: 37.7 % — AB (ref 39.0–52.0)
HEMOGLOBIN: 12.6 g/dL — AB (ref 13.0–17.0)
MCH: 26.5 pg (ref 26.0–34.0)
MCHC: 33.4 g/dL (ref 30.0–36.0)
MCV: 79.4 fL (ref 78.0–100.0)
Platelets: 268 10*3/uL (ref 150–400)
RBC: 4.75 MIL/uL (ref 4.22–5.81)
RDW: 13.6 % (ref 11.5–15.5)
WBC: 9.5 10*3/uL (ref 4.0–10.5)

## 2015-09-17 LAB — BASIC METABOLIC PANEL
ANION GAP: 11 (ref 5–15)
BUN: 12 mg/dL (ref 6–20)
CHLORIDE: 99 mmol/L — AB (ref 101–111)
CO2: 24 mmol/L (ref 22–32)
Calcium: 9.4 mg/dL (ref 8.9–10.3)
Creatinine, Ser: 0.75 mg/dL (ref 0.61–1.24)
Glucose, Bld: 251 mg/dL — ABNORMAL HIGH (ref 65–99)
POTASSIUM: 3.7 mmol/L (ref 3.5–5.1)
SODIUM: 134 mmol/L — AB (ref 135–145)

## 2015-09-17 LAB — TROPONIN I: Troponin I: <0.03 ng/mL (ref <0.031)

## 2015-09-17 MED ORDER — ASPIRIN 81 MG PO CHEW
324.0000 mg | CHEWABLE_TABLET | Freq: Once | ORAL | Status: DC
  Filled 2015-09-17: qty 4

## 2015-09-17 MED ORDER — ASPIRIN 81 MG PO CHEW
243.0000 mg | CHEWABLE_TABLET | Freq: Once | ORAL | Status: AC
  Administered 2015-09-17: 243 mg via ORAL

## 2015-09-17 NOTE — ED Notes (Signed)
Chest pain for an hour. He took ASA and a Nitro with some relief. Nausea and lightheaded. Hx MI

## 2015-09-17 NOTE — ED Notes (Signed)
Report given to Alexis,RN at this time 3w3 [place].

## 2015-09-17 NOTE — H&P (Cosign Needed)
[facility] History and Physical  Aaron Franco CNO:[id number] DOB: 07/21/1971 DOA: 09/17/2015  Referring physician: ED PCP: Lilian Coma., MD   Chief Complaint:  Chest pain  HPI:  44 year old male with a past medical history significant for CAD s/p stent 2014 DES to Cx, HLD, HTN, diabetes mellitus type 2 uncontrolled last hbgA1c 13.1 on 04/23/2015, and gout; who presents with chest pain. Just prior to chest pain which started this afternoon he had taken his indomethacin on an empty stomach. He reports right sided chest tightness which felt like gas pressure. Rates pain at 8 out of 10. Reported associated symptoms of nausea and shortness of breath. He reports that symptoms lasted approximately 1 hour and resolved after taking a baby aspirin and 2 sublingual nitroglycerin tablets. He reports previous symptoms like this in June 2016 where he reports again taking indomethacin on empty stomach. Patient reports that he's been taking indomethacin daily along with allopurinol for history of gout. Currently has not experiencing any acute gout flare symptoms. He also notes that he's been feeling bloated and  Troponin normal, CXR within normal limits, CBG greater than 200. Cath last year showed Nonobstructive CAD ECG today showing nonspecific, and the patient states no recurrence of chest pain symptoms. Primary Cards at [facility]        Review of Systems  Constitutional: Negative for chills, weight loss and diaphoresis.  HENT: Negative for ear pain.   Eyes: Negative for double vision and photophobia.  Respiratory: Positive for shortness of breath. Negative for sputum production and wheezing.   Cardiovascular: Positive for chest pain. Negative for orthopnea.  Gastrointestinal: Positive for nausea. Negative for vomiting.  Genitourinary: Negative for dysuria and frequency.  Musculoskeletal: Negative for myalgias and neck pain.  Skin: Negative for itching and rash.  Neurological:  Negative for tremors, focal weakness and headaches.  Endo/Heme/Allergies: Negative for polydipsia. Does not bruise/bleed easily.  Psychiatric/Behavioral: Negative for suicidal ideas and substance abuse.         Past Medical History  Diagnosis Date  . Diabetes mellitus   . GERD (gastroesophageal reflux disease)   . Gout   . Hyperlipidemia   . Hypertension   . Obesity   . CAD (coronary artery disease) 05/2013    NSTEMI:  LHC with mCFX 100% => 3.0 mm  x 20 mm Promus premier DES; EF 65%  . NSTEMI (non-ST elevated myocardial infarction) ([place]) 05/2013     Past Surgical History  Procedure Laterality Date  . Biceps tendon repair  Nov 13 2010    right  . Hand surgery  1990    left  . Percutaneous coronary stent intervention (pci-s)  05/2013    mCx 3.37m x 20 mm Promus P DES  . Left heart cath N/A 05/12/2013    Procedure: LEFT HEART CATH;  Surgeon: HSinclair Grooms MD;  [facility];  Service: Cardiovascular;  Laterality: N/A;  . Percutaneous coronary stent intervention (pci-s)  05/12/2013    Procedure: PERCUTANEOUS CORONARY STENT INTERVENTION (PCI-S);  Surgeon: HSinclair Grooms MD;  [facility];  Service: Cardiovascular;;  . Left heart catheterization with coronary angiogram N/A 01/24/2014    Procedure: LEFT HEART CATHETERIZATION WITH CORONARY ANGIOGRAM;  Surgeon: Peter M JMartinique MD;  [facility];  Service: Cardiovascular;  Laterality: N/A;      Social History:  reports that he quit smoking about 16 years ago. His smoking use included Cigarettes. He has never used smokeless tobacco. He reports that he drinks  alcohol. He reports that he does not use illicit drugs. Where does patient live--home  with whom if at home? Wife Can patient participate in ADLs? Yes  Allergies  Allergen Reactions  . Metformin Other (See Comments)    Chest discomfort  . Prednisone     Mood swings    Family History  Problem Relation Age of Onset  . Heart attack Father      deceased  . Stroke Mother   . Diabetes Father   . Pancreatic cancer Maternal Aunt   . Colon cancer Neg Hx   . Heart failure Father       Prior to Admission medications   Medication Sig Start Date End Date Taking? Authorizing Provider  allopurinol (ZYLOPRIM) 100 MG tablet Take 100 mg by mouth daily.    Historical Provider, MD  aspirin 81 MG chewable tablet Chew 1 tablet (81 mg total) by mouth daily. 05/14/13   Liliane Shi, PA-C  clopidogrel (PLAVIX) 75 MG tablet Take 75 mg by mouth daily. 03/15/15   Historical Provider, MD  colchicine 0.6 MG tablet Take 0.6 mg by mouth 2 (two) times daily. Then decrease to daily a symptoms allow    Historical Provider, MD  CRESTOR 40 MG tablet Take 40 mg by mouth daily. 01/16/15   Historical Provider, MD  dexlansoprazole (DEXILANT) 60 MG capsule Take 60 mg by mouth daily.    Historical Provider, MD  glucose blood test strip Use as instructed 04/24/15   Reyne Dumas, MD  glucose monitoring kit (FREESTYLE) monitoring kit 1 each by Does not apply route as needed for other. 04/24/15   Reyne Dumas, MD  indomethacin (INDOCIN) 25 MG capsule Take 25-50 mg by mouth 3 (three) times daily. 03/19/15   Historical Provider, MD  insulin detemir (LEVEMIR) 100 UNIT/ML injection 35 units in the morning and 40 units in the evening 04/24/15   Reyne Dumas, MD  Lancets (FREESTYLE) lancets TID , q [facility] 04/24/15   Reyne Dumas, MD  losartan (COZAAR) 50 MG tablet Take 50 mg by mouth daily. 04/18/15   Historical Provider, MD  nebivolol (BYSTOLIC) 5 MG tablet Take 5 mg by mouth daily.    Historical Provider, MD  sitaGLIPtin (JANUVIA) 100 MG tablet Take 1 tablet (100 mg total) by mouth daily. 04/27/12   Midge Minium, MD     Physical Exam: Filed Vitals:   09/17/15 2014 09/17/15 2100 09/17/15 2200 09/17/15 2222  BP: 122/79 109/70  168/66  Pulse: 73 71  74  Temp: 97.4 F (36.3 C)   98.6 F (37 C)  TempSrc: Oral   Oral  Resp: 18 14    Height:   6' (1.829 m)   Weight:   145.877 kg  (321 lb 9.6 oz)   SpO2: 99% 98%  99%     Constitutional: Vital signs reviewed. Patient is a well-developed and well-nourished in no acute distress and cooperative with exam. Alert and oriented x3.  Head: Normocephalic and atraumatic  Ear: TM normal bilaterally  Mouth: no erythema or exudates, MMM  Eyes: PERRL, EOMI, conjunctivae normal, No scleral icterus.  Neck: Supple, Trachea midline normal ROM, No JVD, mass, thyromegaly, or carotid bruit present.  Cardiovascular: RRR, S1 normal, S2 normal, no MRG, pulses symmetric and intact bilaterally. Chest pain  not reproducible with palpation  Pulmonary/Chest: CTAB, no wheezes, rales, or rhonchi  Abdominal: Soft. Non-tender, non-distended, bowel sounds are normal, no masses, organomegaly, or guarding present.  GU: no CVA tenderness Musculoskeletal: No joint deformities, erythema,  or stiffness, ROM full and no nontender Ext: no edema and no cyanosis, pulses palpable bilaterally (DP and PT)  Hematology: no cervical, inginal, or axillary adenopathy.  Neurological: A&O x3, Strenght is normal and symmetric bilaterally, cranial nerve II-XII are grossly intact, no focal motor deficit, sensory intact to light touch bilaterally.  Skin: Warm, dry and intact. No rash, cyanosis, or clubbing.  Psychiatric: Normal mood and affect. speech and behavior is normal. Judgment and thought content normal. Cognition and memory are normal.      Data Review   Micro Results No results found for this or any previous visit (from the past 240 hour(s)).  Radiology Reports Dg Chest 2 View  09/17/2015  CLINICAL DATA:  Chest tightness today, nausea EXAM: CHEST  2 VIEW COMPARISON:  Chest x-ray dated 04/23/2015. FINDINGS: The heart size and mediastinal contours are within normal limits. Both lungs are clear. There are mild degenerative changes within the thoracic spine. No acute osseous abnormality. Slight elevation of the right hemidiaphragm is unchanged. IMPRESSION: No  evidence of acute cardiopulmonary abnormality. Electronically Signed   By: Franki Cabot M.D.   On: 09/17/2015 19:16     CBC  Recent Labs Lab 09/17/15 1820  WBC 9.5  HGB 12.6*  HCT 37.7*  PLT 268  MCV 79.4  MCH 26.5  MCHC 33.4  RDW 13.6    Chemistries   Recent Labs Lab 09/17/15 1820  NA 134*  K 3.7  CL 99*  CO2 24  GLUCOSE 251*  BUN 12  CREATININE 0.75  CALCIUM 9.4   ------------------------------------------------------------------------------------------------------------------ estimated creatinine clearance is 173 mL/min (by C-G formula based on Cr of 0.75). ------------------------------------------------------------------------------------------------------------------ No results for input(s): HGBA1C in the last 72 hours. ------------------------------------------------------------------------------------------------------------------ No results for input(s): CHOL, HDL, LDLCALC, TRIG, CHOLHDL, LDLDIRECT in the last 72 hours. ------------------------------------------------------------------------------------------------------------------ No results for input(s): TSH, T4TOTAL, T3FREE, THYROIDAB in the last 72 hours.  Invalid input(s): FREET3 ------------------------------------------------------------------------------------------------------------------ No results for input(s): VITAMINB12, FOLATE, FERRITIN, TIBC, IRON, RETICCTPCT in the last 72 hours.  Coagulation profile No results for input(s): INR, PROTIME in the last 168 hours.  No results for input(s): DDIMER in the last 72 hours.  Cardiac Enzymes  Recent Labs Lab 09/17/15 1820  TROPONINI <0.03   ------------------------------------------------------------------------------------------------------------------ Invalid input(s): POCBNP   CBG: No results for input(s): GLUCAP in the last 168 hours.     EKG: Independently reviewed. Normal sinus rhythm with nonspecific ST changes that appears  similar to previous tracings.   Assessment/Plan Principal Problem:    Chest pain: Acute. Given patient's previous history of stent placement and history of pain patient has a heart score of approximately 4. Suspect pain could have been secondary to patient's ingestion of indomethacin, but must rule out the possibility of cardiac ischemia -Admitting for observation on telemetry -Troponins 3 -Repeat EKG in a.m.  CAD S/P percutaneous coronary angioplasty - DES to LCx  -Continue aspirin and Plavix  DM (diabetes mellitus), type 2, uncontrolled (HCC) last hemoglobin A1c 13.1 back in June 2016  -Started patient on a sliding scale insulin and continued home Levemir doses -Held hypoglycemic agents - hemoglobin A1c -Counseled patient on the need of better diabetic management  Hyperlipidemia -Continue home statin    GOUT: Stable. Patient reports utilizing indomethacin daily basis in addition to allopurinol for prophylaxis for gout attacks. -Held  Indomethacin -Continue allopurinol  OBESITY -Counseled patient on the need of weight loss  Essential hypertension: Stable - continue Bystolic and Cozaar     GERD -Protonix  Code Status:   full Family Communication: bedside Disposition Plan: admit   Total time spent 55 minutes.Greater than 50% of this time was spent in counseling, explanation of diagnosis, planning of further management, and coordination of care  [facility] Pager [phone]  If 7PM-7AM, please contact night-coverage [email or web address] Password [email or web address] 09/17/2015, 11:35 PM

## 2015-09-17 NOTE — Progress Notes (Signed)
44 yo M with HX of CAD sp stent 2014 DES to Cix, last CAth non-oclusive disease on Aspirin came in with chest pain  Was somewhat exertional but also occurred after taking indomethacin on empty stomach. Got better with nitro ECG ok, Trop ok, currently chest pain free.  Primary Cards at [facility]. Being admitted for cardiac eval to tele bed, HEART score 4

## 2015-09-17 NOTE — ED Provider Notes (Signed)
CSN: [id number]     Arrival date & time 09/17/15  1806 History  By signing my name below, I, Aaron Franco, attest that this documentation has been prepared under the direction and in the presence of Merryl Hacker, MD. Electronically Signed: Hilda Franco, ED Scribe. 09/17/2015. 6:37 PM.    Chief Complaint  Patient presents with  . Chest Pain     The history is provided by the patient. No language interpreter was used.   HPI Comments: Aaron Franco is a 44 y.o. male with a hx of CAD, NSTEMI, HTN, HLD, DM who presents to the Emergency Department complaining of constant, improving central and right-sided burning and pressure chest pain that radiates to the right shoulder with associated lightheadedness, dizziness, and nausea that has been present for an hour and a half. Pt reports his symptoms began when he was out eating dinner with his wife, and states he felt tightness in his stomach, felt like he had gas, and then the pain went to his chest. He had just taken indomethacin. Pt states that walking makes his pain worse. Pt reports he took aspirin and a nitroglycerin before coming to ED with moderate relief. Pt states he had a cardiac catheterization in June of 2016, and reports a hx of NSTEMI in 2014. Pt denies vomiting or diaphoresis. Currently he is chest pain-free.    Past Medical History  Diagnosis Date  . Diabetes mellitus   . GERD (gastroesophageal reflux disease)   . Gout   . Hyperlipidemia   . Hypertension   . Obesity   . CAD (coronary artery disease) 05/2013    NSTEMI:  LHC with mCFX 100% => 3.0 mm  x 20 mm Promus premier DES; EF 65%  . NSTEMI (non-ST elevated myocardial infarction) ([place]) 05/2013   Past Surgical History  Procedure Laterality Date  . Biceps tendon repair  Nov 13 2010    right  . Hand surgery  1990    left  . Percutaneous coronary stent intervention (pci-s)  05/2013    mCx 3.60m x 20 mm Promus P DES  . Left heart cath N/A 05/12/2013    Procedure: LEFT  HEART CATH;  Surgeon: HSinclair Grooms MD;  [facility];  Service: Cardiovascular;  Laterality: N/A;  . Percutaneous coronary stent intervention (pci-s)  05/12/2013    Procedure: PERCUTANEOUS CORONARY STENT INTERVENTION (PCI-S);  Surgeon: HSinclair Grooms MD;  [facility];  Service: Cardiovascular;;  . Left heart catheterization with coronary angiogram N/A 01/24/2014    Procedure: LEFT HEART CATHETERIZATION WITH CORONARY ANGIOGRAM;  Surgeon: Peter M JMartinique MD;  [facility];  Service: Cardiovascular;  Laterality: N/A;   Family History  Problem Relation Age of Onset  . Heart attack Father     deceased  . Stroke Mother   . Diabetes Father   . Pancreatic cancer Maternal Aunt   . Colon cancer Neg Hx   . Heart failure Father    Social History  Substance Use Topics  . Smoking status: Former Smoker    Types: Cigarettes    Quit date: 11/10/1998  . Smokeless tobacco: Never Used     Comment: pt only smoked 1-2 cigs a day, not everyday on occs x 2 years  . Alcohol Use: Yes     Comment: rare    Review of Systems  Constitutional: Negative for diaphoresis.  Respiratory: Negative for shortness of breath.   Cardiovascular: Positive for chest pain.  Gastrointestinal: Positive  for nausea. Negative for vomiting and abdominal pain.  Neurological: Positive for dizziness and light-headedness.  All other systems reviewed and are negative.     Allergies  Metformin and Prednisone  Home Medications   Prior to Admission medications   Medication Sig Start Date End Date Taking? Authorizing Provider  allopurinol (ZYLOPRIM) 100 MG tablet Take 100 mg by mouth daily.    Historical Provider, MD  aspirin 81 MG chewable tablet Chew 1 tablet (81 mg total) by mouth daily. 05/14/13   Liliane Shi, PA-C  clopidogrel (PLAVIX) 75 MG tablet Take 75 mg by mouth daily. 03/15/15   Historical Provider, MD  colchicine 0.6 MG tablet Take 0.6 mg by mouth 2 (two) times daily. Then decrease  to daily a symptoms allow    Historical Provider, MD  CRESTOR 40 MG tablet Take 40 mg by mouth daily. 01/16/15   Historical Provider, MD  dexlansoprazole (DEXILANT) 60 MG capsule Take 60 mg by mouth daily.    Historical Provider, MD  glucose blood test strip Use as instructed 04/24/15   Reyne Dumas, MD  glucose monitoring kit (FREESTYLE) monitoring kit 1 each by Does not apply route as needed for other. 04/24/15   Reyne Dumas, MD  indomethacin (INDOCIN) 25 MG capsule Take 25-50 mg by mouth 3 (three) times daily. 03/19/15   Historical Provider, MD  insulin detemir (LEVEMIR) 100 UNIT/ML injection 35 units in the morning and 40 units in the evening 04/24/15   Reyne Dumas, MD  Lancets (FREESTYLE) lancets TID , q [facility] 04/24/15   Reyne Dumas, MD  losartan (COZAAR) 50 MG tablet Take 50 mg by mouth daily. 04/18/15   Historical Provider, MD  nebivolol (BYSTOLIC) 5 MG tablet Take 5 mg by mouth daily.    Historical Provider, MD  sitaGLIPtin (JANUVIA) 100 MG tablet Take 1 tablet (100 mg total) by mouth daily. 04/27/12   Midge Minium, MD   BP 122/79 mmHg  Pulse 73  Temp(Src) 97.4 F (36.3 C) (Oral)  Resp 18  Ht 6' (1.829 m)  Wt 315 lb (142.883 kg)  BMI 42.71 kg/m2  SpO2 99% Physical Exam  Constitutional: He is oriented to person, place, and time. He appears well-developed and well-nourished. No distress.  Obese  HENT:  Head: Normocephalic and atraumatic.  Cardiovascular: Normal rate, regular rhythm and normal heart sounds.   No murmur heard. Pulmonary/Chest: Effort normal and breath sounds normal. No respiratory distress. He has no wheezes. He exhibits no tenderness.  Abdominal: Soft. Bowel sounds are normal. There is no tenderness. There is no rebound.  Musculoskeletal: He exhibits edema.  Trace bilateral lower extremity edema  Neurological: He is alert and oriented to person, place, and time.  Skin: Skin is warm and dry.  Psychiatric: He has a normal mood and affect.  Nursing note and vitals  reviewed.   ED Course  Procedures (including critical care time)  DIAGNOSTIC STUDIES: Oxygen Saturation is 99% on room air, normal by my interpretation.    COORDINATION OF CARE: 6:35 PM Discussed treatment plan with pt at bedside and pt agreed to plan.   Labs Review Labs Reviewed  BASIC METABOLIC PANEL - Abnormal; Notable for the following:    Sodium 134 (*)    Chloride 99 (*)    Glucose, Bld 251 (*)    All other components within normal limits  CBC - Abnormal; Notable for the following:    Hemoglobin 12.6 (*)    HCT 37.7 (*)    All other components  within normal limits  TROPONIN I    Imaging Review Dg Chest 2 View  09/17/2015  CLINICAL DATA:  Chest tightness today, nausea EXAM: CHEST  2 VIEW COMPARISON:  Chest x-ray dated 04/23/2015. FINDINGS: The heart size and mediastinal contours are within normal limits. Both lungs are clear. There are mild degenerative changes within the thoracic spine. No acute osseous abnormality. Slight elevation of the right hemidiaphragm is unchanged. IMPRESSION: No evidence of acute cardiopulmonary abnormality. Electronically Signed   By: Franki Cabot M.D.   On: 09/17/2015 19:16   I have personally reviewed and evaluated these images and lab results as part of my medical decision-making.   EKG Interpretation   Date/Time:  Monday September 17 2015 19:14:59 EST Ventricular Rate:  77 PR Interval:  170 QRS Duration: 94 QT Interval:  396 QTC Calculation: 448 R Axis:   23 Text Interpretation:  Normal sinus rhythm Nonspecific ST abnormality  Abnormal ECG No significant change since last tracing Confirmed by Velta Rockholt   MD, Loma Sousa ([id number]) on 09/17/2015 6:17:02 PM      MDM   Final diagnoses:  Other chest pain    Patient presents with chest pain. History of coronary artery disease. Last cardiac catheterization showed nonocclusive disease. Medical management. Followed primarily at [facility]. Some teachers of his pain are suggestive of cardiac  including worse with ambulation and improvement with nitroglycerin. However, onset of pain after taking indomethacin with the nausea could also be related to GI. EKG is reassuring. Chest x-ray is negative. Initial lab work is reassuring including troponin. Patient's heart score is 4. Given his history, patient will be admitted for observation and serial enzymes.  I personally performed the services described in this documentation, which was scribed in my presence. The recorded information has been reviewed and is accurate.    Merryl Hacker, MD 09/17/15 2024

## 2015-09-17 NOTE — ED Notes (Signed)
Report given to [facility] at this time.

## 2015-09-18 ENCOUNTER — Encounter (HOSPITAL_COMMUNITY): Payer: Self-pay | Admitting: Nurse Practitioner

## 2015-09-18 ENCOUNTER — Other Ambulatory Visit: Payer: Self-pay | Admitting: Nurse Practitioner

## 2015-09-18 DIAGNOSIS — R072 Precordial pain: Secondary | ICD-10-CM | POA: Diagnosis not present

## 2015-09-18 DIAGNOSIS — M109 Gout, unspecified: Secondary | ICD-10-CM | POA: Diagnosis not present

## 2015-09-18 DIAGNOSIS — E1165 Type 2 diabetes mellitus with hyperglycemia: Secondary | ICD-10-CM

## 2015-09-18 DIAGNOSIS — E118 Type 2 diabetes mellitus with unspecified complications: Secondary | ICD-10-CM

## 2015-09-18 DIAGNOSIS — E785 Hyperlipidemia, unspecified: Secondary | ICD-10-CM | POA: Diagnosis not present

## 2015-09-18 DIAGNOSIS — I10 Essential (primary) hypertension: Secondary | ICD-10-CM

## 2015-09-18 DIAGNOSIS — R0789 Other chest pain: Secondary | ICD-10-CM

## 2015-09-18 DIAGNOSIS — I2510 Atherosclerotic heart disease of native coronary artery without angina pectoris: Secondary | ICD-10-CM | POA: Diagnosis not present

## 2015-09-18 DIAGNOSIS — Z794 Long term (current) use of insulin: Secondary | ICD-10-CM

## 2015-09-18 DIAGNOSIS — R079 Chest pain, unspecified: Secondary | ICD-10-CM | POA: Diagnosis not present

## 2015-09-18 LAB — TROPONIN I: Troponin I: <0.03 ng/mL (ref <0.031)

## 2015-09-18 LAB — GLUCOSE, CAPILLARY
GLUCOSE-CAPILLARY: 234 mg/dL — AB (ref 65–99)
GLUCOSE-CAPILLARY: 195 mg/dL — AB (ref 65–99)
Glucose-Capillary: 238 mg/dL — ABNORMAL HIGH (ref 65–99)

## 2015-09-18 MED ORDER — CLOPIDOGREL BISULFATE 75 MG PO TABS
75.0000 mg | ORAL_TABLET | Freq: Every day | ORAL | Status: DC
  Administered 2015-09-18: 75 mg via ORAL
  Filled 2015-09-18: qty 1

## 2015-09-18 MED ORDER — ACETAMINOPHEN 325 MG PO TABS: 650.0000 mg | ORAL_TABLET | ORAL | Status: DC | PRN

## 2015-09-18 MED ORDER — ONDANSETRON HCL 4 MG/2ML IJ SOLN: 4.0000 mg | Freq: Four times a day (QID) | INTRAMUSCULAR | Status: DC | PRN

## 2015-09-18 MED ORDER — PANTOPRAZOLE SODIUM 40 MG PO TBEC
40.0000 mg | DELAYED_RELEASE_TABLET | Freq: Every day | ORAL | Status: DC
  Administered 2015-09-18: 40 mg via ORAL
  Filled 2015-09-18: qty 1

## 2015-09-18 MED ORDER — MORPHINE SULFATE (PF) 2 MG/ML IV SOLN: 2.0000 mg | INTRAVENOUS | Status: DC | PRN

## 2015-09-18 MED ORDER — ALLOPURINOL 100 MG PO TABS
100.0000 mg | ORAL_TABLET | Freq: Every day | ORAL | Status: DC
  Administered 2015-09-18: 100 mg via ORAL
  Filled 2015-09-18: qty 1

## 2015-09-18 MED ORDER — INSULIN DETEMIR 100 UNIT/ML ~~LOC~~ SOLN
35.0000 [IU] | Freq: Every day | SUBCUTANEOUS | Status: DC
  Filled 2015-09-18: qty 0.35

## 2015-09-18 MED ORDER — NEBIVOLOL HCL 5 MG PO TABS
5.0000 mg | ORAL_TABLET | Freq: Every day | ORAL | Status: DC
  Administered 2015-09-18: 5 mg via ORAL
  Filled 2015-09-18: qty 1

## 2015-09-18 MED ORDER — INSULIN ASPART 100 UNIT/ML ~~LOC~~ SOLN
0.0000 [IU] | Freq: Three times a day (TID) | SUBCUTANEOUS | Status: DC
Start: 2015-09-18 — End: 2015-09-18
  Administered 2015-09-18: 2 [IU] via SUBCUTANEOUS

## 2015-09-18 MED ORDER — ASPIRIN 81 MG PO CHEW
81.0000 mg | CHEWABLE_TABLET | Freq: Every day | ORAL | Status: DC
  Administered 2015-09-18: 81 mg via ORAL
  Filled 2015-09-18: qty 1

## 2015-09-18 MED ORDER — INSULIN DETEMIR 100 UNIT/ML ~~LOC~~ SOLN
40.0000 [IU] | Freq: Every day | SUBCUTANEOUS | Status: DC
  Administered 2015-09-18: 40 [IU] via SUBCUTANEOUS
  Filled 2015-09-18 (×2): qty 0.4

## 2015-09-18 MED ORDER — INSULIN DETEMIR 100 UNIT/ML ~~LOC~~ SOLN: 35.0000 [IU] | Freq: Two times a day (BID) | SUBCUTANEOUS | Status: DC

## 2015-09-18 MED ORDER — ENOXAPARIN SODIUM 40 MG/0.4ML ~~LOC~~ SOLN
40.0000 mg | Freq: Every day | SUBCUTANEOUS | Status: DC
  Administered 2015-09-18: 40 mg via SUBCUTANEOUS
  Filled 2015-09-18: qty 0.4

## 2015-09-18 MED ORDER — ROSUVASTATIN CALCIUM 10 MG PO TABS
40.0000 mg | ORAL_TABLET | Freq: Every day | ORAL | Status: DC
  Administered 2015-09-18: 40 mg via ORAL
  Filled 2015-09-18 (×2): qty 4

## 2015-09-18 MED ORDER — LOSARTAN POTASSIUM 50 MG PO TABS
50.0000 mg | ORAL_TABLET | Freq: Every day | ORAL | Status: DC
  Administered 2015-09-18: 50 mg via ORAL
  Filled 2015-09-18: qty 1

## 2015-09-18 NOTE — Discharge Summary (Signed)
PATIENT DETAILS Name: Aaron Franco Age: 44 y.o. Sex: male Date of Birth: 11-15-1970 MRN: [id number]. Admitting Physician: Norval Morton, MD FKC:LEXN,TZGYFV B., MD  Admit Date: 09/17/2015 Discharge date: 09/18/2015  Recommendations for Outpatient Follow-up:  1. Follow-up with PCP, Dr. Tiana Loft in 1-2 weeks  2. [facility] on 11/14 for outpatient stress testing.  3. Appointment with Dr. Stanford Breed on 11/13/16 at 9 am.   PRIMARY DISCHARGE DIAGNOSIS:  Principal Problem:   Chest pain Active Problems:   Hyperlipidemia   GOUT   OBESITY   Essential hypertension   GERD   CAD S/P percutaneous coronary angioplasty - DES to LCx    DM (diabetes mellitus), type 2, uncontrolled (HCC)      PAST MEDICAL HISTORY: Past Medical History  Diagnosis Date  . Diabetes mellitus   . GERD (gastroesophageal reflux disease)   . Gout   . Hyperlipidemia   . Essential hypertension   . Obesity   . CAD (coronary artery disease)     a. 05/2013 NSTEMI/PCI: mCFX 100%-> 3.0 mm  x 20 mm Promus premier DES, EF 65%;  01/2014 Cath: LM nl, LAD 20-30d, LCX patent distal stent, RCA 30-11m EF 55-65%.  . History of echocardiogram     a. 03/2011 Echo: EF 65%, mild LVH, no rwma, mildly dil LA.    DISCHARGE MEDICATIONS: Current Discharge Medication List    CONTINUE these medications which have NOT CHANGED   Details  allopurinol (ZYLOPRIM) 100 MG tablet Take 100 mg by mouth daily.    aspirin 81 MG chewable tablet Chew 1 tablet (81 mg total) by mouth daily.    colchicine 0.6 MG tablet Take 0.6 mg by mouth 2 (two) times daily. Then decrease to daily a symptoms allow    CRESTOR 40 MG tablet Take 40 mg by mouth daily. Refills: 3    dexlansoprazole (DEXILANT) 60 MG capsule Take 60 mg by mouth daily.    indomethacin (INDOCIN) 25 MG capsule Take 25-50 mg by mouth 3 (three) times daily. Refills: 2    insulin detemir (LEVEMIR) 100 UNIT/ML injection 35 units in the morning and 40 units in the evening Qty:  10 mL, Refills: 11    losartan (COZAAR) 50 MG tablet Take 50 mg by mouth daily. Refills: 3    nebivolol (BYSTOLIC) 5 MG tablet Take 5 mg by mouth daily.    sitaGLIPtin (JANUVIA) 100 MG tablet Take 1 tablet (100 mg total) by mouth daily. Qty: 30 tablet, Refills: 0    glucose blood test strip Use as instructed Qty: 100 each, Refills: 12    glucose monitoring kit (FREESTYLE) monitoring kit 1 each by Does not apply route as needed for other. Qty: 1 each, Refills: 2    Lancets (FREESTYLE) lancets TID , q AC Qty: 100 each, Refills: 12      STOP taking these medications     clopidogrel (PLAVIX) 75 MG tablet         ALLERGIES:   Allergies  Allergen Reactions  . Metformin Other (See Comments)    Chest discomfort  . Prednisone     Mood swings    BRIEF HPI:  See H&P, Labs, Consult and Test reports for all details in brief, patient is a 44y/o male with PMH of CAD s/p stent 2014, hyperlipidemia, HTN, Type II DM and gout who present to the ED on 11/7 with complaints of chest pain. He reported that he had just taken his indomethacin pill on an empty stomach and soon  after began having abdominal pain that moved up into his chest and felt like "gas pressure". He admits to similar symptoms several months ago also after taking indomethacin. He was admitted for further cardiac work-up.   CONSULTATIONS:   cardiology  PERTINENT RADIOLOGIC STUDIES: Dg Chest 2 View  09/17/2015  CLINICAL DATA:  Chest tightness today, nausea EXAM: CHEST  2 VIEW COMPARISON:  Chest x-ray dated 04/23/2015. FINDINGS: The heart size and mediastinal contours are within normal limits. Both lungs are clear. There are mild degenerative changes within the thoracic spine. No acute osseous abnormality. Slight elevation of the right hemidiaphragm is unchanged. IMPRESSION: No evidence of acute cardiopulmonary abnormality. Electronically Signed   By: Franki Cabot M.D.   On: 09/17/2015 19:16     PERTINENT LAB  RESULTS: CBC:  Recent Labs  09/17/15 1820  WBC 9.5  HGB 12.6*  HCT 37.7*  PLT 268   CMET CMP     Component Value Date/Time   NA 134* 09/17/2015 1820   K 3.7 09/17/2015 1820   CL 99* 09/17/2015 1820   CO2 24 09/17/2015 1820   GLUCOSE 251* 09/17/2015 1820   BUN 12 09/17/2015 1820   CREATININE 0.75 09/17/2015 1820   CALCIUM 9.4 09/17/2015 1820   PROT 8.3 05/12/2013 2300   ALBUMIN 4.3 05/12/2013 2300   AST 18 05/12/2013 2300   ALT 23 05/12/2013 2300   ALKPHOS 75 05/12/2013 2300   BILITOT 0.3 05/12/2013 2300   GFRNONAA >60 09/17/2015 1820   GFRAA >60 09/17/2015 1820    GFR Estimated Creatinine Clearance: 173 mL/min (by C-G formula based on Cr of 0.75). No results for input(s): LIPASE, AMYLASE in the last 72 hours.  Recent Labs  09/18/15 0100 09/18/15 0345 09/18/15 0845  TROPONINI <0.03 <0.03 <0.03   Invalid input(s): POCBNP No results for input(s): DDIMER in the last 72 hours. No results for input(s): HGBA1C in the last 72 hours. No results for input(s): CHOL, HDL, LDLCALC, TRIG, CHOLHDL, LDLDIRECT in the last 72 hours. No results for input(s): TSH, T4TOTAL, T3FREE, THYROIDAB in the last 72 hours.  Invalid input(s): FREET3 No results for input(s): VITAMINB12, FOLATE, FERRITIN, TIBC, IRON, RETICCTPCT in the last 72 hours. Coags: No results for input(s): INR in the last 72 hours.  Invalid input(s): PT Microbiology: No results found for this or any previous visit (from the past 240 hour(s)).   BRIEF HOSPITAL COURSE:   Principal Problem:  Chest pain - Likely related to GI discomfort after taking indomethacin. Cardiac enzymes negative. EKG with no acute changes. His last cardiac cath was March 2015 and was found to have nonobstructive coronary artery disease at that time. Cardiology consulted and recommend outpatient stress testing with patient's primary cardiologist. Will continue ASA, Bystolic, Crestor and Losartan.   Active Problems: Hyperlipidemia -  Continue Crestor   Gout - No current flare-up. Continue Allopurinol and Indomethacin as needed. Encouraged patient to take Indomethacin with food to prevent     GI upset.   Obesity - Encouraged weight loss.   Essential hypertension - BPs stable since admission. Will continue Bystolic and Losartan.  GERD - Stable. Continue Dexilant.   CAD S/P percutaneous coronary angioplasty - DES to LCx, last coronary cath performed March 2015 with nonobstructive CAD.   DM Type II - A1C performed 04/13/2915 - 13.1. Currently followed at [facility] in [facility]. Continue Levemir and Januvia.      TODAY-DAY OF DISCHARGE:  Subjective:   Denies chest pain, SOB today.   Objective:  Blood pressure 140/83, pulse 66, temperature 98.4 F (36.9 C), temperature source Oral, resp. rate 15, height 6' (1.829 m), weight 145.877 kg (321 lb 9.6 oz), SpO2 97 %.  Intake/Output Summary (Last 24 hours) at 09/18/15 1033 Last data filed at 09/17/15 2221  Gross per 24 hour  Intake      0 ml  Output   1051 ml  Net  -1051 ml   Filed Weights   09/17/15 1810 09/17/15 2200  Weight: 142.883 kg (315 lb) 145.877 kg (321 lb 9.6 oz)    Exam Awake Alert, Oriented *3, No new F.N deficits, Normal affect [place].AT,PERRAL Supple Neck,No JVD, No cervical lymphadenopathy appriciated.  Symmetrical Chest wall movement, Good air movement bilaterally, CTAB RRR,No Gallops,Rubs or new Murmurs, No Parasternal Heave +ve B.Sounds, Abd Soft, Non tender, No organomegaly appriciated, No rebound -guarding or rigidity. No Cyanosis, Clubbing or edema, No new Rash or bruise  DISCHARGE CONDITION: Stable  DISPOSITION: Home   DISCHARGE INSTRUCTIONS:    Activity:  As tolerated  Get Medicines reviewed and adjusted: Please take all your medications with you for your next visit with your Primary MD  Please request your Primary MD to go over all hospital tests and procedure/radiological results at the follow up, please ask your Primary MD to get  all Hospital records sent to his/her office.  If you experience worsening of your admission symptoms, develop shortness of breath, life threatening emergency, suicidal or homicidal thoughts you must seek medical attention immediately by calling 911 or calling your MD immediately  if symptoms less severe.  You must read complete instructions/literature along with all the possible adverse reactions/side effects for all the Medicines you take and that have been prescribed to you. Take any new Medicines after you have completely understood and accpet all the possible adverse reactions/side effects.   Do not drive when taking Pain medications.   Do not take more than prescribed Pain, Sleep and Anxiety Medications  Special Instructions: If you have smoked or chewed Tobacco  in the last 2 yrs please stop smoking, stop any regular Alcohol  and or any Recreational drug use.  Wear Seat belts while driving.  Please note  You were cared for by a hospitalist during your hospital stay. Once you are discharged, your primary care physician will handle any further medical issues. Please note that NO REFILLS for any discharge medications will be authorized once you are discharged, as it is imperative that you return to your primary care physician (or establish a relationship with a primary care physician if you do not have one) for your aftercare needs so that they can reassess your need for medications and monitor your lab values.   Diet recommendation: Diabetic Diet Heart Healthy diet  Discharge Instructions    Call MD for:  difficulty breathing, headache or visual disturbances    Complete by:  As directed      Call MD for:  persistant dizziness or light-headedness    Complete by:  As directed      Call MD for:  persistant nausea and vomiting    Complete by:  As directed      Call MD for:  severe uncontrolled pain    Complete by:  As directed      Diet - low sodium heart healthy    Complete by:  As  directed      Increase activity slowly    Complete by:  As directed  Follow-up Information    Follow up with [facility] On 09/24/2015.   Why:  12:45 PM - first day of stress test.  Second day will be 11/15 @ 12:45 PM.   Contact information:   [place] [address] [phone]      Follow up with Kirk Ruths, MD On 11/14/2015.   Why:  9:00 AM   Contact information:   [address] [place], [place] [address] [phone]       Follow up with JOBE,DANIEL B., MD In 2 weeks.   Specialty:  Internal Medicine   Why:  For hospital follow-up   Contact information:   [address] [place] [address] [phone]       Total Time spent on discharge equals 25 min  Signed: Oren Binet 09/18/2015 10:33 AM

## 2015-09-18 NOTE — Consult Note (Signed)
CARDIOLOGY CONSULT NOTE   Patient ID: Aaron Franco MRN: [id number], DOB/AGE: 45-Jun-1972   Admit date: 09/17/2015 Date of Consult: 09/18/2015  Primary Physician: Malka SoJOBE,DANIEL B., MD Primary Cardiologist: Assunta Foundaryl Kalil, MD - [facility]  Pt. Profile  [age] y /o male with prior history of coronary artery disease status post drug-eluting stent placement to the left circumflex in July 2014, who was admitted on November 7 with chest pain after taking a dose of indomethacin.  Problem List  Past Medical History  Diagnosis Date  . Diabetes mellitus   . GERD (gastroesophageal reflux disease)   . Gout   . Hyperlipidemia   . Essential hypertension   . Obesity   . CAD (coronary artery disease)     a. 05/2013 NSTEMI/PCI: mCFX 100%-> 3.0 mm  x 20 mm Promus premier DES, EF 65%;  01/2014 Cath: LM nl, LAD 20-30d, LCX patent distal stent, RCA 30-8731m, EF 55-65%.  . History of echocardiogram     a. 03/2011 Echo: EF 65%, mild LVH, no rwma, mildly dil LA.    Past Surgical History  Procedure Laterality Date  . Biceps tendon repair  Nov 13 2010    right  . Hand surgery  1990    left  . Percutaneous coronary stent intervention (pci-s)  05/2013    mCx 3.531mm x 20 mm Promus P DES  . Left heart cath N/A 05/12/2013    Procedure: LEFT HEART CATH;  Surgeon: Lesleigh NoeHenry W Smith III, MD;  [facility];  Service: Cardiovascular;  Laterality: N/A;  . Percutaneous coronary stent intervention (pci-s)  05/12/2013    Procedure: PERCUTANEOUS CORONARY STENT INTERVENTION (PCI-S);  Surgeon: Lesleigh NoeHenry W Smith III, MD;  [facility];  Service: Cardiovascular;;  . Left heart catheterization with coronary angiogram N/A 01/24/2014    Procedure: LEFT HEART CATHETERIZATION WITH CORONARY ANGIOGRAM;  Surgeon: Peter M SwazilandJordan, MD;  [facility];  Service: Cardiovascular;  Laterality: N/A;     Allergies  Allergies  Allergen Reactions  . Metformin Other (See Comments)    Chest discomfort  . Prednisone     Mood swings     HPI   44 year old male with prior history of coronary artery disease, hypertension, hyperlipidemia, obesity, and type 2 diabetes mellitus. He is status post PCI drooling stent placement to the left circumflex in July 2014. He last underwent catheterization March 2015 revealing nonobstructive CAD. [facility] was [facility] 2016 with atypical chest discomfort. Outpatient follow-up was recommended. He lives in [place] with his wife and generally does pretty well without chest pain or dyspnea. 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 He has noted over the past few years, but he occasionally will experience mild chest discomfort after taking his morning medications. He has been able to alleviate the symptoms by spreading out his medications throughout the morning, as opposed to taking all at once. He has a history of gout and takes allopurinol on a regular basis for this. He recently ran out of this and yesterday decided to take a dose of indomethacin instead, "just to have something on board." He had not eaten up to that point and immediately after taking a dose of indomethacin he noted abdominal bloating and discomfort followed by a right sided mild chest discomfort and belching. He described the discomfort as feeling as though he had an air bubble that he needed to get rid of. This was  not similar to prior angina. He denies dyspnea. After an hour of ongoing symptoms, he took some aspirin and nitroglycerin with eventual relief. He presented to the ED for evaluation where ECG showed early repolarization and troponin was normal. He has had no recurrent symptoms and troponins have remained negative.  Inpatient Medications  . allopurinol  100 mg Oral Daily  . aspirin  81 mg Oral Daily  . clopidogrel  75 mg Oral Daily  . enoxaparin (LOVENOX) injection  40 mg Subcutaneous QHS  . insulin aspart  0-9 Units Subcutaneous TID WC   . insulin detemir  35 Units Subcutaneous Daily  . insulin detemir  40 Units Subcutaneous QHS  . losartan  50 mg Oral Daily  . nebivolol  5 mg Oral Daily  . pantoprazole  40 mg Oral Daily  . rosuvastatin  40 mg Oral q1800    Family History Family History  Problem Relation Age of Onset  . Heart attack Father     deceased  . Stroke Mother   . Diabetes Father   . Pancreatic cancer Maternal Aunt   . Colon cancer Neg Hx   . Heart failure Father      Social History Social History   Social History  . Marital Status: Married    Spouse Name: N/A  . Number of Children: 2  . Years of Education: N/A   Occupational History  . material handler [employer]   Social History Main Topics  . Smoking status: Former Smoker    Types: Cigarettes    Quit date: 11/10/1998  . Smokeless tobacco: Never Used     Comment: pt only smoked 1-2 cigs a day, not everyday on occs x 2 years  . Alcohol Use: Yes     Comment: rare  . Drug Use: No  . Sexual Activity: Not on file   Other Topics Concern  . Not on file   Social History Narrative     Review of Systems  General:  No chills, fever, night sweats or weight changes.  Cardiovascular: Positive right-sided chest pain and epigastric pain after taking indomethacin on an empty stomach yesterday. No dyspnea on exertion, edema, orthopnea, palpitations, paroxysmal nocturnal dyspnea. Dermatological: No rash, lesions/masses Respiratory: No cough, dyspnea Urologic: No hematuria, dysuria Abdominal:   No nausea, vomiting, diarrhea, bright red blood per rectum, melena, or hematemesis Neurologic:  No visual changes, wkns, changes in mental status. All other systems reviewed and are otherwise negative except as noted above.  Physical Exam  Blood pressure 140/83, pulse 66, temperature 98.4 F (36.9 C), temperature source Oral, resp. rate 15, height 6' (1.829 m), weight 321 lb 9.6 oz (145.877 kg), SpO2 97 %.  General: Pleasant, NAD Psych: Normal  affect. Neuro: Alert and oriented X 3. Moves all extremities spontaneously. HEENT: Normal  Neck: Supple without bruits. Obese, difficult to assess JVP. Lungs:  Resp regular and unlabored, CTA. Heart: RRR no s3, s4, or murmurs. Abdomen: Soft, non-tender, non-distended, BS + x 4.  Extremities: No clubbing, cyanosis or edema. DP/PT/Radials 2+ and equal bilaterally.  Labs   Recent Labs  09/17/15 1820 09/18/15 0100 09/18/15 0345 09/18/15 0845  TROPONINI <0.03 <0.03 <0.03 <0.03   Lab Results  Component Value Date   WBC 9.5 09/17/2015   HGB 12.6* 09/17/2015   HCT 37.7* 09/17/2015   MCV 79.4 09/17/2015   PLT 268 09/17/2015     Recent Labs Lab 09/17/15 1820  NA 134*  K 3.7  CL 99*  CO2 24  BUN  12  CREATININE 0.75  CALCIUM 9.4  GLUCOSE 251*   Radiology/Studies  Dg Chest 2 View  09/17/2015  CLINICAL DATA:  Chest tightness today, nausea EXAM: CHEST  2 VIEW COMPARISON:  Chest x-ray dated 04/23/2015. FINDINGS: The heart size and mediastinal contours are within normal limits. Both lungs are clear. There are mild degenerative changes within the thoracic spine. No acute osseous abnormality. Slight elevation of the right hemidiaphragm is unchanged. IMPRESSION: No evidence of acute cardiopulmonary abnormality. Electronically Signed   By: Bary Richard M.D.   On: 09/17/2015 19:16    ECG  Suspect a cardiac, 59, early repolarization.  ASSESSMENT AND PLAN  1. Right-sided and epigastric chest discomfort/coronary artery disease: Patient presented to the emergency department on November 7 following an hour-long episode of somewhat atypical right-sided chest and epigastric discomfort after taking a dose of indomethacin on an empty stomach. He expressed a similar discomfort when taking indomethacin on an empty stomach earlier this year. ECG notable for early polarization however no acute changes. Troponin remains negative. He has had no recurrent symptoms. He is status post PCI and juggling  stent placed to the left circumflex in 2014 with patent stent and nonobstructive CAD by catheterization in March 2015. He most recently has been followed by [facility] [place] Recommend discharge and outpatient f/u with primary cardiologist to consider outpt stress testing.  Cont asa, plavix, bb, arb, statin.  2.  Essential HTN:  Stable.  Cont home meds.  3.  HL:  Cont high potency statin.  4.  DM II:  Followed @ [facility]).  5.  Morbid Obesity: would benefit from outpt nutritional and exercise counseling.  Signed, Nicolasa Ducking, NP 09/18/2015, 10:02 AM As above, patient seen and examined. Briefly he is a 44 year old male with past medical history of coronary artery disease, diabetes mellitus, hypertension, hyperlipidemia for evaluation of chest pain. Patient had previous PCI of his circumflex in 2014. Cardiac catheterization in 2015 revealed nonobstructive disease. He typically does not have significant dyspnea on exertion, orthopnea, PND, pedal edema, exertional chest pain or syncope. He took indomethacin yesterday and subsequently developed pain in his epigastric area radiating to his chest. Symptoms lasted approximately 1 hour and resolved spontaneously. He has had no further symptoms since then. Electrocardiogram shows sinus rhythm with early repolarization abnormality. Enzymes are negative. Chest pain is very atypical. Patient can be discharged from a cardiac standpoint with outpatient nuclear study. Would continue aspirin, statin and beta blocker. Discontinue Plavix as it has been greater than 1 year since his previous PCI. Long discussion today concerning risk factor and lifestyle modification. Olga Millers

## 2015-09-18 NOTE — Discharge Instructions (Signed)
***  PLEASE REMEMBER TO BRING ALL OF YOUR MEDICATIONS TO EACH OF YOUR FOLLOW-UP OFFICE VISITS.  

## 2015-09-19 ENCOUNTER — Telehealth (HOSPITAL_COMMUNITY): Payer: Self-pay

## 2015-09-19 LAB — HEMOGLOBIN A1C
Hgb A1c MFr Bld: 11.8 % — ABNORMAL HIGH (ref 4.8–5.6)
MEAN PLASMA GLUCOSE: 292 mg/dL

## 2015-09-19 NOTE — Telephone Encounter (Signed)
Left message on voicemail in reference to upcoming appointment scheduled for 09-24-2015. Phone number given for a call back so details instructions can be given. Mckensie Scotti A   

## 2015-09-24 ENCOUNTER — Ambulatory Visit (HOSPITAL_COMMUNITY): Payer: BC Managed Care – PPO

## 2015-09-25 ENCOUNTER — Ambulatory Visit (HOSPITAL_COMMUNITY): Payer: BC Managed Care – PPO

## 2015-10-22 ENCOUNTER — Ambulatory Visit (HOSPITAL_COMMUNITY): Payer: BC Managed Care – PPO

## 2015-10-23 ENCOUNTER — Ambulatory Visit (HOSPITAL_COMMUNITY): Payer: BC Managed Care – PPO

## 2015-10-31 ENCOUNTER — Encounter: Payer: Self-pay | Admitting: *Deleted

## 2015-10-31 ENCOUNTER — Telehealth (HOSPITAL_COMMUNITY): Payer: Self-pay | Admitting: *Deleted

## 2015-10-31 NOTE — Telephone Encounter (Signed)
Left message on voicemail in reference to upcoming appointment scheduled for 11/06/15. Phone number given for a call back so details instructions can be given. Heena Woodbury J Enis Riecke, RN 

## 2015-11-01 ENCOUNTER — Telehealth (HOSPITAL_COMMUNITY): Payer: Self-pay | Admitting: *Deleted

## 2015-11-01 NOTE — Telephone Encounter (Signed)
Patient called to give instructions for nuclear stress test for 11/06/15. Patient stated he needs to cancel appointment cause he will be out of town. Patient stated he would call us back to reschedule.Lizanne Erker, Adelene IdlerCynthia W

## 2015-11-06 ENCOUNTER — Ambulatory Visit (HOSPITAL_COMMUNITY): Payer: BC Managed Care – PPO

## 2015-11-07 ENCOUNTER — Ambulatory Visit (HOSPITAL_COMMUNITY): Payer: BC Managed Care – PPO

## 2015-11-14 ENCOUNTER — Ambulatory Visit: Payer: BC Managed Care – PPO | Admitting: Cardiology

## 2016-02-18 DIAGNOSIS — Z6841 Body Mass Index (BMI) 40.0 and over, adult: Secondary | ICD-10-CM | POA: Insufficient documentation

## 2016-02-18 DIAGNOSIS — E669 Obesity, unspecified: Secondary | ICD-10-CM | POA: Insufficient documentation

## 2017-05-04 ENCOUNTER — Observation Stay (HOSPITAL_BASED_OUTPATIENT_CLINIC_OR_DEPARTMENT_OTHER)
Admission: EM | Admit: 2017-05-04 | Discharge: 2017-05-06 | Disposition: A | Discharge status: Home / Self Care | Payer: BC Managed Care – PPO | Attending: Interventional Cardiology | Admitting: Interventional Cardiology

## 2017-05-04 ENCOUNTER — Emergency Department (HOSPITAL_BASED_OUTPATIENT_CLINIC_OR_DEPARTMENT_OTHER): Payer: BC Managed Care – PPO

## 2017-05-04 ENCOUNTER — Encounter (HOSPITAL_BASED_OUTPATIENT_CLINIC_OR_DEPARTMENT_OTHER): Payer: Self-pay | Admitting: *Deleted

## 2017-05-04 DIAGNOSIS — E1165 Type 2 diabetes mellitus with hyperglycemia: Secondary | ICD-10-CM | POA: Insufficient documentation

## 2017-05-04 DIAGNOSIS — E781 Pure hyperglyceridemia: Secondary | ICD-10-CM | POA: Insufficient documentation

## 2017-05-04 DIAGNOSIS — E785 Hyperlipidemia, unspecified: Secondary | ICD-10-CM | POA: Diagnosis not present

## 2017-05-04 DIAGNOSIS — R079 Chest pain, unspecified: Secondary | ICD-10-CM | POA: Diagnosis not present

## 2017-05-04 DIAGNOSIS — I252 Old myocardial infarction: Secondary | ICD-10-CM | POA: Diagnosis not present

## 2017-05-04 DIAGNOSIS — Z87891 Personal history of nicotine dependence: Secondary | ICD-10-CM | POA: Diagnosis not present

## 2017-05-04 DIAGNOSIS — E119 Type 2 diabetes mellitus without complications: Secondary | ICD-10-CM

## 2017-05-04 DIAGNOSIS — Z955 Presence of coronary angioplasty implant and graft: Secondary | ICD-10-CM | POA: Insufficient documentation

## 2017-05-04 DIAGNOSIS — M109 Gout, unspecified: Secondary | ICD-10-CM | POA: Diagnosis not present

## 2017-05-04 DIAGNOSIS — I10 Essential (primary) hypertension: Secondary | ICD-10-CM | POA: Diagnosis not present

## 2017-05-04 DIAGNOSIS — Z7982 Long term (current) use of aspirin: Secondary | ICD-10-CM | POA: Diagnosis not present

## 2017-05-04 DIAGNOSIS — E1140 Type 2 diabetes mellitus with diabetic neuropathy, unspecified: Secondary | ICD-10-CM

## 2017-05-04 DIAGNOSIS — Z79899 Other long term (current) drug therapy: Secondary | ICD-10-CM | POA: Insufficient documentation

## 2017-05-04 DIAGNOSIS — Z794 Long term (current) use of insulin: Secondary | ICD-10-CM | POA: Diagnosis not present

## 2017-05-04 DIAGNOSIS — I119 Hypertensive heart disease without heart failure: Secondary | ICD-10-CM | POA: Diagnosis not present

## 2017-05-04 DIAGNOSIS — Z9861 Coronary angioplasty status: Secondary | ICD-10-CM

## 2017-05-04 DIAGNOSIS — I2510 Atherosclerotic heart disease of native coronary artery without angina pectoris: Secondary | ICD-10-CM | POA: Diagnosis not present

## 2017-05-04 HISTORY — DX: Old myocardial infarction: I25.2

## 2017-05-04 LAB — COMPREHENSIVE METABOLIC PANEL
ALBUMIN: 3.8 g/dL (ref 3.5–5.0)
ALT: 39 U/L (ref 17–63)
ANION GAP: 10 (ref 5–15)
AST: 42 U/L — ABNORMAL HIGH (ref 15–41)
Alkaline Phosphatase: 69 U/L (ref 38–126)
BILIRUBIN TOTAL: 0.7 mg/dL (ref 0.3–1.2)
BUN: 7 mg/dL (ref 6–20)
CO2: 24 mmol/L (ref 22–32)
Calcium: 9.3 mg/dL (ref 8.9–10.3)
Chloride: 98 mmol/L — ABNORMAL LOW (ref 101–111)
Creatinine, Ser: 0.67 mg/dL (ref 0.61–1.24)
GFR calc Af Amer: >60 mL/min (ref >60)
GFR calc non Af Amer: >60 mL/min (ref >60)
Glucose, Bld: 272 mg/dL — ABNORMAL HIGH (ref 65–99)
POTASSIUM: 4 mmol/L (ref 3.5–5.1)
SODIUM: 132 mmol/L — AB (ref 135–145)
TOTAL PROTEIN: 7.1 g/dL (ref 6.5–8.1)

## 2017-05-04 LAB — CBC WITH DIFFERENTIAL/PLATELET
BASOS PCT: 0 %
Basophils Absolute: 0 10*3/uL (ref 0.0–0.1)
EOS ABS: 0.1 10*3/uL (ref 0.0–0.7)
EOS PCT: 1 %
HCT: 40.4 % (ref 39.0–52.0)
Hemoglobin: 13.9 g/dL (ref 13.0–17.0)
Lymphocytes Relative: 30 %
Lymphs Abs: 1.8 10*3/uL (ref 0.7–4.0)
MCH: 27 pg (ref 26.0–34.0)
MCHC: 34.4 g/dL (ref 30.0–36.0)
MCV: 78.4 fL (ref 78.0–100.0)
MONO ABS: 0.6 10*3/uL (ref 0.1–1.0)
MONOS PCT: 10 %
Neutro Abs: 3.5 10*3/uL (ref 1.7–7.7)
Neutrophils Relative %: 59 %
PLATELETS: 243 10*3/uL (ref 150–400)
RBC: 5.15 MIL/uL (ref 4.22–5.81)
RDW: 14.2 % (ref 11.5–15.5)
WBC: 6 10*3/uL (ref 4.0–10.5)

## 2017-05-04 LAB — TROPONIN I: Troponin I: <0.03 ng/mL (ref <0.03)

## 2017-05-04 MED ORDER — ONDANSETRON HCL 4 MG/2ML IJ SOLN: 4.0000 mg | Freq: Four times a day (QID) | INTRAMUSCULAR | Status: DC | PRN

## 2017-05-04 MED ORDER — IRBESARTAN 300 MG PO TABS
150.0000 mg | ORAL_TABLET | Freq: Every day | ORAL | Status: DC
  Administered 2017-05-05 – 2017-05-06 (×3): 150 mg via ORAL
  Filled 2017-05-04 (×3): qty 1

## 2017-05-04 MED ORDER — NITROGLYCERIN 0.4 MG SL SUBL
0.4000 mg | SUBLINGUAL_TABLET | SUBLINGUAL | Status: AC | PRN
  Administered 2017-05-04 (×3): 0.4 mg via SUBLINGUAL
  Filled 2017-05-04: qty 1

## 2017-05-04 MED ORDER — LOSARTAN POTASSIUM 50 MG PO TABS
50.0000 mg | ORAL_TABLET | Freq: Every day | ORAL | Status: DC
  Filled 2017-05-04: qty 1

## 2017-05-04 MED ORDER — INSULIN DETEMIR 100 UNIT/ML ~~LOC~~ SOLN
35.0000 [IU] | Freq: Every day | SUBCUTANEOUS | Status: DC
  Administered 2017-05-05 – 2017-05-06 (×2): 35 [IU] via SUBCUTANEOUS
  Filled 2017-05-04 (×2): qty 0.35

## 2017-05-04 MED ORDER — ASPIRIN 81 MG PO CHEW
81.0000 mg | CHEWABLE_TABLET | Freq: Every day | ORAL | Status: DC
  Administered 2017-05-05 – 2017-05-06 (×2): 81 mg via ORAL
  Filled 2017-05-04 (×2): qty 1

## 2017-05-04 MED ORDER — ASPIRIN 81 MG PO CHEW
243.0000 mg | CHEWABLE_TABLET | Freq: Once | ORAL | Status: AC
  Administered 2017-05-04: 243 mg via ORAL
  Filled 2017-05-04: qty 3

## 2017-05-04 MED ORDER — INSULIN ASPART 100 UNIT/ML ~~LOC~~ SOLN
0.0000 [IU] | Freq: Every day | SUBCUTANEOUS | Status: DC
  Administered 2017-05-05: 4 [IU] via SUBCUTANEOUS
  Administered 2017-05-05: 3 [IU] via SUBCUTANEOUS

## 2017-05-04 MED ORDER — INSULIN DETEMIR 100 UNIT/ML ~~LOC~~ SOLN: 40.0000 [IU] | Freq: Two times a day (BID) | SUBCUTANEOUS | Status: DC

## 2017-05-04 MED ORDER — NEBIVOLOL HCL 5 MG PO TABS
5.0000 mg | ORAL_TABLET | Freq: Every day | ORAL | Status: DC
  Administered 2017-05-05 – 2017-05-06 (×2): 5 mg via ORAL
  Filled 2017-05-04 (×2): qty 1

## 2017-05-04 MED ORDER — AMLODIPINE BESYLATE 5 MG PO TABS
5.0000 mg | ORAL_TABLET | Freq: Every day | ORAL | Status: DC
  Administered 2017-05-05 – 2017-05-06 (×2): 5 mg via ORAL
  Filled 2017-05-04 (×2): qty 1

## 2017-05-04 MED ORDER — NITROGLYCERIN 0.4 MG SL SUBL: 0.4000 mg | SUBLINGUAL_TABLET | SUBLINGUAL | Status: DC | PRN

## 2017-05-04 MED ORDER — NITROGLYCERIN 2 % TD OINT
1.0000 [in_us] | TOPICAL_OINTMENT | Freq: Once | TRANSDERMAL | Status: AC
  Administered 2017-05-04: 1 [in_us] via TOPICAL
  Filled 2017-05-04: qty 1

## 2017-05-04 MED ORDER — ALLOPURINOL 100 MG PO TABS
100.0000 mg | ORAL_TABLET | Freq: Every day | ORAL | Status: DC
  Administered 2017-05-05 – 2017-05-06 (×2): 100 mg via ORAL
  Filled 2017-05-04 (×2): qty 1

## 2017-05-04 MED ORDER — INSULIN ASPART 100 UNIT/ML ~~LOC~~ SOLN
0.0000 [IU] | Freq: Three times a day (TID) | SUBCUTANEOUS | Status: DC
  Administered 2017-05-05 (×2): 20 [IU] via SUBCUTANEOUS
  Administered 2017-05-05: 7 [IU] via SUBCUTANEOUS
  Administered 2017-05-06 (×2): 11 [IU] via SUBCUTANEOUS
  Administered 2017-05-06: 15 [IU] via SUBCUTANEOUS

## 2017-05-04 MED ORDER — ENOXAPARIN SODIUM 40 MG/0.4ML ~~LOC~~ SOLN
40.0000 mg | SUBCUTANEOUS | Status: DC
  Administered 2017-05-04 – 2017-05-05 (×2): 40 mg via SUBCUTANEOUS
  Filled 2017-05-04 (×2): qty 0.4

## 2017-05-04 MED ORDER — ACETAMINOPHEN 325 MG PO TABS
650.0000 mg | ORAL_TABLET | ORAL | Status: DC | PRN
  Administered 2017-05-04: 650 mg via ORAL
  Filled 2017-05-04: qty 2

## 2017-05-04 MED ORDER — INSULIN DETEMIR 100 UNIT/ML ~~LOC~~ SOLN
40.0000 [IU] | Freq: Every day | SUBCUTANEOUS | Status: DC
  Administered 2017-05-04 – 2017-05-05 (×2): 40 [IU] via SUBCUTANEOUS
  Filled 2017-05-04 (×3): qty 0.4

## 2017-05-04 MED ORDER — PANTOPRAZOLE SODIUM 40 MG PO TBEC
40.0000 mg | DELAYED_RELEASE_TABLET | Freq: Every day | ORAL | Status: DC
  Administered 2017-05-05 – 2017-05-06 (×2): 40 mg via ORAL
  Filled 2017-05-04 (×2): qty 1

## 2017-05-04 MED ORDER — ROSUVASTATIN CALCIUM 40 MG PO TABS
40.0000 mg | ORAL_TABLET | Freq: Every day | ORAL | Status: DC
  Administered 2017-05-04 – 2017-05-05 (×2): 40 mg via ORAL
  Filled 2017-05-04 (×2): qty 1

## 2017-05-04 NOTE — ED Provider Notes (Signed)
[facility] Provider Note   CSN: [id number] Arrival date & time: 05/04/17  1447  By signing my name below, I, Aaron Franco, attest that this documentation has been prepared under the direction and in the presence of Orlie Dakin, MD. Electronically signed, Aaron Franco, ED Scribe. 05/04/17. 4:09 PM.  History   Chief Complaint Chief Complaint  Patient presents with  . Chest Pain    HPI HPI Comments: Aaron Franco is a 46 y.o. male, with Hx of DM, HTN, HLD, who presents to the Emergency Department complaining of intermittent chest pain that started last night around 2300. Pt was just returning home from [place] while visiting his mother. He states that he started to have waxing and waning chest pain on the way home yesterday. Pt took Nitroglycerin last night which helped. He also treated himself with Tums with partial relief Yesterday, he states that the pain would come on for about 30 minutes and them subside. He states that the pain would then return 30 minutes later. Today he woke up around 0700 with the same chest discomfort. The pain gradually worsened throughout the morning and has now become constant and unchanged. Pt describes the chest pain as 7/10 currently, not worsened when exerting himself. He last saw his Cardiologist 5 months ago, no problems. Wife notes that the pts blood sugar was 329 last night after eating a biscuit, no other problems with controlling his blood sugar recently. Pt has family Hx of heart attack when his father was 67. His mother had a stroke around the same age.   The history is provided by the patient and the spouse. No language interpreter was used.    Past Medical History:  Diagnosis Date  . CAD (coronary artery disease)    a. 05/2013 NSTEMI/PCI: mCFX 100%-> 3.0 mm  x 20 mm Promus premier DES, EF 65%;  01/2014 Cath: LM nl, LAD 20-30d, LCX patent distal stent, RCA 30-79m EF 55-65%.  . Diabetes mellitus   . Essential hypertension   . GERD  (gastroesophageal reflux disease)   . Gout   . History of echocardiogram    a. 03/2011 Echo: EF 65%, mild LVH, no rwma, mildly dil LA.  .Marland KitchenHyperlipidemia   . MI, old   . Obesity     Patient Active Problem List   Diagnosis Date Noted  . Chest pain 09/17/2015  . Coronary artery disease with unstable angina pectoris ([facility] 04/23/2015  . DM (diabetes mellitus), type 2, uncontrolled (HSimpson 04/23/2015  . CAD (coronary artery disease) 04/23/2015  . Thrush 04/23/2015  . Unstable angina (HDinwiddie 01/24/2014  . CAD S/P percutaneous coronary angioplasty - DES to LCx  01/24/2014  . Murmur 02/21/2011  . SINUSITIS - ACUTE-NOS 01/09/2011  . CHEST PAIN 12/27/2010  . EDEMA- LOCALIZED 12/24/2010  . SHOULDER PAIN 10/16/2010  . VITAMIN B12 DEFICIENCY 07/19/2010  . OBESITY 07/19/2010  . DECREASED LIBIDO 07/19/2010  . STRAIN, CHEST WALL 07/19/2010  . OTHER VITAMIN B12 DEFICIENCY ANEMIA 10/31/2009  . PAIN IN JOINT, HAND 10/16/2009  . TRIGGER FINGER 10/16/2009  . BACK STRAIN, LUMBAR 10/16/2009  . DM 08/17/2009  . RHINITIS 02/06/2009  . Hyperlipidemia 03/01/2007  . GOUT 03/01/2007  . Essential hypertension 03/01/2007  . GERD 03/01/2007    Past Surgical History:  Procedure Laterality Date  . BICEPS TENDON REPAIR  Nov 13 2010   right  . HAND SURGERY  1990   left  . LEFT HEART CATH N/A 05/12/2013   Procedure: LEFT HEART CATH;  Surgeon:  Sinclair Grooms, MD;  Location[facility] CATH LAB;  Service: Cardiovascular;  Laterality: N/A;  . LEFT HEART CATHETERIZATION WITH CORONARY ANGIOGRAM N/A 01/24/2014   Procedure: LEFT HEART CATHETERIZATION WITH CORONARY ANGIOGRAM;  Surgeon: Peter M Martinique, MD;  Location: [facility] CATH LAB;  Service: Cardiovascular;  Laterality: N/A;  . PERCUTANEOUS CORONARY STENT INTERVENTION (PCI-S)  05/2013   mCx 3.75m x 20 mm Promus P DES  . PERCUTANEOUS CORONARY STENT INTERVENTION (PCI-S)  05/12/2013   Procedure: PERCUTANEOUS CORONARY STENT INTERVENTION (PCI-S);  Surgeon: HSinclair Grooms MD;   [facility];  Service: Cardiovascular;;       Home Medications    Prior to Admission medications   Medication Sig Start Date End Date Taking? Authorizing Provider  allopurinol (ZYLOPRIM) 100 MG tablet Take 100 mg by mouth daily.    [provider]  aspirin 81 MG chewable tablet Chew 1 tablet (81 mg total) by mouth daily. 05/14/13   WRichardson DoppT, PA-C  colchicine 0.6 MG tablet Take 0.6 mg by mouth 2 (two) times daily. Then decrease to daily a symptoms allow    [provider]  CRESTOR 40 MG tablet Take 40 mg by mouth daily. 01/16/15   [provider]  dexlansoprazole (DEXILANT) 60 MG capsule Take 60 mg by mouth daily.    [provider]  glucose blood test strip Use as instructed 04/24/15   AReyne Dumas MD  glucose monitoring kit (FREESTYLE) monitoring kit 1 each by Does not apply route as needed for other. 04/24/15   AReyne Dumas MD  indomethacin (INDOCIN) 25 MG capsule Take 25-50 mg by mouth 3 (three) times daily. 03/19/15   [provider]  insulin detemir (LEVEMIR) 100 UNIT/ML injection 35 units in the morning and 40 units in the evening Patient taking differently: Inject 35 Units into the skin 2 (two) times daily. 35 units in the morning and 40 units in the evening 04/24/15   AReyne Dumas MD  Lancets (FREESTYLE) lancets TID , [facility]/14[facility]   AReyne Dumas MD  losartan (COZAAR) 50 MG tablet Take 50 mg by mouth daily. 04/18/15   [provider]  nebivolol (BYSTOLIC) 5 MG tablet Take 5 mg by mouth daily.    [provider]  sitaGLIPtin (JANUVIA) 100 MG tablet Take 1 tablet (100 mg total) by mouth daily. 04/27/12   TMidge Minium MD    Family History Family History  Problem Relation Age of Onset  . Heart attack Father        deceased  . Diabetes Father   . Congestive Heart Failure Father   . Stroke Mother   . Pancreatic cancer Maternal Aunt   . Colon cancer Neg Hx     Social History Social History    Substance Use Topics  . Smoking status: Former Smoker    Types: Cigarettes    Quit date: 11/10/1998  . Smokeless tobacco: Never Used     Comment: pt only smoked 1-2 cigs a day, not everyday on occs x 2 years  . Alcohol use Yes     Comment: rare     Allergies   Metformin and Prednisone   Review of Systems Review of Systems  Constitutional: Negative.   HENT: Negative.   Respiratory: Negative.   Cardiovascular: Positive for chest pain.  Gastrointestinal: Negative.   Musculoskeletal: Negative.   Skin: Negative.   Allergic/Immunologic: Positive for immunocompromised state.       Diabetic  Neurological: Negative.  Psychiatric/Behavioral: Negative.   All other systems reviewed and are negative.    Physical Exam Updated Vital Signs BP (!) 148/83 (BP Location: Right Arm)   Pulse 70   Temp 97.9 F (36.6 C)   Resp 16   Ht 6' (1.829 m)   Wt (!) 315 lb (142.9 kg)   SpO2 100%   BMI 42.72 kg/m   Physical Exam  Constitutional: He appears well-developed and well-nourished.  HENT:  Head: Normocephalic and atraumatic.  Eyes: Conjunctivae are normal. Pupils are equal, round, and reactive to light.  Neck: Neck supple. No tracheal deviation present. No thyromegaly present.  Cardiovascular: Normal rate and regular rhythm.   No murmur heard. Pulmonary/Chest: Effort normal and breath sounds normal.  Abdominal: Soft. Bowel sounds are normal. He exhibits no distension. There is no tenderness.  Obese  Musculoskeletal: Normal range of motion. He exhibits no edema or tenderness.  Neurological: He is alert. Coordination normal.  Skin: Skin is warm and dry. No rash noted.  Psychiatric: He has a normal mood and affect.  Nursing note and vitals reviewed.    ED Treatments / Results  DIAGNOSTIC STUDIES: Oxygen Saturation is 100% on RA, normal by my interpretation.  COORDINATION OF CARE: 4:00 PM-Discussed treatment plan with pt at bedside and pt agreed to plan.   Labs (all labs  ordered are listed, but only abnormal results are displayed) Labs Reviewed  CBC WITH DIFFERENTIAL/PLATELET  TROPONIN I  COMPREHENSIVE METABOLIC PANEL  Chest x-ray viewed by me Results for orders placed or performed during the hospital encounter of 05/04/17  Troponin I  Result Value Ref Range   Troponin I <0.03 <0.03 ng/mL  CBC with Differential  Result Value Ref Range   WBC 6.0 4.0 - 10.5 K/uL   RBC 5.15 4.22 - 5.81 MIL/uL   Hemoglobin 13.9 13.0 - 17.0 g/dL   HCT 40.4 39.0 - 52.0 %   MCV 78.4 78.0 - 100.0 fL   MCH 27.0 26.0 - 34.0 pg   MCHC 34.4 30.0 - 36.0 g/dL   RDW 14.2 11.5 - 15.5 %   Platelets 243 150 - 400 K/uL   Neutrophils Relative % 59 %   Neutro Abs 3.5 1.7 - 7.7 K/uL   Lymphocytes Relative 30 %   Lymphs Abs 1.8 0.7 - 4.0 K/uL   Monocytes Relative 10 %   Monocytes Absolute 0.6 0.1 - 1.0 K/uL   Eosinophils Relative 1 %   Eosinophils Absolute 0.1 0.0 - 0.7 K/uL   Basophils Relative 0 %   Basophils Absolute 0.0 0.0 - 0.1 K/uL  Comprehensive metabolic panel  Result Value Ref Range   Sodium 132 (L) 135 - 145 mmol/L   Potassium 4.0 3.5 - 5.1 mmol/L   Chloride 98 (L) 101 - 111 mmol/L   CO2 24 22 - 32 mmol/L   Glucose, Bld 272 (H) 65 - 99 mg/dL   BUN 7 6 - 20 mg/dL   Creatinine, Ser 0.67 0.61 - 1.24 mg/dL   Calcium 9.3 8.9 - 10.3 mg/dL   Total Protein 7.1 6.5 - 8.1 g/dL   Albumin 3.8 3.5 - 5.0 g/dL   AST 42 (H) 15 - 41 U/L   ALT 39 17 - 63 U/L   Alkaline Phosphatase 69 38 - 126 U/L   Total Bilirubin 0.7 0.3 - 1.2 mg/dL   GFR calc non Af Amer >60 >60 mL/min   GFR calc Af Amer >60 >60 mL/min   Anion gap 10 5 - 15  Dg Chest 2 View  Result Date: 05/04/2017 CLINICAL DATA:  Chest pain EXAM: CHEST  2 VIEW COMPARISON:  06/17/2016 FINDINGS: Heart and mediastinal contours are within normal limits. No focal opacities or effusions. No acute bony abnormality. IMPRESSION: No active cardiopulmonary disease. Electronically Signed   By: Rolm Baptise M.D.   On: 05/04/2017 15:12     Chest x-ray viewed by me EKG  EKG Interpretation  Date/Time:  Monday May 04 2017 14:54:11 EDT Ventricular Rate:  68 PR Interval:  158 QRS Duration: 94 QT Interval:  418 QTC Calculation: 444 R Axis:   20 Text Interpretation:  Normal sinus rhythm Nonspecific ST abnormality Abnormal ECG No significant change since last tracing Confirmed by Orlie Dakin [phone]) on 05/04/2017 2:57:53 PM       Radiology Dg Chest 2 View  Result Date: 05/04/2017 CLINICAL DATA:  Chest pain EXAM: CHEST  2 VIEW COMPARISON:  06/17/2016 FINDINGS: Heart and mediastinal contours are within normal limits. No focal opacities or effusions. No acute bony abnormality. IMPRESSION: No active cardiopulmonary disease. Electronically Signed   By: Rolm Baptise M.D.   On: 05/04/2017 15:12    Procedures Procedures (including critical care time)  Medications Ordered in ED Medications - No data to display   Initial Impression / Assessment and Plan / ED Course  I have reviewed the triage vital signs and the nursing notes.  Pertinent labs & imaging results that were available during my care of the patient were reviewed by me and considered in my medical decision making (see chart for details).    5:40 PM patient asymptomatic pain-free after treatment with 3 sublingual nitroglycerin tablets, aspirin 243 mg and nitroglycerin paste applied Heart score equals 4 6:10 PM patient remains asymptomatic, pain-free. Dr. Oval Linsey, cardiologist consulted. Excess patient in transfer to [facility]. Final Clinical Impressions(s) / ED Diagnoses  Diagnosis #1 chest pain arrest #2 hyperglycemia Final diagnoses:  None    New Prescriptions New Prescriptions   No medications on file  I personally performed the services described in this documentation, which was scribed in my presence. The recorded information has been reviewed and considered.    Orlie Dakin, MD 05/04/17 [phone]

## 2017-05-04 NOTE — ED Triage Notes (Addendum)
Pt c/o cp described as pressure  with SOB x 2 days, NO relief with OTC TUMS ext.., relief with NITRO

## 2017-05-04 NOTE — H&P (Addendum)
History and Physical  PCP: Lilian Coma., MD  Chief Complaint: Chest pain  HPI: Aaron Franco is a 46 yo man with history of STEMI in 2014 and PCI to LCx, diabetes, hypertension, hyperlipidemia, who is coming with chest pain that started yesterday when he was coming back from [place]. Patient reports feeling some discomfort in his chest yesterday in the evening after dinner, took some tums with mild improvement, pain came back in the evening, pressure 7/10 starting from right chest, he took nitro and it helped somewhat with pain and was able to fall asleep when came back home. Today keeps having on and off pressure that improved when went to ED and got nitro patch, and other medications. Currently pain free. Reports some nausea, no vomiting. Denies having recently chest pains, dyspnea, or palpitations otherwise. He has positive history for STEMI in 2014 and stent to LCx, repeat cath (2015), showed diffuse mild irregularities 20-30% in distal LAD, patent LCx stent, diffuse disease up to 30-40% in the mid RCA. He follows with cardiology in [place] but would prefer to change cardiologist to [facility]. Denies other recent workup. Reports quite poorly controlled hypertension.   Prior Studies  Past Medical History:  Diagnosis Date  . CAD (coronary artery disease)    a. 05/2013 NSTEMI/PCI: mCFX 100%-> 3.0 mm  x 20 mm Promus premier DES, EF 65%;  01/2014 Cath: LM nl, LAD 20-30d, LCX patent distal stent, RCA 30-11m EF 55-65%.  . Diabetes mellitus   . Essential hypertension   . GERD (gastroesophageal reflux disease)   . Gout   . History of echocardiogram    a. 03/2011 Echo: EF 65%, mild LVH, no rwma, mildly dil LA.  .Marland KitchenHyperlipidemia   . MI, old   . Obesity     Past Surgical History:  Procedure Laterality Date  . BICEPS TENDON REPAIR  Nov 13 2010   right  . HAND SURGERY  1990   left  . LEFT HEART CATH N/A 05/12/2013   Procedure: LEFT HEART CATH;  Surgeon: HSinclair Grooms MD;  [facility];   Service: Cardiovascular;  Laterality: N/A;  . LEFT HEART CATHETERIZATION WITH CORONARY ANGIOGRAM N/A 01/24/2014   Procedure: LEFT HEART CATHETERIZATION WITH CORONARY ANGIOGRAM;  Surgeon: Peter M JMartinique MD;  [facility];  Service: Cardiovascular;  Laterality: N/A;  . PERCUTANEOUS CORONARY STENT INTERVENTION (PCI-S)  05/2013   mCx 3.062mx 20 mm Promus P DES  . PERCUTANEOUS CORONARY STENT INTERVENTION (PCI-S)  05/12/2013   Procedure: PERCUTANEOUS CORONARY STENT INTERVENTION (PCI-S);  Surgeon: HeSinclair GroomsMD;  [facility];  Service: Cardiovascular;;    Family History  Problem Relation Age of Onset  . Heart attack Father        deceased  . Diabetes Father   . Congestive Heart Failure Father   . Stroke Mother   . Pancreatic cancer Maternal Aunt   . Colon cancer Neg Hx    Social History:  reports that he quit smoking about 18 years ago. His smoking use included Cigarettes. He has never used smokeless tobacco. He reports that he drinks alcohol. He reports that he does not use drugs.  Allergies:  Allergies  Allergen Reactions  . Influenza Vaccines Anaphylaxis    Reaction June 2017  . Fenofibrate Other (See Comments)    Hot flashes  . Metformin Other (See Comments)    Chest discomfort  . Prednisone Other (See Comments)    Mood swings  No current facility-administered medications on file prior to encounter.    Current Outpatient Prescriptions on File Prior to Encounter  Medication Sig Dispense Refill  . allopurinol (ZYLOPRIM) 100 MG tablet Take 100 mg by mouth daily.    Marland Kitchen aspirin 81 MG chewable tablet Chew 1 tablet (81 mg total) by mouth daily.    . colchicine 0.6 MG tablet Take 0.6 mg by mouth daily as needed (gout attacks).     Marland Kitchen dexlansoprazole (DEXILANT) 60 MG capsule Take 60 mg by mouth daily.    . indomethacin (INDOCIN) 25 MG capsule Take 25 mg by mouth 2 (two) times daily as needed (gout attack).   2  . insulin detemir (LEVEMIR) 100 UNIT/ML injection  35 units in the morning and 40 units in the evening (Patient taking differently: Inject 40 Units into the skin 2 (two) times daily. ) 10 mL 11  . losartan (COZAAR) 50 MG tablet Take 50 mg by mouth at bedtime.   3  . nebivolol (BYSTOLIC) 5 MG tablet Take 5 mg by mouth daily.    . sitaGLIPtin (JANUVIA) 100 MG tablet Take 1 tablet (100 mg total) by mouth daily. 30 tablet 0  . glucose blood test strip Use as instructed 100 each 12  . glucose monitoring kit (FREESTYLE) monitoring kit 1 each by Does not apply route as needed for other. 1 each 2  . Lancets (FREESTYLE) lancets TID , q AC 100 each 12    '@medshecduled' @ '@medinfusions' @  Results for orders placed or performed during the hospital encounter of 05/04/17 (from the past 48 hour(s))  Troponin I     Status: None   Collection Time: 05/04/17  3:16 PM  Result Value Ref Range   Troponin I <0.03 <0.03 ng/mL  CBC with Differential     Status: None   Collection Time: 05/04/17  3:16 PM  Result Value Ref Range   WBC 6.0 4.0 - 10.5 K/uL   RBC 5.15 4.22 - 5.81 MIL/uL   Hemoglobin 13.9 13.0 - 17.0 g/dL   HCT 40.4 39.0 - 52.0 %   MCV 78.4 78.0 - 100.0 fL   MCH 27.0 26.0 - 34.0 pg   MCHC 34.4 30.0 - 36.0 g/dL   RDW 14.2 11.5 - 15.5 %   Platelets 243 150 - 400 K/uL   Neutrophils Relative % 59 %   Neutro Abs 3.5 1.7 - 7.7 K/uL   Lymphocytes Relative 30 %   Lymphs Abs 1.8 0.7 - 4.0 K/uL   Monocytes Relative 10 %   Monocytes Absolute 0.6 0.1 - 1.0 K/uL   Eosinophils Relative 1 %   Eosinophils Absolute 0.1 0.0 - 0.7 K/uL   Basophils Relative 0 %   Basophils Absolute 0.0 0.0 - 0.1 K/uL  Comprehensive metabolic panel     Status: Abnormal   Collection Time: 05/04/17  3:16 PM  Result Value Ref Range   Sodium 132 (L) 135 - 145 mmol/L   Potassium 4.0 3.5 - 5.1 mmol/L   Chloride 98 (L) 101 - 111 mmol/L   CO2 24 22 - 32 mmol/L   Glucose, Bld 272 (H) 65 - 99 mg/dL   BUN 7 6 - 20 mg/dL   Creatinine, Ser 0.67 0.61 - 1.24 mg/dL   Calcium 9.3 8.9 -  10.3 mg/dL   Total Protein 7.1 6.5 - 8.1 g/dL   Albumin 3.8 3.5 - 5.0 g/dL   AST 42 (H) 15 - 41 U/L   ALT 39 17 - 63 U/L  Alkaline Phosphatase 69 38 - 126 U/L   Total Bilirubin 0.7 0.3 - 1.2 mg/dL   GFR calc non Af Amer >60 >60 mL/min   GFR calc Af Amer >60 >60 mL/min    Comment: (NOTE) The eGFR has been calculated using the CKD EPI equation. This calculation has not been validated in all clinical situations. eGFR's persistently <60 mL/min signify possible Chronic Kidney Disease.    Anion gap 10 5 - 15   Dg Chest 2 View  Result Date: 05/04/2017 CLINICAL DATA:  Chest pain EXAM: CHEST  2 VIEW COMPARISON:  06/17/2016 FINDINGS: Heart and mediastinal contours are within normal limits. No focal opacities or effusions. No acute bony abnormality. IMPRESSION: No active cardiopulmonary disease. Electronically Signed   By: Rolm Baptise M.D.   On: 05/04/2017 15:12   ECG/Tele: NSR, no acute changes  ROS: As above. Otherwise, review of systems is negative unless per above HPI  Vitals:   05/04/17 1800 05/04/17 1830 05/04/17 1900 05/04/17 2053  BP: 123/79 122/81 125/68 (!) 144/91  Pulse: 72 68 68 76  Resp: '16 20 14 16  ' Temp:    98.6 F (37 C)  TempSrc:    Oral  SpO2: 98% 98% 98% 99%  Weight:    (!) 139.9 kg (308 lb 8 oz)  Height:    6' (1.829 m)   Wt Readings from Last 10 Encounters:  05/04/17 (!) 139.9 kg (308 lb 8 oz)  09/17/15 (!) 145.9 kg (321 lb 9.6 oz)  04/24/15 (!) 143.3 kg (315 lb 14.4 oz)  01/24/14 (!) 149.6 kg (329 lb 14.4 oz)  05/12/13 (!) 142.9 kg (315 lb)  10/13/11 (!) 149.8 kg (330 lb 4.8 oz)  04/11/11 (!) 148.9 kg (328 lb 3.2 oz)  03/27/11 (!) 147.9 kg (326 lb)  03/17/11 (!) 149.7 kg (330 lb)  02/26/11 (!) 150.1 kg (331 lb)    PE:  General: No acute distress HEENT: Atraumatic, EOMI, mucous membranes moist CV: RRR no murmurs, gallops. No JVD. No HJR. Respiratory: Clear, no crackles. Normal work of breathing ABD: Non-distended and non-tender. No palpable  organomegaly.  Extremities: 2+ radial pulses bilaterally. No edema. Neuro/Psych: CN grossly intact, alert and oriented  Assessment/Plan 1. Chest pain - Some typical and atypical features. Currently pain free. ECG with no acute changes. Enzymes so far negative. Last cath in 2015 showing non-obstructive disease and patent LCx stent.   - Cycle enzymes, TSH, lipid panel - Echo - ASA, statin, beta blocker - Stress test tomorrow, unless enzymes turn positive or becomes symptomatic again  2. CAD - s/p STEMI 2014 and PCI to LCx - ASA, statin, beta blocker  3. Hypertension - On nebivolol 5 mg daily, losartan 50 mg daily. Will change losartan to irbsartan as he reports some chest discomfort after losartan. Reports cough after ACE-I.   - Will add amlodipine 5 mg daily  4. Hyperlipidemia - Lipid panel - Crestor 40 mg daily  4. Diabetes - Home Levemir - Sliding scale - Holding PO meds  Aaron Adela Lank  MD 05/04/2017, 11:10 PM

## 2017-05-05 ENCOUNTER — Inpatient Hospital Stay (HOSPITAL_COMMUNITY): Payer: BC Managed Care – PPO

## 2017-05-05 ENCOUNTER — Other Ambulatory Visit: Payer: Self-pay

## 2017-05-05 ENCOUNTER — Observation Stay (HOSPITAL_BASED_OUTPATIENT_CLINIC_OR_DEPARTMENT_OTHER): Payer: BC Managed Care – PPO

## 2017-05-05 DIAGNOSIS — R079 Chest pain, unspecified: Secondary | ICD-10-CM

## 2017-05-05 DIAGNOSIS — Z955 Presence of coronary angioplasty implant and graft: Secondary | ICD-10-CM | POA: Diagnosis not present

## 2017-05-05 DIAGNOSIS — E1165 Type 2 diabetes mellitus with hyperglycemia: Secondary | ICD-10-CM | POA: Diagnosis not present

## 2017-05-05 DIAGNOSIS — I2510 Atherosclerotic heart disease of native coronary artery without angina pectoris: Secondary | ICD-10-CM | POA: Diagnosis not present

## 2017-05-05 LAB — TROPONIN I
Troponin I: <0.03 ng/mL (ref <0.03)
Troponin I: <0.03 ng/mL (ref <0.03)

## 2017-05-05 LAB — CBC
HEMATOCRIT: 40.9 % (ref 39.0–52.0)
HEMOGLOBIN: 13.5 g/dL (ref 13.0–17.0)
MCH: 26.5 pg (ref 26.0–34.0)
MCHC: 33 g/dL (ref 30.0–36.0)
MCV: 80.4 fL (ref 78.0–100.0)
Platelets: 246 10*3/uL (ref 150–400)
RBC: 5.09 MIL/uL (ref 4.22–5.81)
RDW: 14 % (ref 11.5–15.5)
WBC: 6.6 10*3/uL (ref 4.0–10.5)

## 2017-05-05 LAB — COMPREHENSIVE METABOLIC PANEL
ALBUMIN: 3.4 g/dL — AB (ref 3.5–5.0)
ALK PHOS: 62 U/L (ref 38–126)
ALT: 35 U/L (ref 17–63)
ANION GAP: 8 (ref 5–15)
AST: 31 U/L (ref 15–41)
BILIRUBIN TOTAL: 0.7 mg/dL (ref 0.3–1.2)
BUN: 5 mg/dL — AB (ref 6–20)
CALCIUM: 9.1 mg/dL (ref 8.9–10.3)
CO2: 26 mmol/L (ref 22–32)
Chloride: 100 mmol/L — ABNORMAL LOW (ref 101–111)
Creatinine, Ser: 0.78 mg/dL (ref 0.61–1.24)
GFR calc Af Amer: >60 mL/min (ref >60)
GLUCOSE: 219 mg/dL — AB (ref 65–99)
Potassium: 3.8 mmol/L (ref 3.5–5.1)
Sodium: 134 mmol/L — ABNORMAL LOW (ref 135–145)
TOTAL PROTEIN: 6.5 g/dL (ref 6.5–8.1)

## 2017-05-05 LAB — BRAIN NATRIURETIC PEPTIDE: B Natriuretic Peptide: 13.8 pg/mL (ref 0.0–100.0)

## 2017-05-05 LAB — LIPID PANEL
Cholesterol: 292 mg/dL — ABNORMAL HIGH (ref 0–200)
LDL CALC: UNDETERMINED mg/dL (ref 0–99)
Triglycerides: 1362 mg/dL — ABNORMAL HIGH (ref <150)
VLDL: UNDETERMINED mg/dL (ref 0–40)

## 2017-05-05 LAB — GLUCOSE, CAPILLARY
GLUCOSE-CAPILLARY: 253 mg/dL — AB (ref 65–99)
GLUCOSE-CAPILLARY: 314 mg/dL — AB (ref 65–99)
GLUCOSE-CAPILLARY: 353 mg/dL — AB (ref 65–99)
Glucose-Capillary: 204 mg/dL — ABNORMAL HIGH (ref 65–99)
Glucose-Capillary: 352 mg/dL — ABNORMAL HIGH (ref 65–99)
Glucose-Capillary: 264 mg/dL — ABNORMAL HIGH (ref 65–99)

## 2017-05-05 LAB — ECHOCARDIOGRAM COMPLETE
Height: 72 in
WEIGHTICAEL: 4928 [oz_av]

## 2017-05-05 LAB — HIV ANTIBODY (ROUTINE TESTING W REFLEX): HIV SCREEN 4TH GENERATION: NONREACTIVE

## 2017-05-05 LAB — TSH: TSH: 1.854 u[IU]/mL (ref 0.350–4.500)

## 2017-05-05 MED ORDER — TECHNETIUM TC 99M TETROFOSMIN IV KIT
30.0000 | PACK | Freq: Once | INTRAVENOUS | Status: AC | PRN
  Administered 2017-05-05: 30 via INTRAVENOUS

## 2017-05-05 MED ORDER — REGADENOSON 0.4 MG/5ML IV SOLN
0.4000 mg | Freq: Once | INTRAVENOUS | Status: AC
  Administered 2017-05-05: 0.4 mg via INTRAVENOUS

## 2017-05-05 MED ORDER — NIACIN ER 500 MG PO CPCR
500.0000 mg | ORAL_CAPSULE | Freq: Every day | ORAL | Status: DC
  Administered 2017-05-05: 500 mg via ORAL
  Filled 2017-05-05 (×2): qty 1

## 2017-05-05 MED ORDER — REGADENOSON 0.4 MG/5ML IV SOLN
INTRAVENOUS | Status: AC
  Administered 2017-05-05: 0.4 mg via INTRAVENOUS
  Filled 2017-05-05: qty 5

## 2017-05-05 MED ORDER — OMEGA-3-ACID ETHYL ESTERS 1 G PO CAPS
1.0000 g | ORAL_CAPSULE | Freq: Two times a day (BID) | ORAL | Status: DC
  Administered 2017-05-05 – 2017-05-06 (×3): 1 g via ORAL
  Filled 2017-05-05 (×3): qty 1

## 2017-05-05 NOTE — Progress Notes (Signed)
  Echocardiogram 2D Echocardiogram has been performed.  Aaron Franco 05/05/2017, 4:11 PM

## 2017-05-05 NOTE — Progress Notes (Signed)
Inpatient Diabetes Program Recommendations  AACE/ADA: New Consensus Statement on Inpatient Glycemic Control (2015)  Target Ranges:  Prepandial:   less than 140 mg/dL      Peak postprandial:   less than 180 mg/dL (1-2 hours)      Critically ill patients:  140 - 180 mg/dL   Lab Results  Component Value Date   GLUCAP 204 (H) 05/05/2017   HGBA1C 11.8 (H) 09/18/2015    Review of Glycemic Control  Diabetes history: DM2 Outpatient Diabetes medications: Levemir 40 units bid, Januvia 100 mg QD Current orders for Inpatient glycemic control: Levemir 35 in am, 40 units QHS, Novolog 0-20 units tidwc and hs  Agree with orders. Non-compliant with taking blood sugar log to MD appt. Needs major weight loss. Diabetes Coordinator instructed on diabetes education 2 years ago. Has seen OP Diabetes CDE   Inpatient Diabetes Program Recommendations:    Check HgbA1C to assess glycemic control PTA  Follow.  Thank you. Ailene Ardshonda Ellsie Violette, RD, LDN, CDE Inpatient Diabetes Coordinator [phone]

## 2017-05-05 NOTE — Progress Notes (Signed)
   Stress portion of 2 day stress test was completed today. Resting images to be obtained tomorrow.  Brittainy Sharol HarnessSimmons 05/05/2017

## 2017-05-05 NOTE — Progress Notes (Signed)
Progress Note  Patient Name: Aaron Franco Date of Encounter: 05/05/2017  Primary Cardiologist:   New ([facility])  Subjective   He has had no further chest pain.  No SOB.   Inpatient Medications    Scheduled Meds: . allopurinol  100 mg Oral Daily  . amLODipine  5 mg Oral Daily  . aspirin  81 mg Oral Daily  . enoxaparin (LOVENOX) injection  40 mg Subcutaneous Q24H  . insulin aspart  0-20 Units Subcutaneous TID WC  . insulin aspart  0-5 Units Subcutaneous QHS  . insulin detemir  35 Units Subcutaneous Daily   And  . insulin detemir  40 Units Subcutaneous QHS  . irbesartan  150 mg Oral Daily  . nebivolol  5 mg Oral Daily  . pantoprazole  40 mg Oral Daily  . rosuvastatin  40 mg Oral QHS   Continuous Infusions:  PRN Meds: acetaminophen, nitroGLYCERIN, ondansetron (ZOFRAN) IV   Vital Signs    Vitals:   05/04/17 1830 05/04/17 1900 05/04/17 2053 05/05/17 0500  BP: 122/81 125/68 (!) 144/91 118/63  Pulse: 68 68 76 (!) 57  Resp: 20 14 16 16   Temp:   98.6 F (37 C) 97.5 F (36.4 C)  TempSrc:   Oral Oral  SpO2: 98% 98% 99% 95%  Weight:   (!) 308 lb 8 oz (139.9 kg) (!) 308 lb (139.7 kg)  Height:   6' (1.829 m)     Intake/Output Summary (Last 24 hours) at 05/05/17 0946 Last data filed at 05/05/17 0500  Gross per 24 hour  Intake              480 ml  Output              600 ml  Net             -120 ml   Filed Weights   05/04/17 1450 05/04/17 2053 05/05/17 0500  Weight: (!) 315 lb (142.9 kg) (!) 308 lb 8 oz (139.9 kg) (!) 308 lb (139.7 kg)    Telemetry    NSR - Personally Reviewed  ECG    NA - Personally Reviewed  Physical Exam   GEN: No acute distress.   Neck: No  JVD Cardiac: RRR, no murmurs, rubs, or gallops.  Respiratory: Clear  to auscultation bilaterally. GI: Soft, nontender, non-distended  MS: No  edema; No deformity. Neuro:  Nonfocal  Psych: Normal affect   Labs    Chemistry Recent Labs Lab 05/04/17 1516 05/05/17 0528  NA  132* 134*  K 4.0 3.8  CL 98* 100*  CO2 24 26  GLUCOSE 272* 219*  BUN 7 5*  CREATININE 0.67 0.78  CALCIUM 9.3 9.1  PROT 7.1 6.5  ALBUMIN 3.8 3.4*  AST 42* 31  ALT 39 35  ALKPHOS 69 62  BILITOT 0.7 0.7  GFRNONAA >60 >60  GFRAA >60 >60  ANIONGAP 10 8     Hematology Recent Labs Lab 05/04/17 1516 05/05/17 0528  WBC 6.0 6.6  RBC 5.15 5.09  HGB 13.9 13.5  HCT 40.4 40.9  MCV 78.4 80.4  MCH 27.0 26.5  MCHC 34.4 33.0  RDW 14.2 14.0  PLT 243 246    Cardiac Enzymes Recent Labs Lab 05/04/17 1516 05/05/17 0008 05/05/17 0528  TROPONINI <0.03 <0.03 <0.03   No results for input(s): TROPIPOC in the last 168 hours.   BNP Recent Labs Lab 05/05/17 0008  BNP 13.8     DDimer No results for input(s):  DDIMER in the last 168 hours.   Radiology    Dg Chest 2 View  Result Date: 05/04/2017 CLINICAL DATA:  Chest pain EXAM: CHEST  2 VIEW COMPARISON:  06/17/2016 FINDINGS: Heart and mediastinal contours are within normal limits. No focal opacities or effusions. No acute bony abnormality. IMPRESSION: No active cardiopulmonary disease. Electronically Signed   By: Charlett Nose M.D.   On: 05/04/2017 15:12    Cardiac Studies   NA  Patient Profile     46 y.o. male with history of STEMI in 2014 and PCI to LCx, diabetes, hypertension, hyperlipidemia, who presented with chest pain that started on the day before admission when he was coming back from [place].  Assessment & Plan    CHEST PAIN:   Enzymes are negative.  I will order an [employer].  No objective evidence of ischemia.  Symptoms are atypical but he has significant risk factors. Pretest probability is moderate.   HYPERTRIGLYCERIDEMIA:  1392.   He was apparently allergic to fenofibrate.   I will start Niacin and fish oil.  He needs dietary control and control of his diabetes.   DM:  No recent A1C that I can find.  However, it was not previously well controlled.   I will continue the meds as listed and he requests a new  endocrinologist.    HTN:   BP is well controlled.      Signed, Rollene Rotunda, MD  05/05/2017, 9:46 AM

## 2017-05-06 ENCOUNTER — Observation Stay (HOSPITAL_COMMUNITY): Payer: BC Managed Care – PPO

## 2017-05-06 ENCOUNTER — Observation Stay (HOSPITAL_BASED_OUTPATIENT_CLINIC_OR_DEPARTMENT_OTHER): Payer: BC Managed Care – PPO

## 2017-05-06 ENCOUNTER — Other Ambulatory Visit: Payer: Self-pay

## 2017-05-06 DIAGNOSIS — R079 Chest pain, unspecified: Secondary | ICD-10-CM

## 2017-05-06 DIAGNOSIS — E1165 Type 2 diabetes mellitus with hyperglycemia: Secondary | ICD-10-CM | POA: Diagnosis not present

## 2017-05-06 DIAGNOSIS — I2510 Atherosclerotic heart disease of native coronary artery without angina pectoris: Secondary | ICD-10-CM | POA: Diagnosis not present

## 2017-05-06 DIAGNOSIS — Z955 Presence of coronary angioplasty implant and graft: Secondary | ICD-10-CM | POA: Diagnosis not present

## 2017-05-06 LAB — GLUCOSE, CAPILLARY
GLUCOSE-CAPILLARY: 282 mg/dL — AB (ref 65–99)
Glucose-Capillary: 301 mg/dL — ABNORMAL HIGH (ref 65–99)
Glucose-Capillary: 277 mg/dL — ABNORMAL HIGH (ref 65–99)

## 2017-05-06 LAB — NM MYOCAR MULTI W/SPECT W/WALL MOTION / EF
CHL CUP RESTING HR STRESS: 80 {beats}/min
CSEPEDS: 28 s
CSEPHR: 61 %
Estimated workload: 1 METS
Exercise duration (min): 5 min
MPHR: 174 {beats}/min
Peak HR: 107 {beats}/min

## 2017-05-06 MED ORDER — INSULIN DETEMIR 100 UNIT/ML ~~LOC~~ SOLN: 40.0000 [IU] | Freq: Two times a day (BID) | SUBCUTANEOUS | Status: DC

## 2017-05-06 MED ORDER — OMEGA-3-ACID ETHYL ESTERS 1 G PO CAPS: 1.0000 g | ORAL_CAPSULE | Freq: Two times a day (BID) | ORAL | 3 refills | Status: DC

## 2017-05-06 MED ORDER — TECHNETIUM TC 99M TETROFOSMIN IV KIT: 30.0000 | PACK | Freq: Once | INTRAVENOUS | Status: DC | PRN

## 2017-05-06 MED ORDER — ACETAMINOPHEN 325 MG PO TABS: 650.0000 mg | ORAL_TABLET | ORAL | Status: AC | PRN

## 2017-05-06 MED ORDER — NITROGLYCERIN 0.4 MG SL SUBL: 0.4000 mg | SUBLINGUAL_TABLET | SUBLINGUAL | 2 refills | Status: DC | PRN

## 2017-05-06 MED ORDER — AMLODIPINE BESYLATE 5 MG PO TABS: 5.0000 mg | ORAL_TABLET | Freq: Every day | ORAL | 3 refills | Status: DC

## 2017-05-06 MED ORDER — TECHNETIUM TC 99M TETROFOSMIN IV KIT
30.0000 | PACK | Freq: Once | INTRAVENOUS | Status: AC | PRN
  Administered 2017-05-06: 30 via INTRAVENOUS

## 2017-05-06 MED ORDER — NIACIN ER 500 MG PO CPCR: 500.0000 mg | ORAL_CAPSULE | Freq: Every day | ORAL | 3 refills | Status: DC

## 2017-05-06 NOTE — Discharge Instructions (Signed)
Chest Wall Pain °Chest wall pain is pain in or around the bones and muscles of your chest. Sometimes, an injury causes this pain. Sometimes, the cause may not be known. This pain may take several weeks or longer to get better. °Follow these instructions at home: °Pay attention to any changes in your symptoms. Take these actions to help with your pain: °· Rest as told by your doctor. °· Avoid activities that cause pain. Try not to use your chest, belly (abdominal), or side muscles to lift heavy things. °· If directed, apply ice to the painful area: °? Put ice in a plastic bag. °? Place a towel between your skin and the bag. °? Leave the ice on for 20 minutes, 2-3 times per day. °· Take over-the-counter and prescription medicines only as told by your doctor. °· Do not use tobacco products, including cigarettes, chewing tobacco, and e-cigarettes. If you need help quitting, ask your doctor. °· Keep all follow-up visits as told by your doctor. This is important. ° °Contact a doctor if: °· You have a fever. °· Your chest pain gets worse. °· You have new symptoms. °Get help right away if: °· You feel sick to your stomach (nauseous) or you throw up (vomit). °· You feel sweaty or light-headed. °· You have a cough with phlegm (sputum) or you cough up blood. °· You are short of breath. °This information is not intended to replace advice given to you by your health care provider. Make sure you discuss any questions you have with your health care provider. °Document Released: 04/14/2008 Document Revised: 04/03/2016 Document Reviewed: 01/22/2015 °Elsevier Interactive Patient Education © 2018 [employer] Inc. ° °

## 2017-05-06 NOTE — Progress Notes (Signed)
Spoke with dietitian Jae DireKate, will come to see patient today.

## 2017-05-06 NOTE — Progress Notes (Signed)
Progress Note  Patient Name: Dreyton Roessner Date of Encounter: 05/06/2017  Primary Cardiologist:   New (Was seen at [facility])  Subjective   No chest pain.  No SOB.   Inpatient Medications    Scheduled Meds: . allopurinol  100 mg Oral Daily  . amLODipine  5 mg Oral Daily  . aspirin  81 mg Oral Daily  . enoxaparin (LOVENOX) injection  40 mg Subcutaneous Q24H  . insulin aspart  0-20 Units Subcutaneous TID WC  . insulin aspart  0-5 Units Subcutaneous QHS  . insulin detemir  35 Units Subcutaneous Daily   And  . insulin detemir  40 Units Subcutaneous QHS  . irbesartan  150 mg Oral Daily  . nebivolol  5 mg Oral Daily  . niacin  500 mg Oral QHS  . omega-3 acid ethyl esters  1 g Oral BID  . pantoprazole  40 mg Oral Daily  . rosuvastatin  40 mg Oral QHS   Continuous Infusions:  PRN Meds: acetaminophen, nitroGLYCERIN, ondansetron (ZOFRAN) IV   Vital Signs    Vitals:   05/05/17 1234 05/05/17 1300 05/05/17 2020 05/06/17 0635  BP:  140/89 112/76 133/74  Pulse: 89 80 87 71  Resp:  18 17 18   Temp:  99 F (37.2 C) 98 F (36.7 C) 98.3 F (36.8 C)  TempSrc:  Oral Oral Oral  SpO2:   98% 97%  Weight:    (!) 308 lb (139.7 kg)  Height:        Intake/Output Summary (Last 24 hours) at 05/06/17 0759 Last data filed at 05/05/17 2200  Gross per 24 hour  Intake              800 ml  Output             1100 ml  Net             -300 ml   Filed Weights   05/04/17 2053 05/05/17 0500 05/06/17 0635  Weight: (!) 308 lb 8 oz (139.9 kg) (!) 308 lb (139.7 kg) (!) 308 lb (139.7 kg)    Telemetry    NSR- Personally Reviewed  ECG    NA - Personally Reviewed  Physical Exam   GEN: No  acute distress.   Neck: No  JVD Cardiac:  RRR, no murmurs, rubs, or gallops.  Respiratory: Clear   to auscultation bilaterally. GI: Soft, nontender, non-distended, normal bowel sounds  MS:  No edema; No deformity. Neuro:   Nonfocal  Psych: Oriented and appropriate    Labs     Chemistry  Recent Labs Lab 05/04/17 1516 05/05/17 0528  NA 132* 134*  K 4.0 3.8  CL 98* 100*  CO2 24 26  GLUCOSE 272* 219*  BUN 7 5*  CREATININE 0.67 0.78  CALCIUM 9.3 9.1  PROT 7.1 6.5  ALBUMIN 3.8 3.4*  AST 42* 31  ALT 39 35  ALKPHOS 69 62  BILITOT 0.7 0.7  GFRNONAA >60 >60  GFRAA >60 >60  ANIONGAP 10 8     Hematology  Recent Labs Lab 05/04/17 1516 05/05/17 0528  WBC 6.0 6.6  RBC 5.15 5.09  HGB 13.9 13.5  HCT 40.4 40.9  MCV 78.4 80.4  MCH 27.0 26.5  MCHC 34.4 33.0  RDW 14.2 14.0  PLT 243 246    Cardiac Enzymes  Recent Labs Lab 05/04/17 1516 05/05/17 0008 05/05/17 0528 05/05/17 1441  TROPONINI <0.03 <0.03 <0.03 <0.03   No results for input(s): TROPIPOC in the last 168  hours.   BNP  Recent Labs Lab 05/05/17 0008  BNP 13.8     DDimer No results for input(s): DDIMER in the last 168 hours.   Radiology    Dg Chest 2 View  Result Date: 05/04/2017 CLINICAL DATA:  Chest pain EXAM: CHEST  2 VIEW COMPARISON:  06/17/2016 FINDINGS: Heart and mediastinal contours are within normal limits. No focal opacities or effusions. No acute bony abnormality. IMPRESSION: No active cardiopulmonary disease. Electronically Signed   By: Charlett NoseKevin  Dover M.D.   On: 05/04/2017 15:12    Cardiac Studies   ECHO  Study Conclusions  - Left ventricle: The cavity size was normal. Wall thickness was   increased in a pattern of moderate LVH. Systolic function was   normal. The estimated ejection fraction was in the range of 60%   to 65%. Wall motion was normal; there were no regional wall   motion abnormalities. Doppler parameters are consistent with   abnormal left ventricular relaxation (grade 1 diastolic   dysfunction). The E/e&' ratio is between 8-15, suggesting   indeterminate LV filling pressure. - Left atrium: The atrium was normal in size.  Impressions:  - Compared to a prior study in 2012, there are a few changes. There   is now moderate LVH, grade 1 DD and  indeterminate LV filling   pressure. The LVEF is stable at 60-65%.   Patient Profile     46 y.o. male with history of STEMI in 2014 and PCI to LCx, diabetes, hypertension, hyperlipidemia, who presented with chest pain that started on the day before admission when he was coming [place] from [place].  Assessment & Plan    CHEST PAIN:   Day two of stress test.  If negative home today.  EF OK on echo with LVH.    HYPERTRIGLYCERIDEMIA:  1392.  Started on Niacin and fish oil yesterday.  DM:  Plans per outpatient MD.  Needs a new patient appt with Dr. Sharl MaKerr if we can try to arrange this prior to discharge.  HYPERTENSIVE HEART DISEASE:   BP is well controlled.  Does have LVH on exam.  Continue BP therapy.   Follow up at [facility] with APP then with me probably six months later.   Signed, Rollene RotundaJames Takyia Sindt, MD  05/06/2017, 7:59 AM

## 2017-05-06 NOTE — Progress Notes (Signed)
Inpatient Diabetes Program Recommendations  AACE/ADA: New Consensus Statement on Inpatient Glycemic Control (2015)  Target Ranges:  Prepandial:   less than 140 mg/dL      Peak postprandial:   less than 180 mg/dL (1-2 hours)      Critically ill patients:  140 - 180 mg/dL   Lab Results  Component Value Date   GLUCAP 282 (H) 05/06/2017   HGBA1C 11.8 (H) 09/18/2015    Review of Glycemic ControlResults for [name], Aaron (MRN [id number]) as of 05/06/2017 13:02  Ref. Range 05/05/2017 16:56 05/05/2017 21:26 05/06/2017 07:29 05/06/2017 12:23  Glucose-Capillary Latest Ref Range: 65 - 99 mg/dL 401353 (H) 027264 (H) 253301 (H) 282 (H)   Diabetes history: Type 2 diabetes Outpatient Diabetes medications: Levemir 40 units bid, Januvia 100 mg daily Current orders for Inpatient glycemic control:  Levemir 35 units q AM and 40 units q PM, Novolog resistant tid with meals and HS  Inpatient Diabetes Program Recommendations:   Consider adding Novolog meal coverage 6 units tid with meals while in the hospital.  Also consider increasing Levemir to 42 units bid.    Thanks, Beryl MeagerJenny Regina Ganci, RN, BC-ADM Inpatient Diabetes Coordinator Pager [phone] (8a-5p)

## 2017-05-06 NOTE — Plan of Care (Signed)
Problem: Food- and Nutrition-Related Knowledge Deficit (NB-1.1) Goal: Nutrition education Formal process to instruct or train a patient/client in a skill or to impart knowledge to help patients/clients voluntarily manage or modify food choices and eating behavior to maintain or improve health. Outcome: Completed/Met Date Met: 05/06/17  RD consulted for nutrition education regarding diabetes and hypertriglyceridemia.   Lab Results  Component Value Date   HGBA1C 11.8 (H) 09/18/2015    RD provided "Carbohydrate Counting for People with Diabetes" handout as well as the "High Triglyceride Nutrition Therapy" handout from the Academy of Nutrition and Dietetics. Discussed different food groups and their effects on blood sugar, emphasizing carbohydrate-containing foods. Provided list of carbohydrates and recommended serving sizes of common foods.  Discussed importance of controlled and consistent carbohydrate intake throughout the day. Provided examples of ways to balance meals/snacks and encouraged intake of high-fiber, whole grain complex carbohydrates.   Reviewed heart healthy diet and reinforced importance of eating fiber-rich foods, choosing healthy fats (especially Omega 3s), limiting alcohol and added sugars, losing weight and exercise with regards to decreasing TG levels.  Teach back method used.  Expect good compliance.  Body mass index is 41.77 kg/m. Pt meets criteria for morbid obesity based on current BMI.  Current diet order is Carb Modified/Heart Healthy, patient is consuming approximately 100% of meals at this time. Labs and medications reviewed. No further nutrition interventions warranted at this time. RD contact information provided. If additional nutrition issues arise, please re-consult RD.  Kerman Passey MS, RD, LDN [phone] Pager  [phone] Weekend/On-Call Pager

## 2017-05-06 NOTE — Discharge Summary (Signed)
Discharge Summary    Patient ID: Aaron Franco,  MRN: [id number], DOB/AGE: 1971/07/29 46 y.o.  Admit date: 05/04/2017 Discharge date: 05/06/2017  Primary Care Provider: Malka SoJobe, Daniel B. Primary Cardiologist: Dr Antoine PocheHochrein  Discharge Diagnoses    Principal Problem:   Chest pain with moderate risk of acute coronary syndrome Active Problems:   Dyslipidemia   Essential hypertension   CAD S/P percutaneous coronary angioplasty -    Type 2 diabetes mellitus without complication, with long-term current use of insulin (HCC)   Allergies Allergies  Allergen Reactions  . Influenza Vaccines Anaphylaxis    Reaction June 2017  . Fenofibrate Other (See Comments)    Hot flashes  . Metformin Other (See Comments)    Chest discomfort  . Prednisone Other (See Comments)    Mood swings    Diagnostic Studies/Procedures    Two day Lexiscan 6/26-6/27/18 Echo 05/05/17  _____________   History of Present Illness     46 y/o with CAD, DM, and HTN, [facility], admitted 05/04/17 with chest pain worrisome for [place].   Hospital Course      46 yo man with history of STEMI in 2014 treated with PCI to LCx, IDDM, HTN, obesity, and hyperlipidemia, admitted with chest pain 05/04/17 that started 6/24 when [place] was coming back from [place]. He has positive history for STEMI in 2014 and stent to LCx, repeat cath (2015), showed diffuse mild irregularities 20-30% in distal LAD, patent LCx stent, diffuse disease up to 30-40% in the mid RCA. He had followed with cardiology in [place] but would [facility].   Pt ruled ou for an MI. Echo showed norm,al LVF with moderate LVH and grade 1 DD. EF 60-65%. He underwent two day Lexiscan ending 05/06/17 resulting as low risk.  _____________  Discharge Vitals Blood pressure (!) 138/94, pulse 71, temperature 98.3 F (36.8 C), temperature source Oral, resp. rate 18, height 6' (1.829 m), weight (!) 308 lb (139.7 kg), SpO2 97 %.    Filed Weights   05/04/17 2053 05/05/17 0500 05/06/17 0635  Weight: (!) 308 lb 8 oz (139.9 kg) (!) 308 lb (139.7 kg) (!) 308 lb (139.7 kg)    Labs & Radiologic Studies    CBC  Recent Labs  05/04/17 1516 05/05/17 0528  WBC 6.0 6.6  NEUTROABS 3.5  --   HGB 13.9 13.5  HCT 40.4 40.9  MCV 78.4 80.4  PLT 243 246   Basic Metabolic Panel  Recent Labs  05/04/17 1516 05/05/17 0528  NA 132* 134*  K 4.0 3.8  CL 98* 100*  CO2 24 26  GLUCOSE 272* 219*  BUN 7 5*  CREATININE 0.67 0.78  CALCIUM 9.3 9.1   Liver Function Tests  Recent Labs  05/04/17 1516 05/05/17 0528  AST 42* 31  ALT 39 35  ALKPHOS 69 62  BILITOT 0.7 0.7  PROT 7.1 6.5  ALBUMIN 3.8 3.4*   No results for input(s): LIPASE, AMYLASE in the last 72 hours. Cardiac Enzymes  Recent Labs  05/05/17 0008 05/05/17 0528 05/05/17 1441  TROPONINI <0.03 <0.03 <0.03   BNP Invalid input(s): POCBNP D-Dimer No results for input(s): DDIMER in the last 72 hours. Hemoglobin A1C No results for input(s): HGBA1C in the last 72 hours. Fasting Lipid Panel  Recent Labs  05/05/17 0008  CHOL 292*  HDL NOT REPORTED DUE TO HIGH TRIGLYCERIDES  LDLCALC UNABLE TO CALCULATE IF TRIGLYCERIDE OVER 400 mg/dL  TRIG 4,0981,362*  CHOLHDL NOT REPORTED  DUE TO HIGH TRIGLYCERIDES   Thyroid Function Tests  Recent Labs  05/05/17 0008  TSH 1.854   _____________  Dg Chest 2 View  Result Date: 05/04/2017 CLINICAL DATA:  Chest pain EXAM: CHEST  2 VIEW COMPARISON:  06/17/2016 FINDINGS: Heart and mediastinal contours are within normal limits. No focal opacities or effusions. No acute bony abnormality. IMPRESSION: No active cardiopulmonary disease. Electronically Signed   By: Charlett Nose M.D.   On: 05/04/2017 15:12   Disposition   Pt is being discharged home today in good condition.  Follow-up Plans & Appointments    Follow-up Information    Rollene Rotunda, MD Follow up.   Specialty:  Cardiology Why:  Office will contact you with  an appointment to see Dr Hochrein's APP in 1-2 weeks Contact information: [address] [place] [address] [phone]        Talmage Coin, MD Follow up.   Specialty:  Endocrinology Why:  office will contact you Contact information: [address] [place] [address] [phone]            Discharge Medications   Current Discharge Medication List    START taking these medications   Details  acetaminophen (TYLENOL) 325 MG tablet Take 2 tablets (650 mg total) by mouth every 4 (four) hours as needed for headache or mild pain.    amLODipine (NORVASC) 5 MG tablet Take 1 tablet (5 mg total) by mouth daily. Qty: 90 tablet, Refills: 3    niacin 500 MG CR capsule Take 1 capsule (500 mg total) by mouth at bedtime. Qty: 90 capsule, Refills: 3    nitroGLYCERIN (NITROSTAT) 0.4 MG SL tablet Place 1 tablet (0.4 mg total) under the tongue every 5 (five) minutes x 3 doses as needed for chest pain. Qty: 25 tablet, Refills: 2    omega-3 acid ethyl esters (LOVAZA) 1 g capsule Take 1 capsule (1 g total) by mouth 2 (two) times daily. Qty: 180 capsule, Refills: 3      CONTINUE these medications which have CHANGED   Details  insulin detemir (LEVEMIR) 100 UNIT/ML injection Inject 0.4 mLs (40 Units total) into the skin 2 (two) times daily.      CONTINUE these medications which have NOT CHANGED   Details  allopurinol (ZYLOPRIM) 100 MG tablet Take 100 mg by mouth daily.    aspirin 81 MG chewable tablet Chew 1 tablet (81 mg total) by mouth daily.    dexlansoprazole (DEXILANT) 60 MG capsule Take 60 mg by mouth daily.    losartan (COZAAR) 50 MG tablet Take 50 mg by mouth at bedtime.  Refills: 3    nebivolol (BYSTOLIC) 5 MG tablet Take 5 mg by mouth daily.    rosuvastatin (CRESTOR) 40 MG tablet Take 40 mg by mouth at bedtime.          Outstanding Labs/Studies    Duration of Discharge Encounter   Greater than 30 minutes including physician  time.  Jolene Provost PA 05/06/2017, 11:31 AM

## 2017-05-06 NOTE — Progress Notes (Signed)
Pt discharged per MD order, discharge instructions  given to patient with patient's understanding. Discharge in good condition.

## 2017-05-19 ENCOUNTER — Ambulatory Visit: Payer: BC Managed Care – PPO | Admitting: Cardiology

## 2017-12-01 DIAGNOSIS — B351 Tinea unguium: Secondary | ICD-10-CM | POA: Insufficient documentation

## 2017-12-14 DIAGNOSIS — R809 Proteinuria, unspecified: Secondary | ICD-10-CM

## 2017-12-14 DIAGNOSIS — E1129 Type 2 diabetes mellitus with other diabetic kidney complication: Secondary | ICD-10-CM

## 2017-12-14 HISTORY — DX: Proteinuria, unspecified: E11.29

## 2017-12-14 HISTORY — DX: Proteinuria, unspecified: R80.9

## 2018-06-02 ENCOUNTER — Telehealth: Payer: Self-pay | Admitting: Cardiology

## 2018-06-02 NOTE — Telephone Encounter (Signed)
New Message       . Patient is having heat fluttering and would has scheduled an appt on 08/16. Patient is nevous that the appt times is a little ways out and would  like a call back concerning the heart fluttering.

## 2018-06-02 NOTE — Telephone Encounter (Signed)
Agree 

## 2018-06-02 NOTE — Telephone Encounter (Signed)
Per pt has noted heart fluttering for 7 days off and on per pt was recently prescribed Trulicity but has been taking for 1 month  B/P today 138/85  P 69  Appt made with Theodore Demarkhonda Barrett PA on  06-08-18 at 11:00 am

## 2018-06-08 ENCOUNTER — Encounter: Payer: Self-pay | Admitting: Physician Assistant

## 2018-06-08 ENCOUNTER — Ambulatory Visit: Payer: BC Managed Care – PPO | Admitting: Physician Assistant

## 2018-06-08 VITALS — BP 152/80 | HR 71 | Ht 72.0 in | Wt 303.6 lb

## 2018-06-08 DIAGNOSIS — E782 Mixed hyperlipidemia: Secondary | ICD-10-CM | POA: Diagnosis not present

## 2018-06-08 DIAGNOSIS — I2510 Atherosclerotic heart disease of native coronary artery without angina pectoris: Secondary | ICD-10-CM

## 2018-06-08 DIAGNOSIS — E781 Pure hyperglyceridemia: Secondary | ICD-10-CM

## 2018-06-08 DIAGNOSIS — R002 Palpitations: Secondary | ICD-10-CM | POA: Diagnosis not present

## 2018-06-08 DIAGNOSIS — Z79899 Other long term (current) drug therapy: Secondary | ICD-10-CM | POA: Diagnosis not present

## 2018-06-08 DIAGNOSIS — I10 Essential (primary) hypertension: Secondary | ICD-10-CM | POA: Diagnosis not present

## 2018-06-08 NOTE — Progress Notes (Signed)
Cardiology Office Note   Date:  06/08/2018   ID:  Aaron Franco, DOB Mar 11, 1971, MRN [id number]  PCP:  Bailey MechPodraza, Cole Christopher, PA-C  Cardiologist: Dr. Antoine PocheHochrein, 05/06/2017 in hospital Theodore Demarkhonda Drisana Schweickert, PA-C   Chief Complaint  Patient presents with  . office visit    irrg heart beat x2 weeks ago, comes and goes, mentionsd SOB with episodes, denies swelling in hands/feet    History of Present Illness: Aaron Franco is a 47 y.o. male with a history of STEMI 2014 w/ DES CFX, non-obs dz by cath 2015, DM, HTN, HLD, GERD, obesity  04/2017, admitted for chest pain, Myoview low risk  Aaron Franco presents for cardiology follow up.   He was feeling good till about 2 weeks ago. He will get a feeling of heart palpitations. He will feel his heart slow down and speed up. Sx do not last long, but the slow HR makes him feel a little SOB.  Heart palpitations are not associated with exertion.  He has gotten this every day for about the last 2 weeks.   His BP is normally good, high today because he has not yet taken his meds.   DM is not well controlled. Last A1c was 13. He is following up with his MD soon.   He works 2nd shift at [employer] and first shift at [employer]. Struggles to have time to exercise.   He rarely gets chest pain. Has lost some weight and exertion is easier. He is feeling better with a lower weight. Is walking now when he can, this is not very often.  He is able to take the stairs at work and does well with this.  His blood pressure is elevated today but he says he has not had his medications.  He says normally it is well controlled.   Past Medical History:  Diagnosis Date  . CAD (coronary artery disease)    a. 05/2013 NSTEMI/PCI: mCFX 100%-> 3.0 mm  x 20 mm Promus premier DES, EF 65%;  01/2014 Cath: LM nl, LAD 20-30d, LCX patent distal stent, RCA 30-3753m, EF 55-65%.  . Diabetes mellitus   . Essential hypertension   . GERD (gastroesophageal reflux disease)     . Gout   . History of echocardiogram    a. 03/2011 Echo: EF 65%, mild LVH, no rwma, mildly dil LA.  Marland Kitchen. Hyperlipidemia   . MI, old   . Obesity     Past Surgical History:  Procedure Laterality Date  . BICEPS TENDON REPAIR  Nov 13 2010   right  . HAND SURGERY  1990   left  . LEFT HEART CATH N/A 05/12/2013   Procedure: LEFT HEART CATH;  Surgeon: Lesleigh NoeHenry W Smith III, MD;  [facility];  Service: Cardiovascular;  Laterality: N/A;  . LEFT HEART CATHETERIZATION WITH CORONARY ANGIOGRAM N/A 01/24/2014   Procedure: LEFT HEART CATHETERIZATION WITH CORONARY ANGIOGRAM;  Surgeon: Peter M SwazilandJordan, MD;  [facility];  Service: Cardiovascular;  Laterality: N/A;  . PERCUTANEOUS CORONARY STENT INTERVENTION (PCI-S)  05/2013   mCx 3.270mm x 20 mm Promus P DES  . PERCUTANEOUS CORONARY STENT INTERVENTION (PCI-S)  05/12/2013   Procedure: PERCUTANEOUS CORONARY STENT INTERVENTION (PCI-S);  Surgeon: Lesleigh NoeHenry W Smith III, MD;  [facility];  Service: Cardiovascular;;    Current Outpatient Medications  Medication Sig Dispense Refill  . acetaminophen (TYLENOL) 325 MG tablet Take 2 tablets (650 mg total) by mouth every 4 (four) hours as needed for  headache or mild pain.    Marland Kitchen allopurinol (ZYLOPRIM) 100 MG tablet Take 100 mg by mouth daily.    Marland Kitchen amLODipine (NORVASC) 5 MG tablet Take 1 tablet (5 mg total) by mouth daily. 90 tablet 3  . aspirin 81 MG chewable tablet Chew 1 tablet (81 mg total) by mouth daily.    Marland Kitchen dexlansoprazole (DEXILANT) 60 MG capsule Take 60 mg by mouth daily.    . Dulaglutide (TRULICITY) 0.75 MG/0.5ML SOPN Inject into the skin.    Marland Kitchen insulin detemir (LEVEMIR) 100 UNIT/ML injection Inject 0.4 mLs (40 Units total) into the skin 2 (two) times daily.    . nebivolol (BYSTOLIC) 5 MG tablet Take 5 mg by mouth daily.    . niacin 500 MG CR capsule Take 1 capsule (500 mg total) by mouth at bedtime. 90 capsule 3  . nitroGLYCERIN (NITROSTAT) 0.4 MG SL tablet Place 1 tablet (0.4 mg total) under  the tongue every 5 (five) minutes x 3 doses as needed for chest pain. 25 tablet 2  . omega-3 acid ethyl esters (LOVAZA) 1 g capsule Take 1 capsule (1 g total) by mouth 2 (two) times daily. 180 capsule 3  . rosuvastatin (CRESTOR) 40 MG tablet Take 40 mg by mouth at bedtime.    Marland Kitchen losartan (COZAAR) 50 MG tablet Take 50 mg by mouth at bedtime.   3   No current facility-administered medications for this visit.     Allergies:   Influenza vaccines; Fenofibrate; Metformin; and Prednisone    Social History:  The patient  reports that he quit smoking about 19 years ago. His smoking use included cigarettes. He has never used smokeless tobacco. He reports that he drinks alcohol. He reports that he does not use drugs.   Family History:  The patient's family history includes Congestive Heart Failure in his father; Diabetes in his father; Heart attack in his father; Pancreatic cancer in his maternal aunt; Stroke in his mother.    ROS:  Please see the history of present illness. All other systems are reviewed and negative.    PHYSICAL EXAM: VS:  BP (!) 152/80 (BP Location: Left Arm, Patient Position: Sitting)   Pulse 71   Ht 6' (1.829 m)   Wt (!) 303 lb 9.6 oz (137.7 kg)   BMI 41.18 kg/m  , BMI Body mass index is 41.18 kg/m. GEN: Well nourished, well developed, male in no acute distress  HEENT: normal for age  Neck: no JVD, no carotid bruit, no masses Cardiac: RRR; soft murmur, no rubs, or gallops Respiratory:  clear to auscultation bilaterally, normal work of breathing GI: soft, nontender, nondistended, + BS MS: no deformity or atrophy; no edema; distal pulses are 2+ in all 4 extremities   Skin: warm and dry, no rash Neuro:  Strength and sensation are intact Psych: euthymic mood, full affect   EKG:  EKG is ordered today. The ekg ordered today demonstrates sinus rhythm, heart rate 71, normal intervals, no acute ischemic changes, no change from 04/2017  ECHO, 04/2017  Study  Conclusions  - Left ventricle: The cavity size was normal. Wall thickness was increased in a pattern of moderate LVH. Systolic function was normal. The estimated ejection fraction was in the range of 60% to 65%. Wall motion was normal; there were no regional wall motion abnormalities. Doppler parameters are consistent with abnormal left ventricular relaxation (grade 1 diastolic dysfunction). The E/e&' ratio is between 8-15, suggesting indeterminate LV filling pressure. - Left atrium: The atrium was normal  in size.  Impressions:  - Compared to a prior study in 2012, there are a few changes. There is now moderate LVH, grade 1 DD and indeterminate LV filling pressure. The LVEF is stable at 60-65%.  MYOVIEW: 05/06/2017  There was no ST segment deviation noted during stress.  The study is normal.  This is a low risk study.  The left ventricular ejection fraction is normal (55-65%).   Normal resting and stress perfusion. No ischemia or infarction EF 55%   Recent Labs: No results found for requested labs within last 8760 hours.    Lipid Panel    Component Value Date/Time   CHOL 292 (H) 05/05/2017 0008   TRIG 1,362 (H) 05/05/2017 0008   HDL NOT REPORTED DUE TO HIGH TRIGLYCERIDES 05/05/2017 0008   CHOLHDL NOT REPORTED DUE TO HIGH TRIGLYCERIDES 05/05/2017 0008   VLDL UNABLE TO CALCULATE IF TRIGLYCERIDE OVER 400 mg/dL 95/62/1308 6578   LDLCALC UNABLE TO CALCULATE IF TRIGLYCERIDE OVER 400 mg/dL 46/96/2952 8413   LDLDIRECT 99.7 07/19/2010 1017     Wt Readings from Last 3 Encounters:  06/08/18 (!) 303 lb 9.6 oz (137.7 kg)  05/06/17 (!) 308 lb (139.7 kg)  09/17/15 (!) 321 lb 9.6 oz (145.9 kg)     Other studies Reviewed: Additional studies/ records that were reviewed today include: Office notes and testing.  ASSESSMENT AND PLAN:  1.  Palpitations: Continue Bystolic with no med changes for now.  Check an event monitor to try to determine what they are.  I  am suspicious that they are PVCs with compensatory pauses causing perception of bradycardia.  2.  CAD: He is on aspirin, beta-blocker, statin and ARB.  No ischemic symptoms.  Encouraged him to continue to increase his activity, 10-minute intervals are okay.  3.  Hypertension: His blood pressure is elevated today but he states he has not had his medications.  No med changes for now.  4.  Hyperlipidemia with hypertriglyceridemia: His diabetic control is improving so hopefully his triglycerides are improved as well.  Continue Crestor 40 mg and check a profile today.  He may need referral to the lipid clinic if improvement in his diabetes and diet do not improve his lipid profile sufficiently.   Current medicines are reviewed at length with the patient today.  The patient does not have concerns regarding medicines.  The following changes have been made:  no change  Labs/ tests ordered today include:   Orders Placed This Encounter  Procedures  . Lipid panel  . Comprehensive metabolic panel  . CARDIAC EVENT MONITOR  . EKG 12-Lead     Disposition:   FU with Dr. Antoine Poche  Signed, Theodore Demark, PA-C  06/08/2018 3:14 PM     [facility] Phone: [phone]; Fax: [phone]  This note was written with the assistance of speech recognition software. Please excuse any transcriptional errors.

## 2018-06-08 NOTE — Patient Instructions (Signed)
Medication Instructions: Your physician recommends that you continue on your current medications as directed.    If you need a refill on your cardiac medications before your next appointment, please call your pharmacy.   Labwork: Your physician recommends that you return for lab work in: Today ( CMP, Lipid)   Procedures/Testing: Your physician has recommended that you wear a 14 day event monitor. Event monitors are medical devices that record the heart's electrical activity. Doctors most often us these monitors to diagnose arrhythmias. Arrhythmias are problems with the speed or rhythm of the heartbeat. The monitor is a small, portable device. You can wear one while you do your normal daily activities. This is usually used to diagnose what is causing palpitations/syncope (passing out). [address]   Follow-Up: Your physician wants you to follow-up in 1 year with Dr. Antoine PocheHochrein. You will receive a reminder letter in the mail two months in advance. If you don't receive a letter, please call our office at [phone] to schedule this follow-up appointment.   Special Instructions: Monitor Blood pressure daily. Call office if consistently stay greater than 130/80.   Thank you for choosing [facility]!!

## 2018-06-09 LAB — COMPREHENSIVE METABOLIC PANEL
ALT: 30 IU/L (ref 0–44)
AST: 27 IU/L (ref 0–40)
Albumin/Globulin Ratio: 1.5 (ref 1.2–2.2)
Albumin: 4.1 g/dL (ref 3.5–5.5)
Alkaline Phosphatase: 83 IU/L (ref 39–117)
BUN/Creatinine Ratio: 16 (ref 9–20)
BUN: 11 mg/dL (ref 6–24)
Bilirubin Total: 0.2 mg/dL (ref 0.0–1.2)
CHLORIDE: 96 mmol/L (ref 96–106)
CO2: 18 mmol/L — AB (ref 20–29)
Calcium: 9.4 mg/dL (ref 8.7–10.2)
Creatinine, Ser: 0.68 mg/dL — ABNORMAL LOW (ref 0.76–1.27)
GFR, EST AFRICAN AMERICAN: 131 mL/min/{1.73_m2} (ref >59)
GFR, EST NON AFRICAN AMERICAN: 114 mL/min/{1.73_m2} (ref >59)
GLUCOSE: 302 mg/dL — AB (ref 65–99)
Globulin, Total: 2.7 g/dL (ref 1.5–4.5)
Potassium: 4.3 mmol/L (ref 3.5–5.2)
Sodium: 133 mmol/L — ABNORMAL LOW (ref 134–144)
Total Protein: 6.8 g/dL (ref 6.0–8.5)

## 2018-06-09 LAB — LIPID PANEL
CHOL/HDL RATIO: 7.8 ratio — AB (ref 0.0–5.0)
Cholesterol, Total: 195 mg/dL (ref 100–199)
HDL: 25 mg/dL — ABNORMAL LOW (ref >39)
Triglycerides: 647 mg/dL (ref 0–149)

## 2018-06-17 ENCOUNTER — Telehealth: Payer: Self-pay | Admitting: *Deleted

## 2018-06-17 NOTE — Telephone Encounter (Signed)
Left message for patient to call and schedule [facility]  [facility]

## 2018-06-25 ENCOUNTER — Ambulatory Visit: Payer: BC Managed Care – PPO | Admitting: Cardiology

## 2018-06-25 NOTE — Telephone Encounter (Signed)
Left message for patient to call and schedule LIPID appointment with Pharm D

## 2018-07-07 ENCOUNTER — Ambulatory Visit: Payer: BC Managed Care – PPO | Admitting: Physician Assistant

## 2018-07-07 NOTE — Progress Notes (Deleted)
Cardiology Office Note   Date:  07/07/2018   ID:  Aaron Franco, DOB 1970-11-19, MRN [id number]  PCP:  Bailey Mech, PA-C  Cardiologist: Dr. Antoine Poche, 05/06/2017 in hospital Aaron Demark, PA-C 06/08/2018  No chief complaint on file.   History of Present Illness: Aaron Franco is a 47 y.o. male with a history of STEMI 2014 w/ DES CFX, non-obs dz by cath 2015, DM, HTN, HLD, GERD, obesity  7/30 office visit, patient having problems with variations in his heart rate making him feel little short of breath.  Doing well with exercise, event monitor ordered but not performed  [name] presents for ***   Past Medical History:  Diagnosis Date  . CAD (coronary artery disease)    a. 05/2013 NSTEMI/PCI: mCFX 100%-> 3.0 mm  x 20 mm Promus premier DES, EF 65%;  01/2014 Cath: LM nl, LAD 20-30d, LCX patent distal stent, RCA 30-44m, EF 55-65%.  . Diabetes mellitus   . Essential hypertension   . GERD (gastroesophageal reflux disease)   . Gout   . History of echocardiogram    a. 03/2011 Echo: EF 65%, mild LVH, no rwma, mildly dil LA.  Marland Kitchen Hyperlipidemia   . MI, old   . Obesity     Past Surgical History:  Procedure Laterality Date  . BICEPS TENDON REPAIR  Nov 13 2010   right  . HAND SURGERY  1990   left  . LEFT HEART CATH N/A 05/12/2013   Procedure: LEFT HEART CATH;  Surgeon: Lesleigh Noe, MD;  Location: [facility] CATH LAB;  Service: Cardiovascular;  Laterality: N/A;  . LEFT HEART CATHETERIZATION WITH CORONARY ANGIOGRAM N/A 01/24/2014   Procedure: LEFT HEART CATHETERIZATION WITH CORONARY ANGIOGRAM;  Surgeon: Peter M Swaziland, MD;  Location: [facility] CATH LAB;  Service: Cardiovascular;  Laterality: N/A;  . PERCUTANEOUS CORONARY STENT INTERVENTION (PCI-S)  05/2013   mCx 3.57mm x 20 mm Promus P DES  . PERCUTANEOUS CORONARY STENT INTERVENTION (PCI-S)  05/12/2013   Procedure: PERCUTANEOUS CORONARY STENT INTERVENTION (PCI-S);  Surgeon: Lesleigh Noe, MD;  Location: [facility] CATH LAB;   Service: Cardiovascular;;    Current Outpatient Medications  Medication Sig Dispense Refill  . acetaminophen (TYLENOL) 325 MG tablet Take 2 tablets (650 mg total) by mouth every 4 (four) hours as needed for headache or mild pain.    Marland Kitchen allopurinol (ZYLOPRIM) 100 MG tablet Take 100 mg by mouth daily.    Marland Kitchen amLODipine (NORVASC) 5 MG tablet Take 1 tablet (5 mg total) by mouth daily. 90 tablet 3  . aspirin 81 MG chewable tablet Chew 1 tablet (81 mg total) by mouth daily.    Marland Kitchen dexlansoprazole (DEXILANT) 60 MG capsule Take 60 mg by mouth daily.    . Dulaglutide (TRULICITY) 0.75 MG/0.5ML SOPN Inject into the skin.    Marland Kitchen insulin detemir (LEVEMIR) 100 UNIT/ML injection Inject 0.4 mLs (40 Units total) into the skin 2 (two) times daily.    Marland Kitchen losartan (COZAAR) 50 MG tablet Take 50 mg by mouth at bedtime.   3  . nebivolol (BYSTOLIC) 5 MG tablet Take 5 mg by mouth daily.    . niacin 500 MG CR capsule Take 1 capsule (500 mg total) by mouth at bedtime. 90 capsule 3  . nitroGLYCERIN (NITROSTAT) 0.4 MG SL tablet Place 1 tablet (0.4 mg total) under the tongue every 5 (five) minutes x 3 doses as needed for chest pain. 25 tablet 2  . omega-3 acid ethyl esters (LOVAZA) 1 g  capsule Take 1 capsule (1 g total) by mouth 2 (two) times daily. 180 capsule 3  . rosuvastatin (CRESTOR) 40 MG tablet Take 40 mg by mouth at bedtime.     No current facility-administered medications for this visit.     Allergies:   Influenza vaccines; Fenofibrate; Metformin; and Prednisone    Social History:  The patient  reports that he quit smoking about 19 years ago. His smoking use included cigarettes. He has never used smokeless tobacco. He reports that he drinks alcohol. He reports that he does not use drugs.   Family History:  The patient's family history includes Congestive Heart Failure in his father; Diabetes in his father; Heart attack in his father; Pancreatic cancer in his maternal aunt; Stroke in his mother.    ROS:  Please see  the history of present illness. All other systems are reviewed and negative.    PHYSICAL EXAM: VS:  There were no vitals taken for this visit. , BMI There is no height or weight on file to calculate BMI. GEN: Well nourished, well developed, male in no acute distress  HEENT: normal for age  Neck: no JVD, no carotid bruit, no masses Cardiac: RRR; no murmur, no rubs, or gallops Respiratory:  clear to auscultation bilaterally, normal work of breathing GI: soft, nontender, nondistended, + BS MS: no deformity or atrophy; no edema; distal pulses are 2+ in all 4 extremities   Skin: warm and dry, no rash Neuro:  Strength and sensation are intact Psych: euthymic mood, full affect   EKG:  EKG {ACTION; IS/IS [id number]} ordered today. The ekg ordered today demonstrates ***  ECHO, 04/2017 Study Conclusions - Left ventricle: The cavity size was normal. Wall thickness was increased in a pattern of moderate LVH. Systolic function was normal. The estimated ejection fraction was in the range of 60% to 65%. Wall motion was normal; there were no regional wall motion abnormalities. Doppler parameters are consistent with abnormal left ventricular relaxation (grade 1 diastolic dysfunction). The E/e&' ratio is between 8-15, suggesting indeterminate LV filling pressure. - Left atrium: The atrium was normal in size. Impressions: - Compared to a prior study in 2012, there are a few changes. There is now moderate LVH, grade 1 DD and indeterminate LV filling pressure. The LVEF is stable at 60-65%.  MYOVIEW: 05/06/2017  There was no ST segment deviation noted during stress.  The study is normal.  This is a low risk study.  The left ventricular ejection fraction is normal (55-65%).  Normal resting and stress perfusion. No ischemia or infarction EF 55%   Recent Labs: 06/08/2018: ALT 30; BUN 11; Creatinine, Ser 0.68; Potassium 4.3; Sodium 133    Lipid Panel    Component  Value Date/Time   CHOL 195 06/08/2018 1213   TRIG 647 (HH) 06/08/2018 1213   HDL 25 (L) 06/08/2018 1213   CHOLHDL 7.8 (H) 06/08/2018 1213   CHOLHDL NOT REPORTED DUE TO HIGH TRIGLYCERIDES 05/05/2017 0008   VLDL UNABLE TO CALCULATE IF TRIGLYCERIDE OVER 400 mg/dL 29/56/213006/26/2018 [id number]   LDLCALC Comment 06/08/2018 1213   LDLDIRECT 99.7 07/19/2010 1017     Wt Readings from Last 3 Encounters:  06/08/18 (!) 303 lb 9.6 oz (137.7 kg)  05/06/17 (!) 308 lb (139.7 kg)  09/17/15 (!) 321 lb 9.6 oz (145.9 kg)     Other studies Reviewed: Additional studies/ records that were reviewed today include: ***.  ASSESSMENT AND PLAN:  1.  ***   Current medicines are reviewed at length with  the patient today.  The patient {ACTIONS; HAS/DOES NOT HAVE:19233} concerns regarding medicines.  The following changes have been made:  {PLAN; NO CHANGE:13088:s}  Labs/ tests ordered today include: *** No orders of the defined types were placed in this encounter.    Disposition:   FU with Dr. Antoine Poche  Signed, Aaron Demark, PA-C  07/07/2018 9:30 AM    [facility] Phone: [phone]; Fax: [phone]  This note was written with the assistance of speech recognition software. Please excuse any transcriptional errors.

## 2018-07-08 ENCOUNTER — Encounter: Payer: Self-pay | Admitting: *Deleted

## 2018-07-19 ENCOUNTER — Encounter: Payer: Self-pay | Admitting: Physician Assistant

## 2019-03-28 ENCOUNTER — Telehealth: Payer: Self-pay | Admitting: Cardiology

## 2019-03-28 NOTE — Progress Notes (Signed)
Virtual Visit via Video Note   This visit type was conducted due to national recommendations for restrictions regarding the COVID-19 Pandemic (e.g. social distancing) in an effort to limit this patient's exposure and mitigate transmission in our community.  Due to his co-morbid illnesses, this patient is at least at moderate risk for complications without adequate follow up.  This format is felt to be most appropriate for this patient at this time.  All issues noted in this document were discussed and addressed.  A limited physical exam was performed with this format.  Please refer to the patient's chart for his consent to telehealth for [facility].   Date:  03/29/2019   ID:  Aaron SetaAnthony Franco, DOB [birth year], MRN [id number]  Patient Location: Home Provider Location: Home  PCP:  Bailey MechPodraza, Cole Christopher, PA-C  Cardiologist:  Rollene RotundaJames Leola Fiore, MD  Electrophysiologist:  None   Evaluation Performed:  Follow-Up Visit  Chief Complaint:  CAD  History of Present Illness:    Aaron Franco is a 48 y.o. male for follow up of CAD.  He has a history of of STEMI 2014 w/ DES CFX.  Cath demonstrated non obstructive disease in 2015.  He had chest pain in 2018 and a negative perfusion study.   Since I last saw him he has done well.  He is off work because of COVID 19 and is not as active.  However, he has lost quite a bit of weight through diet.  His sugar is not well controlled but he is going to see an endocrinologist.  His blood pressures been well controlled.  The patient denies any new symptoms such as chest discomfort, neck or arm discomfort. There has been no new shortness of breath, PND or orthopnea. There have been no reported palpitations, presyncope or syncope.  The patient does not have symptoms concerning for COVID-19 infection (fever, chills, cough, or new shortness of breath).    Past Medical History:  Diagnosis Date  . CAD (coronary artery disease)    a. 05/2013 NSTEMI/PCI: mCFX  100%-> 3.0 mm  x 20 mm Promus premier DES, EF 65%;  01/2014 Cath: LM nl, LAD 20-30d, LCX patent distal stent, RCA 30-8157m, EF 55-65%.  . Diabetes mellitus   . Essential hypertension   . GERD (gastroesophageal reflux disease)   . Gout   . History of echocardiogram    a. 03/2011 Echo: EF 65%, mild LVH, no rwma, mildly dil LA.  Marland Kitchen. Hyperlipidemia   . MI, old   . Obesity    Past Surgical History:  Procedure Laterality Date  . BICEPS TENDON REPAIR  Nov 13 2010   right  . HAND SURGERY  1990   left  . LEFT HEART CATH N/A 05/12/2013   Procedure: LEFT HEART CATH;  Surgeon: Lesleigh NoeHenry W Smith III, MD;  [facility];  Service: Cardiovascular;  Laterality: N/A;  . LEFT HEART CATHETERIZATION WITH CORONARY ANGIOGRAM N/A 01/24/2014   Procedure: LEFT HEART CATHETERIZATION WITH CORONARY ANGIOGRAM;  Surgeon: Peter M SwazilandJordan, MD;  [facility];  Service: Cardiovascular;  Laterality: N/A;  . PERCUTANEOUS CORONARY STENT INTERVENTION (PCI-S)  05/2013   mCx 3.750mm x 20 mm Promus P DES  . PERCUTANEOUS CORONARY STENT INTERVENTION (PCI-S)  05/12/2013   Procedure: PERCUTANEOUS CORONARY STENT INTERVENTION (PCI-S);  Surgeon: Lesleigh NoeHenry W Smith III, MD;  [facility];  Service: Cardiovascular;;     Current Meds  Medication Sig  . acetaminophen (TYLENOL) 325 MG tablet Take 2 tablets (650 mg  total) by mouth every 4 (four) hours as needed for headache or mild pain.  Marland Kitchen allopurinol (ZYLOPRIM) 100 MG tablet Take 100 mg by mouth daily.  Marland Kitchen amLODipine (NORVASC) 5 MG tablet Take 1 tablet (5 mg total) by mouth daily.  Marland Kitchen aspirin 81 MG chewable tablet Chew 1 tablet (81 mg total) by mouth daily.  Marland Kitchen dexlansoprazole (DEXILANT) 60 MG capsule Take 60 mg by mouth daily.  . Dulaglutide (TRULICITY) 0.75 MG/0.5ML SOPN Inject into the skin.  . fluticasone (FLONASE) 50 MCG/ACT nasal spray Place into the nose.  . insulin detemir (LEVEMIR) 100 UNIT/ML injection Inject 0.4 mLs (40 Units total) into the skin 2 (two) times daily.  (Patient taking differently: Inject 50 Units into the skin 2 (two) times daily. )  . nebivolol (BYSTOLIC) 5 MG tablet Take 5 mg by mouth daily.  . nitroGLYCERIN (NITROSTAT) 0.4 MG SL tablet Place 1 tablet (0.4 mg total) under the tongue every 5 (five) minutes x 3 doses as needed for chest pain.  . Omega-3 Fatty Acids (FISH OIL) 1000 MG CAPS Take 1,000 mg by mouth 2 (two) times a day.  . rosuvastatin (CRESTOR) 40 MG tablet Take 40 mg by mouth at bedtime.  . [DISCONTINUED] losartan (COZAAR) 50 MG tablet Take 50 mg by mouth at bedtime.      Allergies:   Influenza vaccines; Fenofibrate; Metformin; and Prednisone   Social History   Tobacco Use  . Smoking status: Former Smoker    Types: Cigarettes    Last attempt to quit: 11/10/1998    Years since quitting: 20.3  . Smokeless tobacco: Never Used  . Tobacco comment: pt only smoked 1-2 cigs a day, not everyday on occs x 2 years  Substance Use Topics  . Alcohol use: Yes    Comment: rare  . Drug use: No     Family Hx: The patient's family history includes Congestive Heart Failure in his father; Diabetes in his father; Heart attack in his father; Pancreatic cancer in his maternal aunt; Stroke in his mother. There is no history of Colon cancer.  ROS:   Please see the history of present illness.    As stated in the HPI and negative for all other systems.   Prior CV studies:   The following studies were reviewed today:  None  Labs/Other Tests and Data Reviewed:    EKG:  No ECG reviewed.  Recent Labs: 06/08/2018: ALT 30; BUN 11; Creatinine, Ser 0.68; Potassium 4.3; Sodium 133   Recent Lipid Panel Lab Results  Component Value Date/Time   CHOL 195 06/08/2018 12:13 PM   TRIG 647 (HH) 06/08/2018 12:13 PM   HDL 25 (L) 06/08/2018 12:13 PM   CHOLHDL 7.8 (H) 06/08/2018 12:13 PM   CHOLHDL NOT REPORTED DUE TO HIGH TRIGLYCERIDES 05/05/2017 12:08 AM   LDLCALC Comment 06/08/2018 12:13 PM   LDLDIRECT 99.7 07/19/2010 10:17 AM    Wt Readings  from Last 3 Encounters:  03/29/19 270 lb (122.5 kg)  06/08/18 (!) 303 lb 9.6 oz (137.7 kg)  05/06/17 (!) 308 lb (139.7 kg)     Objective:    Vital Signs:  BP 118/76   Pulse 81   Ht 6' (1.829 m)   Wt 270 lb (122.5 kg)   BMI 36.62 kg/m    VITAL SIGNS:  reviewed GEN:  no acute distress EYES:  sclerae anicteric, EOMI - Extraocular Movements Intact RESPIRATORY:  normal respiratory effort, symmetric expansion NEURO:  alert and oriented x 3, no obvious focal deficit PSYCH:  normal affect  ASSESSMENT & PLAN:    CAD:   The patient has no new sypmtoms.  No further cardiovascular testing is indicated.  We will continue with aggressive risk reduction and meds as listed.  Hypertension:   The blood pressure is at target. No change in medications is indicated. We will continue with therapeutic lifestyle changes (TLC).  Hyperlipidemia with hypertriglyceridemia:    He is going to see an endocrinologist.  No change in therapy.  I will defer to her management.   Obesity:  I congratulated him on his weight loss  COVID-19 Education: The signs and symptoms of COVID-19 were discussed with the patient and how to seek care for testing (follow up with PCP or arrange E-visit).  The importance of social distancing was discussed today.  I did agree that I would consider writing a letter for him to stay out of work that [facility] for an extended period given his high risk  Time:   Today, I have spent 28 minutes with the patient with telehealth technology discussing the above problems.     Medication Adjustments/Labs and Tests Ordered: Current medicines are reviewed at length with the patient today.  Concerns regarding medicines are outlined above.   Tests Ordered: No orders of the defined types were placed in this encounter.   Medication Changes: No orders of the defined types were placed in this encounter.   Disposition:  Follow up with me in one year  Signed, Rollene Rotunda, MD  03/29/2019  12:09 PM    [facility]

## 2019-03-29 ENCOUNTER — Telehealth (INDEPENDENT_AMBULATORY_CARE_PROVIDER_SITE_OTHER): Payer: BC Managed Care – PPO | Admitting: Cardiology

## 2019-03-29 ENCOUNTER — Encounter: Payer: Self-pay | Admitting: Cardiology

## 2019-03-29 VITALS — BP 118/76 | HR 81 | Ht 72.0 in | Wt 270.0 lb

## 2019-03-29 DIAGNOSIS — Z7189 Other specified counseling: Secondary | ICD-10-CM | POA: Insufficient documentation

## 2019-03-29 DIAGNOSIS — I2510 Atherosclerotic heart disease of native coronary artery without angina pectoris: Secondary | ICD-10-CM | POA: Diagnosis not present

## 2019-03-29 DIAGNOSIS — I10 Essential (primary) hypertension: Secondary | ICD-10-CM

## 2019-03-29 DIAGNOSIS — E785 Hyperlipidemia, unspecified: Secondary | ICD-10-CM

## 2019-03-29 NOTE — Patient Instructions (Signed)

## 2019-04-12 ENCOUNTER — Telehealth: Payer: Self-pay | Admitting: Cardiology

## 2019-04-12 NOTE — Telephone Encounter (Signed)
New Message:     Please call, pt has a form that he needs to have filled out for work. This is in regards to COVID-19. Please call today, if possible please.

## 2019-04-12 NOTE — Telephone Encounter (Signed)
Called and spoke with wife- she states they are needing patients emergency FMLA forms filled out by Dr.Hochrein- they stated it was discussed at their telemedicine visit last week, and they are needing it completed if possible. I advised his next DOD in office day was June 11th, but would route to MD and assistant to see if anything else needed to be done before then, or if the paperwork could be filled out at all.  Patient wife verbalized understanding.

## 2019-04-13 NOTE — Telephone Encounter (Signed)
I will be in the office on the 11th.

## 2019-04-14 NOTE — Telephone Encounter (Signed)
Call and spoke with Aaron Franco, to see if he can drop off FMLA paperwork next Thursday to the office, pt stated he will drop paperwork off that day, pt voice thanks for call back

## 2019-05-10 NOTE — Telephone Encounter (Signed)
Opened in error

## 2019-10-17 ENCOUNTER — Other Ambulatory Visit: Payer: Self-pay

## 2019-10-18 ENCOUNTER — Ambulatory Visit: Payer: BC Managed Care – PPO | Admitting: Family Medicine

## 2019-11-18 ENCOUNTER — Ambulatory Visit: Payer: BC Managed Care – PPO | Admitting: Family Medicine

## 2019-11-28 ENCOUNTER — Other Ambulatory Visit: Payer: Self-pay

## 2019-11-28 ENCOUNTER — Ambulatory Visit (INDEPENDENT_AMBULATORY_CARE_PROVIDER_SITE_OTHER): Payer: BC Managed Care – PPO | Admitting: Family Medicine

## 2019-11-28 ENCOUNTER — Encounter: Payer: Self-pay | Admitting: Family Medicine

## 2019-11-28 VITALS — BP 140/78 | HR 88 | Temp 98.8°F | Ht 73.0 in | Wt 289.4 lb

## 2019-11-28 DIAGNOSIS — I10 Essential (primary) hypertension: Secondary | ICD-10-CM | POA: Diagnosis not present

## 2019-11-28 DIAGNOSIS — Z8739 Personal history of other diseases of the musculoskeletal system and connective tissue: Secondary | ICD-10-CM

## 2019-11-28 DIAGNOSIS — I2510 Atherosclerotic heart disease of native coronary artery without angina pectoris: Secondary | ICD-10-CM

## 2019-11-28 DIAGNOSIS — E1165 Type 2 diabetes mellitus with hyperglycemia: Secondary | ICD-10-CM

## 2019-11-28 DIAGNOSIS — Z794 Long term (current) use of insulin: Secondary | ICD-10-CM

## 2019-11-28 DIAGNOSIS — K219 Gastro-esophageal reflux disease without esophagitis: Secondary | ICD-10-CM

## 2019-11-28 NOTE — Patient Instructions (Signed)
Preventing Disease Through Immunization Immunization means developing a lower risk of getting a disease due to improvements in the body's disease-fighting system (immune system). Immunization can happen through:  Natural exposure to a disease.  Getting shots (vaccination). Vaccination involves putting a small amount of germs (vaccines) into the body. This may be done through one or more shots. Some vaccines can be given by mouth or as a nasal spray, instead of a shot. Vaccination helps to prevent:  Serious diseases such as polio, measles, and whooping cough.  Common infections, such as the flu. Vaccination starts at birth. Teens and adults also need vaccines regularly. Talk with your health care provider about the immunization schedule that is best for you. Some vaccines need to be repeated when you are older. How does immunization prevent disease? Immunization occurs when the body is exposed to germs that cause a certain disease. The body responds to this exposure by forming proteins (antibodies) to fight those germs. Germs in vaccines are dead or very weak, so they will not make you sick. However, the antibodies that your body makes will stay in your body for a long time. This improves the ability of your immune system to fight the germs in the future. If you get exposed to the germs again, you may be able to resist them (develop immunity against them). This is because your antibodies may be able to destroy the germs before you get sick. Why should I prevent diseases through immunization? Vaccines can protect you from getting diseases that can cause harmful complications and even death. Getting vaccinated also helps to keep other people healthy. If you are vaccinated, you cannot spread disease to others, and that can make the disease become less common. If people keep getting vaccinated, certain diseases may become rare or go away. If people stop getting vaccinated, certain diseases could become  more common. Not everyone can get a vaccine. Very young babies, people who are very sick, or older people may not be able to get vaccines. By getting immunized, you help to protect people who are not able to be vaccinated. Where to find more information To learn more about immunization, visit:  World Health Organization: www.who.int/topics/immunization/en  Centers for Disease Control and Prevention: www.cdc.gov/vaccines/index.html Summary  Immunization occurs when the body is exposed to germs that cause a certain disease and responds by forming proteins (antibodies) to fight those germs.  Getting vaccines is a safe and effective way to develop immunity against specific germs and the diseases that they cause.  Talk with your health care provider about your immunization schedule, and stay up to date with all of your shots. This information is not intended to replace advice given to you by your health care provider. Make sure you discuss any questions you have with your health care provider. Document Revised: 02/18/2019 Document Reviewed: 07/05/2016 Elsevier Patient Education  2020 Elsevier Inc.  

## 2019-11-28 NOTE — Progress Notes (Signed)
New Patient Office Visit  Subjective:  Patient ID: Aaron Franco, male    DOB: October 13, 1971  Age: 49 y.o. MRN: [id number]  CC:  Chief Complaint  Patient presents with  . Establish Care    new pt, referral to endo    HPI Aaron Franco presents for establishment for primary care.  Significant past medical history to include type 2 diabetes dependent on insulin and other medications, coronary artery disease with hypertension history of gout, GERD and morbid obesity.  Currently seeing an endocrinologist in [facility] in like to start seeing 1 in [place].  He would like to start seeing a podiatrist as well.  Currently seeing cardiology for follow-up of his coronary artery disease, hypertension and hyperlipidemia.  He did see an ophthalmologist this past year and had no retinopathy he tells me.  He sees the dentist twice yearly.  He has been exercising and moderating his diet with significant weight loss in the last few years.  Feels much better.  He rarely only drinks alcohol.  Does not smoke or use illicit drugs.  He lives with his wife.  Past Medical History:  Diagnosis Date  . CAD (coronary artery disease)    a. 05/2013 NSTEMI/PCI: mCFX 100%-> 3.0 mm  x 20 mm Promus premier DES, EF 65%;  01/2014 Cath: LM nl, LAD 20-30d, LCX patent distal stent, RCA 30-36m, EF 55-65%.  . Diabetes mellitus   . Essential hypertension   . GERD (gastroesophageal reflux disease)   . Gout   . History of echocardiogram    a. 03/2011 Echo: EF 65%, mild LVH, no rwma, mildly dil LA.  Marland Kitchen Hyperlipidemia   . MI, old   . Obesity     Past Surgical History:  Procedure Laterality Date  . BICEPS TENDON REPAIR  Nov 13 2010   right  . HAND SURGERY  1990   left  . LEFT HEART CATH N/A 05/12/2013   Procedure: LEFT HEART CATH;  Surgeon: Sinclair Grooms, MD;  Location: [facility] CATH LAB;  Service: Cardiovascular;  Laterality: N/A;  . LEFT HEART CATHETERIZATION WITH CORONARY ANGIOGRAM N/A 01/24/2014   Procedure: LEFT HEART  CATHETERIZATION WITH CORONARY ANGIOGRAM;  Surgeon: Peter M Martinique, MD;  Location: [facility] C[facility] LAB;  Service: Cardiovascular;  Laterality: N/A;  . PERCUTANEOUS CORONARY STENT INTERVENTION (PCI-S)  05/2013   mCx 3.46mm x 20 mm Promus P DES  . PERCUTANEOUS CORONARY STENT INTERVENTION (PCI-S)  05/12/2013   Procedure: PERCUTANEOUS CORONARY STENT INTERVENTION (PCI-S);  Surgeon: Sinclair Grooms, MD;  Location: [facility] CATH LAB;  Service: Cardiovascular;;    Family History  Problem Relation Age of Onset  . Heart attack Father        deceased  . Diabetes Father   . Congestive Heart Failure Father   . Stroke Mother   . Pancreatic cancer Maternal Aunt   . Colon cancer Neg Hx     Social History   Socioeconomic History  . Marital status: Married    Spouse name: Not on file  . Number of children: 2  . Years of education: Not on file  . Highest education level: Not on file  Occupational History  . Occupation: Architectural technologist: [employer]  . 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 Smoking status: Former Smoker    Types: Cigarettes    Quit date: 11/10/1998    Years since quitting: 21.0  . Smokeless tobacco: Never Used  .  Tobacco comment: pt only smoked 1-2 cigs a day, not everyday on occs x 2 years  Substance and Sexual Activity  . Alcohol use: Yes    Comment: rare  . Drug use: No  . Sexual activity: Yes  Other Topics Concern  . Not on file  Social History Narrative  . Not on file   Social Determinants of Health   Financial Resource Strain:   . Difficulty of Paying Living Expenses: Not on file  Food Insecurity:   . Worried About Programme researcher, broadcasting/film/video in the Last Year: Not on file  . Ran Out of Food in the Last Year: Not on file  Transportation Needs:   . Lack of Transportation (Medical): Not on file  . Lack of Transportation (Non-Medical): Not on file  Physical Activity:   . Days of Exercise per Week: Not on file  . Minutes of Exercise per  Session: Not on file  Stress:   . Feeling of Stress : Not on file  Social Connections:   . Frequency of Communication with Friends and Family: Not on file  . Frequency of Social Gatherings with Friends and Family: Not on file  . Attends Religious Services: Not on file  . Active Member of Clubs or Organizations: Not on file  . Attends Banker Meetings: Not on file  . Marital Status: Not on file  Intimate Partner Violence:   . Fear of Current or Ex-Partner: Not on file  . Emotionally Abused: Not on file  . Physically Abused: Not on file  . Sexually Abused: Not on file    ROS Review of Systems  Constitutional: Negative for chills, diaphoresis, fatigue, fever and unexpected weight change.  HENT: Negative.   Eyes: Negative for photophobia and visual disturbance.  Respiratory: Negative.   Cardiovascular: Negative.   Gastrointestinal: Negative.   Endocrine: Negative for polyphagia and polyuria.  Genitourinary: Negative for difficulty urinating, hematuria and urgency.  Musculoskeletal: Negative for gait problem and joint swelling.  Skin: Negative for pallor and rash.  Allergic/Immunologic: Negative for immunocompromised state.  Neurological: Negative for light-headedness and headaches.  Hematological: Does not bruise/bleed easily.  Psychiatric/Behavioral: Negative.     Objective:   Today's Vitals: BP 140/78   Pulse 88   Temp 98.8 F (37.1 C) (Tympanic)   Ht 6\' 1"  (1.854 m)   Wt 289 lb 6.4 oz (131.3 kg)   SpO2 99%   BMI 38.18 kg/m   Physical Exam Vitals and nursing note reviewed.  Constitutional:      General: He is not in acute distress.    Appearance: Normal appearance. He is obese. He is not ill-appearing, toxic-appearing or diaphoretic.  HENT:     Head: Normocephalic and atraumatic.     Right Ear: Tympanic membrane, ear canal and external ear normal. There is no impacted cerumen.     Left Ear: Tympanic membrane, ear canal and external ear normal. There  is no impacted cerumen.     Nose: No congestion or rhinorrhea.     Mouth/Throat:     Mouth: Mucous membranes are moist.     Pharynx: Oropharynx is clear. No oropharyngeal exudate or posterior oropharyngeal erythema.  Eyes:     General: No scleral icterus.       Right eye: No discharge.        Left eye: No discharge.     Extraocular Movements: Extraocular movements intact.     Conjunctiva/sclera: Conjunctivae normal.     Pupils: Pupils are equal, round,  and reactive to light.  Cardiovascular:     Rate and Rhythm: Normal rate and regular rhythm.  Pulmonary:     Effort: Pulmonary effort is normal.     Breath sounds: Normal breath sounds.  Musculoskeletal:     Cervical back: Normal range of motion and neck supple. No rigidity or tenderness.  Lymphadenopathy:     Cervical: No cervical adenopathy.  Skin:    General: Skin is warm and dry.  Neurological:     Mental Status: He is alert and oriented to person, place, and time.  Psychiatric:        Mood and Affect: Mood normal.        Behavior: Behavior normal.     Assessment & Plan:   Problem List Items Addressed This Visit      Cardiovascular and Mediastinum   Essential hypertension - Primary (Chronic)   Coronary artery disease involving native heart without angina pectoris     Digestive   Gastroesophageal reflux disease     Endocrine   Type 2 diabetes mellitus with hyperglycemia, with long-term current use of insulin (HCC)   Relevant Orders   Ambulatory referral to Endocrinology   Ambulatory referral to Podiatry     Other   Morbid obesity (HCC)   History of gout      Outpatient Encounter Medications as of 11/28/2019  Medication Sig  . acetaminophen (TYLENOL) 325 MG tablet Take 2 tablets (650 mg total) by mouth every 4 (four) hours as needed for headache or mild pain.  Marland Kitchen allopurinol (ZYLOPRIM) 100 MG tablet Take 100 mg by mouth daily.  Marland Kitchen aspirin 81 MG chewable tablet Chew 1 tablet (81 mg total) by mouth daily.  Marland Kitchen  dexlansoprazole (DEXILANT) 60 MG capsule Take 60 mg by mouth daily.  . Dulaglutide (TRULICITY) 0.75 MG/0.5ML SOPN Inject into the skin.  Marland Kitchen insulin detemir (LEVEMIR) 100 UNIT/ML injection Inject 0.4 mLs (40 Units total) into the skin 2 (two) times daily. (Patient taking differently: Inject 50 Units into the skin 2 (two) times daily. )  . nebivolol (BYSTOLIC) 5 MG tablet Take 5 mg by mouth daily.  . nitroGLYCERIN (NITROSTAT) 0.4 MG SL tablet Place 1 tablet (0.4 mg total) under the tongue every 5 (five) minutes x 3 doses as needed for chest pain.  . Omega-3 Fatty Acids (FISH OIL) 1000 MG CAPS Take 1,000 mg by mouth 2 (two) times a day.  . rosuvastatin (CRESTOR) 40 MG tablet Take 40 mg by mouth at bedtime.  Marland Kitchen amLODipine (NORVASC) 5 MG tablet Take 1 tablet (5 mg total) by mouth daily. (Patient not taking: Reported on 11/28/2019)  . fluticasone (FLONASE) 50 MCG/ACT nasal spray Place into the nose.   No facility-administered encounter medications on file as of 11/28/2019.    Follow-up: Return in about 6 months (around 05/27/2020), or return fasting for 49 year old physical ..  Patient declines flu vaccine because he had developed a transient fever with his last vaccination.  Recommended that he have the flu vaccine in the code with vaccine with his current medical history.  He was given information on preventing disease through vaccination. Mliss Sax, MD

## 2019-12-09 ENCOUNTER — Other Ambulatory Visit: Payer: Self-pay | Admitting: Family Medicine

## 2019-12-09 NOTE — Telephone Encounter (Signed)
Patient calling and requesting RX  90 day for dexilent 60 mg and Crestor 40 mg sent to pharmacy.  Please call patient at [phone].

## 2019-12-12 NOTE — Telephone Encounter (Signed)
Patient calling to find out the status of message sent on 12/09/19. Please call patient about the status. Patient state that he needs his medication.

## 2019-12-14 NOTE — Telephone Encounter (Signed)
Dr. Doreene Burke refill request for Dexilant 60mg  and Crestor 40mg  pending for your approval last OV with you 11/28/19. Okay to refill?

## 2019-12-15 MED ORDER — DEXILANT 60 MG PO CPDR: 60.0000 mg | DELAYED_RELEASE_CAPSULE | Freq: Every day | ORAL | 2 refills | Status: DC

## 2019-12-15 MED ORDER — ROSUVASTATIN CALCIUM 40 MG PO TABS: 40.0000 mg | ORAL_TABLET | Freq: Every day | ORAL | 2 refills | Status: DC

## 2019-12-26 ENCOUNTER — Other Ambulatory Visit: Payer: Self-pay

## 2019-12-26 ENCOUNTER — Emergency Department (HOSPITAL_COMMUNITY)
Admission: EM | Admit: 2019-12-26 | Discharge: 2019-12-26 | Disposition: A | Discharge status: Home / Self Care | Payer: BC Managed Care – PPO | Attending: Emergency Medicine | Admitting: Emergency Medicine

## 2019-12-26 ENCOUNTER — Encounter (HOSPITAL_COMMUNITY): Payer: Self-pay | Admitting: Emergency Medicine

## 2019-12-26 ENCOUNTER — Emergency Department (HOSPITAL_COMMUNITY): Payer: BC Managed Care – PPO

## 2019-12-26 DIAGNOSIS — R0789 Other chest pain: Secondary | ICD-10-CM | POA: Insufficient documentation

## 2019-12-26 DIAGNOSIS — E119 Type 2 diabetes mellitus without complications: Secondary | ICD-10-CM | POA: Insufficient documentation

## 2019-12-26 DIAGNOSIS — I2510 Atherosclerotic heart disease of native coronary artery without angina pectoris: Secondary | ICD-10-CM | POA: Insufficient documentation

## 2019-12-26 DIAGNOSIS — Z87891 Personal history of nicotine dependence: Secondary | ICD-10-CM | POA: Diagnosis not present

## 2019-12-26 DIAGNOSIS — I252 Old myocardial infarction: Secondary | ICD-10-CM | POA: Insufficient documentation

## 2019-12-26 DIAGNOSIS — R0602 Shortness of breath: Secondary | ICD-10-CM | POA: Insufficient documentation

## 2019-12-26 DIAGNOSIS — I10 Essential (primary) hypertension: Secondary | ICD-10-CM | POA: Insufficient documentation

## 2019-12-26 LAB — COMPREHENSIVE METABOLIC PANEL
ALT: 24 U/L (ref 0–44)
AST: 21 U/L (ref 15–41)
Albumin: 3.6 g/dL (ref 3.5–5.0)
Alkaline Phosphatase: 83 U/L (ref 38–126)
Anion gap: 13 (ref 5–15)
BUN: 10 mg/dL (ref 6–20)
CO2: 26 mmol/L (ref 22–32)
Calcium: 9.9 mg/dL (ref 8.9–10.3)
Chloride: 97 mmol/L — ABNORMAL LOW (ref 98–111)
Creatinine, Ser: 0.89 mg/dL (ref 0.61–1.24)
GFR calc Af Amer: >60 mL/min (ref >60)
GFR calc non Af Amer: >60 mL/min (ref >60)
Glucose, Bld: 336 mg/dL — ABNORMAL HIGH (ref 70–99)
Potassium: 4.3 mmol/L (ref 3.5–5.1)
Sodium: 136 mmol/L (ref 135–145)
Total Bilirubin: 0.8 mg/dL (ref 0.3–1.2)
Total Protein: 7.1 g/dL (ref 6.5–8.1)

## 2019-12-26 LAB — TROPONIN I (HIGH SENSITIVITY)
Troponin I (High Sensitivity): 4 ng/L (ref <18)
Troponin I (High Sensitivity): 3 ng/L (ref <18)

## 2019-12-26 LAB — CBC
HCT: 46.5 % (ref 39.0–52.0)
Hemoglobin: 14.8 g/dL (ref 13.0–17.0)
MCH: 26.4 pg (ref 26.0–34.0)
MCHC: 31.8 g/dL (ref 30.0–36.0)
MCV: 83 fL (ref 80.0–100.0)
Platelets: 298 10*3/uL (ref 150–400)
RBC: 5.6 MIL/uL (ref 4.22–5.81)
RDW: 13.5 % (ref 11.5–15.5)
WBC: 6.9 10*3/uL (ref 4.0–10.5)
nRBC: 0 % (ref 0.0–0.2)

## 2019-12-26 LAB — LIPASE, BLOOD: Lipase: 34 U/L (ref 11–51)

## 2019-12-26 NOTE — ED Provider Notes (Signed)
[facility] EMERGENCY DEPARTMENT Provider Note   CSN: [id number] Arrival date & time: 12/26/19  0930     History Chief Complaint  Patient presents with  . Chest Pain    Aaron Franco is a 49 y.o. male.  49 yo male with PMH of MI s/p stent placement in 2014, hypertension, HLD, T2DM who is presenting for 3d history of intermittent chest pain and SOB. Pain is primarily located on the left with radiation to his left intrascapular region. He notes that he has had intermittent similar pains since his MI in 2014 however over the past week, the frequency and length of time to resolution has increased. Not pleurtic in nature. He does endorse some relation to exertion however it does not happen every time and does not really resolve with rest. No recent fever, chills or cough. No recent heavy lifting. Denies orthopnea. No recent long periods of immobilization or history of blood clots. He has tried TUMs and PPI at home which did not resolve the sx.      HPI: A 49 year old patient with a history of treated diabetes, hypertension, hypercholesterolemia and obesity presents for evaluation of chest pain. Initial onset of pain was approximately 1-3 hours ago. The patient's chest pain is not worse with exertion. The patient's chest pain is middle- or left-sided, is not well-localized, is not described as heaviness/pressure/tightness, is not sharp and does not radiate to the arms/jaw/neck. The patient does not complain of nausea and denies diaphoresis. The patient has no history of stroke, has no history of peripheral artery disease, has not smoked in the past 90 days and has no relevant family history of coronary artery disease (first degree relative at less than age 82).  Past Medical History:  Diagnosis Date  . CAD (coronary artery disease)    a. 05/2013 NSTEMI/PCI: mCFX 100%-> 3.0 mm  x 20 mm Promus premier DES, EF 65%;  01/2014 Cath: LM nl, LAD 20-30d, LCX patent distal stent, RCA  30-49m, EF 55-65%.  . Diabetes mellitus   . Essential hypertension   . GERD (gastroesophageal reflux disease)   . Gout   . History of echocardiogram    a. 03/2011 Echo: EF 65%, mild LVH, no rwma, mildly dil LA.  Marland Kitchen Hyperlipidemia   . MI, old   . Obesity     Patient Active Problem List   Diagnosis Date Noted  . History of gout 11/28/2019  . Educated about COVID-19 virus infection 03/29/2019  . Coronary artery disease involving native heart without angina pectoris 03/29/2019  . Chest pain with moderate risk of acute coronary syndrome 09/17/2015  . Type 2 diabetes mellitus with hyperglycemia, with long-term current use of insulin (HCC) 04/23/2015  . Thrush 04/23/2015  . CAD S/P percutaneous coronary angioplasty -  01/24/2014  . Murmur 02/21/2011  . SINUSITIS - ACUTE-NOS 01/09/2011  . EDEMA- LOCALIZED 12/24/2010  . SHOULDER PAIN 10/16/2010  . VITAMIN B12 DEFICIENCY 07/19/2010  . Morbid obesity (HCC) 07/19/2010  . DECREASED LIBIDO 07/19/2010  . STRAIN, CHEST WALL 07/19/2010  . OTHER VITAMIN B12 DEFICIENCY ANEMIA 10/31/2009  . PAIN IN JOINT, HAND 10/16/2009  . TRIGGER FINGER 10/16/2009  . BACK STRAIN, LUMBAR 10/16/2009  . RHINITIS 02/06/2009  . Dyslipidemia 03/01/2007  . GOUT 03/01/2007  . Essential hypertension 03/01/2007  . Gastroesophageal reflux disease 03/01/2007    Past Surgical History:  Procedure Laterality Date  . BICEPS TENDON REPAIR  Nov 13 2010   right  . HAND SURGERY  1990  left  . LEFT HEART CATH N/A 05/12/2013   Procedure: LEFT HEART CATH;  Surgeon: Lesleigh Noe, MD;  Location: [facility] CATH LAB;  Service: Cardiovascular;  Laterality: N/A;  . LEFT HEART CATHETERIZATION WITH CORONARY ANGIOGRAM N/A 01/24/2014   Procedure: LEFT HEART CATHETERIZATION WITH CORONARY ANGIOGRAM;  Surgeon: Peter M Swaziland, MD;  Location: [facility] CATH LAB;  Service: Cardiovascular;  Laterality: N/A;  . PERCUTANEOUS CORONARY STENT INTERVENTION (PCI-S)  05/2013   mCx 3.82mm x 20 mm Promus P DES  .  PERCUTANEOUS CORONARY STENT INTERVENTION (PCI-S)  05/12/2013   Procedure: PERCUTANEOUS CORONARY STENT INTERVENTION (PCI-S);  Surgeon: Lesleigh Noe, MD;  Location: [facility] CATH LAB;  Service: Cardiovascular;;       Family History  Problem Relation Age of Onset  . Heart attack Father        deceased  . Diabetes Father   . Congestive Heart Failure Father   . Stroke Mother   . Pancreatic cancer Maternal Aunt   . Colon cancer Neg Hx     Social History   Tobacco Use  . Smoking status: Former Smoker    Types: Cigarettes    Quit date: 11/10/1998    Years since quitting: 21.1  . Smokeless tobacco: Never Used  . Tobacco comment: pt only smoked 1-2 cigs a day, not everyday on occs x 2 years  Substance Use Topics  . Alcohol use: Yes    Comment: rare  . Drug use: No    Home Medications Prior to Admission medications   Medication Sig Start Date End Date Taking? Authorizing Provider  acetaminophen (TYLENOL) 325 MG tablet Take 2 tablets (650 mg total) by mouth every 4 (four) hours as needed for headache or mild pain. 05/06/17  Yes Kilroy, Luke K, PA-C  allopurinol (ZYLOPRIM) 100 MG tablet Take 100 mg by mouth daily.   Yes [provider]  amLODipine (NORVASC) 10 MG tablet Take 10 mg by mouth daily. 12/04/19  Yes [provider]  aspirin 81 MG chewable tablet Chew 1 tablet (81 mg total) by mouth daily. 05/14/13  Yes Weaver, Scott T, PA-C  benzonatate (TESSALON) 200 MG capsule Take 200 mg by mouth 2 (two) times daily as needed for cough.  05/25/19  Yes [provider]  dexlansoprazole (DEXILANT) 60 MG capsule Take 1 capsule (60 mg total) by mouth daily. 12/15/19  Yes Mliss Sax, MD  FARXIGA 5 MG TABS tablet Take 5 mg by mouth daily. 10/17/19  Yes [provider]  fluticasone (FLONASE) 50 MCG/ACT nasal spray Place 1 spray into both nostrils daily as needed for allergies or rhinitis.  03/15/16 12/26/19 Yes [provider]  Insulin Detemir (LEVEMIR  FLEXTOUCH) 100 UNIT/ML Pen Inject 50 Units into the skin 2 (two) times daily. 11/18/19  Yes [provider]  nebivolol (BYSTOLIC) 5 MG tablet Take 5 mg by mouth daily.   Yes [provider]  nitroGLYCERIN (NITROSTAT) 0.4 MG SL tablet Place 1 tablet (0.4 mg total) under the tongue every 5 (five) minutes x 3 doses as needed for chest pain. 05/06/17  Yes Kilroy, Luke K, PA-C  Omega-3 Fatty Acids (FISH OIL) 1000 MG CAPS Take 1,000 mg by mouth 2 (two) times a day.   Yes [provider]  rosuvastatin (CRESTOR) 40 MG tablet Take 40 mg by mouth daily. 03/29/19  Yes [provider]  insulin detemir (LEVEMIR) 100 UNIT/ML injection Inject 0.4 mLs (40 Units total) into the skin 2 (two) times daily. Patient not  taking: Reported on 12/26/2019 05/06/17   Abelino Derrick, PA-C  rosuvastatin (CRESTOR) 40 MG tablet Take 1 tablet (40 mg total) by mouth at bedtime. Patient not taking: Reported on 12/26/2019 12/15/19   Mliss Sax, MD    Allergies    Influenza vaccines, Fenofibrate, Metformin, and Prednisone  Review of Systems   Review of Systems  Constitutional: Negative for chills and fever.  HENT: Negative.   Respiratory: Positive for shortness of breath. Negative for cough and chest tightness.   Cardiovascular: Positive for chest pain. Negative for palpitations.  Gastrointestinal: Negative.   Genitourinary: Negative.   Musculoskeletal: Negative.   Skin: Negative.   Neurological: Negative.   Psychiatric/Behavioral: Negative.     Physical Exam Updated Vital Signs BP 124/77   Pulse 78   Temp 98.3 F (36.8 C) (Oral)   Resp 16   SpO2 97%   Physical Exam Constitutional:      General: He is not in acute distress.    Appearance: He is not diaphoretic.  Cardiovascular:     Rate and Rhythm: Normal rate and regular rhythm.     Heart sounds: Normal heart sounds.  Pulmonary:     Effort: Pulmonary effort is normal.     Breath sounds: Normal breath sounds.  Chest:       Chest wall: No tenderness.  Abdominal:     General: Bowel sounds are normal.  Musculoskeletal:     Cervical back: Neck supple.     Right lower leg: No edema.     Left lower leg: No edema.     Comments: No tenderness over the left scapular muscles.  Skin:    General: Skin is warm and dry.     Findings: No rash.  Neurological:     General: No focal deficit present.     Mental Status: He is alert.  Psychiatric:        Mood and Affect: Mood normal. Mood is not anxious.     ED Results / Procedures / Treatments   Labs (all labs ordered are listed, but only abnormal results are displayed) Labs Reviewed  COMPREHENSIVE METABOLIC PANEL - Abnormal; Notable for the following components:      Result Value   Chloride 97 (*)    Glucose, Bld 336 (*)    All other components within normal limits  CBC  LIPASE, BLOOD  TROPONIN I (HIGH SENSITIVITY)  TROPONIN I (HIGH SENSITIVITY)    EKG None  Radiology DG Chest Port 1 View  Result Date: 12/26/2019 CLINICAL DATA:  Chest pain 1 week ago. Personal history of coronary stent. EXAM: PORTABLE CHEST 1 VIEW COMPARISON:  Two-view chest x-ray 11/24/2018 FINDINGS: The heart size and mediastinal contours are within normal limits. Both lungs are clear. The visualized skeletal structures are unremarkable. IMPRESSION: No active disease. Electronically Signed   By: Marin Roberts M.D.   On: 12/26/2019 10:17    Procedures Procedures (including critical care time)  Medications Ordered in ED Medications - No data to display  ED Course  I have reviewed the triage vital signs and the nursing notes.  Pertinent labs & imaging results that were available during my care of the patient were reviewed by me and considered in my medical decision making (see chart for details).  Clinical Course as of Dec 25 1350  Mon Dec 26, 2019  1610 Initial assessment Ddx for chest pain includes ACS, dissection, PE, infectious, MSK, GERD, pancreatitis,  cholelithiasis.  EKG not consistent with STEMI however can  not rule out NSTEMI. Significant risk factors including prior MI with stent placement (2014), hypertension, HLD, T2DM. No symptoms to suggest an infectious process.  Pain not pleuritic and no s/s of PE. Well's score 0    [RC]  0959 Some repol abnormalities appreciated. No STE or t-wave inversions.  EKG 12-Lead [RC]  1020 No mediastinal widening or sign of infiltrate.  DG Chest Port 1 View [RC]  1108 First troponin reassuring. HEART score 5. Will repeat troponin and re-evaluate pending those results.  Troponin I (High Sensitivity): 4 [RC]  1242 Delta troponin neg. Spoke with cardiology. Appt arranged on 12/28/19. Will discharge with close follow up. Discussed with patient who is in agreement with the plan.  Troponin I (High Sensitivity): 3 [RC]    Clinical Course User Index [RC] Mitzi Hansen, MD   MDM Rules/Calculators/A&P HEAR Score: 4                    PLEASE SEE ED COURSE FOR MDM.  Final Clinical Impression(s) / ED Diagnoses Final diagnoses:  Atypical chest pain    Rx / DC Orders ED Discharge Orders    None       Mitzi Hansen, MD 12/26/19 1352    Quintella Reichert, MD 12/28/19 [phone]

## 2019-12-26 NOTE — Discharge Instructions (Addendum)
It was a pleasure to meet you today! Based on our tests, your chest pain is less likely related to your heart. We have discussed your case with cardiology and they have arranged an appointment for you on Wednesday at 10:45AM. If your chest pain worsens or other symptoms develop in the mean time, please come back in to get re-evaluated.

## 2019-12-26 NOTE — ED Notes (Signed)
Pt verbalized understanding of discharge instructions. Follow up care reviewed, pt had no further questions at this time. 

## 2019-12-26 NOTE — ED Triage Notes (Addendum)
To ED via [facility] from home, with c/o chest pain started approx 1 week ago, hx of stent in 2014-- the difference today is that pt woke up with pain, pain not caused by any movement or activity.  Pt would take tums or NTG with relief, until today. Became concerned that it started with no activity

## 2019-12-26 NOTE — ED Notes (Signed)
Pt chewed ASA 81mg  x 4 this am, relieved pain

## 2019-12-28 ENCOUNTER — Encounter: Payer: Self-pay | Admitting: Cardiology

## 2019-12-28 ENCOUNTER — Encounter: Payer: Self-pay | Admitting: Physician Assistant

## 2019-12-28 ENCOUNTER — Other Ambulatory Visit: Payer: Self-pay

## 2019-12-28 ENCOUNTER — Ambulatory Visit (INDEPENDENT_AMBULATORY_CARE_PROVIDER_SITE_OTHER): Payer: BC Managed Care – PPO | Admitting: Physician Assistant

## 2019-12-28 VITALS — BP 150/92 | HR 94 | Temp 98.2°F | Ht 72.0 in | Wt 285.0 lb

## 2019-12-28 DIAGNOSIS — E782 Mixed hyperlipidemia: Secondary | ICD-10-CM | POA: Diagnosis not present

## 2019-12-28 DIAGNOSIS — I10 Essential (primary) hypertension: Secondary | ICD-10-CM | POA: Diagnosis not present

## 2019-12-28 DIAGNOSIS — E119 Type 2 diabetes mellitus without complications: Secondary | ICD-10-CM

## 2019-12-28 DIAGNOSIS — Z794 Long term (current) use of insulin: Secondary | ICD-10-CM

## 2019-12-28 DIAGNOSIS — R079 Chest pain, unspecified: Secondary | ICD-10-CM | POA: Diagnosis not present

## 2019-12-28 DIAGNOSIS — I25119 Atherosclerotic heart disease of native coronary artery with unspecified angina pectoris: Secondary | ICD-10-CM

## 2019-12-28 NOTE — Progress Notes (Signed)
Cardiology Office Note:    Date:  12/30/2019   ID:  Aaron Franco, DOB 07-03-1971, MRN [id number]  PCP:  Libby Maw, MD  Cardiologist:  Minus Breeding, MD  Electrophysiologist:  None   Referring MD: Libby Maw,*   Chief Complaint  Patient presents with  . Follow-up    seen for Dr. Percival Spanish.    History of Present Illness:    Aaron Franco is a 49 y.o. male with a hx of CAD, HTN, HLD and DM II. patient had a history of STEMI in 2014 with DES to left circumflex.  Repeat cardiac catheterization in 2015 demonstrated nonobstructive disease.  Echocardiogram obtained on 05/05/2017 showed EF 60 to 65%, grade 1 DD.  Myoview in 2018 was negative.  He was last seen virtually by Dr. Percival Spanish in May 2020 at which time he is off work due to the pandemic.  More recently, patient presented to the ED on 12/26/2019 with chest pain.  High-sensitivity troponin negative x2.  EKG did not show any obvious ST-T wave changes.  Chest x-ray was negative for acute process.  Patient was instructed to follow-up with cardiology service as outpatient.  For the past week, patient has been having a dull pain on the medial side of the left scapula.  He also has some burning sensation in the chest as well.  He does have a history of acid reflux issue however despite taking the acid reflux medication, his symptom has not improved.  Subscapular pain is not worsened with physical activity.  I recommended a 2-day Lexiscan Myoview to further assess his symptoms.  He does work in both as a Visual merchandiser, I recommend to hold off on lifting greater than 20 pounds until stress test can be performed.   Past Medical History:  Diagnosis Date  . CAD (coronary artery disease)    a. 05/2013 NSTEMI/PCI: mCFX 100%-> 3.0 mm  x 20 mm Promus premier DES, EF 65%;  01/2014 Cath: LM nl, LAD 20-30d, LCX patent distal stent, RCA 30-24m, EF 55-65%.  . Diabetes mellitus   . Essential  hypertension   . GERD (gastroesophageal reflux disease)   . Gout   . History of echocardiogram    a. 03/2011 Echo: EF 65%, mild LVH, no rwma, mildly dil LA.  Marland Kitchen Hyperlipidemia   . MI, old   . Obesity     Past Surgical History:  Procedure Laterality Date  . BICEPS TENDON REPAIR  Nov 13 2010   right  . HAND SURGERY  1990   left  . LEFT HEART CATH N/A 05/12/2013   Procedure: LEFT HEART CATH;  Surgeon: Sinclair Grooms, MD;  Location: [facility] CATH LAB;  Service: Cardiovascular;  Laterality: N/A;  . LEFT HEART CATHETERIZATION WITH CORONARY ANGIOGRAM N/A 01/24/2014   Procedure: LEFT HEART CATHETERIZATION WITH CORONARY ANGIOGRAM;  Surgeon: Peter M Martinique, MD;  Location[facility] CATH LAB;  Service: Cardiovascular;  Laterality: N/A;  . PERCUTANEOUS CORONARY STENT INTERVENTION (PCI-S)  05/2013   mCx 3.83mm x 20 mm Promus P DES  . PERCUTANEOUS CORONARY STENT INTERVENTION (PCI-S)  05/12/2013   Procedure: PERCUTANEOUS CORONARY STENT INTERVENTION (PCI-S);  Surgeon: Sinclair Grooms, MD;  Location: [facility] CATH LAB;  Service: Cardiovascular;;    Current Medications: Current Meds  Medication Sig  . acetaminophen (TYLENOL) 325 MG tablet Take 2 tablets (650 mg total) by mouth every 4 (four) hours as needed for headache or mild pain.  Marland Kitchen allopurinol (ZYLOPRIM) 100 MG tablet  Take 100 mg by mouth daily.  Marland Kitchen amLODipine (NORVASC) 10 MG tablet Take 10 mg by mouth daily.  Marland Kitchen aspirin 81 MG chewable tablet Chew 1 tablet (81 mg total) by mouth daily.  . benzonatate (TESSALON) 200 MG capsule Take 200 mg by mouth 2 (two) times daily as needed for cough.   . dexlansoprazole (DEXILANT) 60 MG capsule Take 1 capsule (60 mg total) by mouth daily.  Marland Kitchen FARXIGA 5 MG TABS tablet Take 5 mg by mouth daily.  . Insulin Detemir (LEVEMIR FLEXTOUCH) 100 UNIT/ML Pen Inject 50 Units into the skin 2 (two) times daily.  . insulin detemir (LEVEMIR) 100 UNIT/ML injection Inject 0.4 mLs (40 Units total) into the skin 2 (two) times daily.  . nebivolol  (BYSTOLIC) 5 MG tablet Take 5 mg by mouth daily.  . nitroGLYCERIN (NITROSTAT) 0.4 MG SL tablet Place 1 tablet (0.4 mg total) under the tongue every 5 (five) minutes x 3 doses as needed for chest pain.  . Omega-3 Fatty Acids (FISH OIL) 1000 MG CAPS Take 1,000 mg by mouth 2 (two) times a day.  . rosuvastatin (CRESTOR) 40 MG tablet Take 40 mg by mouth daily.  . rosuvastatin (CRESTOR) 40 MG tablet Take 1 tablet (40 mg total) by mouth at bedtime.     Allergies:   Influenza vaccines, Fenofibrate, Metformin, and Prednisone   Social History   Socioeconomic History  . Marital status: Married    Spouse name: Not on file  . Number of children: 2  . Years of education: Not on file  . Highest education level: Not on file  Occupational History  . Occupation: Insurance underwriter: [employer]  . Occupation: Multimedia programmer: Runner, broadcasting/film/video  Tobacco Use  . Smoking status: Former Smoker    Types: Cigarettes    Quit date: 11/10/1998    Years since quitting: 21.1  . Smokeless tobacco: Never Used  . Tobacco comment: pt only smoked 1-2 cigs a day, not everyday on occs x 2 years  Substance and Sexual Activity  . Alcohol use: Yes    Comment: rare  . Drug use: No  . Sexual activity: Yes  Other Topics Concern  . Not on file  Social History Narrative  . Not on file   Social Determinants of Health   Financial Resource Strain:   . Difficulty of Paying Living Expenses: Not on file  Food Insecurity:   . Worried About Programme researcher, broadcasting/film/video in the Last Year: Not on file  . Ran Out of Food in the Last Year: Not on file  Transportation Needs:   . Lack of Transportation (Medical): Not on file  . Lack of Transportation (Non-Medical): Not on file  Physical Activity:   . Days of Exercise per Week: Not on file  . Minutes of Exercise per Session: Not on file  Stress:   . Feeling of Stress : Not on file  Social Connections:   . Frequency of Communication with Friends and Family: Not on  file  . Frequency of Social Gatherings with Friends and Family: Not on file  . Attends Religious Services: Not on file  . Active Member of Clubs or Organizations: Not on file  . Attends Banker Meetings: Not on file  . Marital Status: Not on file     Family History: The patient's family history includes Congestive Heart Failure in his father; Diabetes in his father; Heart attack in his father; Pancreatic cancer in  his maternal aunt; Stroke in his mother. There is no history of Colon cancer.  ROS:   Please see the history of present illness.     All other systems reviewed and are negative.  EKGs/Labs/Other Studies Reviewed:    The following studies were reviewed today:  Echo 05/05/2017 LV EF: 60% -  65%   Study Conclusions   - Left ventricle: The cavity size was normal. Wall thickness was  increased in a pattern of moderate LVH. Systolic function was  normal. The estimated ejection fraction was in the range of 60%  to 65%. Wall motion was normal; there were no regional wall  motion abnormalities. Doppler parameters are consistent with  abnormal left ventricular relaxation (grade 1 diastolic  dysfunction). The E/e&' ratio is between 8-15, suggesting  indeterminate LV filling pressure.  - Left atrium: The atrium was normal in size.   Impressions:   - Compared to a prior study in 2012, there are a few changes. There  is now moderate LVH, grade 1 DD and indeterminate LV filling  pressure. The LVEF is stable at 60-65%.   EKG:  EKG is not ordered today.  EKG obtained on 12/26/2019 has been reviewed, sinus rhythm without significant ST-T wave changes.  Recent Labs: 12/26/2019: ALT 24; BUN 10; Creatinine, Ser 0.89; Hemoglobin 14.8; Platelets 298; Potassium 4.3; Sodium 136  Recent Lipid Panel    Component Value Date/Time   CHOL 195 06/08/2018 1213   TRIG 647 (HH) 06/08/2018 1213   HDL 25 (L) 06/08/2018 1213   CHOLHDL 7.8 (H) 06/08/2018 1213    CHOLHDL NOT REPORTED DUE TO HIGH TRIGLYCERIDES 05/05/2017 0008   VLDL UNABLE TO CALCULATE IF TRIGLYCERIDE OVER 400 mg/dL 37/08/6268 4854   LDLCALC Comment 06/08/2018 1213   LDLDIRECT 99.7 07/19/2010 1017    Physical Exam:    VS:  BP (!) 150/92   Pulse 94   Temp 98.2 F (36.8 C)   Ht 6' (1.829 m)   Wt 285 lb (129.3 kg)   SpO2 97%   BMI 38.65 kg/m     Wt Readings from Last 3 Encounters:  12/28/19 285 lb (129.3 kg)  11/28/19 289 lb 6.4 oz (131.3 kg)  03/29/19 270 lb (122.5 kg)     GEN:  Well nourished, well developed in no acute distress HEENT: Normal NECK: No JVD; No carotid bruits LYMPHATICS: No lymphadenopathy CARDIAC: RRR, no murmurs, rubs, gallops RESPIRATORY:  Clear to auscultation without rales, wheezing or rhonchi  ABDOMEN: Soft, non-tender, non-distended MUSCULOSKELETAL:  No edema; No deformity  SKIN: Warm and dry NEUROLOGIC:  Alert and oriented x 3 PSYCHIATRIC:  Normal affect   ASSESSMENT:    1. Chest pain of uncertain etiology   2. Coronary artery disease involving native coronary artery of native heart with angina pectoris (HCC)   3. Essential hypertension   4. Mixed hyperlipidemia   5. Controlled type 2 diabetes mellitus without complication, with long-term current use of insulin (HCC)    PLAN:    In order of problems listed above:  1. Chest pain: Recently seen in the ED.  He described the discomfort as a dull ache on the medial side of the left scapula, there was also some burning sensation in the chest as well.  This is very similar to the previous angina symptoms however does not seem to worsen with physical exertion.  High-sensitivity troponin negative and EKG shows no ischemic changes.  I recommended 2-day Lexiscan Myoview  2. CAD: Continue aspirin and Crestor  3.  Hypertension: Continue on current therapy  4. Hyperlipidemia: On Crestor 40 mg daily  5. DM2: On insulin managed by primary care provider   Medication Adjustments/Labs and Tests  Ordered: Current medicines are reviewed at length with the patient today.  Concerns regarding medicines are outlined above.  Orders Placed This Encounter  Procedures  . MYOCARDIAL PERFUSION IMAGING   No orders of the defined types were placed in this encounter.   Patient Instructions  Medication Instructions:  Your physician recommends that you continue on your current medications as directed. Please refer to the Current Medication list given to you today.  *If you need a refill on your cardiac medications before your next appointment, please call your pharmacy*  Lab Work: NONE ordered at this time of appointment   If you have labs (blood work) drawn today and your tests are completely normal, you will receive your results only by: Marland Kitchen MyChart Message (if you have MyChart) OR . A paper copy in the mail If you have any lab test that is abnormal or we need to change your treatment, we will call you to review the results.  Testing/Procedures: Your physician has requested that you have a lexiscan myoview. For further information please visit [email or web address] Please follow instruction sheet, as given.    PLEASE SCHEDULE AS SOON AS POSSIBLE OR WITHIN 1 WEEK  Follow-Up: At [facility], you and your health needs are our priority.  As part of our continuing mission to provide you with exceptional heart care, we have created designated Provider Care Teams.  These Care Teams include your primary Cardiologist (physician) and Advanced Practice Providers (APPs -  Physician Assistants and Nurse Practitioners) who all work together to provide you with the care you need, when you need it.  Your next appointment:   4 month(s)  The format for your next appointment:   In Person  Provider:   Rollene Rotunda, MD  Other Instructions      Signed, Azalee Course, PA  12/30/2019 10:32 AM    [facility]

## 2019-12-28 NOTE — Progress Notes (Signed)
   Primary Cardiologist: Rollene Rotunda, MD  Mr. Pattrick Bady ([birth year]) was seen in the cardiology office today on 12/28/2019. We recommend hold off on lifting >20 pounds until additional work-up can be completed within the next week. If absolutely need to lift >20 lbs during work, I would recommend hold off on working until additional testing can be completed.   Thank you  Azalee Course, [place] 12/28/2019, 12:06 PM

## 2019-12-28 NOTE — Patient Instructions (Signed)
Medication Instructions:  Your physician recommends that you continue on your current medications as directed. Please refer to the Current Medication list given to you today.  *If you need a refill on your cardiac medications before your next appointment, please call your pharmacy*  Lab Work: NONE ordered at this time of appointment   If you have labs (blood work) drawn today and your tests are completely normal, you will receive your results only by: Marland Kitchen MyChart Message (if you have MyChart) OR . A paper copy in the mail If you have any lab test that is abnormal or we need to change your treatment, we will call you to review the results.  Testing/Procedures: Your physician has requested that you have a lexiscan myoview. For further information please visit [email or web address] Please follow instruction sheet, as given.    PLEASE SCHEDULE AS SOON AS POSSIBLE OR WITHIN 1 WEEK  Follow-Up: At [facility], you and your health needs are our priority.  As part of our continuing mission to provide you with exceptional heart care, we have created designated Provider Care Teams.  These Care Teams include your primary Cardiologist (physician) and Advanced Practice Providers (APPs -  Physician Assistants and Nurse Practitioners) who all work together to provide you with the care you need, when you need it.  Your next appointment:   4 month(s)  The format for your next appointment:   In Person  Provider:   Rollene Rotunda, MD  Other Instructions

## 2019-12-30 ENCOUNTER — Encounter: Payer: Self-pay | Admitting: Physician Assistant

## 2020-01-02 ENCOUNTER — Ambulatory Visit: Payer: BC Managed Care – PPO | Admitting: Adult Health

## 2020-01-03 ENCOUNTER — Telehealth (HOSPITAL_COMMUNITY): Payer: Self-pay

## 2020-01-03 NOTE — Telephone Encounter (Signed)
Encounter complete. 

## 2020-01-04 ENCOUNTER — Other Ambulatory Visit: Payer: Self-pay

## 2020-01-04 ENCOUNTER — Ambulatory Visit (HOSPITAL_COMMUNITY)
Admission: RE | Admit: 2020-01-04 | Discharge: 2020-01-04 | Disposition: A | Discharge status: Home / Self Care | Payer: BC Managed Care – PPO | Source: Ambulatory Visit | Attending: Cardiovascular Disease | Admitting: Cardiovascular Disease

## 2020-01-04 DIAGNOSIS — R079 Chest pain, unspecified: Secondary | ICD-10-CM

## 2020-01-04 MED ORDER — REGADENOSON 0.4 MG/5ML IV SOLN
0.4000 mg | Freq: Once | INTRAVENOUS | Status: AC
  Administered 2020-01-04: 0.4 mg via INTRAVENOUS

## 2020-01-04 MED ORDER — TECHNETIUM TC 99M TETROFOSMIN IV KIT
30.8000 | PACK | Freq: Once | INTRAVENOUS | Status: AC | PRN
  Administered 2020-01-04: 30.8 via INTRAVENOUS
  Filled 2020-01-04: qty 31

## 2020-01-05 ENCOUNTER — Ambulatory Visit (HOSPITAL_COMMUNITY)
Admission: RE | Admit: 2020-01-05 | Discharge: 2020-01-05 | Disposition: A | Discharge status: Home / Self Care | Payer: BC Managed Care – PPO | Source: Ambulatory Visit | Attending: Cardiology | Admitting: Cardiology

## 2020-01-05 LAB — MYOCARDIAL PERFUSION IMAGING
LV dias vol: 122 mL (ref 62–150)
LV sys vol: 49 mL
Peak HR: 113 {beats}/min
Rest HR: 86 {beats}/min
SDS: 2
SRS: 1
SSS: 3
TID: 1.02

## 2020-01-05 MED ORDER — TECHNETIUM TC 99M TETROFOSMIN IV KIT
28.4000 | PACK | Freq: Once | INTRAVENOUS | Status: AC | PRN
  Administered 2020-01-05: 28.4 via INTRAVENOUS

## 2020-01-06 NOTE — Progress Notes (Signed)
Low risk stress test, normal pumping function of heart, no sign of significant reversible blockage.

## 2020-02-07 ENCOUNTER — Ambulatory Visit: Payer: BC Managed Care – PPO | Admitting: Podiatry

## 2020-02-08 ENCOUNTER — Telehealth: Payer: Self-pay | Admitting: Family Medicine

## 2020-02-08 DIAGNOSIS — I2510 Atherosclerotic heart disease of native coronary artery without angina pectoris: Secondary | ICD-10-CM

## 2020-02-08 DIAGNOSIS — Z9861 Coronary angioplasty status: Secondary | ICD-10-CM

## 2020-02-08 NOTE — Telephone Encounter (Signed)
Patient is calling and requesting a refill for dexilant and bystolic sent to [facility] on [address]. CB is [phone]

## 2020-02-09 ENCOUNTER — Telehealth: Payer: Self-pay | Admitting: Family Medicine

## 2020-02-09 MED ORDER — NEBIVOLOL HCL 5 MG PO TABS: 5.0000 mg | ORAL_TABLET | Freq: Every day | ORAL | 1 refills | Status: DC

## 2020-02-09 NOTE — Telephone Encounter (Signed)
PA for Dexilant 60 mg cap started  [name] (Key: [id number])  PA for this med approved--pharmacy notify  BIN  [id number] PCN  ADV GRP RX [id number] ID  [id number]

## 2020-02-09 NOTE — Telephone Encounter (Signed)
Dr. Doreene Burke please advise, ok to send in Bystolic 5 mg once daily? You never send in this rx before--send to [facility] in [place] if possible.    FYI--Dexilant needs PA prior to refill--working on it.

## 2020-02-09 NOTE — Telephone Encounter (Signed)
Pt is aware.  

## 2020-02-09 NOTE — Telephone Encounter (Signed)
Done

## 2020-02-13 NOTE — Progress Notes (Deleted)
Name: Aaron Franco  MRN/ DOB: [id number], Apr 29, 1971   Age/ Sex: 49 y.o., male    PCP: Mliss Sax, MD   Reason for Endocrinology Evaluation: Type 2 Diabetes Mellitus     Date of Initial Endocrinology Visit: 02/13/2020     PATIENT IDENTIFIER: Aaron Franco is a 49 y.o. male with a past medical history of T2DM, Gout , CAD , HTN and Dyslipidemia. The patient presented for initial endocrinology clinic visit on 02/13/2020 for consultative assistance with his diabetes management.    HPI: Aaron Franco was    Diagnosed with DM *** Prior Medications tried/Intolerance: *** Currently checking blood sugars *** x / day,  before breakfast and ***.  Hypoglycemia episodes : ***               Symptoms: ***                 Frequency: ***/  Hemoglobin A1c has ranged from 7.2% in 2012, peaking at 13.1% in 2016. Patient required assistance for hypoglycemia:  Patient has required hospitalization within the last 1 year from hyper or hypoglycemia:   In terms of diet, the patient ***   HOME DIABETES REGIMEN: Levemir  Farxiga 5 mg daily   Statin: yes ACE-I/ARB: {YES/NO:17245} Prior Diabetic Education: {Yes/No:11203}   METER DOWNLOAD SUMMARY: Date range evaluated: *** Fingerstick Blood Glucose Tests = *** Average Number Tests/Day = *** Overall Mean FS Glucose = *** Standard Deviation = ***  BG Ranges: Low = *** High = ***   Hypoglycemic Events/30 Days: BG < 50 = *** Episodes of symptomatic severe hypoglycemia = ***   DIABETIC COMPLICATIONS: Microvascular complications:   ***  Denies: CKD  Last eye exam: Completed   Macrovascular complications:   CAD (S/P PCI)  Denies: PVD, CVA   PAST HISTORY: Past Medical History:  Past Medical History:  Diagnosis Date  . CAD (coronary artery disease)    a. 05/2013 NSTEMI/PCI: mCFX 100%-> 3.0 mm  x 20 mm Promus premier DES, EF 65%;  01/2014 Cath: LM nl, LAD 20-30d, LCX patent distal stent, RCA 30-81m, EF 55-65%.  .  Diabetes mellitus   . Essential hypertension   . GERD (gastroesophageal reflux disease)   . Gout   . History of echocardiogram    a. 03/2011 Echo: EF 65%, mild LVH, no rwma, mildly dil LA.  Marland Kitchen Hyperlipidemia   . MI, old   . Obesity     Past Surgical History:  Past Surgical History:  Procedure Laterality Date  . BICEPS TENDON REPAIR  Nov 13 2010   right  . HAND SURGERY  1990   left  . LEFT HEART CATH N/A 05/12/2013   Procedure: LEFT HEART CATH;  Surgeon: Lesleigh Noe, MD;  Location: [facility] CATH LAB;  Service: Cardiovascular;  Laterality: N/A;  . LEFT HEART CATHETERIZATION WITH CORONARY ANGIOGRAM N/A 01/24/2014   Procedure: LEFT HEART CATHETERIZATION WITH CORONARY ANGIOGRAM;  Surgeon: Peter M Swaz[place], MD;  Location: [facility] CATH LAB;  Service: Cardiovascular;  Laterality: N/A;  . PERCUTANEOUS CORONARY STENT INTERVENTION (PCI-S)  05/2013   mCx 3.12mm x 20 mm Promus P DES  . PERCUTANEOUS CORONARY STENT INTERVENTION (PCI-S)  05/12/2013   Procedure: PERCUTANEOUS CORONARY STENT INTERVENTION (PCI-S);  Surgeon: Lesleigh Noe, MD;  Location: [facility] CATH LAB;  Service: Cardiovascular;;      Social History:  reports that he quit smoking about 21 years ago. His smoking use included cigarettes. He has never used smokeless tobacco. He reports  current alcohol use. He reports that he does not use drugs. Family History:  Family History  Problem Relation Age of Onset  . Heart attack Father        deceased  . Diabetes Father   . Congestive Heart Failure Father   . Stroke Mother   . Pancreatic cancer Maternal Aunt   . Colon cancer Neg Hx       HOME MEDICATIONS: Allergies as of 02/14/2020      Reactions   Influenza Vaccines Anaphylaxis   Reaction June 2017   Fenofibrate Other (See Comments)   Hot flashes   Metformin Other (See Comments)   Chest discomfort   Prednisone Other (See Comments)   Mood swings      Medication List       Accurate as of February 13, 2020  3:08 PM. If you have any questions,  ask your nurse or doctor.        acetaminophen 325 MG tablet Commonly known as: TYLENOL Take 2 tablets (650 mg total) by mouth every 4 (four) hours as needed for headache or mild pain.   allopurinol 100 MG tablet Commonly known as: ZYLOPRIM Take 100 mg by mouth daily.   amLODipine 10 MG tablet Commonly known as: NORVASC Take 10 mg by mouth daily.   aspirin 81 MG chewable tablet Chew 1 tablet (81 mg total) by mouth daily.   benzonatate 200 MG capsule Commonly known as: TESSALON Take 200 mg by mouth 2 (two) times daily as needed for cough.   Dexilant 60 MG capsule Generic drug: dexlansoprazole Take 1 capsule (60 mg total) by mouth daily.   Farxiga 5 MG Tabs tablet Generic drug: dapagliflozin propanediol Take 5 mg by mouth daily.   Fish Oil 1000 MG Caps Take 1,000 mg by mouth 2 (two) times a day.   fluticasone 50 MCG/ACT nasal spray Commonly known as: FLONASE Place 1 spray into both nostrils daily as needed for allergies or rhinitis.   insulin detemir 100 UNIT/ML injection Commonly known as: Levemir Inject 0.4 mLs (40 Units total) into the skin 2 (two) times daily.   Levemir FlexTouch 100 UNIT/ML FlexPen Generic drug: insulin detemir Inject 50 Units into the skin 2 (two) times daily.   nebivolol 5 MG tablet Commonly known as: BYSTOLIC Take 1 tablet (5 mg total) by mouth daily.   nitroGLYCERIN 0.4 MG SL tablet Commonly known as: NITROSTAT Place 1 tablet (0.4 mg total) under the tongue every 5 (five) minutes x 3 doses as needed for chest pain.   rosuvastatin 40 MG tablet Commonly known as: CRESTOR Take 40 mg by mouth daily.   rosuvastatin 40 MG tablet Commonly known as: CRESTOR Take 1 tablet (40 mg total) by mouth at bedtime.        ALLERGIES: Allergies  Allergen Reactions  . Influenza Vaccines Anaphylaxis    Reaction June 2017  . Fenofibrate Other (See Comments)    Hot flashes  . Metformin Other (See Comments)    Chest discomfort  . Prednisone  Other (See Comments)    Mood swings     REVIEW OF SYSTEMS: A comprehensive ROS was conducted with the patient and is negative except as per HPI and below:  ROS    OBJECTIVE:   VITAL SIGNS: There were no vitals taken for this visit.   PHYSICAL EXAM:  General: Pt appears well and is in NAD  Hydration: Well-hydrated with moist mucous membranes and good skin turgor  HEENT: Head: Unremarkable with good dentition. Oropharynx  clear without exudate.  Eyes: External eye exam normal without stare, lid lag or exophthalmos.  EOM intact.  PERRL.  Neck: General: Supple without adenopathy or carotid bruits. Thyroid: Thyroid size normal.  No goiter or nodules appreciated. No thyroid bruit.  Lungs: Clear with good BS bilat with no rales, rhonchi, or wheezes  Heart: RRR with normal S1 and S2 and no gallops; no murmurs; no rub  Abdomen: Normoactive bowel sounds, soft, nontender, without masses or organomegaly palpable  Extremities:  Lower extremities - No pretibial edema. No lesions.  Skin: Normal texture and temperature to palpation. No rash noted. No Acanthosis nigricans/skin tags. No lipohypertrophy.  Neuro: MS is good with appropriate affect, pt is alert and Ox3    DM foot exam:    DATA REVIEWED:  Lab Results  Component Value Date   HGBA1C 11.8 (H) 09/18/2015   HGBA1C 13.1 (H) 04/23/2015   HGBA1C 9.9 (H) 01/24/2014   Lab Results  Component Value Date   MICROALBUR 2.13 (H) 11/23/2009   LDLCALC Comment 06/08/2018   CREATININE 0.89 12/26/2019   No results found for: [facility]  Lab Results  Component Value Date   CHOL 195 06/08/2018   HDL 25 (L) 06/08/2018   LDLCALC Comment 06/08/2018   LDLDIRECT 99.7 07/19/2010   TRIG 647 (HH) 06/08/2018   CHOLHDL 7.8 (H) 06/08/2018        ASSESSMENT / PLAN / RECOMMENDATIONS:   1) Type 2 Diabetes Mellitus, ***controlled, With*** complications - Most recent A1c of *** %. Goal A1c < 7.0 %.     Plan: GENERAL:  ***  MEDICATIONS:  ***  EDUCATION / INSTRUCTIONS:  BG monitoring instructions: Patient is instructed to check his blood sugars *** times a day, ***.  Call [facility] if: BG persistently < 70 or > 300. . I reviewed the Rule of 15 for the treatment of hypoglycemia in detail with the patient. Literature supplied.   2) Diabetic complications:   Eye: Does *** have known diabetic retinopathy.   Neuro/ Feet: Does *** have known diabetic peripheral neuropathy.  Renal: Patient does *** have known baseline CKD. He is *** on an ACEI/ARB at present.Check urine albumin/creatinine ratio yearly starting at time of diagnosis. If albuminuria is positive, treatment is geared toward better glucose, blood pressure control and use of ACE inhibitors or ARBs. Monitor electrolytes and creatinine once to twice yearly.   3) Lipids: Patient is on rosuvastatin 40 mg daily.    4) Hypertension: ***  at goal of < 140/90 mmHg.       Signed electronically by: Lyndle Herrlich, MD  [facility]  [facility] [address] [place], [place] [address] Phone: [phone] FAX: [phone]   CC: Mliss Sax, MD [address] [place] [address] Phone: [phone]  Fax: [phone]    Return to Endocrinology clinic as below: Future Appointments  Date Time Provider Department Center  02/14/2020  8:50 AM Diahann Guajardo, Konrad Dolores, MD [facility] None  02/17/2020  4:00 PM Mliss Sax, MD [facility]  04/23/2020  9:00 AM Rollene Rotunda, MD [facility]

## 2020-02-14 ENCOUNTER — Ambulatory Visit: Payer: BC Managed Care – PPO | Admitting: Internal Medicine

## 2020-02-16 ENCOUNTER — Telehealth: Payer: Self-pay | Admitting: Family Medicine

## 2020-02-16 NOTE — Telephone Encounter (Signed)
Pt call request our help getting reimbursement from [employer] from money that he paid for Dexilant 60 mg. Pt is hoping for can back date it for PA.   Advised the pt and his spouse that I am not aware how to back date PA or did something like this before, request the pt to get more information of how to do this from [employer]---pt is aware--he will get back with [place].

## 2020-02-17 ENCOUNTER — Ambulatory Visit: Payer: BC Managed Care – PPO | Admitting: Family Medicine

## 2020-02-22 ENCOUNTER — Other Ambulatory Visit: Payer: Self-pay | Admitting: Family Medicine

## 2020-02-27 ENCOUNTER — Telehealth: Payer: Self-pay | Admitting: Family Medicine

## 2020-02-27 ENCOUNTER — Ambulatory Visit: Payer: BC Managed Care – PPO | Admitting: Nurse Practitioner

## 2020-02-27 NOTE — Telephone Encounter (Signed)
Patient is calling and requesting a TOC from Dr. Doreene Burke to [place]. Pls advise. CB is [phone]

## 2020-02-27 NOTE — Telephone Encounter (Signed)
Due to high volume of transfer from previous providers who left practice and new patients, I will not be able to accept any other transfers. Thank you

## 2020-02-27 NOTE — Telephone Encounter (Signed)
Has appointment with me

## 2020-03-08 ENCOUNTER — Ambulatory Visit: Payer: BC Managed Care – PPO | Admitting: Family Medicine

## 2020-03-12 ENCOUNTER — Other Ambulatory Visit: Payer: Self-pay

## 2020-03-13 ENCOUNTER — Ambulatory Visit (INDEPENDENT_AMBULATORY_CARE_PROVIDER_SITE_OTHER): Payer: BC Managed Care – PPO | Admitting: Family Medicine

## 2020-03-13 ENCOUNTER — Encounter: Payer: Self-pay | Admitting: Family Medicine

## 2020-03-13 VITALS — BP 124/70 | HR 85 | Temp 96.9°F | Ht 72.0 in | Wt 277.0 lb

## 2020-03-13 DIAGNOSIS — E1140 Type 2 diabetes mellitus with diabetic neuropathy, unspecified: Secondary | ICD-10-CM | POA: Insufficient documentation

## 2020-03-13 DIAGNOSIS — N5201 Erectile dysfunction due to arterial insufficiency: Secondary | ICD-10-CM | POA: Diagnosis not present

## 2020-03-13 DIAGNOSIS — E1165 Type 2 diabetes mellitus with hyperglycemia: Secondary | ICD-10-CM | POA: Diagnosis not present

## 2020-03-13 DIAGNOSIS — M109 Gout, unspecified: Secondary | ICD-10-CM | POA: Diagnosis not present

## 2020-03-13 DIAGNOSIS — Z794 Long term (current) use of insulin: Secondary | ICD-10-CM | POA: Diagnosis not present

## 2020-03-13 DIAGNOSIS — Z8739 Personal history of other diseases of the musculoskeletal system and connective tissue: Secondary | ICD-10-CM

## 2020-03-13 DIAGNOSIS — I10 Essential (primary) hypertension: Secondary | ICD-10-CM

## 2020-03-13 DIAGNOSIS — E782 Mixed hyperlipidemia: Secondary | ICD-10-CM

## 2020-03-13 DIAGNOSIS — I2510 Atherosclerotic heart disease of native coronary artery without angina pectoris: Secondary | ICD-10-CM

## 2020-03-13 DIAGNOSIS — G629 Polyneuropathy, unspecified: Secondary | ICD-10-CM | POA: Diagnosis not present

## 2020-03-13 MED ORDER — GABAPENTIN 300 MG PO CAPS: ORAL_CAPSULE | ORAL | 3 refills | Status: DC

## 2020-03-13 NOTE — Progress Notes (Addendum)
Established Patient Office Visit  Subjective:  Patient ID: Aaron Franco, male    DOB: November 01, 1971  Age: 49 y.o. MRN: [id number]  CC:  Chief Complaint  Patient presents with  . Pain    c/o lower back pains that come and go leg pain x 2 months very achy when patient relaxing     HPI Aaron Franco presents for follow-up on his diabetes, hypertension, coronary artery disease, elevated cholesterol, gout.  He was seen back in January.  Asked for referrals to endocrinology and podiatry.  He tells me that he has follow-up with endocrinology on the 24th of this month.  He is working 2 jobs and it is been hard for him to make appointments.  He has been able to lose some weight and says that his blood sugars and blood pressure have improved.  Tells me that his stress test in February went well.  For the last few months he has developed paresthesias in both of his legs.  The right seems to be affected more so than the left.  Paresthesias can be burning and stinging and/or numb.  He denies changes in his bowel or bladder function or incontinence.  Denies saddle paresthesias.  Intermittent lower back pain that is nonradiating.  History of erectile dysfunction.  He had developed chest pressure when he took Viagra.  He has not required nitroglycerin in a few years.  Past Medical History:  Diagnosis Date  . CAD (coronary artery disease)    a. 05/2013 NSTEMI/PCI: mCFX 100%-> 3.0 mm  x 20 mm Promus premier DES, EF 65%;  01/2014 Cath: LM nl, LAD 20-30d, LCX patent distal stent, RCA 30-67m, EF 55-65%.  . Diabetes mellitus   . Essential hypertension   . GERD (gastroesophageal reflux disease)   . Gout   . History of echocardiogram    a. 03/2011 Echo: EF 65%, mild LVH, no rwma, mildly dil LA.  Marland Kitchen Hyperlipidemia   . MI, old   . Obesity     Past Surgical History:  Procedure Laterality Date  . BICEPS TENDON REPAIR  Nov 13 2010   right  . HAND SURGERY  1990   left  . LEFT HEART CATH N/A 05/12/2013   Procedure: LEFT HEART CATH;  Surgeon: Lesleigh Noe, MD;  Location: [facility] CATH LAB;  Service: Cardiovascular;  Laterality: N/A;  . LEFT HEART CATHETERIZATION WITH CORONARY ANGIOGRAM N/A 01/24/2014   Procedure: LEFT HEART CATHETERIZATION WITH CORONARY ANGIOGRAM;  Surgeon: Peter M Swaziland, MD;  Location: [facility] CATH LAB;  Service: Cardiovascular;  Laterality: N/A;  . PERCUTANEOUS CORONARY STENT INTERVENTION (PCI-S)  05/2013   mCx 3.27mm x 20 mm Promus P DES  . PERCUTANEOUS CORONARY STENT INTERVENTION (PCI-S)  05/12/2013   Procedure: PERCUTANEOUS CORONARY STENT INTERVENTION (PCI-S);  Surgeon: Lesleigh Noe, MD;  Location: [facility] CATH LAB;  Service: Cardiovascular;;    Family History  Problem Relation Age of Onset  . Heart attack Father        deceased  . Diabetes Father   . Congestive Heart Failure Father   . Stroke Mother   . Pancreatic cancer Maternal Aunt   . Colon cancer Neg Hx     Social History   Socioeconomic History  . Marital status: Married    Spouse name: Not on file  . Number of children: 2  . Years of education: Not on file  . Highest education level: Not on file  Occupational History  . Occupation: floor maintenance  Employer: [employer]  . Occupation: Multimedia programmercook    [employer]er: [employer]  Tobacco Use  . Smoking status: Former Smoker    Types: Cigarettes    Quit date: 11/10/1998    Years since quitting: 21.3  . Smokeless tobacco: Never Used  . Tobacco comment: pt only smoked 1-2 cigs a day, not everyday on occs x 2 years  Substance and Sexual Activity  . Alcohol use: Yes    Comment: rare  . Drug use: No  . Sexual activity: Yes  Other Topics Concern  . Not on file  Social History Narrative  . Not on file   Social Determinants of Health   Financial Resource Strain:   . Difficulty of Paying Living Expenses:   Food Insecurity:   . Worried About Programme researcher, broadcasting/film/videounning Out of Food in the Last Year:   . Baristaan Out of Food in the Last Year:   Transportation Needs:   . Sales promotion account executiveLack  of Transportation (Medical):   Marland Kitchen. Lack of Transportation (Non-Medical):   Physical Activity:   . Days of Exercise per Week:   . Minutes of Exercise per Session:   Stress:   . Feeling of Stress :   Social Connections:   . Frequency of Communication with Friends and Family:   . Frequency of Social Gatherings with Friends and Family:   . Attends Religious Services:   . Active Member of Clubs or Organizations:   . Attends BankerClub or Organization Meetings:   Marland Kitchen. Marital Status:   Intimate Partner Violence:   . Fear of Current or Ex-Partner:   . Emotionally Abused:   Marland Kitchen. Physically Abused:   . Sexually Abused:     Outpatient Medications Prior to Visit  Medication Sig Dispense Refill  . acetaminophen (TYLENOL) 325 MG tablet Take 2 tablets (650 mg total) by mouth every 4 (four) hours as needed for headache or mild pain.    Marland Kitchen. allopurinol (ZYLOPRIM) 100 MG tablet TAKE 1 TABLET(100 MG) BY MOUTH DAILY 90 tablet 0  . amLODipine (NORVASC) 10 MG tablet Take 10 mg by mouth daily.    Marland Kitchen. aspirin 81 MG chewable tablet Chew 1 tablet (81 mg total) by mouth daily.    . benzonatate (TESSALON) 200 MG capsule Take 200 mg by mouth 2 (two) times daily as needed for cough.     . dexlansoprazole (DEXILANT) 60 MG capsule Take 1 capsule (60 mg total) by mouth daily. 90 capsule 2  . FARXIGA 5 MG TABS tablet Take 5 mg by mouth daily.    . Insulin Detemir (LEVEMIR FLEXTOUCH) 100 UNIT/ML Pen Inject 50 Units into the skin 2 (two) times daily.    . insulin detemir (LEVEMIR) 100 UNIT/ML injection Inject 0.4 mLs (40 Units total) into the skin 2 (two) times daily.    . nebivolol (BYSTOLIC) 5 MG tablet Take 1 tablet (5 mg total) by mouth daily. 90 tablet 1  . nitroGLYCERIN (NITROSTAT) 0.4 MG SL tablet Place 1 tablet (0.4 mg total) under the tongue every 5 (five) minutes x 3 doses as needed for chest pain. 25 tablet 2  . Omega-3 Fatty Acids (FISH OIL) 1000 MG CAPS Take 1,000 mg by mouth 2 (two) times a day.    . rosuvastatin  (CRESTOR) 40 MG tablet Take 1 tablet (40 mg total) by mouth at bedtime. 90 tablet 2  . fluticasone (FLONASE) 50 MCG/ACT nasal spray Place 1 spray into both nostrils daily as needed for allergies or rhinitis.     . rosuvastatin (CRESTOR)  40 MG tablet Take 40 mg by mouth daily.     No facility-administered medications prior to visit.    Allergies  Allergen Reactions  . Influenza Vaccines Anaphylaxis    Reaction June 2017  . Fenofibrate Other (See Comments)    Hot flashes  . Metformin Other (See Comments)    Chest discomfort  . Prednisone Other (See Comments)    Mood swings    ROS Review of Systems  Constitutional: Negative.   HENT: Negative.   Eyes: Negative for photophobia and visual disturbance.  Respiratory: Negative.   Cardiovascular: Negative.   Gastrointestinal: Negative.   Endocrine: Negative for polyphagia and polyuria.  Genitourinary: Positive for urgency. Negative for difficulty urinating and frequency.  Musculoskeletal: Positive for back pain. Negative for gait problem, joint swelling and myalgias.  Allergic/Immunologic: Negative for immunocompromised state.  Neurological: Positive for numbness. Negative for weakness.  Hematological: Does not bruise/bleed easily.  Psychiatric/Behavioral: Negative.       Objective:    Physical Exam  Constitutional: He appears well-developed and well-nourished. No distress.  HENT:  Head: Normocephalic and atraumatic.  Right Ear: External ear normal.  Left Ear: External ear normal.  Eyes: Conjunctivae are normal. Right eye exhibits no discharge. Left eye exhibits no discharge. No scleral icterus.  Neck: No JVD present. No tracheal deviation present.  Cardiovascular: Normal rate, regular rhythm and normal heart sounds.  Pulses:      Dorsalis pedis pulses are 2+ on the right side and 2+ on the left side.       Posterior tibial pulses are 1+ on the right side and 1+ on the left side.  Pulmonary/Chest: Effort normal and breath  sounds normal. No stridor.  Musculoskeletal:     Lumbar back: No tenderness or bony tenderness. Normal range of motion.  Neurological: He has normal strength.  Negative dural tension signs.   Skin: He is not diaphoretic.    BP 124/70   Pulse 85   Temp (!) 96.9 F (36.1 C) (Tympanic)   Ht 6' (1.829 m)   Wt 277 lb (125.6 kg)   SpO2 98%   BMI 37.57 kg/m  Wt Readings from Last 3 Encounters:  03/13/20 277 lb (125.6 kg)  01/04/20 285 lb (129.3 kg)  12/28/19 285 lb (129.3 kg)     Health Maintenance Due  Topic Date Due  . PNEUMOCOCCAL POLYSACCHARIDE VACCINE AGE 64-64 HIGH RISK  Never done  . OPHTHALMOLOGY EXAM  Never done  . COVID-19 Vaccine (1) Never done  . TETANUS/TDAP  09/26/2018    There are no preventive care reminders to display for this patient.  Lab Results  Component Value Date   TSH 1.16 03/13/2020   Lab Results  Component Value Date   WBC 7.9 03/13/2020   HGB 14.0 03/13/2020   HCT 41.8 03/13/2020   MCV 79.7 03/13/2020   PLT 296.0 03/13/2020   Lab Results  Component Value Date   NA 136 03/13/2020   K 4.1 03/13/2020   CO2 28 03/13/2020   GLUCOSE 199 (H) 03/13/2020   BUN 14 03/13/2020   CREATININE 0.76 03/13/2020   BILITOT 0.5 03/13/2020   ALKPHOS 80 03/13/2020   AST 20 03/13/2020   ALT 20 03/13/2020   PROT 7.2 03/13/2020   ALBUMIN 4.2 03/13/2020   CALCIUM 9.9 03/13/2020   ANIONGAP 13 12/26/2019   GFR 131.52 03/13/2020   Lab Results  Component Value Date   CHOL 245 (H) 03/13/2020   Lab Results  Component Value Date  HDL 34.80 (L) 03/13/2020   Lab Results  Component Value Date   LDLCALC Comment 06/08/2018   Lab Results  Component Value Date   TRIG (H) 03/13/2020    448.0 Triglyceride is over 400; calculations on Lipids are invalid.   Lab Results  Component Value Date   CHOLHDL 7 03/13/2020   Lab Results  Component Value Date   HGBA1C 12.4 (H) 03/13/2020      Assessment & Plan:   Problem List Items Addressed This Visit       Cardiovascular and Mediastinum   Essential hypertension - Primary (Chronic)   Relevant Orders   CBC (Completed)   Comprehensive metabolic panel (Completed)   Urinalysis, Routine w reflex microscopic (Completed)   Microalbumin / creatinine urine ratio (Completed)   Coronary artery disease involving native heart without angina pectoris   Relevant Orders   Comprehensive metabolic panel (Completed)   LDL cholesterol, direct (Completed)   Lipid panel (Completed)   Urinalysis, Routine w reflex microscopic (Completed)   Microalbumin / creatinine urine ratio (Completed)   Erectile dysfunction due to arterial insufficiency   Relevant Orders   LDL cholesterol, direct (Completed)   PSA (Completed)   Lipid panel (Completed)   Ambulatory referral to Urology   Urinalysis, Routine w reflex microscopic (Completed)   Microalbumin / creatinine urine ratio (Completed)     Endocrine   Type 2 diabetes mellitus with hyperglycemia, with long-term current use of insulin (HCC)   Relevant Orders   Comprehensive metabolic panel (Completed)   Hemoglobin A1c (Completed)   Lipid panel (Completed)   Ambulatory referral to Ophthalmology   Urinalysis, Routine w reflex microscopic (Completed)   Microalbumin / creatinine urine ratio (Completed)     Nervous and Auditory   Neuropathy   Relevant Medications   gabapentin (NEURONTIN) 300 MG capsule   vitamin B-12 (CYANOCOBALAMIN) 500 MCG tablet   Other Relevant Orders   Vitamin B12 (Completed)   LDL cholesterol, direct (Completed)   TSH (Completed)   Urinalysis, Routine w reflex microscopic (Completed)   Microalbumin / creatinine urine ratio (Completed)     Other   GOUT   Relevant Orders   Uric acid (Completed)   Urinalysis, Routine w reflex microscopic (Completed)   Microalbumin / creatinine urine ratio (Completed)   History of gout   Relevant Orders   Comprehensive metabolic panel (Completed)   Urinalysis, Routine w reflex microscopic (Completed)    Microalbumin / creatinine urine ratio (Completed)      Meds ordered this encounter  Medications  . gabapentin (NEURONTIN) 300 MG capsule    Sig: Take one at night for one week and then increase to one twice daily.    Dispense:  60 capsule    Refill:  3  . vitamin B-12 (CYANOCOBALAMIN) 500 MCG tablet    Sig: Take 1 tablet (500 mcg total) by mouth daily.    Dispense:  90 tablet    Refill:  1    Follow-up: Return in about 3 months (around 06/13/2020).   Congratulated him on his weight loss and encouraged him to continue.  With a normal back exam we discussed that his neuropathy is likely related to his diabetes.  Checking B12 and TSH levels today.  He had said that ibuprofen and Tylenol with codeine do not help.  He requests referral to urology for follow-up of his ED.  3 hours fasting for today's blood work. Mliss Sax, MD   5/13 addendum: Awilda Metro were elevated. Patient was non fasting. Have  asked to return fasting for repeat lipid profile.

## 2020-03-14 LAB — LIPID PANEL
Cholesterol: 245 mg/dL — ABNORMAL HIGH (ref 0–200)
HDL: 34.8 mg/dL — ABNORMAL LOW (ref >39.00)
Total CHOL/HDL Ratio: 7
Triglycerides: 448 mg/dL — ABNORMAL HIGH (ref 0.0–149.0)

## 2020-03-14 LAB — COMPREHENSIVE METABOLIC PANEL
ALT: 20 U/L (ref 0–53)
AST: 20 U/L (ref 0–37)
Albumin: 4.2 g/dL (ref 3.5–5.2)
Alkaline Phosphatase: 80 U/L (ref 39–117)
BUN: 14 mg/dL (ref 6–23)
CO2: 28 mEq/L (ref 19–32)
Calcium: 9.9 mg/dL (ref 8.4–10.5)
Chloride: 101 mEq/L (ref 96–112)
Creatinine, Ser: 0.76 mg/dL (ref 0.40–1.50)
GFR: 131.52 mL/min (ref >60.00)
Glucose, Bld: 199 mg/dL — ABNORMAL HIGH (ref 70–99)
Potassium: 4.1 mEq/L (ref 3.5–5.1)
Sodium: 136 mEq/L (ref 135–145)
Total Bilirubin: 0.5 mg/dL (ref 0.2–1.2)
Total Protein: 7.2 g/dL (ref 6.0–8.3)

## 2020-03-14 LAB — LDL CHOLESTEROL, DIRECT: Direct LDL: 110 mg/dL

## 2020-03-14 LAB — URIC ACID: Uric Acid, Serum: 6.9 mg/dL (ref 4.0–7.8)

## 2020-03-14 LAB — HEMOGLOBIN A1C: Hgb A1c MFr Bld: 12.4 % — ABNORMAL HIGH (ref 4.6–6.5)

## 2020-03-14 LAB — CBC
HCT: 41.8 % (ref 39.0–52.0)
Hemoglobin: 14 g/dL (ref 13.0–17.0)
MCHC: 33.5 g/dL (ref 30.0–36.0)
MCV: 79.7 fl (ref 78.0–100.0)
Platelets: 296 10*3/uL (ref 150.0–400.0)
RBC: 5.24 Mil/uL (ref 4.22–5.81)
RDW: 14.3 % (ref 11.5–15.5)
WBC: 7.9 10*3/uL (ref 4.0–10.5)

## 2020-03-14 LAB — PSA: PSA: 0.39 ng/mL (ref 0.10–4.00)

## 2020-03-14 LAB — TSH: TSH: 1.16 u[IU]/mL (ref 0.35–4.50)

## 2020-03-14 LAB — VITAMIN B12: Vitamin B-12: 306 pg/mL (ref 211–911)

## 2020-03-15 LAB — URINALYSIS, ROUTINE W REFLEX MICROSCOPIC
Bilirubin Urine: NEGATIVE
Hgb urine dipstick: NEGATIVE
Ketones, ur: NEGATIVE
Leukocytes,Ua: NEGATIVE
Nitrite: NEGATIVE
RBC / HPF: NONE SEEN (ref 0–?)
Specific Gravity, Urine: 1.015 (ref 1.000–1.030)
Total Protein, Urine: 30 — AB
Urine Glucose: >=1000 — AB
Urobilinogen, UA: 0.2 (ref 0.0–1.0)
WBC, UA: NONE SEEN (ref 0–?)
pH: 6 (ref 5.0–8.0)

## 2020-03-15 LAB — MICROALBUMIN / CREATININE URINE RATIO
Creatinine,U: 103.9 mg/dL
Microalb Creat Ratio: 19.3 mg/g (ref 0.0–30.0)
Microalb, Ur: 20 mg/dL — ABNORMAL HIGH (ref 0.0–1.9)

## 2020-03-15 NOTE — Addendum Note (Signed)
Addended by: Dietrich Ke M on: 03/15/2020 07:59 AM   Modules accepted: Orders  

## 2020-03-15 NOTE — Addendum Note (Signed)
Addended by: Varney Biles on: 03/15/2020 07:59 AM   Modules accepted: Orders

## 2020-03-20 ENCOUNTER — Other Ambulatory Visit: Payer: Self-pay

## 2020-03-20 MED ORDER — CYANOCOBALAMIN 500 MCG PO TABS: 500.0000 ug | ORAL_TABLET | Freq: Every day | ORAL | 1 refills | Status: DC

## 2020-03-20 NOTE — Addendum Note (Signed)
Addended by: Andrez Grime on: 03/20/2020 09:17 AM   Modules accepted: Orders

## 2020-03-22 ENCOUNTER — Encounter: Payer: Self-pay | Admitting: Internal Medicine

## 2020-03-22 ENCOUNTER — Ambulatory Visit (INDEPENDENT_AMBULATORY_CARE_PROVIDER_SITE_OTHER): Payer: BC Managed Care – PPO | Admitting: Internal Medicine

## 2020-03-22 ENCOUNTER — Other Ambulatory Visit: Payer: Self-pay

## 2020-03-22 VITALS — BP 158/84 | HR 88 | Ht 72.0 in | Wt 285.8 lb

## 2020-03-22 DIAGNOSIS — Z794 Long term (current) use of insulin: Secondary | ICD-10-CM

## 2020-03-22 DIAGNOSIS — E1165 Type 2 diabetes mellitus with hyperglycemia: Secondary | ICD-10-CM | POA: Diagnosis not present

## 2020-03-22 DIAGNOSIS — E782 Mixed hyperlipidemia: Secondary | ICD-10-CM | POA: Diagnosis not present

## 2020-03-22 LAB — GLUCOSE, POCT (MANUAL RESULT ENTRY): POC Glucose: 310 mg/dl — AB (ref 70–99)

## 2020-03-22 MED ORDER — TRESIBA FLEXTOUCH 100 UNIT/ML ~~LOC~~ SOPN: 80.0000 [IU] | PEN_INJECTOR | Freq: Every day | SUBCUTANEOUS | 3 refills | Status: DC

## 2020-03-22 MED ORDER — FARXIGA 10 MG PO TABS: 10.0000 mg | ORAL_TABLET | Freq: Every day | ORAL | 1 refills | Status: DC

## 2020-03-22 MED ORDER — FARXIGA 10 MG PO TABS: 10.0000 mg | ORAL_TABLET | Freq: Every day | ORAL | 6 refills | Status: DC

## 2020-03-22 NOTE — Addendum Note (Signed)
Addended by: Andrez Grime on: 03/22/2020 09:34 AM   Modules accepted: Orders

## 2020-03-22 NOTE — Patient Instructions (Addendum)
-   Increase Farxiga to 10 mg daily with Breakfast  - Stop Levemir  - Start Tresiba 80 units daily    - Contact us should your sugars go below 70 mg/dL       Choose healthy, lower carb lower calorie snacks: toss salad,vegetables, cottage cheese, peanut butter, low fat cheese / string cheese, lower sodium deli meat, tuna salad or chicken salad     HOW TO TREAT LOW BLOOD SUGARS (Blood sugar LESS THAN 70 MG/DL)  Please follow the RULE OF 15 for the treatment of hypoglycemia treatment (when your (blood sugars are less than 70 mg/dL)    STEP 1: Take 15 grams of carbohydrates when your blood sugar is low, which includes:   3-4 GLUCOSE TABS  OR  3-4 OZ OF JUICE OR REGULAR SODA OR  ONE TUBE OF GLUCOSE GEL     STEP 2: RECHECK blood sugar in 15 MINUTES STEP 3: If your blood sugar is still low at the 15 minute recheck --> then, go back to STEP 1 and treat AGAIN with another 15 grams of carbohydrates.

## 2020-03-22 NOTE — Progress Notes (Signed)
Name: Aaron Franco  MRN/ DOB: [id number], 28-Dec-1970   Age/ Sex: 49 y.o., male    PCP: Libby Maw, MD   Reason for Endocrinology Evaluation: Type 2 Diabetes Mellitus     Date of Initial Endocrinology Visit: 03/22/2020     PATIENT IDENTIFIER: Aaron Franco is a 49 y.o. male with a past medical history of HTN, CAD ( S/P PCI) and T2DM . The patient presented for initial endocrinology clinic visit on 03/22/2020 for consultative assistance with his diabetes management.    HPI: Aaron Franco was    Diagnosed with DM 08/2009 Prior Medications tried/Intolerance: Metformin- chest discomfort. Glimepiride, Januvia , Trulicity - unable to tolerate higher doses - nausea, blurry vision  Currently checking blood sugars 2 x / day Hypoglycemia episodes : no           Hemoglobin A1c has ranged from 6.6% in 2011, peaking at 13.1% in 2016. Patient required assistance for hypoglycemia: no  Patient has required hospitalization within the last 1 year from hyper or hypoglycemia: no   In terms of diet, the patient eats 3 meals, snacks 1- 2 x a day, drinks sugar- sweetened beverages    HOME DIABETES REGIMEN: Farxiga 5 mg daily  Levemir 50 units BID    Statin: yes ACE-I/ARB: No Prior Diabetic Education:Yes    METER DOWNLOAD SUMMARY: Did not bring    DIABETIC COMPLICATIONS: Microvascular complications:   Neuropathy  Denies: CKD, retinopathy  Last eye exam: Completed 2020  Macrovascular complications:   CAD (S/P PCI)  Denies: PVD, CVA   PAST HISTORY: Past Medical History:  Past Medical History:  Diagnosis Date  . CAD (coronary artery disease)    a. 05/2013 NSTEMI/PCI: mCFX 100%-> 3.0 mm  x 20 mm Promus premier DES, EF 65%;  01/2014 Cath: LM nl, LAD 20-30d, LCX patent distal stent, RCA 30-48m, EF 55-65%.  . Diabetes mellitus   . Essential hypertension   . GERD (gastroesophageal reflux disease)   . Gout   . History of echocardiogram    a. 03/2011 Echo: EF 65%,  mild LVH, no rwma, mildly dil LA.  Marland Kitchen Hyperlipidemia   . MI, old   . Obesity    Past Surgical History:  Past Surgical History:  Procedure Laterality Date  . BICEPS TENDON REPAIR  Nov 13 2010   right  . HAND SURGERY  1990   left  . LEFT HEART CATH N/A 05/12/2013   Procedure: LEFT HEART CATH;  Surgeon: Sinclair Grooms, MD;  Location: [facility] CATH LAB;  Service: Cardiovascular;  Laterality: N/A;  . LEFT HEART CATHETERIZATION WITH CORONARY ANGIOGRAM N/A 01/24/2014   Procedure: LEFT HEART CATHETERIZATION WITH CORONARY ANGIOGRAM;  Surgeon: Peter M Martinique, MD;  Location: [facility] CATH LAB;  Service: Cardiovascular;  Laterality: N/A;  . PERCUTANEOUS CORONARY STENT INTERVENTION (PCI-S)  05/2013   mCx 3.19mm x 20 mm Promus P DES  . PERCUTANEOUS CORONARY STENT INTERVENTION (PCI-S)  05/12/2013   Procedure: PERCUTANEOUS CORONARY STENT INTERVENTION (PCI-S);  Surgeon: Sinclair Grooms, MD;  Location: [facility] CATH LAB;  Service: Cardiovascular;;      Social History:  reports that he quit smoking about 21 years ago. His smoking use included cigarettes. He has never used smokeless tobacco. He reports current alcohol use. He reports that he does not use drugs. Family History:  Family History  Problem Relation Age of Onset  . Heart attack Father        deceased  . Diabetes Father   .  Congestive Heart Failure Father   . Stroke Mother   . Pancreatic cancer Maternal Aunt   . Colon cancer Neg Hx      HOME MEDICATIONS: Allergies as of 03/22/2020      Reactions   Influenza Vaccines Anaphylaxis   Reaction June 2017   Fenofibrate Other (See Comments)   Hot flashes   Metformin Other (See Comments)   Chest discomfort   Prednisone Other (See Comments)   Mood swings      Medication List       Accurate as of Mar 22, 2020  9:17 AM. If you have any questions, ask your nurse or doctor.        acetaminophen 325 MG tablet Commonly known as: TYLENOL Take 2 tablets (650 mg total) by mouth every 4 (four) hours as needed  for headache or mild pain.   allopurinol 100 MG tablet Commonly known as: ZYLOPRIM TAKE 1 TABLET(100 MG) BY MOUTH DAILY   amLODipine 10 MG tablet Commonly known as: NORVASC Take 10 mg by mouth daily.   aspirin 81 MG chewable tablet Chew 1 tablet (81 mg total) by mouth daily.   benzonatate 200 MG capsule Commonly known as: TESSALON Take 200 mg by mouth 2 (two) times daily as needed for cough.   Dexilant 60 MG capsule Generic drug: dexlansoprazole Take 1 capsule (60 mg total) by mouth daily.   Farxiga 5 MG Tabs tablet Generic drug: dapagliflozin propanediol Take 5 mg by mouth daily.   Fish Oil 1000 MG Caps Take 1,000 mg by mouth 2 (two) times a day.   fluticasone 50 MCG/ACT nasal spray Commonly known as: FLONASE Place 1 spray into both nostrils daily as needed for allergies or rhinitis.   gabapentin 300 MG capsule Commonly known as: NEURONTIN Take one at night for one week and then increase to one twice daily.   insulin detemir 100 UNIT/ML injection Commonly known as: Levemir Inject 0.4 mLs (40 Units total) into the skin 2 (two) times daily.   Levemir FlexTouch 100 UNIT/ML FlexPen Generic drug: insulin detemir Inject 50 Units into the skin 2 (two) times daily.   nebivolol 5 MG tablet Commonly known as: BYSTOLIC Take 1 tablet (5 mg total) by mouth daily.   nitroGLYCERIN 0.4 MG SL tablet Commonly known as: NITROSTAT Place 1 tablet (0.4 mg total) under the tongue every 5 (five) minutes x 3 doses as needed for chest pain.   rosuvastatin 40 MG tablet Commonly known as: CRESTOR Take 40 mg by mouth daily.   rosuvastatin 40 MG tablet Commonly known as: CRESTOR Take 1 tablet (40 mg total) by mouth at bedtime.   UNABLE TO FIND Med Name: livago meter   vitamin B-12 500 MCG tablet Commonly known as: CYANOCOBALAMIN Take 1 tablet (500 mcg total) by mouth daily.        ALLERGIES: Allergies  Allergen Reactions  . Influenza Vaccines Anaphylaxis    Reaction June  2017  . Fenofibrate Other (See Comments)    Hot flashes  . Metformin Other (See Comments)    Chest discomfort  . Prednisone Other (See Comments)    Mood swings     REVIEW OF SYSTEMS: A comprehensive ROS was conducted with the patient and is negative except as per HPI and below:  Review of Systems  Gastrointestinal: Negative for diarrhea and nausea.  Genitourinary: Positive for frequency.  Neurological: Positive for tingling.  Endo/Heme/Allergies: Positive for polydipsia.      OBJECTIVE:   VITAL SIGNS: BP (!) 158/84 (  BP Location: Left Arm, Patient Position: Sitting, Cuff Size: Large)   Pulse 88   Ht 6' (1.829 m)   Wt 285 lb 12.8 oz (129.6 kg)   SpO2 98%   BMI 38.76 kg/m    PHYSICAL EXAM:  General: Pt appears well and is in NAD  HEENT:  Eyes: External eye exam normal without stare, lid lag or exophthalmos.  EOM intact  Neck: General: Supple without adenopathy or carotid bruits. Thyroid: Thyroid size normal.  No goiter or nodules appreciated. No thyroid bruit.  Lungs: Clear with good BS bilat with no rales, rhonchi, or wheezes  Heart: RRR with normal S1 and S2 and no gallops; no murmurs; no rub  Abdomen: Normoactive bowel sounds, soft, nontender, without masses or organomegaly palpable  Extremities:  Lower extremities - No pretibial edema. No lesions.  Skin: Normal texture and temperature to palpation. No rash noted. No Acanthosis nigricans/skin tags. No lipohypertrophy.  Neuro: MS is good with appropriate affect, pt is alert and Ox3       DATA REVIEWED:  Lab Results  Component Value Date   HGBA1C 12.4 (H) 03/13/2020   HGBA1C 11.8 (H) 09/18/2015   HGBA1C 13.1 (H) 04/23/2015   Lab Results  Component Value Date   MICROALBUR 20.0 (H) 03/15/2020   LDLCALC Comment 06/08/2018   CREATININE 0.76 03/13/2020   Lab Results  Component Value Date   MICRALBCREAT 19.3 03/15/2020    Lab Results  Component Value Date   CHOL 245 (H) 03/13/2020   HDL 34.80 (L)  03/13/2020   LDLCALC Comment 06/08/2018   LDLDIRECT 110.0 03/13/2020   TRIG (H) 03/13/2020    448.0 Triglyceride is over 400; calculations on Lipids are invalid.   CHOLHDL 7 03/13/2020        ASSESSMENT / PLAN / RECOMMENDATIONS:   1) Type 2 Diabetes Mellitus, Poorly controlled, With Neuropathic and Macrovascular  complications - Most recent A1c of 12.4 %. Goal A1c < 7.0 %.    Plan: GENERAL: I have discussed with the patient the pathophysiology of diabetes.  We stressed the importance of lifestyle changes including diet and exercise. I explained the complications associated with diabetes including retinopathy, nephropathy, neuropathy as well as increased risk of cardiovascular disease. We went over the benefit seen with glycemic control.    I explained to the patient that diabetic patients are at higher than normal risk for amputations.   I have advised him to avoid sugar- sweetened beverages and to avoid snacks, we discussed low carb options for snacks if necessary .  He is intolerant to Metformin, and higher doses of trulicity   Increase Farxiga as below  Will try switching levemir to [place]   MEDICATIONS: - Increase Farxiga to 10 mg daily with Breakfast  - Stop Levemir  - Start Tresiba 80 units daily   EDUCATION / INSTRUCTIONS:  BG monitoring instructions: Patient is instructed to check his blood sugars 2 times a day.  Call [facility] if: BG persistently < 70  . I reviewed the Rule of 15 for the treatment of hypoglycemia in detail with the patient. Literature supplied.   2) Diabetic complications:   Eye: Does not have known diabetic retinopathy.   Neuro/ Feet: Does  have known diabetic peripheral neuropathy.  Renal: Patient does not have known baseline CKD. He is not on an ACEI/ARB at present.  3) Combined Hyperlipidemia:   - Patient is on statin but LDL is above goal, he admits to non-compliance. Encouraged compliance , discussed cardiovascular  benefits.  - Tg elevated, discussed risk of pancreatitis, emphasized importance of low carb diet      F/u in 3 months     Signed electronically by: Lyndle Herrlich, MD  [facility]  [facility] [address] [place] [place], [place] [address] Phone: [phone] FAX: [phone]   CC: Mliss Sax, MD [address] [place] [address] Phone: [phone]  Fax: [phone]    Return to Endocrinology clinic as below: Future Appointments  Date Time Provider Department Center  04/23/2020  9:00 AM Rollene Rotunda, MD [facility]  06/29/2020 11:00 AM Mliss Sax, MD [id number]

## 2020-04-21 NOTE — Progress Notes (Deleted)
Cardiology Office Note   Date:  04/21/2020   ID:  Aaron Franco, DOB 12-Aug-1971, MRN [id number]  PCP:  Mliss Sax, MD  Cardiologist:   Rollene Rotunda, MD Referring:  ***  No chief complaint on file.     History of Present Illness: Aaron Franco is a 49 y.o. male who presents for follow up of CAD.  He has a history of of STEMI 2014 w/ DES CFX.  Cath demonstrated non obstructive disease in 2015.  He had chest pain in 2018 and a negative perfusion study.   We saw him in Feb after he had been in the ED with chest pain.  He had a low risk perfusion study.  ***    ***aw him he has done well.  He is off work because of COVID 19 and is not as active.  However, he has lost quite a bit of weight through diet.  His sugar is not well controlled but he is going to see an endocrinologist.  His blood pressures been well controlled.  The patient denies any new symptoms such as chest discomfort, neck or arm discomfort. There has been no new shortness of breath, PND or orthopnea. There have been no reported palpitations, presyncope or syncope.     Past Medical History:  Diagnosis Date  . CAD (coronary artery disease)    a. 05/2013 NSTEMI/PCI: mCFX 100%-> 3.0 mm  x 20 mm Promus premier DES, EF 65%;  01/2014 Cath: LM nl, LAD 20-30d, LCX patent distal stent, RCA 30-89m, EF 55-65%.  . Diabetes mellitus   . Essential hypertension   . GERD (gastroesophageal reflux disease)   . Gout   . History of echocardiogram    a. 03/2011 Echo: EF 65%, mild LVH, no rwma, mildly dil LA.  Marland Kitchen Hyperlipidemia   . MI, old   . Obesity     Past Surgical History:  Procedure Laterality Date  . BICEPS TENDON REPAIR  Nov 13 2010   right  . HAND SURGERY  1990   left  . LEFT HEART CATH N/A 05/12/2013   Procedure: LEFT HEART CATH;  Surgeon: Lesleigh Noe, MD;  Location: [facility] CATH LAB;  Service: Cardiovascular;  Laterality: N/A;  . LEFT HEART CATHETERIZATION WITH CORONARY ANGIOGRAM N/A 01/24/2014    Procedure: LEFT HEART CATHETERIZATION WITH CORONARY ANGIOGRAM;  Surgeon: Peter M Swaziland, MD;  Location: [facility] CATH LAB;  Service: Cardiovascular;  Laterality: N/A;  . PERCUTANEOUS CORONARY STENT INTERVENTION (PCI-S)  05/2013   mCx 3.71mm x 20 mm Promus P DES  . PERCUTANEOUS CORONARY STENT INTERVENTION (PCI-S)  05/12/2013   Procedure: PERCUTANEOUS CORONARY STENT INTERVENTION (PCI-S);  Surgeon: Lesleigh Noe, MD;  Location: [facility] CATH LAB;  Service: Cardiovascular;;     Current Outpatient Medications  Medication Sig Dispense Refill  . acetaminophen (TYLENOL) 325 MG tablet Take 2 tablets (650 mg total) by mouth every 4 (four) hours as needed for headache or mild pain.    Marland Kitchen allopurinol (ZYLOPRIM) 100 MG tablet TAKE 1 TABLET(100 MG) BY MOUTH DAILY 90 tablet 0  . amLODipine (NORVASC) 10 MG tablet Take 10 mg by mouth daily.    Marland Kitchen aspirin 81 MG chewable tablet Chew 1 tablet (81 mg total) by mouth daily.    . benzonatate (TESSALON) 200 MG capsule Take 200 mg by mouth 2 (two) times daily as needed for cough.     . dapagliflozin propanediol (FARXIGA) 10 MG TABS tablet Take 10 mg by mouth daily before breakfast.  90 tablet 1  . dexlansoprazole (DEXILANT) 60 MG capsule Take 1 capsule (60 mg total) by mouth daily. 90 capsule 2  . fluticasone (FLONASE) 50 MCG/ACT nasal spray Place 1 spray into both nostrils daily as needed for allergies or rhinitis.     Marland Kitchen gabapentin (NEURONTIN) 300 MG capsule Take one at night for one week and then increase to one twice daily. 60 capsule 3  . insulin degludec (TRESIBA FLEXTOUCH) 100 UNIT/ML FlexTouch Pen Inject 0.8 mLs (80 Units total) into the skin daily. 30 mL 3  . nebivolol (BYSTOLIC) 5 MG tablet Take 1 tablet (5 mg total) by mouth daily. 90 tablet 1  . nitroGLYCERIN (NITROSTAT) 0.4 MG SL tablet Place 1 tablet (0.4 mg total) under the tongue every 5 (five) minutes x 3 doses as needed for chest pain. 25 tablet 2  . Omega-3 Fatty Acids (FISH OIL) 1000 MG CAPS Take 1,000 mg by mouth  2 (two) times a day.    . rosuvastatin (CRESTOR) 40 MG tablet Take 40 mg by mouth daily.    Marland Kitchen UNABLE TO FIND Med Name: livago meter    . vitamin B-12 (CYANOCOBALAMIN) 500 MCG tablet Take 1 tablet (500 mcg total) by mouth daily. 90 tablet 1   No current facility-administered medications for this visit.    Allergies:   Influenza vaccines, Fenofibrate, Metformin, and Prednisone    ROS:  Please see the history of present illness.   Otherwise, review of systems are positive for {NONE DEFAULTED:18576::"none"}.   All other systems are reviewed and negative.    PHYSICAL EXAM: VS:  There were no vitals taken for this visit. , BMI There is no height or weight on file to calculate BMI. GENERAL:  Well appearing NECK:  No jugular venous distention, waveform within normal limits, carotid upstroke brisk and symmetric, no bruits, no thyromegaly LUNGS:  Clear to auscultation bilaterally CHEST:  Unremarkable HEART:  PMI not displaced or sustained,S1 and S2 within normal limits, no S3, no S4, no clicks, no rubs, *** murmurs ABD:  Flat, positive bowel sounds normal in frequency in pitch, no bruits, no rebound, no guarding, no midline pulsatile mass, no hepatomegaly, no splenomegaly EXT:  2 plus pulses throughout, no edema, no cyanosis no clubbing     ***GENERAL:  Well appearing HEENT:  Pupils equal round and reactive, fundi not visualized, oral mucosa unremarkable NECK:  No jugular venous distention, waveform within normal limits, carotid upstroke brisk and symmetric, no bruits, no thyromegaly LYMPHATICS:  No cervical, inguinal adenopathy LUNGS:  Clear to auscultation bilaterally BACK:  No CVA tenderness CHEST:  Unremarkable HEART:  PMI not displaced or sustained,S1 and S2 within normal limits, no S3, no S4, no clicks, no rubs, *** murmurs ABD:  Flat, positive bowel sounds normal in frequency in pitch, no bruits, no rebound, no guarding, no midline pulsatile mass, no hepatomegaly, no  splenomegaly EXT:  2 plus pulses throughout, no edema, no cyanosis no clubbing SKIN:  No rashes no nodules NEURO:  Cranial nerves II through XII grossly intact, motor grossly intact throughout PSYCH:  Cognitively intact, oriented to person place and time    EKG:  EKG {ACTION; IS/IS ZOX:09604540} ordered today. The ekg ordered today demonstrates ***   Recent Labs: 03/13/2020: ALT 20; BUN 14; Creatinine, Ser 0.76; Hemoglobin 14.0; Platelets 296.0; Potassium 4.1; Sodium 136; TSH 1.16    Lipid Panel    Component Value Date/Time   CHOL 245 (H) 03/13/2020 1636   CHOL 195 06/08/2018 1213   TRIG (  H) 03/13/2020 1636    448.0 Triglyceride is over 400; calculations on Lipids are invalid.   HDL 34.80 (L) 03/13/2020 1636   HDL 25 (L) 06/08/2018 1213   CHOLHDL 7 03/13/2020 1636   VLDL UNABLE TO CALCULATE IF TRIGLYCERIDE OVER 400 mg/dL 56/43/3295 1884   LDLCALC Comment 06/08/2018 1213   LDLDIRECT 110.0 03/13/2020 1636      Wt Readings from Last 3 Encounters:  03/22/20 285 lb 12.8 oz (129.6 kg)  03/13/20 277 lb (125.6 kg)  01/04/20 285 lb (129.3 kg)      Other studies Reviewed: Additional studies/ records that were reviewed today include: ***. Review of the above records demonstrates:  Please see elsewhere in the note.  ***   ASSESSMENT AND PLAN:  CAD:   ***  Continue aspirin and Crestor  Hypertension:  *** Continue on current therapy  Hyperlipidemia:  ***  On Crestor 40 mg daily  DM2:  ***  n insulin managed by primary care provider  Current medicines are reviewed at length with the patient today.  The patient {ACTIONS; HAS/DOES NOT HAVE:19233} concerns regarding medicines.  The following changes have been made:  {PLAN; NO CHANGE:13088:s}  Labs/ tests ordered today include: *** No orders of the defined types were placed in this encounter.    Disposition:   FU with ***    Signed, Rollene Rotunda, MD  04/21/2020 1:37 PM    [facility]

## 2020-04-23 ENCOUNTER — Ambulatory Visit: Payer: BC Managed Care – PPO | Admitting: Cardiology

## 2020-05-04 ENCOUNTER — Ambulatory Visit: Payer: BC Managed Care – PPO | Admitting: Cardiology

## 2020-05-10 ENCOUNTER — Telehealth: Payer: BC Managed Care – PPO | Admitting: Family Medicine

## 2020-05-10 NOTE — Progress Notes (Signed)
Virtual Visit via Telephone Note   This visit type was conducted due to national recommendations for restrictions regarding the COVID-19 Pandemic (e.g. social distancing) in an effort to limit this patient's exposure and mitigate transmission in our community.  Due to his co-morbid illnesses, this patient is at least at moderate risk for complications without adequate follow up.  This format is felt to be most appropriate for this patient at this time.  The patient did not have access to video technology/had technical difficulties with video requiring transitioning to audio format only (telephone).  All issues noted in this document were discussed and addressed.  No physical exam could be performed with this format.  Please refer to the patient's chart for his  consent to telehealth for [facility]   The patient was identified using 2 identifiers.  Date:  05/11/2020   ID:  Aaron Franco, DOB [birth year], MRN [id number]  Patient Location: Home Provider Location: Office  PCP:  Mliss Sax, MD  Cardiologist:  Rollene Rotunda, MD  Electrophysiologist:  None   Evaluation Performed:  Follow-Up Visit  Chief Complaint:   Chest pain.    History of Present Illness:    Aaron Franco is a 49 y.o. male  who presents for follow up of CAD.  He has a history of of STEMI 2014 w/ DES CFX.  Cath demonstrated non obstructive disease in 2015.  He had chest pain in 2018 and a negative perfusion study.   We saw him in Feb after he had been in the ED with chest pain.  He had a low risk perfusion study.   He is working two jobs.  The patient denies any new symptoms such as chest discomfort, neck or arm discomfort. There has been no new shortness of breath, PND or orthopnea. There have been no reported palpitations, presyncope or syncope.  He works at [employer].    The patient does not have symptoms concerning for COVID-19 infection (fever, chills, cough, or new shortness of breath).     Past Medical History:  Diagnosis Date  . CAD (coronary artery disease)    a. 05/2013 NSTEMI/PCI: mCFX 100%-> 3.0 mm  x 20 mm Promus premier DES, EF 65%;  01/2014 Cath: LM nl, LAD 20-30d, LCX patent distal stent, RCA 30-67m, EF 55-65%.  . Diabetes mellitus   . Essential hypertension   . GERD (gastroesophageal reflux disease)   . Gout   . History of echocardiogram    a. 03/2011 Echo: EF 65%, mild LVH, no rwma, mildly dil LA.  Marland Kitchen Hyperlipidemia   . MI, old   . Obesity    Past Surgical History:  Procedure Laterality Date  . BICEPS TENDON REPAIR  Nov 13 2010   right  . HAND SURGERY  1990   left  . LEFT HEART CATH N/A 05/12/2013   Procedure: LEFT HEART CATH;  Surgeon: Lesleigh Noe, MD;  Location: [facility] CATH LAB;  Service: Cardiovascular;  Laterality: N/A;  . LEFT HEART CATHETERIZATION WITH CORONARY ANGIOGRAM N/A 01/24/2014   Procedure: LEFT HEART CATHETERIZATION WITH CORONARY ANGIOGRAM;  Surgeon: Peter M Swaziland, MD;  Location: [facility] CATH LAB;  Service: Cardiovascular;  Laterality: N/A;  . PERCUTANEOUS CORONARY STENT INTERVENTION (PCI-S)  05/2013   mCx 3.53mm x 20 mm Promus P DES  . PERCUTANEOUS CORONARY STENT INTERVENTION (PCI-S)  05/12/2013   Procedure: PERCUTANEOUS CORONARY STENT INTERVENTION (PCI-S);  Surgeon: Lesleigh Noe, MD;  Location: [facility] CATH LAB;  Service: Cardiovascular;;  Current Meds  Medication Sig  . acetaminophen (TYLENOL) 325 MG tablet Take 2 tablets (650 mg total) by mouth every 4 (four) hours as needed for headache or mild pain.  Marland Kitchen allopurinol (ZYLOPRIM) 100 MG tablet TAKE 1 TABLET(100 MG) BY MOUTH DAILY  . amLODipine (NORVASC) 10 MG tablet Take 10 mg by mouth daily.  Marland Kitchen aspirin 81 MG chewable tablet Chew 1 tablet (81 mg total) by mouth daily.  . benzonatate (TESSALON) 200 MG capsule Take 200 mg by mouth 2 (two) times daily as needed for cough.   . dapagliflozin propanediol (FARXIGA) 10 MG TABS tablet Take 10 mg by mouth daily before breakfast.  . dexlansoprazole  (DEXILANT) 60 MG capsule Take 1 capsule (60 mg total) by mouth daily.  . fluticasone (FLONASE) 50 MCG/ACT nasal spray Place 1 spray into both nostrils daily as needed for allergies or rhinitis.   Marland Kitchen gabapentin (NEURONTIN) 300 MG capsule Take one at night for one week and then increase to one twice daily.  . insulin degludec (TRESIBA FLEXTOUCH) 100 UNIT/ML FlexTouch Pen Inject 0.8 mLs (80 Units total) into the skin daily.  . nebivolol (BYSTOLIC) 5 MG tablet Take 1 tablet (5 mg total) by mouth daily.  . nitroGLYCERIN (NITROSTAT) 0.4 MG SL tablet Place 1 tablet (0.4 mg total) under the tongue every 5 (five) minutes x 3 doses as needed for chest pain.  . Omega-3 Fatty Acids (FISH OIL) 1000 MG CAPS Take 1,000 mg by mouth 2 (two) times a day.  . rosuvastatin (CRESTOR) 40 MG tablet Take 40 mg by mouth daily.  Marland Kitchen UNABLE TO FIND Med Name: livago meter  . vitamin B-12 (CYANOCOBALAMIN) 500 MCG tablet Take 1 tablet (500 mcg total) by mouth daily.     Allergies:   Influenza vaccines, Fenofibrate, Metformin, and Prednisone   Social History   Tobacco Use  . Smoking status: Former Smoker    Types: Cigarettes    Quit date: 11/10/1998    Years since quitting: 21.5  . Smokeless tobacco: Never Used  . Tobacco comment: pt only smoked 1-2 cigs a day, not everyday on occs x 2 years  Vaping Use  . Vaping Use: Never used  Substance Use Topics  . Alcohol use: Yes    Comment: rare  . Drug use: No     Family Hx: The patient's family history includes Congestive Heart Failure in his father; Diabetes in his father; Heart attack in his father; Pancreatic cancer in his maternal aunt; Stroke in his mother. There is no history of Colon cancer.  ROS:   Please see the history of present illness.     All other systems reviewed and are negative.   Prior CV studies:   The following studies were reviewed today:  NA  Labs/Other Tests and Data Reviewed:    EKG:  No ECG reviewed.  Recent Labs: 03/13/2020: ALT 20;  BUN 14; Creatinine, Ser 0.76; Hemoglobin 14.0; Platelets 296.0; Potassium 4.1; Sodium 136; TSH 1.16   Recent Lipid Panel Lab Results  Component Value Date/Time   CHOL 245 (H) 03/13/2020 04:36 PM   CHOL 195 06/08/2018 12:13 PM   TRIG (H) 03/13/2020 04:36 PM    448.0 Triglyceride is over 400; calculations on Lipids are invalid.   HDL 34.80 (L) 03/13/2020 04:36 PM   HDL 25 (L) 06/08/2018 12:13 PM   CHOLHDL 7 03/13/2020 04:36 PM   LDLCALC Comment 06/08/2018 12:13 PM   LDLDIRECT 110.0 03/13/2020 04:36 PM    Wt Readings from Last 3  Encounters:  05/11/20 280 lb (127 kg)  03/22/20 285 lb 12.8 oz (129.6 kg)  03/13/20 277 lb (125.6 kg)     Objective:    Vital Signs:  BP 128/70   Pulse 60   Ht 6' (1.829 m)   Wt 280 lb (127 kg)   BMI 37.97 kg/m    VITAL SIGNS:  reviewed  ASSESSMENT & PLAN:     CAD:  The patient has no new sypmtoms.  No further cardiovascular testing is indicated.  We will continue with aggressive risk reduction and meds as listed.   Hypertension:   The blood pressure is at target. No change in medications is indicated. We will continue with therapeutic lifestyle changes (TLC). Continue on current therapy   Hyperlipidemia:   LDL was 110 which is not quite at target.  We talked about adding Zetia but he is getting get another lipid profile soon and he has been following his diet better so I think is reasonable to wait for this result but he understands our goal is an LDL less than 70.  On Crestor 40 mg daily  DM2:    A1c was 12.4.  However, he since had his insulin increased and his [place] doubled.  I will defer to his endocrinologist.   Time:   Today, I have spent 16 minutes with the patient with telehealth technology discussing the above problems.     Medication Adjustments/Labs and Tests Ordered: Current medicines are reviewed at length with the patient today.  Concerns regarding medicines are outlined above.   Tests Ordered: No orders of the defined  types were placed in this encounter.   Medication Changes: No orders of the defined types were placed in this encounter.   Follow Up:  In Person in one year.   Signed, Rollene Rotunda, MD  05/11/2020 5:09 PM    [facility]

## 2020-05-11 ENCOUNTER — Encounter: Payer: Self-pay | Admitting: Cardiology

## 2020-05-11 ENCOUNTER — Telehealth (INDEPENDENT_AMBULATORY_CARE_PROVIDER_SITE_OTHER): Payer: BC Managed Care – PPO | Admitting: Cardiology

## 2020-05-11 VITALS — BP 128/70 | HR 60 | Ht 72.0 in | Wt 280.0 lb

## 2020-05-11 DIAGNOSIS — Z794 Long term (current) use of insulin: Secondary | ICD-10-CM

## 2020-05-11 DIAGNOSIS — E785 Hyperlipidemia, unspecified: Secondary | ICD-10-CM

## 2020-05-11 DIAGNOSIS — I10 Essential (primary) hypertension: Secondary | ICD-10-CM

## 2020-05-11 DIAGNOSIS — E119 Type 2 diabetes mellitus without complications: Secondary | ICD-10-CM | POA: Diagnosis not present

## 2020-05-11 DIAGNOSIS — I2510 Atherosclerotic heart disease of native coronary artery without angina pectoris: Secondary | ICD-10-CM

## 2020-05-11 NOTE — Patient Instructions (Signed)
Medication Instructions:  Your physician recommends that you continue on your current medications as directed. Please refer to the Current Medication list given to you today.  *If you need a refill on your cardiac medications before your next appointment, please call your pharmacy*   Lab Work: NONE If you have labs (blood work) drawn today and your tests are completely normal, you will receive your results only by: . MyChart Message (if you have MyChart) OR . A paper copy in the mail If you have any lab test that is abnormal or we need to change your treatment, we will call you to review the results.   Testing/Procedures: NONE   Follow-Up: At [facility], you and your health needs are our priority.  As part of our continuing mission to provide you with exceptional heart care, we have created designated Provider Care Teams.  These Care Teams include your primary Cardiologist (physician) and Advanced Practice Providers (APPs -  Physician Assistants and Nurse Practitioners) who all work together to provide you with the care you need, when you need it.  We recommend signing up for the patient portal called "MyChart".  Sign up information is provided on this After Visit Summary.  MyChart is used to connect with patients for Virtual Visits (Telemedicine).  Patients are able to view lab/test results, encounter notes, upcoming appointments, etc.  Non-urgent messages can be sent to your provider as well.   To learn more about what you can do with MyChart, go to [email or web address].    Your next appointment:   12 month(s)  You will receive a reminder letter in the mail two months in advance. If you don't receive a letter, please call our office to schedule the follow-up appointment.   The format for your next appointment:   In Person  Provider:   You may see [name], MD or one of the following Advanced Practice Providers on your designated Care Team:    [name],  PA-C  [name], DNP, ANP  [name], NP     

## 2020-05-20 ENCOUNTER — Other Ambulatory Visit: Payer: Self-pay | Admitting: Family Medicine

## 2020-05-29 ENCOUNTER — Telehealth: Payer: BC Managed Care – PPO | Admitting: Family Medicine

## 2020-06-29 ENCOUNTER — Encounter: Payer: Self-pay | Admitting: Family Medicine

## 2020-06-29 ENCOUNTER — Telehealth (INDEPENDENT_AMBULATORY_CARE_PROVIDER_SITE_OTHER): Payer: BC Managed Care – PPO | Admitting: Family Medicine

## 2020-06-29 VITALS — BP 128/70 | Temp 98.1°F | Ht 72.0 in | Wt 285.0 lb

## 2020-06-29 DIAGNOSIS — I2510 Atherosclerotic heart disease of native coronary artery without angina pectoris: Secondary | ICD-10-CM

## 2020-06-29 DIAGNOSIS — J301 Allergic rhinitis due to pollen: Secondary | ICD-10-CM | POA: Insufficient documentation

## 2020-06-29 DIAGNOSIS — E782 Mixed hyperlipidemia: Secondary | ICD-10-CM

## 2020-06-29 MED ORDER — FLUTICASONE PROPIONATE 50 MCG/ACT NA SUSP: 1.0000 | Freq: Every day | NASAL | 6 refills | Status: AC | PRN

## 2020-06-29 MED ORDER — NITROGLYCERIN 0.4 MG SL SUBL: 0.4000 mg | SUBLINGUAL_TABLET | SUBLINGUAL | 2 refills | Status: DC | PRN

## 2020-06-29 NOTE — Progress Notes (Signed)
Established Patient Office Visit  Subjective:  Patient ID: Aaron Franco, male    DOB: 08-29-1971  Age: 49 y.o. MRN: [id number]  CC:  Chief Complaint  Patient presents with  . Follow-up    3 month follow up patient wouldlike rrfill on medicatons     HPI Aaron Franco presents for follow-up of his hypertension that is currently well controlled.  He is working with endocrinology for better control of his diabetes.  He has been able to lose 45 pounds and continues to work 2 jobs.  Between the 2 jobs he is quite active.  Planning on returning for his fasting lipid profile that is been preordered.  Cardiology had suggested adding Zetia.  Postnasal drip sneezing and nasal congestion have been relieved with Flonase he would like to stay on it.  He needs a refill for his nitroglycerin tablets.  Current bottle has expired.  He has not had any chest pain shortness of breath or need for the tablets but likes to have dental health.  Past Medical History:  Diagnosis Date  . CAD (coronary artery disease)    a. 05/2013 NSTEMI/PCI: mCFX 100%-> 3.0 mm  x 20 mm Promus premier DES, EF 65%;  01/2014 Cath: LM nl, LAD 20-30d, LCX patent distal stent, RCA 30-21m, EF 55-65%.  . Diabetes mellitus   . Essential hypertension   . GERD (gastroesophageal reflux disease)   . Gout   . History of echocardiogram    a. 03/2011 Echo: EF 65%, mild LVH, no rwma, mildly dil LA.  Marland Kitchen Hyperlipidemia   . MI, old   . Obesity     Past Surgical History:  Procedure Laterality Date  . BICEPS TENDON REPAIR  Nov 13 2010   right  . HAND SURGERY  1990   left  . LEFT HEART CATH N/A 05/12/2013   Procedure: LEFT HEART CATH;  Surgeon: Lesleigh Noe, MD;  Location: [facility] CATH LAB;  Service: Cardiovascular;  Laterality: N/A;  . LEFT HEART CATHETERIZATION WITH CORONARY ANGIOGRAM N/A 01/24/2014   Procedure: LEFT HEART CATHETERIZATION WITH CORONARY ANGIOGRAM;  Surgeon: Peter M Swaziland, MD;  Location: [facility] CATH LAB;  Service:  Cardiovascular;  Laterality: N/A;  . PERCUTANEOUS CORONARY STENT INTERVENTION (PCI-S)  05/2013   mCx 3.93mm x 20 mm Promus P DES  . PERCUTANEOUS CORONARY STENT INTERVENTION (PCI-S)  05/12/2013   Procedure: PERCUTANEOUS CORONARY STENT INTERVENTION (PCI-S);  Surgeon: Lesleigh Noe, MD;  Location: [facility] CATH LAB;  Service: Cardiovascular;;    Family History  Problem Relation Age of Onset  . Heart attack Father        deceased  . Diabetes Father   . Congestive Heart Failure Father   . Stroke Mother   . Pancreatic cancer Maternal Aunt   . Colon cancer Neg Hx     Social History   Socioeconomic History  . Marital status: Married    Spouse name: Not on file  . Number of children: 2  . Years of education: Not on file  . Highest education level: Not on file  Occupational History  . Occupation: Insurance underwriter: [employer]  . Occupation: Multimedia programmer: Runner, broadcasting/film/video  Tobacco Use  . Smoking status: Former Smoker    Types: Cigarettes    Quit date: 11/10/1998    Years since quitting: 21.6  . Smokeless tobacco: Never Used  . Tobacco comment: pt only smoked 1-2 cigs a day, not everyday on occs x  2 years  Vaping Use  . Vaping Use: Never used  Substance and Sexual Activity  . Alcohol use: Yes    Comment: rare  . Drug use: No  . Sexual activity: Yes  Other Topics Concern  . Not on file  Social History Narrative  . Not on file   Social Determinants of Health   Financial Resource Strain:   . Difficulty of Paying Living Expenses: Not on file  Food Insecurity:   . Worried About Programme researcher, broadcasting/film/video in the Last Year: Not on file  . Ran Out of Food in the Last Year: Not on file  Transportation Needs:   . Lack of Transportation (Medical): Not on file  . Lack of Transportation (Non-Medical): Not on file  Physical Activity:   . Days of Exercise per Week: Not on file  . Minutes of Exercise per Session: Not on file  Stress:   . Feeling of Stress : Not on file   Social Connections:   . Frequency of Communication with Friends and Family: Not on file  . Frequency of Social Gatherings with Friends and Family: Not on file  . Attends Religious Services: Not on file  . Active Member of Clubs or Organizations: Not on file  . Attends Banker Meetings: Not on file  . Marital Status: Not on file  Intimate Partner Violence:   . Fear of Current or Ex-Partner: Not on file  . Emotionally Abused: Not on file  . Physically Abused: Not on file  . Sexually Abused: Not on file    Outpatient Medications Prior to Visit  Medication Sig Dispense Refill  . acetaminophen (TYLENOL) 325 MG tablet Take 2 tablets (650 mg total) by mouth every 4 (four) hours as needed for headache or mild pain.    Marland Kitchen allopurinol (ZYLOPRIM) 100 MG tablet TAKE 1 TABLET(100 MG) BY MOUTH DAILY 90 tablet 0  . amLODipine (NORVASC) 10 MG tablet Take 10 mg by mouth daily.    Marland Kitchen aspirin 81 MG chewable tablet Chew 1 tablet (81 mg total) by mouth daily.    . benzonatate (TESSALON) 200 MG capsule Take 200 mg by mouth 2 (two) times daily as needed for cough.     . cetirizine (ZYRTEC) 10 MG tablet Take by mouth.    . dapagliflozin propanediol (FARXIGA) 10 MG TABS tablet Take 10 mg by mouth daily before breakfast. 90 tablet 1  . dexlansoprazole (DEXILANT) 60 MG capsule Take 1 capsule (60 mg total) by mouth daily. 90 capsule 2  . gabapentin (NEURONTIN) 300 MG capsule Take one at night for one week and then increase to one twice daily. 60 capsule 3  . insulin degludec (TRESIBA FLEXTOUCH) 100 UNIT/ML FlexTouch Pen Inject 0.8 mLs (80 Units total) into the skin daily. 30 mL 3  . nebivolol (BYSTOLIC) 5 MG tablet Take 1 tablet (5 mg total) by mouth daily. 90 tablet 1  . Omega-3 Fatty Acids (FISH OIL) 1000 MG CAPS Take 1,000 mg by mouth 2 (two) times a day.    . rosuvastatin (CRESTOR) 40 MG tablet Take 40 mg by mouth daily.    Marland Kitchen UNABLE TO FIND Med Name: livago meter    . vitamin B-12 (CYANOCOBALAMIN)  500 MCG tablet Take 1 tablet (500 mcg total) by mouth daily. 90 tablet 1  . fluticasone (FLONASE) 50 MCG/ACT nasal spray Place 1 spray into both nostrils daily as needed for allergies or rhinitis.     Marland Kitchen nitroGLYCERIN (NITROSTAT) 0.4 MG SL tablet  Place 1 tablet (0.4 mg total) under the tongue every 5 (five) minutes x 3 doses as needed for chest pain. 25 tablet 2   No facility-administered medications prior to visit.    Allergies  Allergen Reactions  . Influenza Vaccines Anaphylaxis    Reaction June 2017  . Fenofibrate Other (See Comments)    Hot flashes  . Metformin Other (See Comments)    Chest discomfort  . Prednisone Other (See Comments)    Mood swings    ROS Review of Systems  Constitutional: Negative for diaphoresis, fatigue, fever and unexpected weight change.  HENT: Positive for congestion and postnasal drip.   Eyes: Negative for photophobia and visual disturbance.  Respiratory: Negative.  Negative for chest tightness and shortness of breath.   Cardiovascular: Negative for chest pain.  Gastrointestinal: Negative.   Endocrine: Negative for polyphagia and polyuria.  Genitourinary: Negative.   Musculoskeletal: Negative for gait problem and joint swelling.  Allergic/Immunologic: Negative for immunocompromised state.  Neurological: Negative.   Psychiatric/Behavioral: Negative.       Objective:    Physical Exam Vitals and nursing note reviewed.  Constitutional:      General: He is not in acute distress.    Appearance: He is not ill-appearing, toxic-appearing or diaphoretic.  HENT:     Head: Normocephalic and atraumatic.     Right Ear: External ear normal.     Left Ear: External ear normal.  Eyes:     General: No scleral icterus.       Right eye: No discharge.        Left eye: No discharge.     Conjunctiva/sclera: Conjunctivae normal.  Pulmonary:     Effort: Pulmonary effort is normal.  Neurological:     Mental Status: He is alert and oriented to person, place,  and time.  Psychiatric:        Mood and Affect: Mood normal.        Behavior: Behavior normal.     BP 128/70 Comment: per patient  Temp 98.1 F (36.7 C) (Oral)   Ht 6' (1.829 m)   Wt 285 lb (129.3 kg)   BMI 38.65 kg/m  Wt Readings from Last 3 Encounters:  06/29/20 285 lb (129.3 kg)  05/11/20 280 lb (127 kg)  03/22/20 285 lb 12.8 oz (129.6 kg)     Health Maintenance Due  Topic Date Due  . Hepatitis C Screening  Never done  . PNEUMOCOCCAL POLYSACCHARIDE VACCINE AGE 45-64 HIGH RISK  Never done  . OPHTHALMOLOGY EXAM  Never done  . COVID-19 Vaccine (1) Never done  . TETANUS/TDAP  09/26/2018  . INFLUENZA VACCINE  06/10/2020    There are no preventive care reminders to display for this patient.  Lab Results  Component Value Date   TSH 1.16 03/13/2020   Lab Results  Component Value Date   WBC 7.9 03/13/2020   HGB 14.0 03/13/2020   HCT 41.8 03/13/2020   MCV 79.7 03/13/2020   PLT 296.0 03/13/2020   Lab Results  Component Value Date   NA 136 03/13/2020   K 4.1 03/13/2020   CO2 28 03/13/2020   GLUCOSE 199 (H) 03/13/2020   BUN 14 03/13/2020   CREATININE 0.76 03/13/2020   BILITOT 0.5 03/13/2020   ALKPHOS 80 03/13/2020   AST 20 03/13/2020   ALT 20 03/13/2020   PROT 7.2 03/13/2020   ALBUMIN 4.2 03/13/2020   CALCIUM 9.9 03/13/2020   ANIONGAP 13 12/26/2019   GFR 131.52 03/13/2020   Lab Results  Component Value Date   CHOL 245 (H) 03/13/2020   Lab Results  Component Value Date   HDL 34.80 (L) 03/13/2020   Lab Results  Component Value Date   LDLCALC Comment 06/08/2018   Lab Results  Component Value Date   TRIG (H) 03/13/2020    448.0 Triglyceride is over 400; calculations on Lipids are invalid.   Lab Results  Component Value Date   CHOLHDL 7 03/13/2020   Lab Results  Component Value Date   HGBA1C 12.4 (H) 03/13/2020      Assessment & Plan:   Problem List Items Addressed This Visit      Cardiovascular and Mediastinum   Coronary artery disease  involving native heart without angina pectoris   Relevant Medications   nitroGLYCERIN (NITROSTAT) 0.4 MG SL tablet     Respiratory   Allergic rhinitis due to pollen - Primary   Relevant Medications   fluticasone (FLONASE) 50 MCG/ACT nasal spray     Other   Elevated triglycerides with high cholesterol   Relevant Medications   nitroGLYCERIN (NITROSTAT) 0.4 MG SL tablet      Meds ordered this encounter  Medications  . nitroGLYCERIN (NITROSTAT) 0.4 MG SL tablet    Sig: Place 1 tablet (0.4 mg total) under the tongue every 5 (five) minutes x 3 doses as needed for chest pain.    Dispense:  25 tablet    Refill:  2  . fluticasone (FLONASE) 50 MCG/ACT nasal spray    Sig: Place 1 spray into both nostrils daily as needed for allergies or rhinitis.    Dispense:  18.2 mL    Refill:  6    Follow-up: No follow-ups on file.  Continue current medications.  Mliss Sax, MD   Follow for repeat fasting lipid profile. Consider Zetia prn.   Virtual Visit via Video Note  I connected with [name] on 06/29/20 at 11:00 AM EDT by a video enabled telemedicine application and verified that I am speaking with the correct person using two identifiers.  Location: Patient: home alone.  Provider:    I discussed the limitations of evaluation and management by telemedicine and the availability of in person appointments. The patient expressed understanding and agreed to proceed.  History of Present Illness:    Observations/Objective:   Assessment and Plan:   Follow Up Instructions:    I discussed the assessment and treatment plan with the patient. The patient was provided an opportunity to ask questions and all were answered. The patient agreed with the plan and demonstrated an understanding of the instructions.   The patient was advised to call back or seek an in-person evaluation if the symptoms worsen or if the condition fails to improve as anticipated.  I provided home  alone minutes of non-face-to-face time during this encounter.   Mliss Sax, MD

## 2020-08-10 ENCOUNTER — Ambulatory Visit: Payer: Self-pay | Admitting: *Deleted

## 2020-08-10 NOTE — Telephone Encounter (Signed)
Patient's wife is calling because the patient was exposed to someone that tested positve for COVID at work. Would like to know what should the husband do? Patient's wife reports patient's symptoms are feeling tired , achey, cough. Exposure to covid + person was on or around 08/02/20 at work. Patient does wear mask at work but not at lunch. Instructed patient's wife to monitor symptoms and her and patient should be tested. Patient's wife reports she is going to call patient's PCP and call back to make testing appt if needed. Reviewed isolation precautions. Care advise given. Patient's wife verbalized understanding of care advise and to get tested and to call back or go to [facility] or ED if symptoms worsen.   Reason for Disposition . COVID-19 Testing, questions about  Answer Assessment - Initial Assessment Questions 1. COVID-19 EXPOSURE: "Please describe how you were exposed to someone with a COVID-19 infection."     At work 2. PLACE of CONTACT: "Where were you when you were exposed to COVID-19?" (e.g., home, school, medical waiting room; which city?)     Work  3. TYPE of CONTACT: "How much contact was there?" (e.g., sitting next to, live in same house, work in same office, same building)   Sitting next to at lunch  4. DURATION of CONTACT: "How long were you in contact with the COVID-19 patient?" (e.g., a few seconds, passed by person, a few minutes, 15 minutes or longer, live with the patient)     Not sure  5. MASK: "Were you wearing a mask?" "Was the other person wearing a mask?" Note: wearing a mask reduces the risk of an otherwise close contact.     No not at lunch  6. DATE of CONTACT: "When did you have contact with a COVID-19 patient?" (e.g., how many days ago)     08/02/20 7. COMMUNITY SPREAD: "Are there lots of cases of COVID-19 (community spread) where you live?" (See public health department website, if unsure)       No  8. SYMPTOMS: "Do you have any symptoms?" (e.g., fever, cough, breathing  difficulty, loss of taste or smell)     Tired ache, cough  9. PREGNANCY OR POSTPARTUM: "Is there any chance you are pregnant?" "When was your last menstrual period?" "Did you deliver in the last 2 weeks?"     na 10. HIGH RISK: "Do you have any heart or lung problems?" "Do you have a weak immune system?" (e.g., heart failure, COPD, asthma, HIV positive, chemotherapy, renal failure, diabetes mellitus, sickle cell anemia, obesity)       Diabetic  11. TRAVEL: "Have you traveled out of the country recently?" If Yes, ask: "When and where?" Also ask about out-of-state travel, since the CDC has identified some high-risk cities for community spread in the [place]. Note: Travel becomes less relevant if there is widespread community transmission where the patient lives.       na  Protocols used: CORONAVIRUS (COVID-19) EXPOSURE-A-AH

## 2020-08-21 ENCOUNTER — Encounter: Payer: Self-pay | Admitting: Nurse Practitioner

## 2020-08-21 ENCOUNTER — Ambulatory Visit (INDEPENDENT_AMBULATORY_CARE_PROVIDER_SITE_OTHER): Payer: BC Managed Care – PPO | Admitting: Nurse Practitioner

## 2020-08-21 ENCOUNTER — Other Ambulatory Visit: Payer: Self-pay

## 2020-08-21 VITALS — BP 136/80 | HR 88 | Temp 97.6°F | Ht 72.0 in | Wt 276.0 lb

## 2020-08-21 DIAGNOSIS — R102 Pelvic and perineal pain: Secondary | ICD-10-CM | POA: Diagnosis not present

## 2020-08-21 NOTE — Patient Instructions (Addendum)
Continue tylenol and miralax as needed Go to lab for urine collection.

## 2020-08-21 NOTE — Progress Notes (Signed)
Subjective:  Patient ID: Aaron Franco, male    DOB: 29-Sep-1971  Age: 49 y.o. MRN: [id number]  CC: Acute Visit (Pt co left back pain that travels into his groin area x2 days)  Abdominal Pain This is a new problem. The current episode started yesterday. The onset quality is sudden. The problem occurs constantly. The problem has been gradually improving. The pain is located in the suprapubic region. The pain is mild. The quality of the pain is colicky and a sensation of fullness. The abdominal pain radiates to the LLQ and back. Associated symptoms include constipation. Pertinent negatives include no anorexia, arthralgias, belching, diarrhea, dysuria, fever, flatus, frequency, headaches, hematochezia, hematuria, melena, myalgias, nausea, vomiting or weight loss. The pain is aggravated by movement. The pain is relieved by being still. He has tried acetaminophen (and miralax) for the symptoms. The treatment provided significant relief. There is no history of gallstones, GERD, irritable bowel syndrome, pancreatitis, PUD or ulcerative colitis.  last BM this morning: no blood in stool, no rectal pain  Reviewed past Medical, Social and Family history today.  Outpatient Medications Prior to Visit  Medication Sig Dispense Refill  . acetaminophen (TYLENOL) 325 MG tablet Take 2 tablets (650 mg total) by mouth every 4 (four) hours as needed for headache or mild pain.    Marland Kitchen allopurinol (ZYLOPRIM) 100 MG tablet TAKE 1 TABLET(100 MG) BY MOUTH DAILY 90 tablet 0  . amLODipine (NORVASC) 10 MG tablet Take 10 mg by mouth daily.    Marland Kitchen aspirin 81 MG chewable tablet Chew 1 tablet (81 mg total) by mouth daily.    . dapagliflozin propanediol (FARXIGA) 10 MG TABS tablet Take 10 mg by mouth daily before breakfast. 90 tablet 1  . dexlansoprazole (DEXILANT) 60 MG capsule Take 1 capsule (60 mg total) by mouth daily. 90 capsule 2  . fluticasone (FLONASE) 50 MCG/ACT nasal spray Place 1 spray into both nostrils daily as needed  for allergies or rhinitis. 18.2 mL 6  . gabapentin (NEURONTIN) 300 MG capsule Take one at night for one week and then increase to one twice daily. 60 capsule 3  . insulin degludec (TRESIBA FLEXTOUCH) 100 UNIT/ML FlexTouch Pen Inject 0.8 mLs (80 Units total) into the skin daily. 30 mL 3  . nebivolol (BYSTOLIC) 5 MG tablet Take 1 tablet (5 mg total) by mouth daily. 90 tablet 1  . nitroGLYCERIN (NITROSTAT) 0.4 MG SL tablet Place 1 tablet (0.4 mg total) under the tongue every 5 (five) minutes x 3 doses as needed for chest pain. 25 tablet 2  . Omega-3 Fatty Acids (FISH OIL) 1000 MG CAPS Take 1,000 mg by mouth 2 (two) times a day.    . rosuvastatin (CRESTOR) 40 MG tablet Take 40 mg by mouth daily.    Marland Kitchen UNABLE TO FIND Med Name: livago meter    . benzonatate (TESSALON) 200 MG capsule Take 200 mg by mouth 2 (two) times daily as needed for cough.  (Patient not taking: Reported on 08/21/2020)    . cetirizine (ZYRTEC) 10 MG tablet Take by mouth. (Patient not taking: Reported on 08/21/2020)    . vitamin B-12 (CYANOCOBALAMIN) 500 MCG tablet Take 1 tablet (500 mcg total) by mouth daily. (Patient not taking: Reported on 08/21/2020) 90 tablet 1   No facility-administered medications prior to visit.    ROS See HPI  Objective:  BP 136/80 (BP Location: Left Arm, Patient Position: Sitting, Cuff Size: Normal)   Pulse 88   Temp 97.6 F (36.4 C) (Temporal)  Ht 6' (1.829 m)   Wt 276 lb (125.2 kg)   SpO2 99%   BMI 37.43 kg/m   Physical Exam Constitutional:      General: He is not in acute distress.    Appearance: He is obese.  Cardiovascular:     Rate and Rhythm: Normal rate.     Pulses: Normal pulses.  Abdominal:     General: Bowel sounds are normal. There is no distension.     Palpations: Abdomen is soft.     Tenderness: There is no abdominal tenderness. There is no right CVA tenderness, left CVA tenderness or guarding.     Hernia: There is no hernia in the left inguinal area or right inguinal area.   Musculoskeletal:        General: No swelling or tenderness.  Lymphadenopathy:     Lower Body: No right inguinal adenopathy. No left inguinal adenopathy.  Skin:    General: Skin is warm and dry.     Findings: No erythema or rash.  Neurological:     Mental Status: He is alert.  Psychiatric:        Mood and Affect: Mood normal.        Behavior: Behavior normal.    Assessment & Plan:  This visit occurred during the SARS-CoV-2 public health emergency.  Safety protocols were in place, including screening questions prior to the visit, additional usage of staff PPE, and extensive cleaning of exam room while observing appropriate contact time as indicated for disinfecting solutions.   [name] was seen today for acute visit.  Diagnoses and all orders for this visit:  Suprapubic pain, acute -     Urinalysis w microscopic + reflex cultur  Continue tylenol and miralax as needed Go to lab for urine collection.  Problem List Items Addressed This Visit    None    Visit Diagnoses    Suprapubic pain, acute    -  Primary   Relevant Orders   Urinalysis w microscopic + reflex cultur     Follow-up: No follow-ups on file.  Alysia Penna, NP

## 2020-08-22 LAB — URINALYSIS W MICROSCOPIC + REFLEX CULTURE
Bacteria, UA: NONE SEEN /HPF
Bilirubin Urine: NEGATIVE
Hgb urine dipstick: NEGATIVE
Hyaline Cast: NONE SEEN /LPF
Leukocyte Esterase: NEGATIVE
Nitrites, Initial: NEGATIVE
RBC / HPF: NONE SEEN /HPF (ref 0–2)
Specific Gravity, Urine: 1.037 — ABNORMAL HIGH (ref 1.001–1.03)
Squamous Epithelial / HPF: NONE SEEN /HPF (ref <=5)
WBC, UA: NONE SEEN /HPF (ref 0–5)
pH: 6.5 (ref 5.0–8.0)

## 2020-08-22 LAB — NO CULTURE INDICATED

## 2020-08-23 ENCOUNTER — Encounter: Payer: Self-pay | Admitting: Internal Medicine

## 2020-10-02 ENCOUNTER — Telehealth: Payer: Self-pay | Admitting: Cardiology

## 2020-10-02 NOTE — Telephone Encounter (Signed)
Form received from [facility] on 10/02/2020. Forms put in Dr.Hochrein mailbox on 10/02/2020.

## 2020-10-08 ENCOUNTER — Telehealth: Payer: Self-pay

## 2020-10-08 MED ORDER — PANTOPRAZOLE SODIUM 40 MG PO TBEC: 40.0000 mg | DELAYED_RELEASE_TABLET | Freq: Every day | ORAL | 3 refills | Status: DC

## 2020-10-08 NOTE — Addendum Note (Signed)
Addended by: Worthy Rancher B on: 10/08/2020 12:45 PM   Modules accepted: Orders

## 2020-10-08 NOTE — Telephone Encounter (Signed)
lft VM to rtn call to notify patient of medication change.  Dm/cma

## 2020-10-08 NOTE — Telephone Encounter (Signed)
Patient advised of medication change and states that protonix doesn't work for him and will get with his wife and then call us back.  Dm/cma

## 2020-10-08 NOTE — Telephone Encounter (Signed)
Received a drug change request for Dexilant 60 mg Per Liliane Shi is no longer covered by plan.  The preferred alternative is Pantoprazole tabs, Omeprazole, Lansoprazole caps, Rabeprazole tabs.  Please review and advise.  Thanks.  Dm/cma

## 2020-10-23 NOTE — Telephone Encounter (Signed)
Per Dr. Antoine Poche unable to fill out forms as patient is not disabled from a cardiac standpoint. Patient will call back with PCPs fax for forms to be sent to them.

## 2020-10-26 NOTE — Telephone Encounter (Signed)
Wife of patient called back to provide Dr. Antoine Poche with the fax number to send form to PCP.  [phone] ATTN: Dr. Doreene Burke   Wife also wanted to know if the office would just mail a check for the $29 refund. Address has been verified in the patient's chart

## 2020-10-29 NOTE — Telephone Encounter (Signed)
Patient forms were faxed to PCP on 12/20. Patient was informed to pick up original cash payment at [facility] front desk.

## 2020-11-21 ENCOUNTER — Encounter: Payer: Self-pay | Admitting: Internal Medicine

## 2020-11-28 ENCOUNTER — Telehealth: Payer: Self-pay | Admitting: Cardiology

## 2020-11-28 ENCOUNTER — Telehealth: Payer: Self-pay

## 2020-11-28 NOTE — Telephone Encounter (Signed)
Pt and wife called in.  He is need new FMLA paperwork filled out for this year for his job.  Pt has an appt Friday but is concerned about the weather.  They would like to know if it is necessary for him to come in for this ?    Best number [phone] or [phone]

## 2020-11-28 NOTE — Telephone Encounter (Signed)
Pt's wife calling to inquire about paper work for [employer].   Pt would like to know what is needed to be done from this point on to have paper work filled out or/and if he need to have a visit with a provider to get this accomplished.  Wife would like a call back to know the status of what has been done or what needs to be done.  Please advise [phone]

## 2020-11-28 NOTE — Telephone Encounter (Signed)
I don't have anyway of signing anything like that electronically.  Is the paperwork in our office?

## 2020-11-28 NOTE — Telephone Encounter (Signed)
Returned call no answer unable to leave message will call back.  

## 2020-11-28 NOTE — Telephone Encounter (Signed)
Called patient, no answer. Left message requesting patient come to office and bring paperwork for completion. Advised patient to call back with any questions.

## 2020-11-29 NOTE — Progress Notes (Signed)
Cardiology Office Note   Date:  11/30/2020   ID:  Aaron Franco, DOB [birth year], MRN [id number]  PCP:  Mliss Sax, MD  Cardiologist:   Rollene Rotunda, MD   Chief Complaint  Patient presents with  . Coronary Artery Disease      History of Present Illness: Aaron Franco is a 50 y.o. male who presents for follow up of CAD.  He has a history of of STEMI 2014 w/ DES CFX.  Cath demonstrated non obstructive disease in 2015.  He had chest pain in 2018 and a negative perfusion study.   We saw him in Feb 2021 he was in the ED with chest pain.  He had a low risk perfusion study.   Since I last saw him he does occasionally get some chest discomfort but he associates this with food and it goes away with antacids. He does not have any exertional chest discomfort, neck or arm discomfort. He does not have any new shortness of breath, PND or orthopnea. He has no new palpitations, presyncope or syncope. He has had no weight gain or edema. It does not sound like his blood sugar has been well controlled.   Past Medical History:  Diagnosis Date  . CAD (coronary artery disease)    a. 05/2013 NSTEMI/PCI: mCFX 100%-> 3.0 mm  x 20 mm Promus premier DES, EF 65%;  01/2014 Cath: LM nl, LAD 20-30d, LCX patent distal stent, RCA 30-65m, EF 55-65%.  . Diabetes mellitus   . Essential hypertension   . GERD (gastroesophageal reflux disease)   . Gout   . History of echocardiogram    a. 03/2011 Echo: EF 65%, mild LVH, no rwma, mildly dil LA.  Marland Kitchen Hyperlipidemia   . MI, old   . Obesity     Past Surgical History:  Procedure Laterality Date  . BICEPS TENDON REPAIR  Nov 13 2010   right  . HAND SURGERY  1990   left  . LEFT HEART CATH N/A 05/12/2013   Procedure: LEFT HEART CATH;  Surgeon: Lesleigh Noe, MD;  Location[facility] CATH LAB;  Service: Cardiovascular;  Laterality: N/A;  . LEFT HEART CATHETERIZATION WITH CORONARY ANGIOGRAM N/A 01/24/2014   Procedure: LEFT HEART CATHETERIZATION WITH CORONARY  ANGIOGRAM;  Surgeon: Peter M Swaziland, MD;  Location: [facility] CATH LAB;  Service: Cardiovascular;  Laterality: N/A;  . PERCUTANEOUS CORONARY STENT INTERVENTION (PCI-S)  05/2013   mCx 3.31mm x 20 mm Promus P DES  . PERCUTANEOUS CORONARY STENT INTERVENTION (PCI-S)  05/12/2013   Procedure: PERCUTANEOUS CORONARY STENT INTERVENTION (PCI-S);  Surgeon: Lesleigh Noe, MD;  Location: [facility] CATH LAB;  Service: Cardiovascular;;     Current Outpatient Medications  Medication Sig Dispense Refill  . acetaminophen (TYLENOL) 325 MG tablet Take 2 tablets (650 mg total) by mouth every 4 (four) hours as needed for headache or mild pain.    Marland Kitchen allopurinol (ZYLOPRIM) 100 MG tablet TAKE 1 TABLET(100 MG) BY MOUTH DAILY 90 tablet 0  . amLODipine (NORVASC) 10 MG tablet Take 10 mg by mouth daily.    Marland Kitchen aspirin 81 MG chewable tablet Chew 1 tablet (81 mg total) by mouth daily.    . benzonatate (TESSALON) 200 MG capsule Take 200 mg by mouth 2 (two) times daily as needed for cough.    . cetirizine (ZYRTEC) 10 MG tablet Take by mouth.    . dapagliflozin propanediol (FARXIGA) 10 MG TABS tablet Take 10 mg by mouth daily before breakfast. 90 tablet 1  .  fluticasone (FLONASE) 50 MCG/ACT nasal spray Place 1 spray into both nostrils daily as needed for allergies or rhinitis. 18.2 mL 6  . gabapentin (NEURONTIN) 300 MG capsule Take one at night for one week and then increase to one twice daily. 60 capsule 3  . insulin degludec (TRESIBA FLEXTOUCH) 100 UNIT/ML FlexTouch Pen Inject 0.8 mLs (80 Units total) into the skin daily. 30 mL 3  . nebivolol (BYSTOLIC) 5 MG tablet Take 1 tablet (5 mg total) by mouth daily. 90 tablet 1  . nitroGLYCERIN (NITROSTAT) 0.4 MG SL tablet Place 1 tablet (0.4 mg total) under the tongue every 5 (five) minutes x 3 doses as needed for chest pain. 25 tablet 2  . Omega-3 Fatty Acids (FISH OIL) 1000 MG CAPS Take 1,000 mg by mouth 2 (two) times a day.    . pantoprazole (PROTONIX) 40 MG tablet Take 1 tablet (40 mg total) by  mouth daily. 30 tablet 3  . rosuvastatin (CRESTOR) 40 MG tablet Take 40 mg by mouth daily.    Marland Kitchen UNABLE TO FIND Med Name: livago meter    . vitamin B-12 (CYANOCOBALAMIN) 500 MCG tablet Take 1 tablet (500 mcg total) by mouth daily. 90 tablet 1   No current facility-administered medications for this visit.    Allergies:   Influenza vaccines, Fenofibrate, Metformin, and Prednisone    ROS:  Please see the history of present illness.   Otherwise, review of systems are positive for none.   All other systems are reviewed and negative.    PHYSICAL EXAM: VS:  BP (!) 148/84   Pulse 89   Ht 6\' 2"  (1.88 m)   Wt 275 lb (124.7 kg)   SpO2 99%   BMI 35.31 kg/m  , BMI Body mass index is 35.31 kg/m. GENERAL:  Well appearing NECK:  No jugular venous distention, waveform within normal limits, carotid upstroke brisk and symmetric, no bruits, no thyromegaly LUNGS:  Clear to auscultation bilaterally CHEST:  Unremarkable HEART:  PMI not displaced or sustained,S1 and S2 within normal limits, no S3, no S4, no clicks, no rubs, no murmurs ABD:  Flat, positive bowel sounds normal in frequency in pitch, no bruits, no rebound, no guarding, no midline pulsatile mass, no hepatomegaly, no splenomegaly EXT:  2 plus pulses throughout, no edema, no cyanosis no clubbing   EKG:  EKG is ordered today. The ekg ordered today demonstrates sinus rhythm, rate 89, axis within normal limits, intervals within normal limits, no acute ST-T wave changes.   Recent Labs: 03/13/2020: ALT 20; BUN 14; Creatinine, Ser 0.76; Hemoglobin 14.0; Platelets 296.0; Potassium 4.1; Sodium 136; TSH 1.16    Lipid Panel    Component Value Date/Time   CHOL 245 (H) 03/13/2020 1636   CHOL 195 06/08/2018 1213   TRIG (H) 03/13/2020 1636    448.0 Triglyceride is over 400; calculations on Lipids are invalid.   HDL 34.80 (L) 03/13/2020 1636   HDL 25 (L) 06/08/2018 1213   CHOLHDL 7 03/13/2020 1636   VLDL UNABLE TO CALCULATE IF TRIGLYCERIDE OVER  400 mg/dL 05/13/2020 88/32/5498   LDLCALC Comment 06/08/2018 1213   LDLDIRECT 110.0 03/13/2020 1636      Wt Readings from Last 3 Encounters:  11/30/20 275 lb (124.7 kg)  08/21/20 276 lb (125.2 kg)  06/29/20 285 lb (129.3 kg)      Other studies Reviewed: Additional studies/ records that were reviewed today include: None. Review of the above records demonstrates:   NA   ASSESSMENT AND PLAN:  CAD:  The patient has no new sypmtoms.  No further cardiovascular testing is indicated.  We will continue with aggressive risk reduction and meds as listed.   Hypertension:   The blood pressure is at target.  No change in therapy.   Hyperlipidemia:   LDL was 110 last year. I will repeat this today. Of note his triglycerides were 647.   We talked a lot about diet today. Manage him otherwise based on the results of the blood work today. Goal LDL is less than 70.   DM2:    A1c was 12.4 and I do not see a more recent ones in May of last year. I will check 1. He is working with an endocrinologist and he understands the goals of therapy.  Current medicines are reviewed at length with the patient today.  The patient does not have concerns regarding medicines.  The following changes have been made:  no change  Labs/ tests ordered today include: None  Orders Placed This Encounter  Procedures  . Hemoglobin A1c  . Lipid panel  . Comprehensive metabolic panel  . EKG 12-Lead     Disposition:   FU with me in one year.     Signed, Rollene Rotunda, MD  11/30/2020 12:22 PM    [facility]

## 2020-11-30 ENCOUNTER — Ambulatory Visit (INDEPENDENT_AMBULATORY_CARE_PROVIDER_SITE_OTHER): Payer: BC Managed Care – PPO | Admitting: Cardiology

## 2020-11-30 ENCOUNTER — Encounter: Payer: Self-pay | Admitting: Cardiology

## 2020-11-30 ENCOUNTER — Other Ambulatory Visit: Payer: Self-pay

## 2020-11-30 VITALS — BP 148/84 | HR 89 | Ht 74.0 in | Wt 275.0 lb

## 2020-11-30 DIAGNOSIS — E118 Type 2 diabetes mellitus with unspecified complications: Secondary | ICD-10-CM | POA: Diagnosis not present

## 2020-11-30 DIAGNOSIS — I10 Essential (primary) hypertension: Secondary | ICD-10-CM | POA: Diagnosis not present

## 2020-11-30 DIAGNOSIS — E785 Hyperlipidemia, unspecified: Secondary | ICD-10-CM

## 2020-11-30 DIAGNOSIS — I2510 Atherosclerotic heart disease of native coronary artery without angina pectoris: Secondary | ICD-10-CM | POA: Diagnosis not present

## 2020-11-30 NOTE — Telephone Encounter (Signed)
Tried calling patients wife back, no answer called another number from contacts no answer unable to leave message on either number. Will call back later.

## 2020-11-30 NOTE — Patient Instructions (Signed)
Medication Instructions:  No changes *If you need a refill on your cardiac medications before your next appointment, please call your pharmacy*  Lab Work: Your physician recommends that you return for lab work today: A1C, Lipids, CMP If you have labs (blood work) drawn today and your tests are completely normal, you will receive your results only by:  MyChart Message (if you have MyChart) OR  A paper copy in the mail If you have any lab test that is abnormal or we need to change your treatment, we will call you to review the results.  Testing/Procedures: None ordered this visit  Follow-Up: At [facility] you and your health needs are our priority.  As part of our continuing mission to provide you with exceptional heart care, we have created designated Provider Care Teams.  These Care Teams include your primary Cardiologist (physician) and Advanced Practice Providers (APPs -  Physician Assistants and Nurse Practitioners) who all work together to provide you with the care you need, when you need it.  Your next appointment:   12 month(s)  You will receive a reminder letter in the mail two months in advance. If you don't receive a letter, please call our office to schedule the follow-up appointment.  The format for your next appointment:   In Person  Provider:   Rollene Rotunda, MD

## 2020-12-03 LAB — COMPREHENSIVE METABOLIC PANEL
ALT: 25 IU/L (ref 0–44)
AST: 24 IU/L (ref 0–40)
Albumin/Globulin Ratio: 1.3 (ref 1.2–2.2)
Albumin: 4.3 g/dL (ref 4.0–5.0)
Alkaline Phosphatase: 105 IU/L (ref 44–121)
BUN/Creatinine Ratio: 19 (ref 9–20)
BUN: 13 mg/dL (ref 6–24)
Bilirubin Total: <0.2 mg/dL (ref 0.0–1.2)
CO2: 23 mmol/L (ref 20–29)
Calcium: 10.1 mg/dL (ref 8.7–10.2)
Chloride: 98 mmol/L (ref 96–106)
Creatinine, Ser: 0.7 mg/dL — ABNORMAL LOW (ref 0.76–1.27)
GFR calc Af Amer: 127 mL/min/{1.73_m2} (ref >59)
GFR calc non Af Amer: 110 mL/min/{1.73_m2} (ref >59)
Globulin, Total: 3.3 g/dL (ref 1.5–4.5)
Glucose: 283 mg/dL — ABNORMAL HIGH (ref 65–99)
Potassium: 4.2 mmol/L (ref 3.5–5.2)
Sodium: 136 mmol/L (ref 134–144)
Total Protein: 7.6 g/dL (ref 6.0–8.5)

## 2020-12-03 LAB — LIPID PANEL
Chol/HDL Ratio: 12 ratio — ABNORMAL HIGH (ref 0.0–5.0)
Cholesterol, Total: 384 mg/dL — ABNORMAL HIGH (ref 100–199)
HDL: 32 mg/dL — ABNORMAL LOW (ref >39)
Triglycerides: 1040 mg/dL (ref 0–149)

## 2020-12-03 LAB — HEMOGLOBIN A1C
Est. average glucose Bld gHb Est-mCnc: 358 mg/dL
Hgb A1c MFr Bld: 14.1 % — ABNORMAL HIGH (ref 4.8–5.6)

## 2020-12-04 NOTE — Telephone Encounter (Signed)
Spoke with patients wife who states that patients cardiologist was supposed to have forms sent over but patient will [pick them up from work and fax over. Awaiting on forms to be faxed.

## 2020-12-05 NOTE — Telephone Encounter (Signed)
Patients wife calling states that patient has issues with gout and some heart issues that sometimes prevents him from showing up for work or being able to continue a work full day due to pain with gout. Patient employer faxed over disability forms to be filled out for patient to be able to leave or call out when gout is acting up. Patients last visit 08/21/20 seen for suprapublic pain and video visit 5/37/94 seen for allergies. Are you okay with seeing patient for issues with gout and filling out forms? Please advise.

## 2020-12-06 NOTE — Telephone Encounter (Signed)
Appointment scheduled.

## 2020-12-06 NOTE — Telephone Encounter (Signed)
I can see this patient for FMLA paperwork. I will need an in-person visit.

## 2020-12-07 ENCOUNTER — Telehealth: Payer: Self-pay | Admitting: Internal Medicine

## 2020-12-07 ENCOUNTER — Other Ambulatory Visit: Payer: Self-pay

## 2020-12-07 ENCOUNTER — Encounter: Payer: Self-pay | Admitting: Family Medicine

## 2020-12-07 ENCOUNTER — Ambulatory Visit (INDEPENDENT_AMBULATORY_CARE_PROVIDER_SITE_OTHER): Payer: BC Managed Care – PPO | Admitting: Family Medicine

## 2020-12-07 VITALS — BP 122/78 | HR 89 | Temp 97.7°F | Ht 74.0 in | Wt 272.6 lb

## 2020-12-07 DIAGNOSIS — E1140 Type 2 diabetes mellitus with diabetic neuropathy, unspecified: Secondary | ICD-10-CM

## 2020-12-07 DIAGNOSIS — E782 Mixed hyperlipidemia: Secondary | ICD-10-CM | POA: Diagnosis not present

## 2020-12-07 DIAGNOSIS — I2510 Atherosclerotic heart disease of native coronary artery without angina pectoris: Secondary | ICD-10-CM

## 2020-12-07 DIAGNOSIS — M1A09X0 Idiopathic chronic gout, multiple sites, without tophus (tophi): Secondary | ICD-10-CM

## 2020-12-07 DIAGNOSIS — E1165 Type 2 diabetes mellitus with hyperglycemia: Secondary | ICD-10-CM | POA: Insufficient documentation

## 2020-12-07 DIAGNOSIS — Z794 Long term (current) use of insulin: Secondary | ICD-10-CM

## 2020-12-07 DIAGNOSIS — Z029 Encounter for administrative examinations, unspecified: Secondary | ICD-10-CM

## 2020-12-07 DIAGNOSIS — Z9861 Coronary angioplasty status: Secondary | ICD-10-CM

## 2020-12-07 NOTE — Progress Notes (Signed)
[facility] [address] [place] [address] Dept: [phone] Dept Fax: [phone]  Acute Office Visit  Subjective:    Patient ID: [name], male    DOB: 04-Apr-1971, 50 y.o..   MRN: [id number]   Chief Complaint  Patient presents with  . Follow-up    Follow up on gout in toes and ankle. Needing FMLA forms filled out.     History of Present Illness:  Patient is in today for assistance for filing for reasonable accomodation for his job. 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 He notes that his job can be strenuous. He finds that, at times, his health conditions impair his ability to perform all of his work duties without usual assistance. He notes that staffing has not always been adequate, so there is an expectation for him to manage on his own work that should involve more than one Financial controller. He is requesting accomodation for assistance with work activities at times when his gout, diabetes, and/or CAD are effecting his work.  Mr. Allums has a history of gout and is managed with allopurinol. He states despite this he has about 1 flare a month lasting 1-3 days.  Mr. Montz has a history of Type 2 diabetes and is managed with insulin. He is followed for this condition by endocrinology. He admits to having numbness in both feet. He has also noted significant dryness to the feet.  Mr. Boulden has a CAD s/p CABG. He is followed by cardiology for this. He last saw his cardiologist last week and has had lab work since that time.  Past Medical History: Patient Active Problem List   Diagnosis Date Noted  . Type 2 diabetes mellitus with hyperglycemia, with long-term current use of insulin (HCC) 12/07/2020  . Allergic rhinitis due to pollen 06/29/2020  . Elevated triglycerides with high cholesterol 03/22/2020  . Neuropathy 03/13/2020  . Erectile dysfunction due to arterial insufficiency 03/13/2020  .  History of gout 11/28/2019  . Educated about COVID-19 virus infection 03/29/2019  . Coronary artery disease involving native heart without angina pectoris 03/29/2019  . Chest pain with moderate risk of acute coronary syndrome 09/17/2015  . Type 2 diabetes mellitus with diabetic neuropathy, unspecified (HCC) 04/23/2015  . Thrush 04/23/2015  . CAD S/P percutaneous coronary angioplasty -  01/24/2014  . Murmur 02/21/2011  . SINUSITIS - ACUTE-NOS 01/09/2011  . EDEMA- LOCALIZED 12/24/2010  . SHOULDER PAIN 10/16/2010  . VITAMIN B12 DEFICIENCY 07/19/2010  . Morbid obesity (HCC) 07/19/2010  . DECREASED LIBIDO 07/19/2010  . STRAIN, CHEST WALL 07/19/2010  . OTHER VITAMIN B12 DEFICIENCY ANEMIA 10/31/2009  . PAIN IN JOINT, HAND 10/16/2009  . TRIGGER FINGER 10/16/2009  . BACK STRAIN, LUMBAR 10/16/2009  . RHINITIS 02/06/2009  . Dyslipidemia 03/01/2007  . GOUT 03/01/2007  . Essential hypertension 03/01/2007  . Gastroesophageal reflux disease 03/01/2007   Past Surgical History:  Procedure Laterality Date  . BICEPS TENDON REPAIR  Nov 13 2010   right  . HAND SURGERY  1990   left  . LEFT HEART CATH N/A 05/12/2013   Procedure: LEFT HEART CATH;  Surgeon: Lesleigh Noe, MD;  Location: [facility] CATH LAB;  Service: Cardiovascular;  Laterality: N/A;  . LEFT HEART CATHETERIZATION WITH CORONARY ANGIOGRAM N/A 01/24/2014   Procedure: LEFT HEART CATHETERIZATION WITH CORONARY ANGIOGRAM;  Surgeon: Peter M Swaziland, MD;  Location: [facility] CATH LAB;  Service: Cardiovascular;  Laterality: N/A;  . PERCUTANEOUS CORONARY STENT INTERVENTION (PCI-S)  05/2013   mCx 3.38mm x 20 mm Promus P DES  . PERCUTANEOUS CORONARY STENT INTERVENTION (PCI-S)  05/12/2013   Procedure: PERCUTANEOUS CORONARY STENT INTERVENTION (PCI-S);  Surgeon: Lesleigh Noe, MD;  Location: [facility] CATH LAB;  Service: Cardiovascular;;   Family History  Problem Relation Age of Onset  . Heart attack Father        deceased  . Diabetes Father   . Congestive Heart  Failure Father   . Stroke Mother   . Pancreatic cancer Maternal Aunt   . Colon cancer Neg Hx    Outpatient Medications Prior to Visit  Medication Sig Dispense Refill  . acetaminophen (TYLENOL) 325 MG tablet Take 2 tablets (650 mg total) by mouth every 4 (four) hours as needed for headache or mild pain.    Marland Kitchen allopurinol (ZYLOPRIM) 100 MG tablet TAKE 1 TABLET(100 MG) BY MOUTH DAILY 90 tablet 0  . amLODipine (NORVASC) 10 MG tablet Take 10 mg by mouth daily.    Marland Kitchen aspirin 81 MG chewable tablet Chew 1 tablet (81 mg total) by mouth daily.    . benzonatate (TESSALON) 200 MG capsule Take 200 mg by mouth 2 (two) times daily as needed for cough.    . cetirizine (ZYRTEC) 10 MG tablet Take by mouth.    . dapagliflozin propanediol (FARXIGA) 10 MG TABS tablet Take 10 mg by mouth daily before breakfast. 90 tablet 1  . fluticasone (FLONASE) 50 MCG/ACT nasal spray Place 1 spray into both nostrils daily as needed for allergies or rhinitis. 18.2 mL 6  . gabapentin (NEURONTIN) 300 MG capsule Take one at night for one week and then increase to one twice daily. 60 capsule 3  . insulin degludec (TRESIBA FLEXTOUCH) 100 UNIT/ML FlexTouch Pen Inject 0.8 mLs (80 Units total) into the skin daily. 30 mL 3  . nebivolol (BYSTOLIC) 5 MG tablet Take 1 tablet (5 mg total) by mouth daily. 90 tablet 1  . nitroGLYCERIN (NITROSTAT) 0.4 MG SL tablet Place 1 tablet (0.4 mg total) under the tongue every 5 (five) minutes x 3 doses as needed for chest pain. 25 tablet 2  . Omega-3 Fatty Acids (FISH OIL) 1000 MG CAPS Take 1,000 mg by mouth 2 (two) times a day.    . pantoprazole (PROTONIX) 40 MG tablet Take 1 tablet (40 mg total) by mouth daily. 30 tablet 3  . rosuvastatin (CRESTOR) 40 MG tablet Take 40 mg by mouth daily.    Marland Kitchen UNABLE TO FIND Med Name: livago meter    . vitamin B-12 (CYANOCOBALAMIN) 500 MCG tablet Take 1 tablet (500 mcg total) by mouth daily. (Patient not taking: Reported on 12/07/2020) 90 tablet 1   No  facility-administered medications prior to visit.    Allergies  Allergen Reactions  . Influenza Vaccines Anaphylaxis    Reaction June 2017  . Fenofibrate Other (See Comments)    Hot flashes  . Metformin Other (See Comments)    Chest discomfort  . Prednisone Other (See Comments)    Mood swings     Objective:   Today's Vitals   12/07/20 1511  BP: 122/78  Pulse: 89  Temp: 97.7 F (36.5 C)  TempSrc: Temporal  SpO2: 96%  Weight: 272 lb 9.6 oz (123.7 kg)  Height: 6\' 2"  (1.88 m)   Body mass index is 35 kg/m.   General: Well developed, well nourished. No acute distress. Feet: there is scaliness across the entire sole of both feet. There are calluses on the medial aspect of both  1st toes. Multiple nails on   both feet demonstrate thickening of the nail with yellow discoloration and irregular crumbling of the nail. The skin appears intact and   there are no signs of redness. Psych: Alert and oriented x3. Normal mood and affect.  Lab Results Last lipids Lab Results  Component Value Date   CHOL 384 (H) 11/30/2020   HDL 32 (L) 11/30/2020   LDLCALC Comment (A) 11/30/2020   LDLDIRECT 110.0 03/13/2020   TRIG 1,040 (HH) 11/30/2020   CHOLHDL 12.0 (H) 11/30/2020   Last hemoglobin A1c Lab Results  Component Value Date   HGBA1C 14.1 (H) 11/30/2020     Chemistry      Component Value Date/Time   NA 136 11/30/2020 1233   K 4.2 11/30/2020 1233   CL 98 11/30/2020 1233   CO2 23 11/30/2020 1233   BUN 13 11/30/2020 1233   CREATININE 0.70 (L) 11/30/2020 1233      Component Value Date/Time   CALCIUM 10.1 11/30/2020 1233   ALKPHOS 105 11/30/2020 1233   AST 24 11/30/2020 1233   ALT 25 11/30/2020 1233   BILITOT <0.2 11/30/2020 1233       Assessment & Plan:   1. Type 2 diabetes mellitus with diabetic neuropathy, with long-term current use of insulin [facility]) Mr. Hofacker's diabetes is currently in very poor control. I strongly advised him to follow-up with his endocrinologist for  more intensive management of his diabetes. I expressed my concern for the potential complications that he might experience with ongoing hyperglycemia. We did review diabetic foot management. I strongly advised daily visual inspection of the feet, daily use of moisturizers, and to avoid going barefoot.  2. Idiopathic chronic gout of multiple sites without tophus Mr. Loveland has a history of gout that periodically causes flares with associated pain, primarily in the foot and ankle joints. He will continue on allopurinol.  3. Elevated triglycerides with high cholesterol Mr. Neault's total cholesterol and triglycerides are quite high. He admits to some adherence issues with his medications. I emphasized the importance of these, esp. In light of his known CAD. He is to follow-up with his cardiologist about the lipid results to consider the addition of Zetia to his current regimen.   4. CAD S/P percutaneous coronary angioplasty -  Stable, but occasionally contributes to fatigue with heavier work.  5. Encounter for administrative examinations There was some initial confusion about what paperwork Mr. Scioneaux employer needed. (FMLA vs. RA). We will have his work fax the appropriate form. I will complete this once we have it and fax back to his work.  Loyola Mast, MD

## 2020-12-07 NOTE — Patient Instructions (Signed)
Diabetes Mellitus and Foot Care Foot care is an important part of your health, especially when you have diabetes. Diabetes may cause you to have problems because of poor blood flow (circulation) to your feet and legs, which can cause your skin to:  Become thinner and drier.  Break more easily.  Heal more slowly.  Peel and crack. You may also have nerve damage (neuropathy) in your legs and feet, causing decreased feeling in them. This means that you may not notice minor injuries to your feet that could lead to more serious problems. Noticing and addressing any potential problems early is the best way to prevent future foot problems. How to care for your feet Foot hygiene  Wash your feet daily with warm water and mild soap. Do not use hot water. Then, pat your feet and the areas between your toes until they are completely dry. Do not soak your feet as this can dry your skin.  Trim your toenails straight across. Do not dig under them or around the cuticle. File the edges of your nails with an emery board or nail file.  Apply a moisturizing lotion or petroleum jelly to the skin on your feet and to dry, brittle toenails. Use lotion that does not contain alcohol and is unscented. Do not apply lotion between your toes.   Shoes and socks  Wear clean socks or stockings every day. Make sure they are not too tight. Do not wear knee-high stockings since they may decrease blood flow to your legs.  Wear shoes that fit properly and have enough cushioning. Always look in your shoes before you put them on to be sure there are no objects inside.  To break in new shoes, wear them for just a few hours a day. This prevents injuries on your feet. Wounds, scrapes, corns, and calluses  Check your feet daily for blisters, cuts, bruises, sores, and redness. If you cannot see the bottom of your feet, use a mirror or ask someone for help.  Do not cut corns or calluses or try to remove them with medicine.  If you  find a minor scrape, cut, or break in the skin on your feet, keep it and the skin around it clean and dry. You may clean these areas with mild soap and water. Do not clean the area with peroxide, alcohol, or iodine.  If you have a wound, scrape, corn, or callus on your foot, look at it several times a day to make sure it is healing and not infected. Check for: ? Redness, swelling, or pain. ? Fluid or blood. ? Warmth. ? Pus or a bad smell.   General tips  Do not cross your legs. This may decrease blood flow to your feet.  Do not use heating pads or hot water bottles on your feet. They may burn your skin. If you have lost feeling in your feet or legs, you may not know this is happening until it is too late.  Protect your feet from hot and cold by wearing shoes, such as at the beach or on hot pavement.  Schedule a complete foot exam at least once a year (annually) or more often if you have foot problems. Report any cuts, sores, or bruises to your health care provider immediately. Where to find more information  American Diabetes Association: www.diabetes.org  Association of Diabetes Care & Education Specialists: www.diabeteseducator.org Contact a health care provider if:  You have a medical condition that increases your risk of infection and   you have any cuts, sores, or bruises on your feet.  You have an injury that is not healing.  You have redness on your legs or feet.  You feel burning or tingling in your legs or feet.  You have pain or cramps in your legs and feet.  Your legs or feet are numb.  Your feet always feel cold.  You have pain around any toenails. Get help right away if:  You have a wound, scrape, corn, or callus on your foot and: ? You have pain, swelling, or redness that gets worse. ? You have fluid or blood coming from the wound, scrape, corn, or callus. ? Your wound, scrape, corn, or callus feels warm to the touch. ? You have pus or a bad smell coming from  the wound, scrape, corn, or callus. ? You have a fever. ? You have a red line going up your leg. Summary  Check your feet every day for blisters, cuts, bruises, sores, and redness.  Apply a moisturizing lotion or petroleum jelly to the skin on your feet and to dry, brittle toenails.  Wear shoes that fit properly and have enough cushioning.  If you have foot problems, report any cuts, sores, or bruises to your health care provider immediately.  Schedule a complete foot exam at least once a year (annually) or more often if you have foot problems. This information is not intended to replace advice given to you by your health care provider. Make sure you discuss any questions you have with your health care provider. Document Revised: 05/17/2020 Document Reviewed: 05/17/2020 Elsevier Patient Education  2021 Elsevier Inc.  

## 2020-12-07 NOTE — Telephone Encounter (Signed)
Pt called to request refill of his Tresiba   PHARMACY: [id number] - [place], [place] - [address] AT [facility] [address] Phone:  [phone]  Fax:  [phone]

## 2020-12-10 MED ORDER — TRESIBA FLEXTOUCH 100 UNIT/ML ~~LOC~~ SOPN: 80.0000 [IU] | PEN_INJECTOR | Freq: Every day | SUBCUTANEOUS | 0 refills | Status: DC

## 2020-12-10 NOTE — Telephone Encounter (Signed)
Refill sent. Patient is overdue for a follow up appointment.

## 2020-12-20 ENCOUNTER — Telehealth: Payer: Self-pay

## 2020-12-20 DIAGNOSIS — K219 Gastro-esophageal reflux disease without esophagitis: Secondary | ICD-10-CM

## 2020-12-20 NOTE — Telephone Encounter (Signed)
Patient calling to inform nurse that his medication need a PA, for medication  Dexilant 60mg /#90.   Pt also need a refill on the Allopurinol 100mg /#90 Nitroglycerin 0.4mg  Please advise. CB#[phone]

## 2020-12-21 ENCOUNTER — Other Ambulatory Visit: Payer: Self-pay | Admitting: Family Medicine

## 2020-12-21 ENCOUNTER — Telehealth: Payer: Self-pay

## 2020-12-21 DIAGNOSIS — I2510 Atherosclerotic heart disease of native coronary artery without angina pectoris: Secondary | ICD-10-CM

## 2020-12-21 MED ORDER — NITROGLYCERIN 0.4 MG SL SUBL: 0.4000 mg | SUBLINGUAL_TABLET | SUBLINGUAL | 2 refills | Status: DC | PRN

## 2020-12-21 MED ORDER — DEXLANSOPRAZOLE 30 MG PO CPDR: 30.0000 mg | DELAYED_RELEASE_CAPSULE | Freq: Every day | ORAL | 5 refills | Status: DC

## 2020-12-21 MED ORDER — ALLOPURINOL 100 MG PO TABS: ORAL_TABLET | ORAL | 0 refills | Status: DC

## 2020-12-21 NOTE — Telephone Encounter (Signed)
Patient calling for new prescription of Dexilant per patient Protonix was sent in but he did not pick this up because it does not work. Dexilant was discontinued 10/08/20. Last visit 12/07/20 and 08/21/20. Please advise.

## 2020-12-21 NOTE — Telephone Encounter (Signed)
Pt calling today to request the PA to be done for medication Dexilant.  Pt also wanted a refill on medication Allopurinol and nitroglycerin.  Pt would like a call back to let him know the status of the PA.  I informed pt that the Dexilant was not on his med list but pt said that he has been taking that for a while and that nothing else works for him.  He said that he never did pick up the protonix because it doesn't work for him.

## 2020-12-24 NOTE — Telephone Encounter (Signed)
Pt called again about Dexilant prior auth - he said that 2 RXs were sent in. The correct dosing is Dexilant 60mg  1x day for 90 day supply. He said that pharmacy cannot fill without prior auth. Pt requesting call today to update on status of auth.   Call back [phone].

## 2020-12-24 NOTE — Telephone Encounter (Signed)
Returned patients call no answer LMTCB 

## 2020-12-26 NOTE — Telephone Encounter (Signed)
Patient have picked up Rx, PA not needed.

## 2020-12-27 ENCOUNTER — Emergency Department (HOSPITAL_COMMUNITY)
Admission: EM | Admit: 2020-12-27 | Discharge: 2020-12-27 | Disposition: A | Discharge status: Home / Self Care | Payer: BC Managed Care – PPO | Attending: Emergency Medicine | Admitting: Emergency Medicine

## 2020-12-27 ENCOUNTER — Encounter: Payer: Self-pay | Admitting: Internal Medicine

## 2020-12-27 ENCOUNTER — Emergency Department (HOSPITAL_COMMUNITY): Payer: BC Managed Care – PPO

## 2020-12-27 ENCOUNTER — Encounter (HOSPITAL_COMMUNITY): Payer: Self-pay | Admitting: Emergency Medicine

## 2020-12-27 DIAGNOSIS — Z87891 Personal history of nicotine dependence: Secondary | ICD-10-CM | POA: Diagnosis not present

## 2020-12-27 DIAGNOSIS — Z79899 Other long term (current) drug therapy: Secondary | ICD-10-CM | POA: Insufficient documentation

## 2020-12-27 DIAGNOSIS — R0789 Other chest pain: Secondary | ICD-10-CM | POA: Diagnosis not present

## 2020-12-27 DIAGNOSIS — Z7982 Long term (current) use of aspirin: Secondary | ICD-10-CM | POA: Diagnosis not present

## 2020-12-27 DIAGNOSIS — I2510 Atherosclerotic heart disease of native coronary artery without angina pectoris: Secondary | ICD-10-CM | POA: Insufficient documentation

## 2020-12-27 DIAGNOSIS — I10 Essential (primary) hypertension: Secondary | ICD-10-CM | POA: Insufficient documentation

## 2020-12-27 DIAGNOSIS — E1165 Type 2 diabetes mellitus with hyperglycemia: Secondary | ICD-10-CM | POA: Insufficient documentation

## 2020-12-27 DIAGNOSIS — Z794 Long term (current) use of insulin: Secondary | ICD-10-CM | POA: Diagnosis not present

## 2020-12-27 DIAGNOSIS — Z7984 Long term (current) use of oral hypoglycemic drugs: Secondary | ICD-10-CM | POA: Insufficient documentation

## 2020-12-27 LAB — CBC
HCT: 43.6 % (ref 39.0–52.0)
Hemoglobin: 14 g/dL (ref 13.0–17.0)
MCH: 26.7 pg (ref 26.0–34.0)
MCHC: 32.1 g/dL (ref 30.0–36.0)
MCV: 83 fL (ref 80.0–100.0)
Platelets: 290 10*3/uL (ref 150–400)
RBC: 5.25 MIL/uL (ref 4.22–5.81)
RDW: 13.1 % (ref 11.5–15.5)
WBC: 8 10*3/uL (ref 4.0–10.5)
nRBC: 0 % (ref 0.0–0.2)

## 2020-12-27 LAB — BASIC METABOLIC PANEL
Anion gap: 15 (ref 5–15)
BUN: 16 mg/dL (ref 6–20)
CO2: 20 mmol/L — ABNORMAL LOW (ref 22–32)
Calcium: 10.1 mg/dL (ref 8.9–10.3)
Chloride: 98 mmol/L (ref 98–111)
Creatinine, Ser: 0.74 mg/dL (ref 0.61–1.24)
GFR, Estimated: >60 mL/min (ref >60)
Glucose, Bld: 332 mg/dL — ABNORMAL HIGH (ref 70–99)
Potassium: 4 mmol/L (ref 3.5–5.1)
Sodium: 133 mmol/L — ABNORMAL LOW (ref 135–145)

## 2020-12-27 LAB — TROPONIN I (HIGH SENSITIVITY)
Troponin I (High Sensitivity): 6 ng/L (ref <18)
Troponin I (High Sensitivity): 7 ng/L (ref <18)

## 2020-12-27 LAB — CBG MONITORING, ED: Glucose-Capillary: 226 mg/dL — ABNORMAL HIGH (ref 70–99)

## 2020-12-27 MED ORDER — ALUM & MAG HYDROXIDE-SIMETH 200-200-20 MG/5ML PO SUSP: 30.0000 mL | Freq: Once | ORAL | Status: DC

## 2020-12-27 MED ORDER — ALUM & MAG HYDROXIDE-SIMETH 200-200-20 MG/5ML PO SUSP
30.0000 mL | Freq: Once | ORAL | Status: AC
  Administered 2020-12-27: 30 mL via ORAL
  Filled 2020-12-27 (×2): qty 30

## 2020-12-27 MED ORDER — INSULIN ASPART 100 UNIT/ML ~~LOC~~ SOLN
8.0000 [IU] | Freq: Once | SUBCUTANEOUS | Status: AC
  Administered 2020-12-27: 8 [IU] via SUBCUTANEOUS

## 2020-12-27 NOTE — ED Triage Notes (Signed)
Patient BIB [facility] for evaluation of chest pain radiating to back and left arm that started last night. Patient took 324mg  aspirin PTA. Patient alert, oriented, and in no apparent distress at this time.

## 2020-12-27 NOTE — ED Provider Notes (Signed)
[facility] EMERGENCY DEPARTMENT Provider Note   CSN: [id number] Arrival date & time: 12/27/20  1111     History Chief Complaint  Patient presents with  . Chest Pain    Aaron Franco is a 50 y.o. male.  HPI  HPI: A 50 year old patient with a history of treated diabetes, hypertension and hypercholesterolemia presents for evaluation of chest pain. Initial onset of pain was more than 6 hours ago. The patient's chest pain is not worse with exertion. The patient's chest pain is not middle- or left-sided, is not well-localized, is not described as heaviness/pressure/tightness, is not sharp and does not radiate to the arms/jaw/neck. The patient does not complain of nausea and denies diaphoresis. The patient has no history of stroke, has no history of peripheral artery disease, has not smoked in the past 90 days, has no relevant family history of coronary artery disease (first degree relative at less than age 62) and does not have an elevated BMI (>=30).  Patient states the symptoms started last evening after eating.  He has had a burning discomfort in his chest that continued throughout the night.  Patient does have history of heart disease with MI and prior stenting.  Patient states the symptoms are very different from the symptoms he experienced with his coronary artery disease.  He understandably wanted to be careful though so he came into the ED for evaluation. Past Medical History:  Diagnosis Date  . CAD (coronary artery disease)    a. 05/2013 NSTEMI/PCI: mCFX 100%-> 3.0 mm  x 20 mm Promus premier DES, EF 65%;  01/2014 Cath: LM nl, LAD 20-30d, LCX patent distal stent, RCA 30-33m, EF 55-65%.  . Diabetes mellitus   . Essential hypertension   . GERD (gastroesophageal reflux disease)   . Gout   . History of echocardiogram    a. 03/2011 Echo: EF 65%, mild LVH, no rwma, mildly dil LA.  Marland Kitchen Hyperlipidemia   . MI, old   . Obesity     Patient Active Problem List   Diagnosis  Date Noted  . Type 2 diabetes mellitus with hyperglycemia, with long-term current use of insulin (HCC) 12/07/2020  . Allergic rhinitis due to pollen 06/29/2020  . Elevated triglycerides with high cholesterol 03/22/2020  . Neuropathy 03/13/2020  . Erectile dysfunction due to arterial insufficiency 03/13/2020  . History of gout 11/28/2019  . Educated about COVID-19 virus infection 03/29/2019  . Coronary artery disease involving native heart without angina pectoris 03/29/2019  . Chest pain with moderate risk of acute coronary syndrome 09/17/2015  . Type 2 diabetes mellitus with diabetic neuropathy, unspecified (HCC) 04/23/2015  . Thrush 04/23/2015  . CAD S/P percutaneous coronary angioplasty -  01/24/2014  . Murmur 02/21/2011  . SINUSITIS - ACUTE-NOS 01/09/2011  . EDEMA- LOCALIZED 12/24/2010  . SHOULDER PAIN 10/16/2010  . VITAMIN B12 DEFICIENCY 07/19/2010  . Morbid obesity (HCC) 07/19/2010  . DECREASED LIBIDO 07/19/2010  . STRAIN, CHEST WALL 07/19/2010  . OTHER VITAMIN B12 DEFICIENCY ANEMIA 10/31/2009  . PAIN IN JOINT, HAND 10/16/2009  . TRIGGER FINGER 10/16/2009  . BACK STRAIN, LUMBAR 10/16/2009  . RHINITIS 02/06/2009  . Dyslipidemia 03/01/2007  . GOUT 03/01/2007  . Essential hypertension 03/01/2007  . Gastroesophageal reflux disease 03/01/2007    Past Surgical History:  Procedure Laterality Date  . BICEPS TENDON REPAIR  Nov 13 2010   right  . HAND SURGERY  1990   left  . LEFT HEART CATH N/A 05/12/2013   Procedure: LEFT HEART CATH;  Surgeon: Lesleigh Noe, MD;  Location: [facility] CATH LAB;  Service: Cardiovascular;  Laterality: N/A;  . LEFT HEART CATHETERIZATION WITH CORONARY ANGIOGRAM N/A 01/24/2014   Procedure: LEFT HEART CATHETERIZATION WITH CORONARY ANGIOGRAM;  Surgeon: Peter M Swaziland, MD;  Location: [facility] CATH LAB;  Service: Cardiovascular;  Laterality: N/A;  . PERCUTANEOUS CORONARY STENT INTERVENTION (PCI-S)  05/2013   mCx 3.45mm x 20 mm Promus P DES  . PERCUTANEOUS CORONARY  STENT INTERVENTION (PCI-S)  05/12/2013   Procedure: PERCUTANEOUS CORONARY STENT INTERVENTION (PCI-S);  Surgeon: Lesleigh Noe, MD;  Location: [facility] CATH LAB;  Service: Cardiovascular;;       Family History  Problem Relation Age of Onset  . Heart attack Father        deceased  . Diabetes Father   . Congestive Heart Failure Father   . Stroke Mother   . Pancreatic cancer Maternal Aunt   . Colon cancer Neg Hx     Social History   Tobacco Use  . Smoking status: Former Smoker    Types: Cigarettes    Quit date: 11/10/1998    Years since quitting: 22.1  . Smokeless tobacco: Never Used  . Tobacco comment: pt only smoked 1-2 cigs a day, not everyday on occs x 2 years  Vaping Use  . Vaping Use: Never used  Substance Use Topics  . Alcohol use: Yes    Comment: rare  . Drug use: No    Home Medications Prior to Admission medications   Medication Sig Start Date End Date Taking? Authorizing Provider  acetaminophen (TYLENOL) 325 MG tablet Take 2 tablets (650 mg total) by mouth every 4 (four) hours as needed for headache or mild pain. 05/06/17   Abelino Derrick, PA-C  allopurinol (ZYLOPRIM) 100 MG tablet TAKE 1 TABLET(100 MG) BY MOUTH DAILY 12/21/20   Mliss Sax, MD  amLODipine (NORVASC) 10 MG tablet Take 10 mg by mouth daily. 12/04/19   [provider]  aspirin 81 MG chewable tablet Chew 1 tablet (81 mg total) by mouth daily. 05/14/13   Tereso Newcomer T, PA-C  benzonatate (TESSALON) 200 MG capsule Take 200 mg by mouth 2 (two) times daily as needed for cough. 05/25/19   [provider]  cetirizine (ZYRTEC) 10 MG tablet Take by mouth. 05/27/20   [provider]  dapagliflozin propanediol (FARXIGA) 10 MG TABS tablet Take 10 mg by mouth daily before breakfast. 03/22/20   Shamleffer, Konrad Dolores, MD  Dexlansoprazole (DEXILANT) 30 MG capsule Take 1 capsule (30 mg total) by mouth daily. 12/21/20   Loyola Mast, MD  dexlansoprazole (DEXILANT) 60 MG capsule TAKE 1  CAPSULE(60 MG) BY MOUTH DAILY 12/21/20   Worthy Rancher B, FNP  fluticasone [facility]) 50 MCG/ACT nasal spray Place 1 spray into both nostrils daily as needed for allergies or rhinitis. 06/29/20 10/13/24  Mliss Sax, MD  gabapentin (NEURONTIN) 300 MG capsule Take one at night for one week and then increase to one twice daily. 03/13/20   Mliss Sax, MD  insulin degludec (TRESIBA FLEXTOUCH) 100 UNIT/ML FlexTouch Pen Inject 80 Units into the skin daily. NEED APPOINTMENT FOR ANY MORE REFILLS 12/10/20   Shamleffer, Konrad Dolores, MD  nebivolol (BYSTOLIC) 5 MG tablet Take 1 tablet (5 mg total) by mouth daily. 02/09/20   Mliss Sax, MD  nitroGLYCERIN (NITROSTAT) 0.4 MG SL tablet Place 1 tablet (0.4 mg total) under the tongue every 5 (five) minutes x 3 doses as needed for chest pain. 12/21/20  Mliss Sax, MD  Omega-3 Fatty Acids (FISH OIL) 1000 MG CAPS Take 1,000 mg by mouth 2 (two) times a day.    [provider]  rosuvastatin (CRESTOR) 40 MG tablet Take 40 mg by mouth daily. 03/29/19   [provider]  UNABLE TO FIND Med Name: livago meter    [provider]  vitamin B-12 (CYANOCOBALAMIN) 500 MCG tablet Take 1 tablet (500 mcg total) by mouth daily. Patient not taking: Reported on 12/07/2020 03/20/20   Mliss Sax, MD    Allergies    Influenza vaccines, Fenofibrate, Metformin, and Prednisone  Review of Systems   Review of Systems  All other systems reviewed and are negative.   Physical Exam Updated Vital Signs BP 133/81 (BP Location: Right Arm)   Pulse 78   Temp 98.1 F (36.7 C) (Oral)   Resp 20   SpO2 100%   Physical Exam Vitals and nursing note reviewed.  Constitutional:      General: He is not in acute distress.    Appearance: He is well-developed and well-nourished.  HENT:     Head: Normocephalic and atraumatic.     Right Ear: External ear normal.     Left Ear: External ear normal.  Eyes:     General: No  scleral icterus.       Right eye: No discharge.        Left eye: No discharge.     Conjunctiva/sclera: Conjunctivae normal.  Neck:     Trachea: No tracheal deviation.  Cardiovascular:     Rate and Rhythm: Normal rate and regular rhythm.     Pulses: Intact distal pulses.  Pulmonary:     Effort: Pulmonary effort is normal. No respiratory distress.     Breath sounds: Normal breath sounds. No stridor. No wheezing or rales.  Abdominal:     General: Bowel sounds are normal. There is no distension.     Palpations: Abdomen is soft.     Tenderness: There is no abdominal tenderness. There is no guarding or rebound.  Musculoskeletal:        General: No tenderness or edema.     Cervical back: Neck supple.  Skin:    General: Skin is warm and dry.     Findings: No rash.  Neurological:     Mental Status: He is alert.     Cranial Nerves: No cranial nerve deficit (no facial droop, extraocular movements intact, no slurred speech).     Sensory: No sensory deficit.     Motor: No abnormal muscle tone or seizure activity.     Coordination: Coordination normal.     Deep Tendon Reflexes: Strength normal.  Psychiatric:        Mood and Affect: Mood and affect normal.     ED Results / Procedures / Treatments   Labs (all labs ordered are listed, but only abnormal results are displayed) Labs Reviewed  BASIC METABOLIC PANEL - Abnormal; Notable for the following components:      Result Value   Sodium 133 (*)    CO2 20 (*)    Glucose, Bld 332 (*)    All other components within normal limits  CBG MONITORING, ED - Abnormal; Notable for the following components:   Glucose-Capillary 226 (*)    All other components within normal limits  CBC  TROPONIN I (HIGH SENSITIVITY)  TROPONIN I (HIGH SENSITIVITY)    EKG EKG Interpretation  Date/Time:  Thursday December 27 2020 11:19:05 EST Ventricular Rate:  84 PR Interval:  158 QRS Duration: 90 QT Interval:  382 QTC Calculation: 451 R Axis:   -6 Text  Interpretation: Normal sinus rhythm Normal ECG No significant change since last tracing Confirmed by Linwood DibblesKnapp, Ramzy Cappelletti [phone]([id number]) on 12/27/2020 1:51:44 PM   Radiology DG Chest 2 View  Result Date: 12/27/2020 CLINICAL DATA:  Chest pain EXAM: CHEST - 2 VIEW COMPARISON:  12/26/2019 FINDINGS: The heart size and mediastinal contours are within normal limits. Both lungs are clear. The visualized skeletal structures are unremarkable. IMPRESSION: Negative. Electronically Signed   By: Charlett NoseKevin  Dover M.D.   On: 12/27/2020 11:57    Procedures Procedures   Medications Ordered in ED Medications  insulin aspart (novoLOG) injection 8 Units (8 Units Subcutaneous Given 12/27/20 1515)  alum & mag hydroxide-simeth (MAALOX/MYLANTA) 200-200-20 MG/5ML suspension 30 mL (30 mLs Oral Given 12/27/20 1513)    ED Course  I have reviewed the triage vital signs and the nursing notes.  Pertinent labs & imaging results that were available during my care of the patient were reviewed by me and considered in my medical decision making (see chart for details).  Clinical Course as of 12/28/20 0706  Thu Dec 27, 2020  1406 Initial laboratory tests notable for hyperglycemia.  Otherwise first troponin is normal. [JK]    Clinical Course User Index [JK] Linwood DibblesKnapp, Reynaldo Rossman, MD   MDM Rules/Calculators/A&P HEAR Score: 4                        Pt presents with chest pain. Hx of CAD but sx today are different than CAD sx.   Pt also has history of GERD.  Moderate risk heart score.  ED work-up reassuring.  Hyperglycemia noted but no signs of DKA.  Patient without pneumonia.  No pneumothorax.  EKG reassuring.  Serial troponins normal.  Doubt ACS.  Patient noted resolution after GI cocktail.  Suspect symptoms are related to his acid reflux. Final Clinical Impression(s) / ED Diagnoses Final diagnoses:  Atypical chest pain    Rx / DC Orders ED Discharge Orders    None       Linwood DibblesKnapp, Leniyah Martell, MD 12/28/20 [phone]

## 2020-12-27 NOTE — ED Provider Notes (Signed)
Pt signed out by Dr. Lynelle Doctor pending 2nd trop.  2nd trop is nl.  Pt is stable for d/c.  Return if worse.   Jacalyn Lefevre, MD 12/27/20 1715

## 2020-12-31 ENCOUNTER — Telehealth: Payer: Self-pay

## 2020-12-31 NOTE — Telephone Encounter (Signed)
Patient aware that we currently do not have possion of paper work this was placed in a stack of files to be individually scanned to patient chart there really isn't a timeframe of how long this will take time be scanned in.

## 2020-12-31 NOTE — Telephone Encounter (Signed)
Patients wife, Foy Guadalajara, called for patient. They need a copy of the [employer] form that was filled out in our office.  Does she need to call medical records?  Please advise  Thanks

## 2021-01-03 ENCOUNTER — Ambulatory Visit: Payer: BC Managed Care – PPO | Admitting: Family Medicine

## 2021-01-04 ENCOUNTER — Other Ambulatory Visit: Payer: Self-pay

## 2021-01-04 ENCOUNTER — Ambulatory Visit (INDEPENDENT_AMBULATORY_CARE_PROVIDER_SITE_OTHER): Payer: BC Managed Care – PPO | Admitting: Internal Medicine

## 2021-01-04 ENCOUNTER — Encounter: Payer: Self-pay | Admitting: Internal Medicine

## 2021-01-04 VITALS — BP 172/96 | HR 72 | Ht 74.0 in | Wt 273.5 lb

## 2021-01-04 DIAGNOSIS — E782 Mixed hyperlipidemia: Secondary | ICD-10-CM

## 2021-01-04 DIAGNOSIS — E1165 Type 2 diabetes mellitus with hyperglycemia: Secondary | ICD-10-CM

## 2021-01-04 DIAGNOSIS — Z794 Long term (current) use of insulin: Secondary | ICD-10-CM

## 2021-01-04 LAB — POCT GLUCOSE (DEVICE FOR HOME USE): POC Glucose: 540 mg/dl — AB (ref 70–99)

## 2021-01-04 MED ORDER — INSULIN PEN NEEDLE 31G X 8 MM MISC: 1.0000 | Freq: Four times a day (QID) | 2 refills | Status: AC

## 2021-01-04 MED ORDER — NOVOLOG FLEXPEN 100 UNIT/ML ~~LOC~~ SOPN: 10.0000 [IU] | PEN_INJECTOR | Freq: Three times a day (TID) | SUBCUTANEOUS | 2 refills | Status: DC

## 2021-01-04 MED ORDER — DAPAGLIFLOZIN PROPANEDIOL 5 MG PO TABS: 5.0000 mg | ORAL_TABLET | Freq: Every day | ORAL | 2 refills | Status: DC

## 2021-01-04 NOTE — Patient Instructions (Addendum)
-   Farxiga 5 mg daily with Breakfast  - Tresiba 20 units TWICE A DAY  - START Novolog 10 units with each meal      HOW TO TREAT LOW BLOOD SUGARS (Blood sugar LESS THAN 70 MG/DL)  Please follow the RULE OF 15 for the treatment of hypoglycemia treatment (when your (blood sugars are less than 70 mg/dL)    STEP 1: Take 15 grams of carbohydrates when your blood sugar is low, which includes:   3-4 GLUCOSE TABS  OR  3-4 OZ OF JUICE OR REGULAR SODA OR  ONE TUBE OF GLUCOSE GEL     STEP 2: RECHECK blood sugar in 15 MINUTES STEP 3: If your blood sugar is still low at the 15 minute recheck --> then, go back to STEP 1 and treat AGAIN with another 15 grams of carbohydrates.

## 2021-01-04 NOTE — Progress Notes (Signed)
Name: Aaron Franco  Age/ Sex: 50 y.o., male   MRN/ DOB: [id number], 1971/05/26     PCP: Mliss SaxKremer, William Alfred, MD   Reason for Endocrinology Evaluation: Type 2 Diabetes Mellitus  Initial Endocrine Consultative Visit: 03/22/2020    PATIENT IDENTIFIER: [name] is a 50 y.o. male with a past medical history of HTN, CAD ( S/P PCI) and T2DM. The patient has followed with Endocrinology clinic since 03/22/2020 for consultative assistance with management of his diabetes.  DIABETIC HISTORY:  [name] was diagnosed with DM in 2010, metformin caused chest discomfort , has been on Glimepiride, Januvia , Trulicity - unable to tolerate higher doses - nausea, blurry vision. His hemoglobin A1c has ranged from 6.6% in 2011, peaking at 13.1% in 2016.   On his initial visit to our clinic his A1c was 12.4 % We increased his [place] and switched Levemir to [place]iba   SUBJECTIVE:   During the last visit (03/22/2020): A1c 12.4% We increased his [place]iga and switched Levemir to [place]iba    Today (01/04/2021): [name] is here for a follow up on diabetes. He has NOT been to our clinic in 9 months.  He checks his blood sugars 1 times daily. The patient has not had hypoglycemic episodes since the last clinic visit. Otherwise, the patient has required any recent emergency interventions for hyperglycemia     He drinks sugar- sweetened beverages Admits to non adherence with medication     Eats 2-3 meals a day, snacks once a day.    Legs feel swollen and tight with tingling and numbness over the feet     HOME DIABETES REGIMEN:  Farxiga 10 mg daily with Breakfast - takes half a tablet due to chest tightness  Tresiba 80 units daily - takes 30 because it gives him shallow breathing      Statin: yes ACE-I/ARB: no    METER DOWNLOAD SUMMARY: Did not bring     DIABETIC COMPLICATIONS: Microvascular complications:   Microvascular complications:   Neuropathy  Denies: CKD,  retinopathy  Last eye exam: Completed 2020  Macrovascular complications:   CAD (S/P PCI)  Denies: PVD, CVA  HISTORY:  Past Medical History:  Past Medical History:  Diagnosis Date  . CAD (coronary artery disease)    a. 05/2013 NSTEMI/PCI: mCFX 100%-> 3.0 mm  x 20 mm Promus premier DES, EF 65%;  01/2014 Cath: LM nl, LAD 20-30d, LCX patent distal stent, RCA 30-5868m, EF 55-65%.  . Diabetes mellitus   . Essential hypertension   . GERD (gastroesophageal reflux disease)   . Gout   . History of echocardiogram    a. 03/2011 Echo: EF 65%, mild LVH, no rwma, mildly dil LA.  Marland Kitchen. Hyperlipidemia   . MI, old   . Obesity    Past Surgical History:  Past Surgical History:  Procedure Laterality Date  . BICEPS TENDON REPAIR  Nov 13 2010   right  . HAND SURGERY  1990   left  . LEFT HEART CATH N/A 05/12/2013   Procedure: LEFT HEART CATH;  Surgeon: Lesleigh NoeHenry W Smith III, MD;  [facility];  Service: Cardiovascular;  Laterality: N/A;  . LEFT HEART CATHETERIZATION WITH CORONARY ANGIOGRAM N/A 01/24/2014   Procedure: LEFT HEART CATHETERIZATION WITH CORONARY ANGIOGRAM;  Surgeon: Peter M SwazilandJordan, MD;  [facility];  Service: Cardiovascular;  Laterality: N/A;  . PERCUTANEOUS CORONARY STENT INTERVENTION (PCI-S)  05/2013   mCx 3.370mm x 20 mm Promus P DES  . PERCUTANEOUS CORONARY STENT  INTERVENTION (PCI-S)  05/12/2013   Procedure: PERCUTANEOUS CORONARY STENT INTERVENTION (PCI-S);  Surgeon: Lesleigh Noe, MD;  Location: [facility] CATH LAB;  Service: Cardiovascular;;    Social History:  reports that he quit smoking about 22 years ago. His smoking use included cigarettes. He has never used smokeless tobacco. He reports current alcohol use. He reports that he does not use drugs. Family History:  Family History  Problem Relation Age of Onset  . Heart attack Father        deceased  . Diabetes Father   . Congestive Heart Failure Father   . Stroke Mother   . Pancreatic cancer Maternal Aunt   . Colon cancer  Neg Hx      HOME MEDICATIONS: Allergies as of 01/04/2021      Reactions   Influenza Vaccines Anaphylaxis   Reaction June 2017   Fenofibrate Other (See Comments)   Hot flashes   Metformin Other (See Comments)   Chest discomfort   Prednisone Other (See Comments)   Mood swings      Medication List       Accurate as of January 04, 2021  9:17 AM. If you have any questions, ask your nurse or doctor.        acetaminophen 325 MG tablet Commonly known as: TYLENOL Take 2 tablets (650 mg total) by mouth every 4 (four) hours as needed for headache or mild pain.   allopurinol 100 MG tablet Commonly known as: ZYLOPRIM TAKE 1 TABLET(100 MG) BY MOUTH DAILY   amLODipine 10 MG tablet Commonly known as: NORVASC Take 10 mg by mouth daily.   aspirin 81 MG chewable tablet Chew 1 tablet (81 mg total) by mouth daily.   benzonatate 200 MG capsule Commonly known as: TESSALON Take 200 mg by mouth 2 (two) times daily as needed for cough.   cetirizine 10 MG tablet Commonly known as: ZYRTEC Take by mouth.   dapagliflozin propanediol 5 MG Tabs tablet Commonly known as: Farxiga Take 1 tablet (5 mg total) by mouth daily before breakfast. What changed:   medication strength  how much to take Changed by: Scarlette Shorts, MD   Dexlansoprazole 30 MG capsule Commonly known as: Dexilant Take 1 capsule (30 mg total) by mouth daily.   dexlansoprazole 60 MG capsule Commonly known as: DEXILANT TAKE 1 CAPSULE(60 MG) BY MOUTH DAILY   Fish Oil 1000 MG Caps Take 1,000 mg by mouth 2 (two) times a day.   fluticasone 50 MCG/ACT nasal spray Commonly known as: FLONASE Place 1 spray into both nostrils daily as needed for allergies or rhinitis.   gabapentin 300 MG capsule Commonly known as: NEURONTIN Take one at night for one week and then increase to one twice daily.   Insulin Pen Needle 31G X 8 MM Misc 1 Device by Does not apply route in the morning, at noon, in the evening, and at  bedtime. Started by: Scarlette Shorts, MD   nebivolol 5 MG tablet Commonly known as: BYSTOLIC Take 1 tablet (5 mg total) by mouth daily.   nitroGLYCERIN 0.4 MG SL tablet Commonly known as: NITROSTAT Place 1 tablet (0.4 mg total) under the tongue every 5 (five) minutes x 3 doses as needed for chest pain.   NovoLOG FlexPen 100 UNIT/ML FlexPen Generic drug: insulin aspart Inject 10 Units into the skin 3 (three) times daily with meals. Started by: Scarlette Shorts, MD   rosuvastatin 40 MG tablet Commonly known as: CRESTOR Take 40 mg by mouth  daily.   Evaristo Bury FlexTouch 100 UNIT/ML FlexTouch Pen Generic drug: insulin degludec Inject 80 Units into the skin daily. NEED APPOINTMENT FOR ANY MORE REFILLS What changed: how much to take   UNABLE TO FIND Med Name: livago meter   vitamin B-12 500 MCG tablet Commonly known as: CYANOCOBALAMIN Take 1 tablet (500 mcg total) by mouth daily.        OBJECTIVE:   Vital Signs: BP (!) 172/96   Pulse 72   Ht 6\' 2"  (1.88 m)   Wt 273 lb 8 oz (124.1 kg)   SpO2 98%   BMI 35.12 kg/m   Wt Readings from Last 3 Encounters:  01/04/21 273 lb 8 oz (124.1 kg)  12/07/20 272 lb 9.6 oz (123.7 kg)  11/30/20 275 lb (124.7 kg)     Exam: General: Pt appears well and is in NAD  Lungs: Clear with good BS bilat with no rales, rhonchi, or wheezes  Heart: RRR with normal S1 and S2 and no gallops; no murmurs; no rub  Abdomen: Normoactive bowel sounds, soft, nontender, without masses or organomegaly palpable  Extremities: No pretibial edema.   Neuro: MS is good with appropriate affect, pt is alert and Ox3           DATA REVIEWED:  Lab Results  Component Value Date   HGBA1C 14.1 (H) 11/30/2020   HGBA1C 12.4 (H) 03/13/2020   HGBA1C 11.8 (H) 09/18/2015   Lab Results  Component Value Date   MICROALBUR 20.0 (H) 03/15/2020   LDLCALC Comment (A) 11/30/2020   CREATININE 0.74 12/27/2020   Lab Results  Component Value Date   MICRALBCREAT  19.3 03/15/2020     Lab Results  Component Value Date   CHOL 384 (H) 11/30/2020   HDL 32 (L) 11/30/2020   LDLCALC Comment (A) 11/30/2020   LDLDIRECT 110.0 03/13/2020   TRIG 1,040 (HH) 11/30/2020   CHOLHDL 12.0 (H) 11/30/2020         ASSESSMENT / PLAN / RECOMMENDATIONS:   1) Type 2 Diabetes Mellitus, poorly controlled, With Neuropathic and Macrovascular  complications - Most recent A1c of 14.1 %. Goal A1c < 7.0 %.    - Poorly controlled diabetes due to medication non adherence and dietary indiscretions  - He takes 50 % less of the prescribed 12/02/2020 stating shortness of breath, pt tells me he had his lungs and heart checked in the past and everything was normal and does not believe this is the cause . - We discussed pump options, at first he declined but later stated he would like to  think about it and will get back with me.  - We again discussed risk of microvascular complication with uncontrolled DM and encouraged him to take medications as prescribed  - He would like to take [place] on BID dosing as he thinks taking all at one time is " too much for his system" - We discussed adding prandial insulin, as he seems to be " sensitive " to to many medications   - No a candidate for GLP-1 agonists due to high Tg   MEDICATIONS: - Farxiga 5 mg daily with Breakfast  - Tresiba 20 units TWICE A DAY  - START Novolog 10 units with each meal     EDUCATION / INSTRUCTIONS:  BG monitoring instructions: Patient is instructed to check his blood sugars 2 times a day.  Call [facility] if: BG persistently < 70   I reviewed the Rule of 15 for the treatment  of hypoglycemia in detail with the patient. Literature supplied.   2) Diabetic complications:   Eye: Does not have known diabetic retinopathy.   Neuro/ Feet: Does  have known diabetic peripheral neuropathy.  Renal: Patient does not have known baseline CKD. He is not on an ACEI/ARB at present.  3)  Combined Hyperlipidemia:   - Patient is on statin but LDL is above goal, as well as high Tg .Again with non-compliance  - We had discussed in the past increased  risk of pancreatitis with elevated Tg .    Unfortunately , my role is limited when he continues to drink sugar sweetened beverages and other dietary indiscretions and does not take his meds. All I can do is advise him and recommend what may work for him if taken on regular basis.   F/U in 4 months    Signed electronically by: Lyndle Herrlich, MD  [facility] Endocr[facility]  [facility] [address] [place] [place], [place] [address] Phone: [phone] FAX: [phone]   CC: Mliss Sax, MD [address] [place] [address] Phone: [phone]  Fax: [phone]  Return to Endocrinology clinic as below: Future Appointments  Date Time Provider Department Center  01/07/2021  1:00 PM Loyola Mast, MD [id number]

## 2021-01-07 ENCOUNTER — Encounter: Payer: Self-pay | Admitting: Family Medicine

## 2021-01-07 ENCOUNTER — Ambulatory Visit (INDEPENDENT_AMBULATORY_CARE_PROVIDER_SITE_OTHER): Payer: BC Managed Care – PPO | Admitting: Family Medicine

## 2021-01-07 ENCOUNTER — Other Ambulatory Visit: Payer: Self-pay

## 2021-01-07 VITALS — BP 150/77 | HR 68 | Temp 97.8°F | Ht 74.0 in | Wt 275.2 lb

## 2021-01-07 DIAGNOSIS — R600 Localized edema: Secondary | ICD-10-CM | POA: Diagnosis not present

## 2021-01-07 DIAGNOSIS — K219 Gastro-esophageal reflux disease without esophagitis: Secondary | ICD-10-CM | POA: Diagnosis not present

## 2021-01-07 DIAGNOSIS — E1140 Type 2 diabetes mellitus with diabetic neuropathy, unspecified: Secondary | ICD-10-CM | POA: Diagnosis not present

## 2021-01-07 DIAGNOSIS — B351 Tinea unguium: Secondary | ICD-10-CM | POA: Diagnosis not present

## 2021-01-07 DIAGNOSIS — Z794 Long term (current) use of insulin: Secondary | ICD-10-CM

## 2021-01-07 MED ORDER — TRESIBA FLEXTOUCH 100 UNIT/ML ~~LOC~~ SOPN: 20.0000 [IU] | PEN_INJECTOR | Freq: Two times a day (BID) | SUBCUTANEOUS | 0 refills | Status: DC

## 2021-01-07 NOTE — Progress Notes (Signed)
[facility] [address] [place] [address] Dept: [phone] Dept Fax: [phone]  Acute Office Visit  Subjective:    Patient ID: Aaron Franco, male    DOB: August 20, 1971, 50 y.o..   MRN: [id number]  Chief Complaint  Patient presents with  . Hospitalization Follow-up    Pt is here for follow up on swelling and aching win his legs, pt states this has been ongoing since [place] describes it feels like compression sicks more so in the right leg.     History of Present Illness:  Patient is in today for reassessment after a recent ED visit. He notes he had presented with chest pain. He notes this was determined to not be related to his heart. He feels it was related to GERD. He is back to taking his Dexilant more consistently. He is having not having anymore symptoms of chest discomfort at this point.  Aaron Franco was seen recently by his endocrinologist, Dr. Konrad Dolores, related to his diabetes for uncontrolled diabetes. He continues on Farxiga 5 mg daily. His insulin degludec Evaristo Bury) was changed to 20 units bid and insulin aspart (Novolog) was added at 10 units qac.  Aaron Franco notes that he does have issue with swelling in his lower legs. Along with this, he has noted a sense of numbness, almost like he is wearing pressure socks. He admits to having dry skin on the feet and has some concern for needing to see a podiatrist.  Past Medical History: Patient Active Problem List   Diagnosis Date Noted  . Type 2 diabetes mellitus with hyperglycemia, with long-term current use of insulin (HCC) 12/07/2020  . Allergic rhinitis due to pollen 06/29/2020  . Neuropathy 03/13/2020  . Erectile dysfunction due to arterial insufficiency 03/13/2020  . History of gout 11/28/2019  . Coronary artery disease involving native heart without angina pectoris 03/29/2019  . Microalbuminuria due to type 2 diabetes mellitus (HCC) 12/14/2017   . Onychomycosis 12/01/2017  . Morbid obesity with BMI of 40.0-44.9, adult (HCC) 02/18/2016  . Chest pain with moderate risk of acute coronary syndrome 09/17/2015  . Type 2 diabetes mellitus with diabetic neuropathy, unspecified (HCC) 04/23/2015  . Thrush 04/23/2015  . Type II diabetes mellitus with renal manifestations (HCC) 02/16/2014  . 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  . Hammer toe, acquired 02/16/2014  . CAD S/P percutaneous coronary angioplasty -  01/24/2014  . Murmur 02/21/2011  . Pedal edema 12/24/2010  . Vitamin B12 deficiency 07/19/2010  . Anemia due to vitamin B12 deficiency 10/31/2009  . Trigger finger 10/16/2009  . Dyslipidemia 03/01/2007  . Gout 03/01/2007  . Essential hypertension 03/01/2007  . Gastroesophageal reflux disease 03/01/2007   Past Surgical History:  Procedure Laterality Date  . BICEPS TENDON REPAIR  Nov 13 2010   right  . HAND SURGERY  1990   left  . LEFT HEART CATH N/A 05/12/2013   Procedure: LEFT HEART CATH;  Surgeon: Lesleigh Noe, MD;  Location: [facility] CATH LAB;  Service: Cardiovascular;  Laterality: N/A;  . LEFT HEART CATHETERIZATION WITH CORONARY ANGIOGRAM N/A 01/24/2014   Procedure: LEFT HEART CATHETERIZATION WITH CORONARY ANGIOGRAM;  Surgeon: Peter M Swaziland, MD;  Location: [facility] CATH LAB;  Service: Cardiovascular;  Laterality: N/A;  . PERCUTANEOUS CORONARY STENT INTERVENTION (PCI-S)  05/2013   mCx 3.10mm x 20 mm Promus P DES  . PERCUTANEOUS CORONARY STENT INTERVENTION (PCI-S)  05/12/2013   Procedure: PERCUTANEOUS CORONARY STENT INTERVENTION (PCI-S);  Surgeon: Lyn Records III,  MD;  Location: [facility] CATH LAB;  Service: Cardiovascular;;   Family History  Problem Relation Age of Onset  . Heart attack Father        deceased  . Diabetes Father   . Congestive Heart Failure Father   . Stroke Mother   . Pancreatic cancer Maternal Aunt   . Colon cancer Neg Hx    Outpatient Medications Prior to Visit  Medication Sig Dispense Refill  . acetaminophen  (TYLENOL) 325 MG tablet Take 2 tablets (650 mg total) by mouth every 4 (four) hours as needed for headache or mild pain.    Marland Kitchen allopurinol (ZYLOPRIM) 100 MG tablet TAKE 1 TABLET(100 MG) BY MOUTH DAILY 90 tablet 0  . amLODipine (NORVASC) 10 MG tablet Take 10 mg by mouth daily.    Marland Kitchen aspirin 81 MG chewable tablet Chew 1 tablet (81 mg total) by mouth daily.    . benzonatate (TESSALON) 200 MG capsule Take 200 mg by mouth 2 (two) times daily as needed for cough.    . cetirizine (ZYRTEC) 10 MG tablet Take by mouth.    . dapagliflozin propanediol (FARXIGA) 5 MG TABS tablet Take 1 tablet (5 mg total) by mouth daily before breakfast. 90 tablet 2  . dexlansoprazole (DEXILANT) 60 MG capsule TAKE 1 CAPSULE(60 MG) BY MOUTH DAILY 90 capsule 2  . fluticasone (FLONASE) 50 MCG/ACT nasal spray Place 1 spray into both nostrils daily as needed for allergies or rhinitis. 18.2 mL 6  . gabapentin (NEURONTIN) 300 MG capsule Take one at night for one week and then increase to one twice daily. 60 capsule 3  . insulin aspart (NOVOLOG FLEXPEN) 100 UNIT/ML FlexPen Inject 10 Units into the skin 3 (three) times daily with meals. 30 mL 2  . Insulin Pen Needle 31G X 8 MM MISC 1 Device by Does not apply route in the morning, at noon, in the evening, and at bedtime. 400 each 2  . nebivolol (BYSTOLIC) 5 MG tablet Take 1 tablet (5 mg total) by mouth daily. 90 tablet 1  . nitroGLYCERIN (NITROSTAT) 0.4 MG SL tablet Place 1 tablet (0.4 mg total) under the tongue every 5 (five) minutes x 3 doses as needed for chest pain. 25 tablet 2  . Omega-3 Fatty Acids (FISH OIL) 1000 MG CAPS Take 1,000 mg by mouth 2 (two) times a day.    . rosuvastatin (CRESTOR) 40 MG tablet Take 40 mg by mouth daily.    . vitamin B-12 (CYANOCOBALAMIN) 500 MCG tablet Take 1 tablet (500 mcg total) by mouth daily. 90 tablet 1  . insulin degludec (TRESIBA FLEXTOUCH) 100 UNIT/ML FlexTouch Pen Inject 80 Units into the skin daily. NEED APPOINTMENT FOR ANY MORE REFILLS (Patient  taking differently: Inject 40 Units into the skin daily. NEED APPOINTMENT FOR ANY MORE REFILLS) 30 mL 0  . UNABLE TO FIND Med Name: livago meter    . Dexlansoprazole (DEXILANT) 30 MG capsule Take 1 capsule (30 mg total) by mouth daily. (Patient not taking: Reported on 01/07/2021) 30 capsule 5   No facility-administered medications prior to visit.   Allergies  Allergen Reactions  . Influenza Vaccines Anaphylaxis    Reaction June 2017  . Fenofibrate Other (See Comments)    Hot flashes  . Metformin Other (See Comments)    Chest discomfort  . Prednisone Other (See Comments)    Mood swings   Objective:   Today's Vitals   01/07/21 1309  BP: (!) 150/77  Pulse: 68  Temp: 97.8 F (36.6 C)  TempSrc: Temporal  SpO2: 98%  Weight: 275 lb 3.2 oz (124.8 kg)  Height: 6\' 2"  (1.88 m)   Body mass index is 35.33 kg/m.   General: Well developed, well nourished. No acute distress. Extremities: There is 1-2+ edema of the right lower leg, with only trace edema on the left. There are two healign   superficial sores on the mid shin. Foot: There is dryness and skin thickening across the soles of both feet with some calluses medially at the great   toes. The right great toe nail is thickened with yellow discoloration. 5.07 monofilament testing shows loss of   sensation of the right 1st and 5th toes and at the right midfoot. The left sensation is normal. Psych: Alert and oriented. Normal mood and affect.  Health Maintenance Due  Topic Date Due  . Hepatitis C Screening  Never done  . PNEUMOCOCCAL POLYSACCHARIDE VACCINE AGE 48-64 HIGH RISK  Never done  . COVID-19 Vaccine (1) Never done  . OPHTHALMOLOGY EXAM  Never done  . COLONOSCOPY (Pts 45-35yrs Insurance coverage will need to be confirmed)  Never done  . TETANUS/TDAP  09/26/2018     Assessment & Plan:   1. Gastroesophageal reflux disease without esophagitis The recent episode has resolved and the patient is having no recurrent symptoms since  restarting his PPI.  2. Type 2 diabetes mellitus with diabetic neuropathy, with long-term current use of insulin (HCC) Followed by endocrinology. Updated medication list. The patient has been previously identified with diabetic neuropathy. He is certainly hat increased risk for foot infection. I reinforced prior guidance on routine foot care and precautions at home.   3. Onychomycosis In light of the onychomycosis and diabetic neuropathy, I will refer him to podiatry for assessment and management of both.  - Ambulatory referral to Podiatry  4. Pedal edema The pedal edema is chronic and mainly on the right, without sign of a DVT. His weight is not increased and recent cardiology assessment did not show signs of CHF. He has not sing of chronic kidney disease or liver failure. I suspect an underlying venous issue, so will refer him to vascular for assessment and management options.  - Ambulatory referral to Vascular Surgery  09/28/2018, MD

## 2021-01-10 ENCOUNTER — Telehealth: Payer: Self-pay

## 2021-01-10 NOTE — Telephone Encounter (Signed)
Pt's wife is calling to see if pt need to come in for a visit to get a work note.  Pt has missed this week due to leg swelling and aches.  Pt has a referral to vein and vascular which he did call but they told him that they will call him back to schedule an appointment.  Pt would like a call back.  Please advise, [phone] Or [phone]

## 2021-01-14 ENCOUNTER — Other Ambulatory Visit: Payer: Self-pay | Admitting: Internal Medicine

## 2021-01-14 ENCOUNTER — Encounter: Payer: Self-pay | Admitting: Family Medicine

## 2021-01-14 DIAGNOSIS — Z794 Long term (current) use of insulin: Secondary | ICD-10-CM

## 2021-01-17 ENCOUNTER — Other Ambulatory Visit: Payer: Self-pay

## 2021-01-17 DIAGNOSIS — R600 Localized edema: Secondary | ICD-10-CM

## 2021-01-21 ENCOUNTER — Encounter: Payer: Self-pay | Admitting: Surgery

## 2021-01-21 ENCOUNTER — Other Ambulatory Visit: Payer: Self-pay

## 2021-01-21 ENCOUNTER — Ambulatory Visit (HOSPITAL_COMMUNITY)
Admission: RE | Admit: 2021-01-21 | Discharge: 2021-01-21 | Disposition: A | Discharge status: Home / Self Care | Payer: BC Managed Care – PPO | Source: Ambulatory Visit | Attending: Surgery | Admitting: Surgery

## 2021-01-21 ENCOUNTER — Ambulatory Visit (INDEPENDENT_AMBULATORY_CARE_PROVIDER_SITE_OTHER): Payer: BC Managed Care – PPO | Admitting: Surgery

## 2021-01-21 ENCOUNTER — Other Ambulatory Visit: Payer: Self-pay | Admitting: Family Medicine

## 2021-01-21 VITALS — BP 136/88 | HR 90 | Temp 98.5°F | Ht 72.0 in | Wt 272.8 lb

## 2021-01-21 DIAGNOSIS — M79671 Pain in right foot: Secondary | ICD-10-CM | POA: Insufficient documentation

## 2021-01-21 DIAGNOSIS — I872 Venous insufficiency (chronic) (peripheral): Secondary | ICD-10-CM | POA: Diagnosis not present

## 2021-01-21 DIAGNOSIS — R600 Localized edema: Secondary | ICD-10-CM | POA: Insufficient documentation

## 2021-01-21 DIAGNOSIS — I10 Essential (primary) hypertension: Secondary | ICD-10-CM

## 2021-01-21 MED ORDER — AMLODIPINE BESYLATE 10 MG PO TABS: 10.0000 mg | ORAL_TABLET | Freq: Every day | ORAL | 3 refills | Status: DC

## 2021-01-21 NOTE — Progress Notes (Signed)
See MyChart encounter

## 2021-01-21 NOTE — Progress Notes (Signed)
[facility]  Patient name: Aaron Franco MRN: [id number] DOB: [birth year] Sex: male   REQUESTING PROVIDER:    Dr.Rudd   REASON FOR CONSULT:    Varicose veins  HISTORY OF PRESENT ILLNESS:   Aaron Franco is a 50 y.o. male, who is referred for bilateral leg swelling, right greater than left.  The patient states that this started a few months ago.  He denies any history of trauma.  The patient does complain of some burning tingling feelings in his feet.  He is a diabetic.  He is a former smoker.  He takes a statin for hypercholesterolemia.  He is medically managed for hypertension  PAST MEDICAL HISTORY    Past Medical History:  Diagnosis Date  . CAD (coronary artery disease)    a. 05/2013 NSTEMI/PCI: mCFX 100%-> 3.0 mm  x 20 mm Promus premier DES, EF 65%;  01/2014 Cath: LM nl, LAD 20-30d, LCX patent distal stent, RCA 30-48m, EF 55-65%.  . Diabetes mellitus   . Essential hypertension   . GERD (gastroesophageal reflux disease)   . Gout   . History of echocardiogram    a. 03/2011 Echo: EF 65%, mild LVH, no rwma, mildly dil LA.  Marland Kitchen Hyperlipidemia   . MI, old   . Obesity      FAMILY HISTORY   Family History  Problem Relation Age of Onset  . Heart attack Father        deceased  . Diabetes Father   . Congestive Heart Failure Father   . Stroke Mother   . Pancreatic cancer Maternal Aunt   . Colon cancer Neg Hx     SOCIAL HISTORY:   Social History   Socioeconomic History  . Marital status: Married    Spouse name: Not on file  . Number of children: 2  . Years of education: Not on file  . Highest education level: Not on file  Occupational History  . Occupation: Insurance underwriter: [employer]   . Occupation: Multimedia programmer: Runner, broadcasting/film/video  Tobacco Use  . Smoking status: Former Smoker    Types: Cigarettes    Quit date: 11/10/1998    Years since quitting: 22.2  . Smokeless  tobacco: Never Used  . Tobacco comment: pt only smoked 1-2 cigs a day, not everyday on occs x 2 years  Vaping Use  . Vaping Use: Never used  Substance and Sexual Activity  . Alcohol use: Yes    Comment: rare  . Drug use: No  . Sexual activity: Yes  Other Topics Concern  . Not on file  Social History Narrative  . Not on file   Social Determinants of Health   Financial Resource Strain: Not on file  Food Insecurity: Not on file  Transportation Needs: Not on file  Physical Activity: Not on file  Stress: Not on file  Social Connections: Not on file  Intimate Partner Violence: Not on file    ALLERGIES:    Allergies  Allergen Reactions  . Influenza Vaccines Anaphylaxis    Reaction June 2017  . Fenofibrate Other (See Comments)    Hot flashes  . Metformin Other (See Comments)    Chest discomfort  . Prednisone Other (See Comments)    Mood swings    CURRENT MEDICATIONS:    Current Outpatient Medications  Medication Sig Dispense Refill  . acetaminophen (TYLENOL) 325 MG tablet Take 2 tablets (650 mg total) by mouth every 4 (four)  hours as needed for headache or mild pain.    Marland Kitchen allopurinol (ZYLOPRIM) 100 MG tablet TAKE 1 TABLET(100 MG) BY MOUTH DAILY 90 tablet 0  . amLODipine (NORVASC) 10 MG tablet Take 10 mg by mouth daily.    Marland Kitchen aspirin 81 MG chewable tablet Chew 1 tablet (81 mg total) by mouth daily.    . benzonatate (TESSALON) 200 MG capsule Take 200 mg by mouth 2 (two) times daily as needed for cough.    . cetirizine (ZYRTEC) 10 MG tablet Take by mouth.    . dapagliflozin propanediol (FARXIGA) 5 MG TABS tablet Take 1 tablet (5 mg total) by mouth daily before breakfast. 90 tablet 2  . dexlansoprazole (DEXILANT) 60 MG capsule TAKE 1 CAPSULE(60 MG) BY MOUTH DAILY 90 capsule 2  . fluticasone (FLONASE) 50 MCG/ACT nasal spray Place 1 spray into both nostrils daily as needed for allergies or rhinitis. 18.2 mL 6  . gabapentin (NEURONTIN) 300 MG capsule Take one at night for one  week and then increase to one twice daily. 60 capsule 3  . insulin aspart (NOVOLOG FLEXPEN) 100 UNIT/ML FlexPen Inject 10 Units into the skin 3 (three) times daily with meals. 30 mL 2  . insulin degludec (TRESIBA FLEXTOUCH) 100 UNIT/ML FlexTouch Pen Inject 20 Units into the skin 2 (two) times daily. 45 mL 1  . Insulin Pen Needle 31G X 8 MM MISC 1 Device by Does not apply route in the morning, at noon, in the evening, and at bedtime. 400 each 2  . nebivolol (BYSTOLIC) 5 MG tablet Take 1 tablet (5 mg total) by mouth daily. 90 tablet 1  . nitroGLYCERIN (NITROSTAT) 0.4 MG SL tablet Place 1 tablet (0.4 mg total) under the tongue every 5 (five) minutes x 3 doses as needed for chest pain. 25 tablet 2  . Omega-3 Fatty Acids (FISH OIL) 1000 MG CAPS Take 1,000 mg by mouth 2 (two) times a day.    . rosuvastatin (CRESTOR) 40 MG tablet Take 40 mg by mouth daily.    . vitamin B-12 (CYANOCOBALAMIN) 500 MCG tablet Take 1 tablet (500 mcg total) by mouth daily. 90 tablet 1   No current facility-administered medications for this visit.    REVIEW OF SYSTEMS:   [X]  denotes positive finding, [ ]  denotes negative finding Cardiac  Comments:  Chest pain or chest pressure:    Shortness of breath upon exertion:    Short of breath when lying flat:    Irregular heart rhythm:        Vascular    Pain in calf, thigh, or hip brought on by ambulation:    Pain in feet at night that wakes you up from your sleep:     Blood clot in your veins:    Leg swelling:  x       Pulmonary    Oxygen at home:    Productive cough:     Wheezing:         Neurologic    Sudden weakness in arms or legs:     Sudden numbness in arms or legs:     Sudden onset of difficulty speaking or slurred speech:    Temporary loss of vision in one eye:     Problems with dizziness:         Gastrointestinal    Blood in stool:      Vomited blood:         Genitourinary    Burning when urinating:  Blood in urine:        Psychiatric     Major depression:         Hematologic    Bleeding problems:    Problems with blood clotting too easily:        Skin    Rashes or ulcers:        Constitutional    Fever or chills:     PHYSICAL EXAM:   Vitals:   01/21/21 1141  BP: 136/88  Pulse: 90  Temp: 98.5 F (36.9 C)  TempSrc: Skin  SpO2: 97%  Weight: 272 lb 12.8 oz (123.7 kg)  Height: 6' (1.829 m)    GENERAL: The patient is a well-nourished male, in no acute distress. The vital signs are documented above. CARDIAC: There is a regular rate and rhythm.  VASCULAR: Palpable dorsalis pedis pulses bilaterally.  1+ edema bilaterally.  I evaluated his right saphenous vein with ultrasound.  In the mid thigh, it still remains fairly small. PULMONARY: Nonlabored respirations ABDOMEN: Soft and non-tender with normal pitched bowel sounds.  MUSCULOSKELETAL: There are no major deformities or cyanosis. NEUROLOGIC: No focal weakness or paresthesias are detected. SKIN: There are no ulcers or rashes noted. PSYCHIATRIC: The patient has a normal affect.  STUDIES:   I have reviewed the following :Venous Reflux Times  +--------------+---------+------+-----------+------------+--------+  RIGHT     Reflux NoRefluxReflux TimeDiameter cmsComments               Yes                   +--------------+---------+------+-----------+------------+--------+  CFV            yes  >1 second             +--------------+---------+------+-----------+------------+--------+  FV mid          yes  >1 second             +--------------+---------+------+-----------+------------+--------+  Popliteal   no                         +--------------+---------+------+-----------+------------+--------+  GSV at SFJ        yes  >500 ms   0.77        +--------------+---------+------+-----------+------------+--------+   GSV prox thigh      yes  >500 ms   0.79        +--------------+---------+------+-----------+------------+--------+  GSV mid thigh       yes  >500 ms   0.3         +--------------+---------+------+-----------+------------+--------+  GSV dist thighno               0.32        +--------------+---------+------+-----------+------------+--------+  GSV at knee  no               0.26        +--------------+---------+------+-----------+------------+--------+  GSV prox calf no               0.26        +--------------+---------+------+-----------+------------+--------+  SSV Pop Fossa no               0.14        +--------------+---------+------+-----------+------------+--------+  SSV prox calf no               0.14        +--------------+---------+------+-----------+------------+--------+  SSV mid calf       yes  >500 ms   0.17        +--------------+---------+------+-----------+------------+--------+  Summary:  Right:  - No evidence of deep vein thrombosis seen in the right lower extremity,  from the common femoral through the popliteal veins.  - No evidence of superficial venous thrombosis in the right lower  extremity.  - Venous reflux is noted in the right common femoral vein.  - Venous reflux is noted in the right sapheno-femoral junction.  - Venous reflux is noted in the right greater saphenous vein in the thigh.  - Venous reflux is noted in the right femoral vein.  - Venous reflux is noted in the right short saphenous vein mid calf.   ASSESSMENT and PLAN   Bilateral leg swelling: I suspect that this is multifactorial in etiology.  He does have mild reflux in the saphenous vein on the right for short segment.  I looked at this with ultrasound and was not very impressed by the size  of the vein.  In addition he is a diabetic and has already had a coronary stent placed.  I do not think ablating his vein would be in his best interest long-term.  Therefore I recommend placing him in compression stockings.  We talked about getting 20-30 knee-high stockings.  He will follow me on as-needed basis.  I also told him that he could be suffering from neuropathy secondary to his diabetes and his feet and he should consider starting Neurontin but I would defer this to his medical doctor.   Charlena CrossWells Alrick Cubbage, IV, MD, FACS [facility] Tel [phone] Pager [phone]

## 2021-01-22 ENCOUNTER — Other Ambulatory Visit: Payer: Self-pay

## 2021-01-23 ENCOUNTER — Ambulatory Visit (INDEPENDENT_AMBULATORY_CARE_PROVIDER_SITE_OTHER): Payer: BC Managed Care – PPO | Admitting: Family Medicine

## 2021-01-23 ENCOUNTER — Encounter: Payer: Self-pay | Admitting: Family Medicine

## 2021-01-23 VITALS — BP 132/82 | HR 88 | Temp 98.6°F | Ht 72.0 in | Wt 275.4 lb

## 2021-01-23 DIAGNOSIS — K219 Gastro-esophageal reflux disease without esophagitis: Secondary | ICD-10-CM

## 2021-01-23 DIAGNOSIS — E1142 Type 2 diabetes mellitus with diabetic polyneuropathy: Secondary | ICD-10-CM | POA: Diagnosis not present

## 2021-01-23 DIAGNOSIS — Z794 Long term (current) use of insulin: Secondary | ICD-10-CM

## 2021-01-23 DIAGNOSIS — I872 Venous insufficiency (chronic) (peripheral): Secondary | ICD-10-CM

## 2021-01-23 DIAGNOSIS — E1140 Type 2 diabetes mellitus with diabetic neuropathy, unspecified: Secondary | ICD-10-CM

## 2021-01-23 MED ORDER — GABAPENTIN 600 MG PO TABS: 600.0000 mg | ORAL_TABLET | Freq: Three times a day (TID) | ORAL | 3 refills | Status: DC

## 2021-01-23 NOTE — Telephone Encounter (Signed)
Patient had an appointment in office today where he received note for work.

## 2021-01-23 NOTE — Patient Instructions (Signed)
Diabetic Neuropathy Diabetic neuropathy refers to nerve damage that is caused by diabetes. Over time, people with diabetes can develop nerve damage throughout the body. There are several types of diabetic neuropathy:  Peripheral neuropathy. This is the most common type of diabetic neuropathy. It damages the nerves that carry signals between the spinal cord and other parts of the body (peripheral nerves). This usually affects nerves in the feet, legs, hands, and arms.  Autonomic neuropathy. This type causes damage to nerves that control involuntary functions (autonomic nerves). Involuntary functions are functions of the body that you do not control. They include heartbeat, body temperature, blood pressure, urination, digestion, sweating, sexual function, or response to changes in blood glucose.  Focal neuropathy. This type of nerve damage affects one area of the body, such as an arm, a leg, or the face. The injury may involve one nerve or a small group of nerves. Focal neuropathy can be painful and unpredictable. It occurs most often in older adults with diabetes. This often develops suddenly, but usually improves over time and does not cause long-term problems.  Proximal neuropathy. This type of nerve damage affects the nerves of the thighs, hips, buttocks, or legs. It causes severe pain, weakness, and muscle death (atrophy), usually in the thigh muscles. It is more common among older men and people who have type 2 diabetes. The length of recovery time may vary. What are the causes? Peripheral, autonomic, and focal neuropathies are caused by diabetes that is not well controlled with treatment. The cause of proximal neuropathy is not known, but it may be caused by inflammation related to uncontrolled blood glucose levels. What are the signs or symptoms? Peripheral neuropathy Peripheral neuropathy develops slowly over time. When the nerves of the feet and legs no longer work, you may  experience:  Burning, stabbing, or aching pain in the legs or feet.  Pain or cramping in the legs or feet.  Loss of feeling (numbness) and inability to feel pressure or pain in the feet. This can lead to: ? Thick calluses or sores on areas of constant pressure. ? Ulcers. ? Reduced ability to feel temperature changes.  Foot deformities.  Muscle weakness.  Loss of balance or coordination. Autonomic neuropathy The symptoms of autonomic neuropathy vary depending on which nerves are affected. Symptoms may include:  Problems with digestion, such as: ? Nausea or vomiting. ? Poor appetite. ? Bloating. ? Diarrhea or constipation. ? Trouble swallowing. ? Losing weight without trying to.  Problems with the heart, blood, and lungs, such as: ? Dizziness, especially when standing up. ? Fainting. ? Shortness of breath. ? Irregular heartbeat.  Bladder problems, such as: ? Trouble starting or stopping urination. ? Leaking urine. ? Trouble emptying the bladder. ? Urinary tract infections (UTIs).  Problems with other body functions, such as: ? Sweat. You may sweat too much or too little. ? Temperature. You might get hot easily. Or, you might feel cold more than usual. ? Sexual function. Men may not be able to get or maintain an erection. Women may have vaginal dryness and difficulty with arousal. Focal neuropathy Symptoms affect only one area of the body. Common symptoms include:  Numbness.  Tingling.  Burning pain.  Prickling feeling.  Very sensitive skin.  Weakness.  Inability to move (paralysis).  Muscle twitching.  Muscles getting smaller (wasting).  Poor coordination.  Double or blurred vision. Proximal neuropathy  Sudden, severe pain in the hip, thigh, or buttocks. Pain may spread from the back into the legs (  sciatica).  Pain and numbness in the arms and legs.  Tingling.  Loss of bladder control or bowel control.  Weakness and wasting of thigh  muscles.  Difficulty getting up from a seated position.  Abdominal swelling.  Unexplained weight loss. How is this diagnosed? Diagnosis varies depending on the type of neuropathy your health care provider suspects. Peripheral neuropathy Your health care provider will do a neurologic exam. This exam checks your reflexes, how you move, and what you can feel. You may have other tests, such as:  Blood tests.  Tests of the fluid that surrounds the spinal cord (lumbar puncture).  CT scan.  MRI.  Checking the nerves that control muscles (electromyogram, or EMG).  Checking how quickly signals pass through your nerves (nerve conduction study).  Checking a small piece of a nerve using a microscope (biopsy). Autonomic neuropathy You may have tests, such as:  Tests to measure your blood pressure and heart rate. You may be secured to an exam table that moves you from a lying position to an upright position (table tilt test).  Breathing tests to check your lungs.  Tests to check how food moves through the digestive system (gastric emptying tests).  Blood, sweat, or urine tests.  Ultrasound of your bladder.  Spinal fluid tests. Focal neuropathy This condition may be diagnosed with:  A neurologic exam.  CT scan.  MRI.  EMG.  Nerve conduction study. Proximal neuropathy There is no test to diagnose this type of neuropathy. You may have tests to rule out other possible causes of this type of neuropathy. Tests may include:  X-rays of your spine and lumbar region.  Lumbar puncture.  MRI. How is this treated? The goal of treatment is to keep nerve damage from getting worse. Treatment may include:  Following your diabetes management plan. This will help keep your blood glucose level and your A1C level within your target range. This is the most important treatment.  Using prescription pain medicine. Follow these instructions at home: Diabetes management Follow your diabetes  management plan as told by your health care provider.  Check your blood glucose levels.  Keep your blood glucose in your target range.  Have your A1C level checked at least two times a year, or as often as told.  Take over the counter and prescription medicines only as told by your health care provider. This includes insulin and diabetes medicine.   Lifestyle  Do not use any products that contain nicotine or tobacco, such as cigarettes, e-cigarettes, and chewing tobacco. If you need help quitting, ask your health care provider.  Be physically active every day. Include strength training and balance exercises.  Follow a healthy meal plan.  Work with your health care provider to manage your blood pressure.   General instructions  Ask your health care provider if the medicine prescribed to you requires you to avoid driving or using machinery.  Check your skin and feet every day for cuts, bruises, redness, blisters, or sores.  Keep all follow-up visits. This is important. Contact a health care provider if:  You have burning, stabbing, or aching pain in your legs or feet.  You are unable to feel pressure or pain in your feet.  You develop problems with digestion, such as: ? Nausea. ? Vomiting. ? Bloating. ? Constipation. ? Diarrhea. ? Abdominal pain.  You have difficulty with urination, such as: ? Inability to control when you urinate (incontinence). ? Inability to completely empty the bladder (retention).  You feel   as if your heart is racing (palpitations).  You feel dizzy, weak, or faint when you stand up. Get help right away if:  You cannot urinate.  You have sudden weakness or loss of coordination.  You have trouble speaking.  You have pain or pressure in your chest.  You have an irregular heartbeat.  You have sudden inability to move a part of your body. These symptoms may represent a serious problem that is an emergency. Do not wait to see if the symptoms  will go away. Get medical help right away. Call your local emergency services (911 in the [place] Do not drive yourself to the hospital. Summary  Diabetic neuropathy is nerve damage that is caused by diabetes. It can cause numbness and pain in the arms, legs, digestive tract, heart, and other body systems.  This condition is treated by keeping your blood glucose level and your A1C level within your target range. This can help prevent neuropathy from getting worse.  Check your skin and feet every day for cuts, bruises, redness, blisters, or sores.  Do not use any products that contain nicotine or tobacco, such as cigarettes, e-cigarettes, and chewing tobacco. This information is not intended to replace advice given to you by your health care provider. Make sure you discuss any questions you have with your health care provider. Document Revised: 03/08/2020 Document Reviewed: 03/08/2020 Elsevier Patient Education  2021 [employer].  

## 2021-01-23 NOTE — Progress Notes (Signed)
[facility] [address] [place] [address] Dept: [phone] Dept Fax: [phone]  Chronic Care Office Visit  Subjective:    Patient ID: Ender Rorke, male    DOB: 06/03/1971, 51 y.o..   MRN: [id number]  Chief Complaint  Patient presents with  . Acute Visit    C/o having pain in both legs/feet x 2 months.   He has been taking Tylenol with little relief.       History of Present Illness:  Patient is in today for reassessment of chronic medical issues. He was recently seen by vascular surgery for evaluation of his lower leg swelling. He was found to have some vein incompetence, but was not felt to be a surgical candidate due to his heart condition. He has ordered his compression stockings, but these have not arrived yet.  Mr. Winfree continues to complain of bilateral lower extremity pain and numbness. He notes a tingling sensation in the feet. He has more of a burning sensation to the lower legs. He occasionally notes some shooting pains in the upper legs. Mr. Hightower was previously on gabapentin for intermittent leg pain. He found it difficult to swallow the capsule form of the medication. Apparently his GERD and possibly other issues make it hard for him to swallow some medications. He is left with a feeling of chest tightness and breathlessness.  Past Medical History: Patient Active Problem List   Diagnosis Date Noted  . Chronic venous insufficiency of lower extremity 01/23/2021  . Type 2 diabetes mellitus with hyperglycemia, with long-term current use of insulin (HCC) 12/07/2020  . Allergic rhinitis due to pollen 06/29/2020  . Diabetic neuropathy (HCC) 03/13/2020  . Erectile dysfunction due to arterial insufficiency 03/13/2020  . History of gout 11/28/2019  . Coronary artery disease involving native heart without angina pectoris 03/29/2019  . Microalbuminuria due to type 2 diabetes mellitus (HCC) 12/14/2017  .  Onychomycosis 12/01/2017  . Morbid obesity with BMI of 40.0-44.9, adult (HCC) 02/18/2016  . Chest pain with moderate risk of acute coronary syndrome 09/17/2015  . Type 2 diabetes mellitus with diabetic neuropathy, unspecified (HCC) 04/23/2015  . Thrush 04/23/2015  . Type II diabetes mellitus with renal manifestations (HCC) 02/16/2014  . 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  . Hammer toe, acquired 02/16/2014  . CAD S/P percutaneous coronary angioplasty -  01/24/2014  . Murmur 02/21/2011  . Pedal edema 12/24/2010  . Vitamin B12 deficiency 07/19/2010  . Anemia due to vitamin B12 deficiency 10/31/2009  . Trigger finger 10/16/2009  . Dyslipidemia 03/01/2007  . Gout 03/01/2007  . Essential hypertension 03/01/2007  . Gastroesophageal reflux disease 03/01/2007   Past Surgical History:  Procedure Laterality Date  . BICEPS TENDON REPAIR  Nov 13 2010   right  . HAND SURGERY  1990   left  . LEFT HEART CATH N/A 05/12/2013   Procedure: LEFT HEART CATH;  Surgeon: Lesleigh Noe, MD;  Location: [facility] CATH LAB;  Service: Cardiovascular;  Laterality: N/A;  . LEFT HEART CATHETERIZATION WITH CORONARY ANGIOGRAM N/A 01/24/2014   Procedure: LEFT HEART CATHETERIZATION WITH CORONARY ANGIOGRAM;  Surgeon: Peter M Swaziland, MD;  Location: [facility] CATH LAB;  Service: Cardiovascular;  Laterality: N/A;  . PERCUTANEOUS CORONARY STENT INTERVENTION (PCI-S)  05/2013   mCx 3.48mm x 20 mm Promus P DES  . PERCUTANEOUS CORONARY STENT INTERVENTION (PCI-S)  05/12/2013   Procedure: PERCUTANEOUS CORONARY STENT INTERVENTION (PCI-S);  Surgeon: Lesleigh Noe, MD;  Location: [facility] CATH LAB;  Service:  Cardiovascular;;   Family History  Problem Relation Age of Onset  . Heart attack Father        deceased  . Diabetes Father   . Congestive Heart Failure Father   . Stroke Mother   . Pancreatic cancer Maternal Aunt   . Colon cancer Neg Hx    Outpatient Medications Prior to Visit  Medication Sig Dispense Refill  . acetaminophen (TYLENOL)  325 MG tablet Take 2 tablets (650 mg total) by mouth every 4 (four) hours as needed for headache or mild pain.    Marland Kitchen allopurinol (ZYLOPRIM) 100 MG tablet TAKE 1 TABLET(100 MG) BY MOUTH DAILY 90 tablet 0  . amLODipine (NORVASC) 10 MG tablet Take 1 tablet (10 mg total) by mouth daily. 90 tablet 3  . aspirin 81 MG chewable tablet Chew 1 tablet (81 mg total) by mouth daily.    . benzonatate (TESSALON) 200 MG capsule Take 200 mg by mouth 2 (two) times daily as needed for cough.    . cetirizine (ZYRTEC) 10 MG tablet Take by mouth.    . dapagliflozin propanediol (FARXIGA) 5 MG TABS tablet Take 1 tablet (5 mg total) by mouth daily before breakfast. 90 tablet 2  . dexlansoprazole (DEXILANT) 60 MG capsule TAKE 1 CAPSULE(60 MG) BY MOUTH DAILY 90 capsule 2  . fluticasone (FLONASE) 50 MCG/ACT nasal spray Place 1 spray into both nostrils daily as needed for allergies or rhinitis. 18.2 mL 6  . insulin aspart (NOVOLOG FLEXPEN) 100 UNIT/ML FlexPen Inject 10 Units into the skin 3 (three) times daily with meals. 30 mL 2  . insulin degludec (TRESIBA FLEXTOUCH) 100 UNIT/ML FlexTouch Pen Inject 20 Units into the skin 2 (two) times daily. 45 mL 1  . Insulin Pen Needle 31G X 8 MM MISC 1 Device by Does not apply route in the morning, at noon, in the evening, and at bedtime. 400 each 2  . nebivolol (BYSTOLIC) 5 MG tablet Take 1 tablet (5 mg total) by mouth daily. 90 tablet 1  . nitroGLYCERIN (NITROSTAT) 0.4 MG SL tablet Place 1 tablet (0.4 mg total) under the tongue every 5 (five) minutes x 3 doses as needed for chest pain. 25 tablet 2  . Omega-3 Fatty Acids (FISH OIL) 1000 MG CAPS Take 1,000 mg by mouth 2 (two) times a day.    . rosuvastatin (CRESTOR) 40 MG tablet Take 40 mg by mouth daily.    . vitamin B-12 (CYANOCOBALAMIN) 500 MCG tablet Take 1 tablet (500 mcg total) by mouth daily. 90 tablet 1  . gabapentin (NEURONTIN) 300 MG capsule Take one at night for one week and then increase to one twice daily. 60 capsule 3   No  facility-administered medications prior to visit.   Allergies  Allergen Reactions  . Influenza Vaccines Anaphylaxis    Reaction June 2017  . Fenofibrate Other (See Comments)    Hot flashes  . Metformin Other (See Comments)    Chest discomfort  . Prednisone Other (See Comments)    Mood swings    Objective:   Today's Vitals   01/23/21 1307  BP: 132/82  Pulse: 88  Temp: 98.6 F (37 C)  TempSrc: Temporal  SpO2: 98%  Weight: 275 lb 6.4 oz (124.9 kg)  Height: 6' (1.829 m)   Body mass index is 37.35 kg/m.   General: Well developed, well nourished. No acute distress. Feet: Skin intact, but with some dryness to the soles. Right great toel nail is crumbled and yellow discolored. PT  and DP pulses 2+ bilaterally. Psych: Alert and oriented x3. Normal mood and affect.  Health Maintenance Due  Topic Date Due  . Hepatitis C Screening  Never done  . PNEUMOCOCCAL POLYSACCHARIDE VACCINE AGE 55-64 HIGH RISK  Never done  . COVID-19 Vaccine (1) Never done  . OPHTHALMOLOGY EXAM  Never done  . COLONOSCOPY (Pts 45-85yrs Insurance coverage will need to be confirmed)  Never done  . TETANUS/TDAP  09/26/2018  . URINE MICROALBUMIN  03/15/2021      Assessment & Plan:   1. Diabetic polyneuropathy associated with type 2 diabetes mellitus (HCC) 2. Type 2 diabetes mellitus with diabetic neuropathy, with long-term current use of insulin [facility])  Mr. Lant has diabetic neuropathy with both numbness and pain. We discussed the importance of diabetes  control to halt or slow progression of this disease. I will start him back on gabapentin. We will use the 600 mg dose, which is available in tablet form, so he can split the pill, if he has trouble swallowing it. I want to reassess him in about a month. We did discuss if his conditions are making it so that he cannot work for now. I recommended he check into short-term disability through his work.  - gabapentin (NEURONTIN) 600 MG tablet; Take 1 tablet  (600 mg total) by mouth 3 (three) times daily.  Dispense: 90 tablet; Refill: 3   3. Gastroesophageal reflux disease without esophagitis  Finding Mr. Counts has trouble swallowing many meds, which may be secondary to his GERD. We will continue to monitor this. He may need an upper endoscopy to assess.    Loyola Mast, MD

## 2021-02-18 ENCOUNTER — Encounter: Payer: Self-pay | Admitting: Family Medicine

## 2021-02-19 ENCOUNTER — Other Ambulatory Visit: Payer: Self-pay

## 2021-02-19 DIAGNOSIS — I2510 Atherosclerotic heart disease of native coronary artery without angina pectoris: Secondary | ICD-10-CM

## 2021-02-19 MED ORDER — NEBIVOLOL HCL 5 MG PO TABS: 5.0000 mg | ORAL_TABLET | Freq: Every day | ORAL | 1 refills | Status: DC

## 2021-02-21 ENCOUNTER — Other Ambulatory Visit: Payer: Self-pay

## 2021-02-25 ENCOUNTER — Ambulatory Visit: Payer: Federal, State, Local not specified - PPO | Admitting: Podiatry

## 2021-02-25 ENCOUNTER — Other Ambulatory Visit: Payer: Self-pay

## 2021-02-26 ENCOUNTER — Encounter: Payer: Self-pay | Admitting: Family Medicine

## 2021-02-26 ENCOUNTER — Ambulatory Visit (INDEPENDENT_AMBULATORY_CARE_PROVIDER_SITE_OTHER): Payer: BC Managed Care – PPO | Admitting: Family Medicine

## 2021-02-26 VITALS — BP 144/80 | HR 84 | Temp 97.4°F | Ht 72.0 in | Wt 284.2 lb

## 2021-02-26 DIAGNOSIS — Z794 Long term (current) use of insulin: Secondary | ICD-10-CM

## 2021-02-26 DIAGNOSIS — I10 Essential (primary) hypertension: Secondary | ICD-10-CM

## 2021-02-26 DIAGNOSIS — E1142 Type 2 diabetes mellitus with diabetic polyneuropathy: Secondary | ICD-10-CM | POA: Diagnosis not present

## 2021-02-26 DIAGNOSIS — E1165 Type 2 diabetes mellitus with hyperglycemia: Secondary | ICD-10-CM | POA: Diagnosis not present

## 2021-02-26 DIAGNOSIS — I872 Venous insufficiency (chronic) (peripheral): Secondary | ICD-10-CM

## 2021-02-26 DIAGNOSIS — Z1211 Encounter for screening for malignant neoplasm of colon: Secondary | ICD-10-CM

## 2021-02-26 DIAGNOSIS — J40 Bronchitis, not specified as acute or chronic: Secondary | ICD-10-CM

## 2021-02-26 NOTE — Progress Notes (Signed)
[facility]ARE [facility] [address] [place] [address] Dept: [phone] Dept Fax: [phone]  Chronic Care Office Visit  Subjective:    Patient ID: Aaron Franco, male    DOB: 05/30/1971, 50 y.o..   MRN: [id number]  Chief Complaint  Patient presents with  . Follow-up    F/u Diabetic Neuropathy/meds.  Declines flu and covid vaccines.     History of Present Illness:  Patient is in today for reassessment of chronic medical issues.  Aaron Franco was started on gabapentin for diabetic neuropathy at his last visit. He notes this is improved on the medication. He has also since been able to obtain his compression stockings for his lower leg edema. He is very pleased with how these are helping. He has been able to return to work and feels positive about this.  Aaron Franco wants to wait another month prior to doing lab work for his diabetes. He notes that he is making changes to try and improve this.  Aaron Franco has hypertension and is currently on amlodipine and nebivilol for this.   Aaron Franco was recently seen at Urgent Care for a respiratory illness. He notes he was treated with a Z-pack, but did not see any significant improvement. He has been on promethazine-DM cough syrup. He notes he is having a lingering cough that has been present for 3 weeks now.  Past Medical History: Patient Active Problem List   Diagnosis Date Noted  . Chronic venous insufficiency of lower extremity 01/23/2021  . Type 2 diabetes mellitus with hyperglycemia, with long-term current use of insulin (HCC) 12/07/2020  . Allergic rhinitis due to pollen 06/29/2020  . Diabetic neuropathy (HCC) 03/13/2020  . Erectile dysfunction due to arterial insufficiency 03/13/2020  . History of gout 11/28/2019  . Coronary artery disease involving native heart without angina pectoris 03/29/2019  . Onychomycosis 12/01/2017  . Morbid obesity with BMI of 40.0-44.9, adult  (HCC) 02/18/2016  . Chest pain with moderate risk of acute coronary syndrome 09/17/2015  . Type 2 diabetes mellitus with diabetic neuropathy, unspecified (HCC) 04/23/2015  . Thrush 04/23/2015  . Type II diabetes mellitus with renal manifestations (HCC) 02/16/2014  . 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  . Hammer toe, acquired 02/16/2014  . CAD S/P percutaneous coronary angioplasty -  01/24/2014  . Murmur 02/21/2011  . Vitamin B12 deficiency 07/19/2010  . Anemia due to vitamin B12 deficiency 10/31/2009  . Trigger finger 10/16/2009  . Dyslipidemia 03/01/2007  . Gout 03/01/2007  . Essential hypertension 03/01/2007  . Gastroesophageal reflux disease 03/01/2007   Past Surgical History:  Procedure Laterality Date  . BICEPS TENDON REPAIR  Nov 13 2010   right  . HAND SURGERY  1990   left  . LEFT HEART CATH N/A 05/12/2013   Procedure: LEFT HEART CATH;  Surgeon: Lesleigh Noe, MD;  Location: [facility] CATH LAB;  Service: Cardiovascular;  Laterality: N/A;  . LEFT HEART CATHETERIZATION WITH CORONARY ANGIOGRAM N/A 01/24/2014   Procedure: LEFT HEART CATHETERIZATION WITH CORONARY ANGIOGRAM;  Surgeon: Peter M Swaziland, MD;  Location: [facility] CATH LAB;  Service: Cardiovascular;  Laterality: N/A;  . PERCUTANEOUS CORONARY STENT INTERVENTION (PCI-S)  05/2013   mCx 3.3mm x 20 mm Promus P DES  . PERCUTANEOUS CORONARY STENT INTERVENTION (PCI-S)  05/12/2013   Procedure: PERCUTANEOUS CORONARY STENT INTERVENTION (PCI-S);  Surgeon: Lesleigh Noe, MD;  Location: [facility] CATH LAB;  Service: Cardiovascular;;   Family History  Problem Relation Age of Onset  . Heart  attack Father        deceased  . Diabetes Father   . Congestive Heart Failure Father   . Stroke Mother   . Pancreatic cancer Maternal Aunt   . Colon cancer Neg Hx    Outpatient Medications Prior to Visit  Medication Sig Dispense Refill  . acetaminophen (TYLENOL) 325 MG tablet Take 2 tablets (650 mg total) by mouth every 4 (four) hours as needed for headache or  mild pain.    Marland Kitchen allopurinol (ZYLOPRIM) 100 MG tablet TAKE 1 TABLET(100 MG) BY MOUTH DAILY 90 tablet 0  . amLODipine (NORVASC) 10 MG tablet Take 1 tablet (10 mg total) by mouth daily. 90 tablet 3  . aspirin 81 MG chewable tablet Chew 1 tablet (81 mg total) by mouth daily.    . cetirizine (ZYRTEC) 10 MG tablet Take by mouth.    . dapagliflozin propanediol (FARXIGA) 5 MG TABS tablet Take 1 tablet (5 mg total) by mouth daily before breakfast. 90 tablet 2  . dexlansoprazole (DEXILANT) 60 MG capsule TAKE 1 CAPSULE(60 MG) BY MOUTH DAILY 90 capsule 2  . fluticasone (FLONASE) 50 MCG/ACT nasal spray Place 1 spray into both nostrils daily as needed for allergies or rhinitis. 18.2 mL 6  . gabapentin (NEURONTIN) 600 MG tablet Take 1 tablet (600 mg total) by mouth 3 (three) times daily. 90 tablet 3  . insulin aspart (NOVOLOG FLEXPEN) 100 UNIT/ML FlexPen Inject 10 Units into the skin 3 (three) times daily with meals. 30 mL 2  . insulin degludec (TRESIBA FLEXTOUCH) 100 UNIT/ML FlexTouch Pen Inject 20 Units into the skin 2 (two) times daily. 45 mL 1  . Insulin Pen Needle 31G X 8 MM MISC 1 Device by Does not apply route in the morning, at noon, in the evening, and at bedtime. 400 each 2  . nebivolol (BYSTOLIC) 5 MG tablet Take 1 tablet (5 mg total) by mouth daily. 90 tablet 1  . nitroGLYCERIN (NITROSTAT) 0.4 MG SL tablet Place 1 tablet (0.4 mg total) under the tongue every 5 (five) minutes x 3 doses as needed for chest pain. 25 tablet 2  . Omega-3 Fatty Acids (FISH OIL) 1000 MG CAPS Take 1,000 mg by mouth 2 (two) times a day.    . rosuvastatin (CRESTOR) 40 MG tablet Take 40 mg by mouth daily.    . vitamin B-12 (CYANOCOBALAMIN) 500 MCG tablet Take 1 tablet (500 mcg total) by mouth daily. 90 tablet 1  . azithromycin (ZITHROMAX) 250 MG tablet Take 250 mg by mouth as directed. (Patient not taking: Reported on 02/26/2021)    . benzonatate (TESSALON) 200 MG capsule Take 200 mg by mouth 2 (two) times daily as needed for  cough. (Patient not taking: Reported on 02/26/2021)     No facility-administered medications prior to visit.   Allergies  Allergen Reactions  . Influenza Vaccines Anaphylaxis    Reaction June 2017  . Influenza Virus Vaccine H5n1 Anaphylaxis  . Fenofibrate Other (See Comments)    Hot flashes  . Metformin Other (See Comments)    Chest discomfort  . Prednisone Other (See Comments)    Mood swings     Objective:   Today's Vitals   02/26/21 0958  BP: (!) 144/80  Pulse: 84  Temp: (!) 97.4 F (36.3 C)  TempSrc: Temporal  SpO2: 98%  Weight: 284 lb 3.2 oz (128.9 kg)  Height: 6' (1.829 m)   Body mass index is 38.54 kg/m.   General: Well developed, well nourished. No acute distress.  Lungs: Clear to auscultation bilaterally. No wheezing, rales or rhonchi. Extremities: No edema noted today. The superficial sores are showing improvement. Skin on feet are intact. No sores or ulcers present. Psych: Alert and oriented. Normal mood and affect.  Health Maintenance Due  Topic Date Due  . Hepatitis C Screening  Never done  . PNEUMOCOCCAL POLYSACCHARIDE VACCINE AGE 37-64 HIGH RISK  Never done  . COVID-19 Vaccine (1) Never done  . OPHTHALMOLOGY EXAM  Never done  . COLONOSCOPY (Pts 45-4yrs Insurance coverage will need to be confirmed)  Never done  . TETANUS/TDAP  09/26/2018  . URINE MICROALBUMIN  03/15/2021     Assessment & Plan:   1. Type 2 diabetes mellitus with hyperglycemia, with long-term current use of insulin (HCC) Currently managed on dapagliflozin Marcelline Deist) and insulin (aspart and degludec). His last Hba1c was 14.1 (11/30/20). He is due for reassessment of this, but I agree to hold off an additional month. At his next visit, we will plan fasting labs. We also discussed the need for PPS 23 and tetanus vaccines. He has not received COVID vaccines, despite being high risk. He notes a concern about the vaccine, but states he is reading and watching what happens to decide when and if to  get it. I did provide my recommendation that he see about getting this vaccine.  2. Diabetic polyneuropathy associated with type 2 diabetes mellitus (HCC) Treating neuropathy with gabapentin. This is improved.  3. Chronic venous insufficiency of lower extremity Now using compression stockings. Edema is improved. I encouraged him to keep with this approach.  4. Essential hypertension Blood pressure mildly high today. If this persists, would consider addition of an ACE-I in light of his microalbuminuria.  5. Bronchitis We discussed the nature of cough associated with bronchitis. This may persist for a month. I reviewed options to help his cough. He will consider [place] of an OTC cough suppressant.  6. Screening for colon cancer  - Ambulatory referral to Gastroenterology    Loyola Mast, MD

## 2021-03-28 ENCOUNTER — Ambulatory Visit: Payer: BC Managed Care – PPO | Admitting: Family Medicine

## 2021-04-04 ENCOUNTER — Encounter: Payer: BC Managed Care – PPO | Admitting: Family Medicine

## 2021-04-09 ENCOUNTER — Telehealth: Payer: Self-pay | Admitting: Cardiology

## 2021-04-09 NOTE — Telephone Encounter (Signed)
[facility] received forms from [employer], forms were put in Dr.Hochrein box on 5/31.

## 2021-04-12 ENCOUNTER — Encounter: Payer: Self-pay | Admitting: *Deleted

## 2021-04-29 ENCOUNTER — Ambulatory Visit: Payer: BC Managed Care – PPO | Admitting: Family Medicine

## 2021-05-02 NOTE — Telephone Encounter (Signed)
Forms completed and faxed to [employer] on 6/23.

## 2021-05-07 ENCOUNTER — Telehealth: Payer: Self-pay | Admitting: Cardiology

## 2021-05-07 NOTE — Telephone Encounter (Signed)
Spoke to [employer] (pt caseworker at [facility]) she emailed me the updated paperwork that needs to be completed. The original forms patient sent in were not the most updated, and missing a form.

## 2021-05-07 NOTE — Telephone Encounter (Signed)
Forms received back from provider, and faxed to [employer] attn : Precious Batts.

## 2021-05-07 NOTE — Telephone Encounter (Signed)
Spoke to [employer] (pt caseworker at [facility]) she emailed me the updated paperwork that needs to be completed. The original forms patient sent in were not the most updated, and missing a form.   Updated forms were given to Victorio Palm. 6/28

## 2021-05-10 ENCOUNTER — Ambulatory Visit: Payer: BC Managed Care – PPO | Admitting: Internal Medicine

## 2021-06-28 ENCOUNTER — Ambulatory Visit: Payer: BC Managed Care – PPO | Admitting: Family Medicine

## 2021-07-25 ENCOUNTER — Telehealth: Payer: Self-pay | Admitting: Family Medicine

## 2021-07-25 ENCOUNTER — Other Ambulatory Visit: Payer: Self-pay | Admitting: Family Medicine

## 2021-07-26 NOTE — Telephone Encounter (Signed)
Lft VM that rx was sent to the pharmacy.  Dm/cma  

## 2021-09-18 ENCOUNTER — Other Ambulatory Visit: Payer: Self-pay | Admitting: Family Medicine

## 2021-09-18 DIAGNOSIS — K219 Gastro-esophageal reflux disease without esophagitis: Secondary | ICD-10-CM

## 2021-09-18 NOTE — Telephone Encounter (Signed)
Chart supports rx refill Last ov: 02/26/2021 Last refill: 02/26/2021

## 2021-09-18 NOTE — Telephone Encounter (Signed)
Pt called requesting this refill saying it is urgent.

## 2021-09-19 ENCOUNTER — Other Ambulatory Visit: Payer: Self-pay | Admitting: Family

## 2021-09-19 DIAGNOSIS — K219 Gastro-esophageal reflux disease without esophagitis: Secondary | ICD-10-CM

## 2021-09-30 ENCOUNTER — Other Ambulatory Visit: Payer: Self-pay | Admitting: Family Medicine

## 2021-09-30 DIAGNOSIS — I2510 Atherosclerotic heart disease of native coronary artery without angina pectoris: Secondary | ICD-10-CM

## 2021-09-30 NOTE — Telephone Encounter (Signed)
Chart supports rx refill Last ov: 02/26/2021 Last refill: 07/05/2021

## 2021-10-07 ENCOUNTER — Other Ambulatory Visit: Payer: Self-pay | Admitting: Internal Medicine

## 2021-10-21 ENCOUNTER — Other Ambulatory Visit: Payer: Self-pay | Admitting: Family Medicine

## 2021-11-15 ENCOUNTER — Encounter: Payer: BC Managed Care – PPO | Admitting: Family Medicine

## 2021-11-26 NOTE — Progress Notes (Signed)
Cardiology Office Note   Date:  11/28/2021   ID:  Aaron Franco, DOB [birth year], MRN [id number]  PCP:  Haydee Salter, MD  Cardiologist:   Minus Breeding, MD   Chief Complaint  Patient presents with   Coronary Artery Disease      History of Present Illness: Aaron Franco is a 51 y.o. male who presents for follow up of CAD.  He has a history of of STEMI 2014 w/ DES CFX.  Cath demonstrated non obstructive disease in 2015.   He had chest pain in 2018 and a negative perfusion study.   We saw him in Feb 2021 he was in the ED with chest pain.  He had a low risk perfusion study.   Since I last saw him he has done okay.  He has not been particularly probably following diet.  He is put on some weight.  He does a lot of walking at work.  He walks stairs.  He does not get chest pressure, neck or arm discomfort.  He does not get palpitations, presyncope or syncope.  He has some mild shortness of breath.  He has had none of the symptoms such as those that he had at the time of his STEMI.   Past Medical History:  Diagnosis Date   CAD (coronary artery disease)    a. 05/2013 NSTEMI/PCI: mCFX 100%-> 3.0 mm  x 20 mm Promus premier DES, EF 65%;  01/2014 Cath: LM nl, LAD 20-30d, LCX patent distal stent, RCA 30-55m EF 55-65%.   Diabetes mellitus    Essential hypertension    GERD (gastroesophageal reflux disease)    Gout    History of echocardiogram    a. 03/2011 Echo: EF 65%, mild LVH, no rwma, mildly dil LA.   Hyperlipidemia    MI, old    Microalbuminuria due to type 2 diabetes mellitus ([place] 12/14/2017   Obesity     Past Surgical History:  Procedure Laterality Date   BICEPS TENDON REPAIR  Nov 13 2010   right   HAND SURGERY  1990   left   LEFT HEART CATH N/A 05/12/2013   Procedure: LEFT HEART CATH;  Surgeon: HSinclair Grooms MD;  [facility];  Service: Cardiovascular;  Laterality: N/A;   LEFT HEART CATHETERIZATION WITH CORONARY ANGIOGRAM N/A 01/24/2014   Procedure: LEFT  HEART CATHETERIZATION WITH CORONARY ANGIOGRAM;  Surgeon: Peter M JMartinique MD;  [facility];  Service: Cardiovascular;  Laterality: N/A;   PERCUTANEOUS CORONARY STENT INTERVENTION (PCI-S)  05/2013   mCx 3.077mx 20 mm Promus P DES   PERCUTANEOUS CORONARY STENT INTERVENTION (PCI-S)  05/12/2013   Procedure: PERCUTANEOUS CORONARY STENT INTERVENTION (PCI-S);  Surgeon: HeSinclair GroomsMD;  [facility];  Service: Cardiovascular;;     Current Outpatient Medications  Medication Sig Dispense Refill   acetaminophen (TYLENOL) 325 MG tablet Take 2 tablets (650 mg total) by mouth every 4 (four) hours as needed for headache or mild pain.     allopurinol (ZYLOPRIM) 100 MG tablet TAKE 1 TABLET(100 MG) BY MOUTH DAILY (Patient taking differently: daily as needed. TAKE 1 TABLET(100 MG) BY MOUTH DAILY) 90 tablet 0   amLODipine (NORVASC) 10 MG tablet Take 1 tablet (10 mg total) by mouth daily. 90 tablet 3   aspirin 81 MG chewable tablet Chew 1 tablet (81 mg total) by mouth daily.     cetirizine (ZYRTEC) 10 MG tablet Take by mouth daily as needed.  dexlansoprazole (DEXILANT) 60 MG capsule TAKE 1 CAPSULE(60 MG) BY MOUTH DAILY 90 capsule 2   FARXIGA 5 MG TABS tablet TAKE 1 TABLET(5 MG) BY MOUTH DAILY BREAKFAST 90 tablet 0   fluticasone (FLONASE) 50 MCG/ACT nasal spray Place 1 spray into both nostrils daily as needed for allergies or rhinitis. 18.2 mL 6   gabapentin (NEURONTIN) 600 MG tablet Take 1 tablet (600 mg total) by mouth 3 (three) times daily. 90 tablet 3   insulin aspart (NOVOLOG FLEXPEN) 100 UNIT/ML FlexPen Inject 10 Units into the skin 3 (three) times daily with meals. 30 mL 2   insulin degludec (TRESIBA FLEXTOUCH) 100 UNIT/ML FlexTouch Pen Inject 20 Units into the skin 2 (two) times daily. 45 mL 1   Insulin Pen Needle 31G X 8 MM MISC 1 Device by Does not apply route in the morning, at noon, in the evening, and at bedtime. 400 each 2   nebivolol (BYSTOLIC) 5 MG tablet TAKE 1 TABLET(5 MG)  BY MOUTH DAILY 90 tablet 0   nitroGLYCERIN (NITROSTAT) 0.4 MG SL tablet Place 1 tablet (0.4 mg total) under the tongue every 5 (five) minutes x 3 doses as needed for chest pain. 25 tablet 2   rosuvastatin (CRESTOR) 40 MG tablet TAKE 1 TABLET(40 MG) BY MOUTH AT BEDTIME 90 tablet 3   Omega-3 Fatty Acids (FISH OIL) 1000 MG CAPS Take 1,000 mg by mouth 2 (two) times a day. (Patient not taking: Reported on 11/28/2021)     vitamin B-12 (CYANOCOBALAMIN) 500 MCG tablet Take 1 tablet (500 mcg total) by mouth daily. (Patient not taking: Reported on 11/28/2021) 90 tablet 1   No current facility-administered medications for this visit.    Allergies:   Influenza vaccines, Influenza virus vaccine h5n1, Fenofibrate, Metformin, and Prednisone    ROS:  Please see the history of present illness.   Otherwise, review of systems are positive for none.   All other systems are reviewed and negative.    PHYSICAL EXAM: VS:  BP 140/88    Pulse 90    Ht 6' (1.829 m)    Wt 280 lb 12.8 oz (127.4 kg)    BMI 38.08 kg/m  , BMI Body mass index is 38.08 kg/m. GENERAL:  Well appearing NECK:  No jugular venous distention, waveform within normal limits, carotid upstroke brisk and symmetric, no bruits, no thyromegaly LUNGS:  Clear to auscultation bilaterally CHEST:  Unremarkable HEART:  PMI not displaced or sustained,S1 and S2 within normal limits, no S3, no S4, no clicks, no rubs, soft brief apical nonradiating systolic, no diastolic murmurs ABD:  Flat, positive bowel sounds normal in frequency in pitch, no bruits, no rebound, no guarding, no midline pulsatile mass, no hepatomegaly, no splenomegaly EXT:  2 plus pulses throughout, no edema, no cyanosis no clubbing   EKG:  EKG is  ordered today. The ekg ordered today demonstrates sinus rhythm, rate 90, axis within normal limits, intervals within normal limits, no acute ST-T wave changes.   Recent Labs: 11/30/2020: ALT 25 12/27/2020: BUN 16; Creatinine, Ser 0.74; Hemoglobin  14.0; Platelets 290; Potassium 4.0; Sodium 133    Lipid Panel    Component Value Date/Time   CHOL 384 (H) 11/30/2020 1233   TRIG 1,040 (HH) 11/30/2020 1233   HDL 32 (L) 11/30/2020 1233   CHOLHDL 12.0 (H) 11/30/2020 1233   CHOLHDL 7 03/13/2020 1636   VLDL UNABLE TO CALCULATE IF TRIGLYCERIDE OVER 400 mg/dL 05/05/2017 0008   LDLCALC Comment (A) 11/30/2020 1233   LDLDIRECT 110.0  03/13/2020 1636      Wt Readings from Last 3 Encounters:  11/28/21 280 lb 12.8 oz (127.4 kg)  02/26/21 284 lb 3.2 oz (128.9 kg)  01/23/21 275 lb 6.4 oz (124.9 kg)      Other studies Reviewed: Additional studies/ records that were reviewed today include: Labs. Review of the above records demonstrates:   See elsewhere   ASSESSMENT AND PLAN:  CAD: He has no new symptoms since his last negative stress test in 2021.  No change in therapy.  Hypertension:   The blood pressure is blood pressure is elevated today but he says it is in the 1 teens over 70s at home.  No change in therapy.    Hyperlipidemia:   LDL was not measured.  His triglycerides were markedly elevated.  Total cholesterol was 384.  This was all January of last year and I am going to repeat this.   I will be checking his lipids along with a direct LDL.  We will check some other labs to include a c-Met, TSH and CBC.  DM2:    A1c was not at target.  His last A1c that I can see was 14.1.  Has missed appointments with his endocrinologist.  We talked at length about diet.  We talked about a Mediterranean plant-based diet.  He needs close follow-up with his endocrinologist.   OBESITY: We had a long discussion about diet.  Current medicines are reviewed at length with the patient today.  The patient does not have concerns regarding medicines.  The following changes have been made:     Labs/ tests ordered today include:     Orders Placed This Encounter  Procedures   Lipid panel   Hemoglobin A1c   Comprehensive metabolic panel   CBC   TSH   LDL  cholesterol, direct   AMB Referral to [facility]   EKG 12-Lead     Disposition:   FU with me in six months.    Signed, Minus Breeding, MD  11/28/2021 10:28 AM    [facility]

## 2021-11-28 ENCOUNTER — Encounter: Payer: Self-pay | Admitting: Cardiology

## 2021-11-28 ENCOUNTER — Other Ambulatory Visit: Payer: Self-pay

## 2021-11-28 ENCOUNTER — Ambulatory Visit (INDEPENDENT_AMBULATORY_CARE_PROVIDER_SITE_OTHER): Payer: BC Managed Care – PPO | Admitting: Cardiology

## 2021-11-28 VITALS — BP 140/88 | HR 90 | Ht 72.0 in | Wt 280.8 lb

## 2021-11-28 DIAGNOSIS — E118 Type 2 diabetes mellitus with unspecified complications: Secondary | ICD-10-CM | POA: Diagnosis not present

## 2021-11-28 DIAGNOSIS — I10 Essential (primary) hypertension: Secondary | ICD-10-CM

## 2021-11-28 DIAGNOSIS — I25119 Atherosclerotic heart disease of native coronary artery with unspecified angina pectoris: Secondary | ICD-10-CM | POA: Diagnosis not present

## 2021-11-28 DIAGNOSIS — E785 Hyperlipidemia, unspecified: Secondary | ICD-10-CM | POA: Diagnosis not present

## 2021-11-28 NOTE — Patient Instructions (Signed)
Medication Instructions:  Your Physician recommend you continue on your current medication as directed.    *If you need a refill on your cardiac medications before your next appointment, please call your pharmacy*   Lab Work: today Lipids, direct ldl, a1c, cmet, cbc, tsh If you have labs (blood work) drawn today and your tests are completely normal, you will receive your results only by: MyChart Message (if you have MyChart) OR A paper copy in the mail If you have any lab test that is abnormal or we need to change your treatment, we will call you to review the results.    Follow-Up: At [facility], you and your health needs are our priority.  As part of our continuing mission to provide you with exceptional heart care, we have created designated Provider Care Teams.  These Care Teams include your primary Cardiologist (physician) and Advanced Practice Providers (APPs -  Physician Assistants and Nurse Practitioners) who all work together to provide you with the care you need, when you need it.  We recommend signing up for the patient portal called "MyChart".  Sign up information is provided on this After Visit Summary.  MyChart is used to connect with patients for Virtual Visits (Telemedicine).  Patients are able to view lab/test results, encounter notes, upcoming appointments, etc.  Non-urgent messages can be sent to your provider as well.   To learn more about what you can do with MyChart, go to [email or web address].    Your next appointment:   6 month(s)  The format for your next appointment:   In Person  Provider:   Rollene Rotunda, MD

## 2021-11-29 LAB — COMPREHENSIVE METABOLIC PANEL
ALT: 22 IU/L (ref 0–44)
AST: 22 IU/L (ref 0–40)
Albumin/Globulin Ratio: 1.5 (ref 1.2–2.2)
Albumin: 4.4 g/dL (ref 3.8–4.9)
Alkaline Phosphatase: 109 IU/L (ref 44–121)
BUN/Creatinine Ratio: 21 — ABNORMAL HIGH (ref 9–20)
BUN: 17 mg/dL (ref 6–24)
Bilirubin Total: <0.2 mg/dL (ref 0.0–1.2)
CO2: 23 mmol/L (ref 20–29)
Calcium: 10.1 mg/dL (ref 8.7–10.2)
Chloride: 99 mmol/L (ref 96–106)
Creatinine, Ser: 0.8 mg/dL (ref 0.76–1.27)
Globulin, Total: 2.9 g/dL (ref 1.5–4.5)
Glucose: 404 mg/dL — ABNORMAL HIGH (ref 70–99)
Potassium: 4.4 mmol/L (ref 3.5–5.2)
Sodium: 137 mmol/L (ref 134–144)
Total Protein: 7.3 g/dL (ref 6.0–8.5)
eGFR: 107 mL/min/{1.73_m2} (ref >59)

## 2021-11-29 LAB — CBC
Hematocrit: 42.4 % (ref 37.5–51.0)
Hemoglobin: 13.9 g/dL (ref 13.0–17.7)
MCH: 26.7 pg (ref 26.6–33.0)
MCHC: 32.8 g/dL (ref 31.5–35.7)
MCV: 81 fL (ref 79–97)
Platelets: 289 10*3/uL (ref 150–450)
RBC: 5.21 x10E6/uL (ref 4.14–5.80)
RDW: 13.7 % (ref 11.6–15.4)
WBC: 6 10*3/uL (ref 3.4–10.8)

## 2021-11-29 LAB — LIPID PANEL
Chol/HDL Ratio: 10.4 ratio — ABNORMAL HIGH (ref 0.0–5.0)
Cholesterol, Total: 352 mg/dL — ABNORMAL HIGH (ref 100–199)
HDL: 34 mg/dL — ABNORMAL LOW (ref >39)
Triglycerides: 1016 mg/dL (ref 0–149)

## 2021-11-29 LAB — LDL CHOLESTEROL, DIRECT: LDL Direct: 151 mg/dL — ABNORMAL HIGH (ref 0–99)

## 2021-11-29 LAB — HEMOGLOBIN A1C
Est. average glucose Bld gHb Est-mCnc: 378 mg/dL
Hgb A1c MFr Bld: 14.8 % — ABNORMAL HIGH (ref 4.8–5.6)

## 2021-11-29 LAB — TSH: TSH: 1.18 u[IU]/mL (ref 0.450–4.500)

## 2021-12-03 ENCOUNTER — Other Ambulatory Visit: Payer: Self-pay | Admitting: *Deleted

## 2021-12-03 DIAGNOSIS — E782 Mixed hyperlipidemia: Secondary | ICD-10-CM

## 2021-12-03 MED ORDER — EZETIMIBE 10 MG PO TABS: 10.0000 mg | ORAL_TABLET | Freq: Every day | ORAL | 3 refills | Status: DC

## 2021-12-26 ENCOUNTER — Encounter: Payer: Self-pay | Admitting: Nurse Practitioner

## 2021-12-26 ENCOUNTER — Other Ambulatory Visit: Payer: Self-pay

## 2021-12-26 ENCOUNTER — Ambulatory Visit (INDEPENDENT_AMBULATORY_CARE_PROVIDER_SITE_OTHER): Payer: BC Managed Care – PPO | Admitting: Nurse Practitioner

## 2021-12-26 VITALS — BP 124/80 | HR 93 | Temp 96.5°F | Wt 277.0 lb

## 2021-12-26 DIAGNOSIS — H6690 Otitis media, unspecified, unspecified ear: Secondary | ICD-10-CM

## 2021-12-26 MED ORDER — AMOXICILLIN-POT CLAVULANATE 875-125 MG PO TABS: 1.0000 | ORAL_TABLET | Freq: Two times a day (BID) | ORAL | 0 refills | Status: DC

## 2021-12-26 NOTE — Progress Notes (Signed)
Acute Office Visit  Subjective:    Patient ID: Aaron Franco, male    DOB: Apr 07, 1971, 51 y.o.   MRN: [id number]  Chief Complaint  Patient presents with   Ear Pain    Pt c/o ear ache in both ears x2 days, pain radiates down jaw.     HPI Patient is in today for bilateral ear pain x2 days.   EAR PAIN  Duration: 2 days Involved ear(s): bilateral Severity:  5/10  Quality: aching Fever: no Otorrhea: no Upper respiratory infection symptoms: yes - runny nose, post nasal drip Pruritus: no Hearing loss: no Water immersion no Using Q-tips: no Recurrent otitis media: no Status: fluctuating Treatments attempted:  dayquil   Past Medical History:  Diagnosis Date   CAD (coronary artery disease)    a. 05/2013 NSTEMI/PCI: mCFX 100%-> 3.0 mm  x 20 mm Promus premier DES, EF 65%;  01/2014 Cath: LM nl, LAD 20-30d, LCX patent distal stent, RCA 30-69m EF 55-65%.   Diabetes mellitus    Essential hypertension    GERD (gastroesophageal reflux disease)    Gout    History of echocardiogram    a. 03/2011 Echo: EF 65%, mild LVH, no rwma, mildly dil LA.   Hyperlipidemia    MI, old    Microalbuminuria due to type 2 diabetes mellitus ([place] 12/14/2017   Obesity     Past Surgical History:  Procedure Laterality Date   BICEPS TENDON REPAIR  Nov 13 2010   right   HAND SURGERY  1990   left   LEFT HEART CATH N/A 05/12/2013   Procedure: LEFT HEART CATH;  Surgeon: HSinclair Grooms MD;  [facility];  Service: Cardiovascular;  Laterality: N/A;   LEFT HEART CATHETERIZATION WITH CORONARY ANGIOGRAM N/A 01/24/2014   Procedure: LEFT HEART CATHETERIZATION WITH CORONARY ANGIOGRAM;  Surgeon: Peter M JMartinique MD;  [facility];  Service: Cardiovascular;  Laterality: N/A;   PERCUTANEOUS CORONARY STENT INTERVENTION (PCI-S)  05/2013   mCx 3.067mx 20 mm Promus P DES   PERCUTANEOUS CORONARY STENT INTERVENTION (PCI-S)  05/12/2013   Procedure: PERCUTANEOUS CORONARY STENT INTERVENTION (PCI-S);   Surgeon: HeSinclair GroomsMD;  [facility];  Service: Cardiovascular;;    Family History  Problem Relation Age of Onset   Heart attack Father        deceased   Diabetes Father    Congestive Heart Failure Father    Stroke Mother    Pancreatic cancer Maternal Aunt    Colon cancer Neg Hx     Social History   Socioeconomic History   Marital status: Married    Spouse name: Not on file   Number of children: 2   Years of education: Not on file   Highest education level: Not on file  Occupational History   Occupation: floor maintenance    Employer: [employer]   Occupation: cook    [employer]er: [employer]   Smoking status: Former    Types: Cigarettes    Quit date: 11/10/1998    Years since quitting: 23.1   Smokeless tobacco: Never   Tobacco comments:    pt only smoked 1-2 cigs a day, not everyday on occs x 2 years  Vaping Use   Vaping Use: Never used  Substance and Sexual Activity   Alcohol use: Yes    Comment: rare   Drug use: No   Sexual activity: Yes  Other Topics Concern   Not  on file  Social History Narrative   Not on file   Social Determinants of Health   Financial Resource Strain: Not on file  Food Insecurity: Not on file  Transportation Needs: Not on file  Physical Activity: Not on file  Stress: Not on file  Social Connections: Not on file  Intimate Partner Violence: Not on file    Outpatient Medications Prior to Visit  Medication Sig Dispense Refill   acetaminophen (TYLENOL) 325 MG tablet Take 2 tablets (650 mg total) by mouth every 4 (four) hours as needed for headache or mild pain.     allopurinol (ZYLOPRIM) 100 MG tablet TAKE 1 TABLET(100 MG) BY MOUTH DAILY (Patient taking differently: daily as needed. TAKE 1 TABLET(100 MG) BY MOUTH DAILY) 90 tablet 0   amLODipine (NORVASC) 10 MG tablet Take 1 tablet (10 mg total) by mouth daily. 90 tablet 3   aspirin 81 MG chewable tablet Chew 1 tablet (81 mg total) by mouth  daily.     cetirizine (ZYRTEC) 10 MG tablet Take by mouth daily as needed.     dexlansoprazole (DEXILANT) 60 MG capsule TAKE 1 CAPSULE(60 MG) BY MOUTH DAILY 90 capsule 2   ezetimibe (ZETIA) 10 MG tablet Take 1 tablet (10 mg total) by mouth daily. 90 tablet 3   FARXIGA 5 MG TABS tablet TAKE 1 TABLET(5 MG) BY MOUTH DAILY BREAKFAST 90 tablet 0   fluticasone (FLONASE) 50 MCG/ACT nasal spray Place 1 spray into both nostrils daily as needed for allergies or rhinitis. 18.2 mL 6   gabapentin (NEURONTIN) 600 MG tablet Take 1 tablet (600 mg total) by mouth 3 (three) times daily. 90 tablet 3   insulin aspart (NOVOLOG FLEXPEN) 100 UNIT/ML FlexPen Inject 10 Units into the skin 3 (three) times daily with meals. 30 mL 2   insulin degludec (TRESIBA FLEXTOUCH) 100 UNIT/ML FlexTouch Pen Inject 20 Units into the skin 2 (two) times daily. 45 mL 1   Insulin Pen Needle 31G X 8 MM MISC 1 Device by Does not apply route in the morning, at noon, in the evening, and at bedtime. 400 each 2   nebivolol (BYSTOLIC) 5 MG tablet TAKE 1 TABLET(5 MG) BY MOUTH DAILY 90 tablet 0   nitroGLYCERIN (NITROSTAT) 0.4 MG SL tablet Place 1 tablet (0.4 mg total) under the tongue every 5 (five) minutes x 3 doses as needed for chest pain. 25 tablet 2   Omega-3 Fatty Acids (FISH OIL) 1000 MG CAPS Take 1,000 mg by mouth 2 (two) times a day. (Patient not taking: Reported on 11/28/2021)     rosuvastatin (CRESTOR) 40 MG tablet TAKE 1 TABLET(40 MG) BY MOUTH AT BEDTIME 90 tablet 3   vitamin B-12 (CYANOCOBALAMIN) 500 MCG tablet Take 1 tablet (500 mcg total) by mouth daily. (Patient not taking: Reported on 11/28/2021) 90 tablet 1   No facility-administered medications prior to visit.    Allergies  Allergen Reactions   Influenza Vaccines Anaphylaxis    Reaction June 2017   Influenza Virus Vaccine H5n1 Anaphylaxis   Fenofibrate Other (See Comments)    Hot flashes   Metformin Other (See Comments)    Chest discomfort   Prednisone Other (See Comments)     Mood swings    Review of Systems See pertinent positives and negatives per HPI.    Objective:    Physical Exam Vitals and nursing note reviewed.  Constitutional:      Appearance: Normal appearance.  HENT:     Head: Normocephalic.  Right Ear: Ear canal and external ear normal. Tympanic membrane is erythematous.     Left Ear: Ear canal and external ear normal. Tympanic membrane is erythematous.  Eyes:     Conjunctiva/sclera: Conjunctivae normal.  Cardiovascular:     Rate and Rhythm: Normal rate and regular rhythm.     Pulses: Normal pulses.     Heart sounds: Normal heart sounds.  Pulmonary:     Effort: Pulmonary effort is normal.     Breath sounds: Normal breath sounds.  Musculoskeletal:     Cervical back: Normal range of motion and neck supple. No tenderness.  Lymphadenopathy:     Cervical: No cervical adenopathy.  Skin:    General: Skin is warm.  Neurological:     General: No focal deficit present.     Mental Status: He is alert and oriented to person, place, and time.  Psychiatric:        Mood and Affect: Mood normal.        Behavior: Behavior normal.        Thought Content: Thought content normal.        Judgment: Judgment normal.    BP 124/80    Pulse 93    Temp (!) 96.5 F (35.8 C) (Temporal)    Wt 277 lb (125.6 kg)    SpO2 96%    BMI 37.57 kg/m  Wt Readings from Last 3 Encounters:  12/26/21 277 lb (125.6 kg)  11/28/21 280 lb 12.8 oz (127.4 kg)  02/26/21 284 lb 3.2 oz (128.9 kg)    Health Maintenance Due  Topic Date Due   COVID-19 Vaccine (1) Never done   OPHTHALMOLOGY EXAM  Never done   Hepatitis C Screening  Never done   COLONOSCOPY (Pts 45-42yr Insurance coverage will need to be confirmed)  Never done   TETANUS/TDAP  09/26/2018   Zoster Vaccines- Shingrix (1 of 2) Never done   URINE MICROALBUMIN  03/15/2021   INFLUENZA VACCINE  Never done    There are no preventive care reminders to display for this patient.   Lab Results  Component  Value Date   TSH 1.180 11/28/2021   Lab Results  Component Value Date   WBC 6.0 11/28/2021   HGB 13.9 11/28/2021   HCT 42.4 11/28/2021   MCV 81 11/28/2021   PLT 289 11/28/2021   Lab Results  Component Value Date   NA 137 11/28/2021   K 4.4 11/28/2021   CO2 23 11/28/2021   GLUCOSE 404 (H) 11/28/2021   BUN 17 11/28/2021   CREATININE 0.80 11/28/2021   BILITOT <0.2 11/28/2021   ALKPHOS 109 11/28/2021   AST 22 11/28/2021   ALT 22 11/28/2021   PROT 7.3 11/28/2021   ALBUMIN 4.4 11/28/2021   CALCIUM 10.1 11/28/2021   ANIONGAP 15 12/27/2020   EGFR 107 11/28/2021   GFR 131.52 03/13/2020   Lab Results  Component Value Date   CHOL 352 (H) 11/28/2021   Lab Results  Component Value Date   HDL 34 (L) 11/28/2021   Lab Results  Component Value Date   LDLCALC Comment (A) 11/28/2021   Lab Results  Component Value Date   TRIG 1,016 ([place] 11/28/2021   Lab Results  Component Value Date   CHOLHDL 10.4 (H) 11/28/2021   Lab Results  Component Value Date   HGBA1C 14.8 (H) 11/28/2021       Assessment & Plan:   Problem List Items Addressed This Visit   None Visit Diagnoses     Acute  otitis media, unspecified otitis media type    -  Primary   Bilateral otitis media. Treat with augmentin BID x7 days. Can take tylenol prn pain. Can use flonase to help with other symptoms. F/U if not improving   Relevant Medications   amoxicillin-clavulanate (AUGMENTIN) 875-125 MG tablet        Meds ordered this encounter  Medications   amoxicillin-clavulanate (AUGMENTIN) 875-125 MG tablet    Sig: Take 1 tablet by mouth 2 (two) times daily.    Dispense:  14 tablet    Refill:  0     Charyl Dancer, NP

## 2021-12-26 NOTE — Patient Instructions (Signed)
It was great to see you!  Start amoxicillin 1 tablet twice a day. You can rinse your mouth out with warm salt water 3x a day.   Let's follow-up if you symptoms don't improve or worsen.   Take care,  Vance Peper, NP

## 2021-12-30 ENCOUNTER — Encounter: Payer: Self-pay | Admitting: Cardiology

## 2021-12-30 ENCOUNTER — Encounter: Payer: Self-pay | Admitting: Family Medicine

## 2021-12-30 NOTE — Telephone Encounter (Signed)
Called patient, he states he is having tooth/jaw pain from an abcess.  Scheduled him an appointment for 01/28/22 @ 2:00 pm.  Dm/cma

## 2021-12-30 NOTE — Telephone Encounter (Signed)
Its scanned into his chart under the MEDIA TAB - it will be under HIM ROI

## 2021-12-31 ENCOUNTER — Other Ambulatory Visit: Payer: Self-pay

## 2021-12-31 ENCOUNTER — Ambulatory Visit (INDEPENDENT_AMBULATORY_CARE_PROVIDER_SITE_OTHER): Payer: BC Managed Care – PPO | Admitting: Family Medicine

## 2021-12-31 ENCOUNTER — Encounter: Payer: Self-pay | Admitting: Family Medicine

## 2021-12-31 VITALS — BP 128/80 | HR 90 | Temp 97.9°F | Ht 72.0 in | Wt 281.0 lb

## 2021-12-31 DIAGNOSIS — K047 Periapical abscess without sinus: Secondary | ICD-10-CM | POA: Diagnosis not present

## 2021-12-31 MED ORDER — CEFUROXIME AXETIL 500 MG PO TABS: 500.0000 mg | ORAL_TABLET | Freq: Two times a day (BID) | ORAL | 0 refills | Status: AC

## 2021-12-31 NOTE — Progress Notes (Signed)
[facility] [address] [place] [address] Dept: [phone] Dept Fax: [phone]  Office Visit  Subjective:    Patient ID: Aaron Franco, male    DOB: Apr 03, 1971, 51 y.o..   MRN: [id number]  Chief Complaint  Patient presents with   Acute Visit    C/o having possible abcess and jaw pain x 1 week.  Has been taking Tylenol with little relief.  Also having a rash with the antibiotics        History of Present Illness:  Patient is in today for concern about an infected tooth. [name] notes that he has tenderness to a right lower molar. This is causing some pain to radiate to the ear and sinuses. He thinks he may have a cracked tooth. He has had dental problems in the past. He had to have an upper left bicuspid pulled at one point in the past. He has not been being seen for routine dental care or cleanings. [name] was seen last week with a right otitis media. He was treated with Augmentin. He notes that after two days, he broke out with a pruritic rash on his arms and trunk. He stopped the medication.  Past Medical History: Patient Active Problem List   Diagnosis Date Noted   Chronic venous insufficiency of lower extremity 01/23/2021   Type 2 diabetes mellitus with hyperglycemia, with long-term current use of insulin (HCC) 12/07/2020   Allergic rhinitis due to pollen 06/29/2020   Diabetic neuropathy (HCC) 03/13/2020   Erectile dysfunction due to arterial insufficiency 03/13/2020   History of gout 11/28/2019   Coronary artery disease involving native heart without angina pectoris 03/29/2019   Onychomycosis 12/01/2017   Morbid obesity with BMI of 40.0-44.9, adult (HCC) 02/18/2016   Chest pain with moderate risk of acute coronary syndrome 09/17/2015   Type 2 diabetes mellitus with diabetic neuropathy, unspecified (HCC) 04/23/2015   Thrush 04/23/2015   Type II diabetes mellitus with renal manifestations (HCC)  02/16/2014   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   Hammer toe, acquired 02/16/2014   CAD S/P percutaneous coronary angioplasty -  01/24/2014   Murmur 02/21/2011   Vitamin B12 deficiency 07/19/2010   Anemia due to vitamin B12 deficiency 10/31/2009   Trigger finger 10/16/2009   Dyslipidemia 03/01/2007   Gout 03/01/2007   Essential hypertension 03/01/2007   Gastroesophageal reflux disease 03/01/2007   Past Surgical History:  Procedure Laterality Date   BICEPS TENDON REPAIR  Nov 13 2010   right   HAND SURGERY  1990   left   LEFT HEART CATH N/A 05/12/2013   Procedure: LEFT HEART CATH;  Surgeon: Lesleigh Noe, MD;  [facility];  Service: Cardiovascular;  Laterality: N/A;   LEFT HEART CATHETERIZATION WITH CORONARY ANGIOGRAM N/A 01/24/2014   Procedure: LEFT HEART CATHETERIZATION WITH CORONARY ANGIOGRAM;  Surgeon: Peter M [place]az[place], MD;  Location[facility] CATH LAB;  Service: Cardiovascular;  Laterality: N/A;   PERCUTANEOUS CORONARY STENT INTERVENTION (PCI-S)  05/2013   mCx 3.54mm x 20 mm Promus P DES   PERCUTANEOUS CORONARY STENT INTERVENTION (PCI-S)  05/12/2013   Procedure: PERCUTANEOUS CORONARY STENT INTERVENTION (PCI-S);  Surgeon: Lesleigh Noe, MD;  Location: [facility] CATH LAB;  Service: Cardiovascular;;   Family History  Problem Relation Age of Onset   Heart attack Father        deceased   Diabetes Father    Congestive Heart Failure Father    Stroke Mother    Pancreatic  cancer Maternal Aunt    Colon cancer Neg Hx    Outpatient Medications Prior to Visit  Medication Sig Dispense Refill   acetaminophen (TYLENOL) 325 MG tablet Take 2 tablets (650 mg total) by mouth every 4 (four) hours as needed for headache or mild pain.     allopurinol (ZYLOPRIM) 100 MG tablet TAKE 1 TABLET(100 MG) BY MOUTH DAILY (Patient taking differently: daily as needed. TAKE 1 TABLET(100 MG) BY MOUTH DAILY) 90 tablet 0   amLODipine (NORVASC) 10 MG tablet Take 1 tablet (10 mg total) by mouth daily. 90 tablet 3    amoxicillin-clavulanate (AUGMENTIN) 875-125 MG tablet Take 1 tablet by mouth 2 (two) times daily. 14 tablet 0   aspirin 81 MG chewable tablet Chew 1 tablet (81 mg total) by mouth daily.     cetirizine (ZYRTEC) 10 MG tablet Take by mouth daily as needed.     dexlansoprazole (DEXILANT) 60 MG capsule TAKE 1 CAPSULE(60 MG) BY MOUTH DAILY 90 capsule 2   ezetimibe (ZETIA) 10 MG tablet Take 1 tablet (10 mg total) by mouth daily. 90 tablet 3   FARXIGA 5 MG TABS tablet TAKE 1 TABLET(5 MG) BY MOUTH DAILY BREAKFAST 90 tablet 0   fluticasone (FLONASE) 50 MCG/ACT nasal spray Place 1 spray into both nostrils daily as needed for allergies or rhinitis. 18.2 mL 6   gabapentin (NEURONTIN) 600 MG tablet Take 1 tablet (600 mg total) by mouth 3 (three) times daily. 90 tablet 3   insulin aspart (NOVOLOG FLEXPEN) 100 UNIT/ML FlexPen Inject 10 Units into the skin 3 (three) times daily with meals. 30 mL 2   insulin degludec (TRESIBA FLEXTOUCH) 100 UNIT/ML FlexTouch Pen Inject 20 Units into the skin 2 (two) times daily. 45 mL 1   Insulin Pen Needle 31G X 8 MM MISC 1 Device by Does not apply route in the morning, at noon, in the evening, and at bedtime. 400 each 2   nebivolol (BYSTOLIC) 5 MG tablet TAKE 1 TABLET(5 MG) BY MOUTH DAILY 90 tablet 0   nitroGLYCERIN (NITROSTAT) 0.4 MG SL tablet Place 1 tablet (0.4 mg total) under the tongue every 5 (five) minutes x 3 doses as needed for chest pain. 25 tablet 2   Omega-3 Fatty Acids (FISH OIL) 1000 MG CAPS Take 1,000 mg by mouth 2 (two) times a day.     rosuvastatin (CRESTOR) 40 MG tablet TAKE 1 TABLET(40 MG) BY MOUTH AT BEDTIME 90 tablet 3   vitamin B-12 (CYANOCOBALAMIN) 500 MCG tablet Take 1 tablet (500 mcg total) by mouth daily. 90 tablet 1   No facility-administered medications prior to visit.   Allergies  Allergen Reactions   Influenza Vaccines Anaphylaxis    Reaction June 2017   Influenza Virus Vaccine H5n1 Anaphylaxis   Fenofibrate Other (See Comments)    Hot  flashes   Metformin Other (See Comments)    Chest discomfort   Prednisone Other (See Comments)    Mood swings    Objective:   Today's Vitals   12/31/21 1405  BP: 128/80  Pulse: 90  Temp: 97.9 F (36.6 C)  TempSrc: Temporal  SpO2: 99%  Weight: 281 lb (127.5 kg)  Height: 6' (1.829 m)   Body mass index is 38.11 kg/m.   General: Well developed, well nourished. No acute distress. HEENT: Normocephalic, non-traumatic. External ears normal. EAC and TMs normal bilaterally. Nose    clear without congestion or rhinorrhea. Mild pain on percussion over the sinuses. Mucous membranes   moist. There is mild  redness around the gum adjacent to his lower posterior molar. No obvious drainage.  Psych: Alert and oriented. Normal mood and affect.  Health Maintenance Due  Topic Date Due   OPHTHALMOLOGY EXAM  Never done   Hepatitis C Screening  Never done   COLONOSCOPY (Pts 45-52yrs Insurance coverage will need to be confirmed)  Never done   TETANUS/TDAP  09/26/2018   Zoster Vaccines- Shingrix (1 of 2) Never done   URINE MICROALBUMIN  03/15/2021     Assessment & Plan:   1. Dental abscess I strongly recommend Mr. Kincer see a dentist for dental x-rays and to consider if he needs to have any further dental procedures related to his cracked tooth. I recommend he use Tylenol 500 mg two tablets every 6 hours for pain. He apparently had an allergic rash due tot he Augmentin. I will try him on a cephalosporin. He is past due for his routine care, so I asked him to follow-up with this.  - cefUROXime (CEFTIN) 500 MG tablet; Take 1 tablet (500 mg total) by mouth 2 (two) times daily with a meal for 10 days.  Dispense: 20 tablet; Refill: 0  Loyola Mast, MD

## 2022-01-02 ENCOUNTER — Telehealth: Payer: Self-pay | Admitting: Cardiology

## 2022-01-02 ENCOUNTER — Encounter: Payer: Self-pay | Admitting: Cardiology

## 2022-01-02 NOTE — Telephone Encounter (Signed)
Returned call to patient and patients wife who states they need the patient's FMLA paperwork updated. Patient states that he needs his FMLA forms to say that he can have episodes or out of work 2-5 days per week, 16-40 hours weekly.  Also they state that patient needs a new letter (the same as the one in chart from Feb 2022) to be sent with a new date as well to patient. Patient and patients wife would like paper work completed and faxed over to Eliezer Champagne [phone], and patients wife states that patient works two jobs and would like a return call to her number listed in chart once completed. Advised patient I would forward to Dr. Antoine Poche and Stanton Kidney, RN for review and advice. Patient and patients wife verbalized understanding.

## 2022-01-02 NOTE — Telephone Encounter (Signed)
Patient calling in regarding fmla paperwork. Please advise

## 2022-01-03 NOTE — Telephone Encounter (Signed)
    Pt is calling back to follow up  

## 2022-01-03 NOTE — Telephone Encounter (Signed)
Returned call to pt/wife she states that there is a new person in HR and they are wanting a clarification. How about 2-5 days per episode. They state that is what they think that it said before/ is this ok or sound right? Please advise

## 2022-01-04 ENCOUNTER — Other Ambulatory Visit: Payer: Self-pay | Admitting: Internal Medicine

## 2022-01-04 ENCOUNTER — Other Ambulatory Visit: Payer: Self-pay | Admitting: Family Medicine

## 2022-01-04 DIAGNOSIS — Z9861 Coronary angioplasty status: Secondary | ICD-10-CM

## 2022-01-04 DIAGNOSIS — I2510 Atherosclerotic heart disease of native coronary artery without angina pectoris: Secondary | ICD-10-CM

## 2022-01-06 ENCOUNTER — Encounter: Payer: Self-pay | Admitting: *Deleted

## 2022-01-06 ENCOUNTER — Encounter: Payer: Self-pay | Admitting: Family Medicine

## 2022-01-06 MED ORDER — DAPAGLIFLOZIN PROPANEDIOL 5 MG PO TABS
ORAL_TABLET | ORAL | 3 refills | Status: DC
Start: 2022-01-06 — End: 2022-09-28

## 2022-01-06 NOTE — Telephone Encounter (Signed)
Refill request for:  Aaron Franco 5 mg LR 10/07/21, #90, 0 RF LOV 12/31/21 FOV  none scheduled.    Please review and advise.  Thanks. Dm/cma

## 2022-01-06 NOTE — Telephone Encounter (Signed)
Please see MyChart encounter.

## 2022-01-07 ENCOUNTER — Telehealth: Payer: Self-pay

## 2022-01-07 NOTE — Telephone Encounter (Signed)
PA for Farxiga 5 MG submitted through cover my meds.  Approved from 12/08/21 - 01/07/23 by [employer]. Pharmacy notified [place] phone. Dm/cma

## 2022-03-18 ENCOUNTER — Ambulatory Visit (INDEPENDENT_AMBULATORY_CARE_PROVIDER_SITE_OTHER): Payer: BC Managed Care – PPO | Admitting: Family Medicine

## 2022-03-18 ENCOUNTER — Encounter: Payer: Self-pay | Admitting: Family Medicine

## 2022-03-18 VITALS — BP 160/90 | HR 89 | Temp 97.5°F | Ht 72.0 in | Wt 284.6 lb

## 2022-03-18 DIAGNOSIS — Z1211 Encounter for screening for malignant neoplasm of colon: Secondary | ICD-10-CM

## 2022-03-18 DIAGNOSIS — Z794 Long term (current) use of insulin: Secondary | ICD-10-CM

## 2022-03-18 DIAGNOSIS — I10 Essential (primary) hypertension: Secondary | ICD-10-CM | POA: Diagnosis not present

## 2022-03-18 DIAGNOSIS — R0600 Dyspnea, unspecified: Secondary | ICD-10-CM | POA: Diagnosis not present

## 2022-03-18 DIAGNOSIS — R011 Cardiac murmur, unspecified: Secondary | ICD-10-CM

## 2022-03-18 DIAGNOSIS — Z125 Encounter for screening for malignant neoplasm of prostate: Secondary | ICD-10-CM | POA: Diagnosis not present

## 2022-03-18 DIAGNOSIS — I2510 Atherosclerotic heart disease of native coronary artery without angina pectoris: Secondary | ICD-10-CM

## 2022-03-18 DIAGNOSIS — E1140 Type 2 diabetes mellitus with diabetic neuropathy, unspecified: Secondary | ICD-10-CM

## 2022-03-18 DIAGNOSIS — K529 Noninfective gastroenteritis and colitis, unspecified: Secondary | ICD-10-CM | POA: Diagnosis not present

## 2022-03-18 DIAGNOSIS — Z9861 Coronary angioplasty status: Secondary | ICD-10-CM | POA: Diagnosis not present

## 2022-03-18 MED ORDER — AMLODIPINE BESYLATE 10 MG PO TABS: 10.0000 mg | ORAL_TABLET | Freq: Every day | ORAL | 3 refills | Status: DC

## 2022-03-18 NOTE — Progress Notes (Signed)
?[facility] ?[facility] ?[place] ?[place] [address] ?Dept: [phone] ?Dept Fax: [phone] ? ?Office Visit ? ?Subjective:  ? ? Patient ID: Aaron Franco, male    DOB: 06/18/1971, 51 y.o..   MRN: [id number] ? ?Chief Complaint  ?Patient presents with  ? Abdominal Pain  ?  Pt states he was experiencing abd pain, nausea and vomiting with diarrhea since 03/15/22. Since then, the diarrhea and nausea has subsided, but still has a cramping feeling in his stomach. ?Would like a covid test to be safe.   ? ? ?History of Present Illness: ? ?Patient is in today for Mr. Taaffe notes that he has had a 3-4 day history of nausea, vomiting, and diarrhea. He states his wife had been sick just prior to his illness with similar symptoms, though hers seemed to clear up more rapidly. He states the y had both attended a recent funeral in [place]. There had been a potluck meal as part of this. He notes his N/V/D are now resolved, but that his stomach continues to feel crampy. He did not have any blood in his stool. He took some Imodium. He also has noted some minor respiratory symptoms, including mild dyspnea. ? ?Mr. Antonucci has a history of CAD s/p angioplasty. He notes his cardiologist has mentioned hearing a small murmur. He also has a history of hypertension, which had been well controlled. He notes he did not take his blood pressure medication today. He admits to some mild swelling of his lower legs. ? ?Mr. Stricker has a history of Type 2 diabetes. He is managed on dapagliflozin, and insulin. He notes his endocrinologist is retiring, so will need someone new to manage his diabetes. ? ?Past Medical History: ?Patient Active Problem List  ? Diagnosis Date Noted  ? Chronic venous insufficiency of lower extremity 01/23/2021  ? Type 2 diabetes mellitus with hyperglycemia, with long-term current use of insulin ([place]) 12/07/2020  ? Allergic rhinitis due to pollen 06/29/2020  ? Diabetic  neuropathy ([place]) 03/13/2020  ? Erectile dysfunction due to arterial insufficiency 03/13/2020  ? History of gout 11/28/2019  ? Coronary artery disease involving native heart without angina pectoris 03/29/2019  ? Onychomycosis 12/01/2017  ? Morbid obesity with BMI of 40.0-44.9, adult ([place]) 02/18/2016  ? Chest pain with moderate risk of acute coronary syndrome 09/17/2015  ? Type 2 diabetes mellitus with diabetic neuropathy, unspecified ([place]) 04/23/2015  ? Thrush 04/23/2015  ? Type II diabetes mellitus with renal manifestations ([place]) 02/16/2014  ? 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 Hammer toe, acquired 02/16/2014  ? CAD S/P percutaneous coronary angioplasty -  01/24/2014  ? Murmur 02/21/2011  ? Vitamin B12 deficiency 07/19/2010  ? Anemia due to vitamin B12 deficiency 10/31/2009  ? Trigger finger 10/16/2009  ? Dyslipidemia 03/01/2007  ? Gout 03/01/2007  ? Essential hypertension 03/01/2007  ? Gastroesophageal reflux disease 03/01/2007  ? ?Past Surgical History:  ?Procedure Laterality Date  ? BICEPS TENDON REPAIR  Nov 13 2010  ? right  ? HAND SURGERY  1990  ? left  ? LEFT HEART CATH N/A 05/12/2013  ? Procedure: LEFT HEART CATH;  Surgeon: Sinclair Grooms, MD;  Location: [facility] CATH LAB;  Service: Cardiovascular;  Laterality: N/A;  ? LEFT HEART CATHETERIZATION WITH CORONARY ANGIOGRAM N/A 01/24/2014  ? Procedure: LEFT HEART CATHETERIZATION WITH CORONARY ANGIOGRAM;  Surgeon: Peter M Martinique, MD;  Location: [facility] CATH LAB;  Service: Cardiovascular;  Laterality: N/A;  ? PERCUTANEOUS CORONARY STENT INTERVENTION (PCI-S)  05/2013  ?  mCx 3.17mm x 20 mm Promus P DES  ? PERCUTANEOUS CORONARY STENT INTERVENTION (PCI-S)  05/12/2013  ? Procedure: PERCUTANEOUS CORONARY STENT INTERVENTION (PCI-S);  Surgeon: Sinclair Grooms, MD;  Location: [facility] CATH LAB;  Service: Cardiovascular;;  ? ?Family History  ?Problem Relation Age of Onset  ? Heart attack Father   ?     deceased  ? Diabetes Father   ? Congestive Heart Failure Father   ? Stroke Mother   ?  Pancreatic cancer Maternal Aunt   ? Colon cancer Neg Hx   ? ?Outpatient Medications Prior to Visit  ?Medication Sig Dispense Refill  ? acetaminophen (TYLENOL) 325 MG tablet Take 2 tablets (650 mg total) by mouth every 4 (four) hours as needed for headache or mild pain.    ? allopurinol (ZYLOPRIM) 100 MG tablet TAKE 1 TABLET(100 MG) BY MOUTH DAILY (Patient taking differently: daily as needed. TAKE 1 TABLET(100 MG) BY MOUTH DAILY) 90 tablet 0  ? amLODipine (NORVASC) 10 MG tablet Take 1 tablet (10 mg total) by mouth daily. 90 tablet 3  ? amoxicillin-clavulanate (AUGMENTIN) 875-125 MG tablet Take 1 tablet by mouth 2 (two) times daily. 14 tablet 0  ? aspirin 81 MG chewable tablet Chew 1 tablet (81 mg total) by mouth daily.    ? cetirizine (ZYRTEC) 10 MG tablet Take by mouth daily as needed.    ? dapagliflozin propanediol (FARXIGA) 5 MG TABS tablet TAKE 1 TABLET(5 MG) BY MOUTH DAILY BREAKFAST 90 tablet 3  ? dexlansoprazole (DEXILANT) 60 MG capsule TAKE 1 CAPSULE(60 MG) BY MOUTH DAILY 90 capsule 2  ? fluticasone (FLONASE) 50 MCG/ACT nasal spray Place 1 spray into both nostrils daily as needed for allergies or rhinitis. 18.2 mL 6  ? gabapentin (NEURONTIN) 600 MG tablet Take 1 tablet (600 mg total) by mouth 3 (three) times daily. 90 tablet 3  ? insulin aspart (NOVOLOG FLEXPEN) 100 UNIT/ML FlexPen Inject 10 Units into the skin 3 (three) times daily with meals. 30 mL 2  ? insulin degludec (TRESIBA FLEXTOUCH) 100 UNIT/ML FlexTouch Pen Inject 20 Units into the skin 2 (two) times daily. 45 mL 1  ? Insulin Pen Needle 31G X 8 MM MISC 1 Device by Does not apply route in the morning, at noon, in the evening, and at bedtime. 400 each 2  ? nebivolol (BYSTOLIC) 5 MG tablet TAKE 1 TABLET(5 MG) BY MOUTH DAILY 90 tablet 0  ? nitroGLYCERIN (NITROSTAT) 0.4 MG SL tablet Place 1 tablet (0.4 mg total) under the tongue every 5 (five) minutes x 3 doses as needed for chest pain. 25 tablet 2  ? Omega-3 Fatty Acids (FISH OIL) 1000 MG CAPS Take 1,000  mg by mouth 2 (two) times a day.    ? rosuvastatin (CRESTOR) 40 MG tablet TAKE 1 TABLET(40 MG) BY MOUTH AT BEDTIME 90 tablet 3  ? vitamin B-12 (CYANOCOBALAMIN) 500 MCG tablet Take 1 tablet (500 mcg total) by mouth daily. 90 tablet 1  ? ezetimibe (ZETIA) 10 MG tablet Take 1 tablet (10 mg total) by mouth daily. 90 tablet 3  ? ?No facility-administered medications prior to visit.  ? ?Allergies  ?Allergen Reactions  ? Influenza Vaccines Anaphylaxis  ?  Reaction June 2017  ? Influenza Virus Vaccine H5n1 Anaphylaxis  ? Fenofibrate Other (See Comments)  ?  Hot flashes  ? Metformin Other (See Comments)  ?  Chest discomfort  ? Prednisone Other (See Comments)  ?  Mood swings  ? Augmentin [Amoxicillin-Pot Clavulanate] Rash  ?  ?  Objective:  ? ?Today's Vitals  ? 03/18/22 1518  ?BP: (!) 160/90  ?Pulse: 89  ?Temp: (!) 97.5 ?F (36.4 ?C)  ?TempSrc: Temporal  ?SpO2: 99%  ?Weight: 284 lb 9.6 oz (129.1 kg)  ?Height: 6' (1.829 m)  ? ?Body mass index is 38.6 kg/m?.  ? ?General: Well developed, well nourished. No acute distress. ?HEENT: Normocephalic, non-traumatic. Conjunctiva clear. External ears normal. EAC and TMs  ? normal bilaterally. Nose clear without congestion or rhinorrhea. Mucous membranes moist.  ? Oropharynx clear. Good dentition. ?Neck: Supple. No lymphadenopathy. No thyromegaly. ?Lungs: Clear to auscultation bilaterally. No wheezing, rales or rhonchi. ?CV: RRR with a III/VI decrescendo systolic murmurs. Pulses 2+ bilaterally. ?Abdomen: Soft. Mild LLQ tenderness without guarding. Bowel sounds positive, normal pitch and  ? moderately increased frequency. No hepatosplenomegaly. ?Extremities: 1+ edema of both lower legs. ?Psych: Alert and oriented. Normal mood and affect. ? ?Health Maintenance Due  ?Topic Date Due  ? OPHTHALMOLOGY EXAM  Never done  ? Hepatitis C Screening  Never done  ? COLONOSCOPY (Pts 45-108yrs Insurance coverage will need to be confirmed)  Never done  ? URINE MICROALBUMIN  03/15/2021  ? FOOT EXAM  01/23/2022   ? ?Lab Results: ?POCT COVID- Neg.   ?Assessment & Plan:  ? ?1. Gastroenteritis ?The GI symptoms seem consistent with a viral gastroenteritis. Mr. Mcfail could continue some Imodium as needed. I recomme

## 2022-03-19 LAB — PSA: PSA: 0.43 ng/mL (ref 0.10–4.00)

## 2022-03-19 LAB — BRAIN NATRIURETIC PEPTIDE: Pro B Natriuretic peptide (BNP): 28 pg/mL (ref 0.0–100.0)

## 2022-03-20 ENCOUNTER — Encounter: Payer: Self-pay | Admitting: Family Medicine

## 2022-03-26 ENCOUNTER — Encounter: Payer: Self-pay | Admitting: Family Medicine

## 2022-04-18 ENCOUNTER — Encounter: Payer: Self-pay | Admitting: Family Medicine

## 2022-04-24 ENCOUNTER — Ambulatory Visit: Payer: BC Managed Care – PPO | Admitting: Cardiology

## 2022-05-02 ENCOUNTER — Telehealth (INDEPENDENT_AMBULATORY_CARE_PROVIDER_SITE_OTHER): Payer: BC Managed Care – PPO | Admitting: Internal Medicine

## 2022-05-02 ENCOUNTER — Telehealth: Payer: Self-pay

## 2022-05-02 ENCOUNTER — Encounter: Payer: Self-pay | Admitting: Internal Medicine

## 2022-05-02 VITALS — BP 117/80 | HR 69 | Ht 72.0 in | Wt 278.0 lb

## 2022-05-02 DIAGNOSIS — E7849 Other hyperlipidemia: Secondary | ICD-10-CM | POA: Diagnosis not present

## 2022-05-02 DIAGNOSIS — I10 Essential (primary) hypertension: Secondary | ICD-10-CM

## 2022-05-02 DIAGNOSIS — I25119 Atherosclerotic heart disease of native coronary artery with unspecified angina pectoris: Secondary | ICD-10-CM | POA: Diagnosis not present

## 2022-05-02 DIAGNOSIS — E119 Type 2 diabetes mellitus without complications: Secondary | ICD-10-CM

## 2022-05-02 DIAGNOSIS — E781 Pure hyperglyceridemia: Secondary | ICD-10-CM | POA: Diagnosis not present

## 2022-05-02 MED ORDER — REPATHA SURECLICK 140 MG/ML ~~LOC~~ SOAJ: 140.0000 mL | SUBCUTANEOUS | 2 refills | Status: DC

## 2022-05-05 ENCOUNTER — Telehealth: Payer: Self-pay | Admitting: Internal Medicine

## 2022-05-05 NOTE — Telephone Encounter (Signed)
PA for Repatha was submitted via CMM on 05/02/22  BCBS-FEP denied coverage  Message received: This is to inform you that we have received the medical information that your office has submitted for the above member's Repatha 140mg /ml request and at this time, the information submitted did not meet the plan's criteria for medical necessity due to the following reason: the use of this medication without a LDL-C level greater than or equal to 70 mg per deciliter within past 6 months does not establish medical necessity for this drug. Medical necessity is determined by adherence to generally accepted standards of medical practice in the [place], is clinically appropriate, in terms of type, frequency, extent, site, duration and considered effective for the patient's illness, injury, disease, or its symptoms. For more information, please refer to the [employer] brochure (RI 71-005 or RI 71-017). Details regarding medical necessity are listed in section 10.  The member's reconsideration letter is needed to move forward with the appeal process. Please have the member fax the reconsideration request to [phone] or mail it the following address: Service Benefit Plan [address] [address] [place], [place] [address] Attention: Prior Approval Clinical Service

## 2022-05-07 ENCOUNTER — Telehealth: Payer: Self-pay | Admitting: Cardiology

## 2022-05-07 NOTE — Telephone Encounter (Signed)
PA pending as of 05/06/22

## 2022-05-07 NOTE — Telephone Encounter (Signed)
Pt c/o medication issue:  1. Name of Medication:   Evolocumab (REPATHA SURECLICK) 140 MG/ML SOAJ   2. How are you currently taking this medication (dosage and times per day)?  Inject 140 mLs into the skin every 14 (fourteen) days 3. Are you having a reaction (difficulty breathing--STAT)? No  4. What is your medication issue? Calling to check on Prior Authorization. Please advise

## 2022-05-08 NOTE — Telephone Encounter (Signed)
Carissa from [employer] on an update about status of PA. Informed her that Herb Grays, RN is out of the office today 6/29. She states she will call back tomorrow.

## 2022-05-09 NOTE — Telephone Encounter (Signed)
Attempted to return call to [facility] x2 and unable to get thru   PA is pending

## 2022-05-12 NOTE — Telephone Encounter (Signed)
No fax received from [facility] FEP Called to find out what clinical info was needed Notified that I should fax documentation showing adherence to high-intensity statin therapy OR intolerance to such along with lab work from last 6 months  This has been faxed to [facility] FEP at [phone]

## 2022-05-14 ENCOUNTER — Encounter: Payer: Self-pay | Admitting: Cardiology

## 2022-05-15 NOTE — Telephone Encounter (Signed)
Member reconsideration letter needs to be submitted with appeal  Message sent to patient w/example letter to be signed/returned

## 2022-05-20 NOTE — Telephone Encounter (Signed)
Spoke with patient. Notified him that a member reconsideration letter needs to be signed by him to be submitted with an appeal. He said he will "look into it". Explained all the details were sent in a MyChart message along with everything he needs to put in the letter. Cannot file appeal/complete process without this, per requirements from [facility] FEP

## 2022-06-05 ENCOUNTER — Telehealth: Payer: Self-pay

## 2022-06-05 ENCOUNTER — Telehealth: Payer: Self-pay | Admitting: Family Medicine

## 2022-06-05 ENCOUNTER — Encounter: Payer: Self-pay | Admitting: Family Medicine

## 2022-06-05 ENCOUNTER — Other Ambulatory Visit: Payer: Self-pay | Admitting: Family Medicine

## 2022-06-05 ENCOUNTER — Other Ambulatory Visit: Payer: Self-pay | Admitting: Internal Medicine

## 2022-06-05 DIAGNOSIS — E1140 Type 2 diabetes mellitus with diabetic neuropathy, unspecified: Secondary | ICD-10-CM

## 2022-06-05 DIAGNOSIS — I2510 Atherosclerotic heart disease of native coronary artery without angina pectoris: Secondary | ICD-10-CM

## 2022-06-05 MED ORDER — TRESIBA FLEXTOUCH 100 UNIT/ML ~~LOC~~ SOPN: 20.0000 [IU] | PEN_INJECTOR | Freq: Two times a day (BID) | SUBCUTANEOUS | 1 refills | Status: DC

## 2022-06-05 MED ORDER — NEBIVOLOL HCL 5 MG PO TABS: ORAL_TABLET | ORAL | 0 refills | Status: DC

## 2022-06-05 NOTE — Telephone Encounter (Signed)
Can you please and thank you check on referral that was placed for Endocrinology back in May by Dr Veto Kemps. Patient hasn't hear anything.   Thanks   Dm/cma

## 2022-06-05 NOTE — Telephone Encounter (Signed)
Caller Name: Yoniel Arkwright Call back phone #: [phone]  Reason for Call: Pt was returning a call to [facility]

## 2022-06-05 NOTE — Telephone Encounter (Signed)
He will send the letter next week for the insurance.

## 2022-06-06 ENCOUNTER — Telehealth: Payer: Self-pay | Admitting: Family Medicine

## 2022-06-06 DIAGNOSIS — E1140 Type 2 diabetes mellitus with diabetic neuropathy, unspecified: Secondary | ICD-10-CM

## 2022-06-06 MED ORDER — TRESIBA FLEXTOUCH 100 UNIT/ML ~~LOC~~ SOPN: 20.0000 [IU] | PEN_INJECTOR | Freq: Two times a day (BID) | SUBCUTANEOUS | 1 refills | Status: DC

## 2022-06-06 NOTE — Telephone Encounter (Signed)
Caller Name: [id number], [place] - [address] AT [facility] [address] Call back phone #: [phone] Fax#[phone]  Reason for Call:  [facility] called, a pharamacy tech accidentally deleted a prescription sent from [place] yesterday for this patient. Asked that we resend

## 2022-06-06 NOTE — Telephone Encounter (Signed)
Spoke t pharmacy Tech and the [place] resent to the pharmacy. Dm/cma

## 2022-06-06 NOTE — Telephone Encounter (Signed)
I spoke to patient and sent him the phone number to [facility] through my chart due to he was at work.  He will let us know if anything further is needed.   Thank you for looking into it for me.  Dm/cma

## 2022-07-05 ENCOUNTER — Encounter: Payer: Self-pay | Admitting: Family Medicine

## 2022-07-05 ENCOUNTER — Other Ambulatory Visit: Payer: Self-pay | Admitting: Family Medicine

## 2022-07-05 DIAGNOSIS — K219 Gastro-esophageal reflux disease without esophagitis: Secondary | ICD-10-CM

## 2022-07-07 ENCOUNTER — Telehealth: Payer: Self-pay

## 2022-07-07 DIAGNOSIS — K219 Gastro-esophageal reflux disease without esophagitis: Secondary | ICD-10-CM

## 2022-07-07 MED ORDER — DEXLANSOPRAZOLE 60 MG PO CPDR: DELAYED_RELEASE_CAPSULE | ORAL | 2 refills | Status: DC

## 2022-07-07 NOTE — Telephone Encounter (Signed)
PA for Delansoprazole submitted through cover my meds awaiting response.  Dm/cma   Key: [id number]

## 2022-07-08 NOTE — Telephone Encounter (Signed)
PA for Dexlansoprazole 60mg  caps denied by patient's insurance.  Formulary alternatives are: esomepraole delayed-release, Lansoprazole delayed released caps, omeprazole delayed release, pantoprazole delayed release tabs.       Please review and advise.   Thanks. Dm/cma

## 2022-07-10 ENCOUNTER — Other Ambulatory Visit: Payer: Self-pay | Admitting: Family Medicine

## 2022-07-10 DIAGNOSIS — K219 Gastro-esophageal reflux disease without esophagitis: Secondary | ICD-10-CM

## 2022-07-10 MED ORDER — OMEPRAZOLE 40 MG PO CPDR: 40.0000 mg | DELAYED_RELEASE_CAPSULE | Freq: Every day | ORAL | 0 refills | Status: DC

## 2022-07-10 NOTE — Addendum Note (Signed)
Addended by: Andrez Grime on: 07/10/2022 07:59 AM   Modules accepted: Orders

## 2022-07-16 NOTE — Telephone Encounter (Signed)
Patient has not yet responded with member letter  [facility] FEP phone #: [phone]

## 2022-07-17 ENCOUNTER — Encounter: Payer: Self-pay | Admitting: Family Medicine

## 2022-07-17 ENCOUNTER — Ambulatory Visit (INDEPENDENT_AMBULATORY_CARE_PROVIDER_SITE_OTHER): Payer: BC Managed Care – PPO | Admitting: Family Medicine

## 2022-07-17 ENCOUNTER — Encounter (HOSPITAL_BASED_OUTPATIENT_CLINIC_OR_DEPARTMENT_OTHER): Payer: Self-pay | Admitting: Internal Medicine

## 2022-07-17 VITALS — BP 138/86 | HR 77 | Temp 97.8°F | Wt 283.8 lb

## 2022-07-17 DIAGNOSIS — R600 Localized edema: Secondary | ICD-10-CM | POA: Diagnosis not present

## 2022-07-17 DIAGNOSIS — M1A09X0 Idiopathic chronic gout, multiple sites, without tophus (tophi): Secondary | ICD-10-CM | POA: Diagnosis not present

## 2022-07-17 LAB — BRAIN NATRIURETIC PEPTIDE: Pro B Natriuretic peptide (BNP): 34 pg/mL (ref 0.0–100.0)

## 2022-07-17 MED ORDER — COLCHICINE 0.6 MG PO TABS: ORAL_TABLET | ORAL | 0 refills | Status: DC

## 2022-07-17 NOTE — Assessment & Plan Note (Signed)
Weight is close to dry weight, actually down since last visit No associated dyspnea or chest pain Patient with a history of lower extremity edema, causes likely multifactorial including medication amlodipine and gabapentin plus venous insufficiency and reflux, given history of intermittent compliance on amlodipine and currently endorsing amlodipine compliance since last visit 03/2022, edema could be associated with side effect of CCB due to regularly taking amlodipine  Nevertheless, given abrupt change over the past few days and unilaterally worsening on the left, cannot rule out DVT, ordering stat venous Doppler Although no dyspnea or weight gain, vital signs stable, patient history of HFpEF with grade 1 diastolic dysfunction, also has has multiple risk factors for cardiac disease including prior history of CAD with stenting, grossly uncontrolled diabetes, hypertension, obesity, will get BNP to assess for worsening heart failure Patient does have a history of gout, does endorse that he has had bilateral, there is mild pain, it seems that a trial of colchicine for empiric treatment of gout safe and may confirm this is the etiology, especially if other tests are negative Patient is upcoming follow-up with cardiology in a few weeks, encourage patient to keep that appointment Return and ED precautions discussed especially patient develops chest pain or shortness of breath

## 2022-07-17 NOTE — Progress Notes (Signed)
Assessment/Plan:   Problem List Items Addressed This Visit       Other   Gout   Relevant Medications   colchicine 0.6 MG tablet   Bilateral lower extremity edema - Primary    Weight is close to dry weight, actually down since last visit No associated dyspnea or chest pain Patient with a history of lower extremity edema, causes likely multifactorial including medication amlodipine and gabapentin plus venous insufficiency and reflux, given history of intermittent compliance on amlodipine and currently endorsing amlodipine compliance since last visit 03/2022, edema could be associated with side effect of CCB due to regularly taking amlodipine  Nevertheless, given abrupt change over the past few days and unilaterally worsening on the left, cannot rule out DVT, ordering stat venous Doppler Although no dyspnea or weight gain, vital signs stable, patient history of HFpEF with grade 1 diastolic dysfunction, also has has multiple risk factors for cardiac disease including prior history of CAD with stenting, grossly uncontrolled diabetes, hypertension, obesity, will get BNP to assess for worsening heart failure Patient does have a history of gout, does endorse that he has had bilateral, there is mild pain, it seems that a trial of colchicine for empiric treatment of gout safe and may confirm this is the etiology, especially if other tests are negative Patient is upcoming follow-up with cardiology in a few weeks, encourage patient to keep that appointment Return and ED precautions discussed especially patient develops chest pain or shortness of breath      Relevant Orders   VAS [place] LOWER EXTREMITY VENOUS (DVT)   B Nat Peptide       Subjective:  HPI:  Aaron Franco is a 51 y.o. male who has Vitamin B12 deficiency; Dyslipidemia; Gout; Anemia due to vitamin B12 deficiency; Essential hypertension; Gastroesophageal reflux disease; Bilateral lower extremity edema; Murmur; CAD S/P percutaneous  coronary angioplasty - ; Type 2 diabetes mellitus with diabetic neuropathy, unspecified (HCC); Thrush; Chest pain with moderate risk of acute coronary syndrome; Coronary artery disease involving native heart without angina pectoris; History of gout; Diabetic neuropathy (HCC); Erectile dysfunction due to arterial insufficiency; Allergic rhinitis due to pollen; Type 2 diabetes mellitus with hyperglycemia, with long-term current use of insulin (HCC); Type II diabetes mellitus with renal manifestations (HCC); Onychomycosis; Old myocardial infarction; Morbid obesity with BMI of 40.0-44.9, adult (HCC); Hammer toe, acquired; Trigger finger; and Chronic venous insufficiency of lower extremity on their problem list..   He  has a past medical history of CAD (coronary artery disease), Diabetes mellitus, Essential hypertension, GERD (gastroesophageal reflux disease), Gout, History of echocardiogram, Hyperlipidemia, MI, old, Microalbuminuria due to type 2 diabetes mellitus (HCC) (12/14/2017), and Obesity.Marland Kitchen   He presents with chief complaint of Foot Swelling (Swelling in feet and lower legs x Saturday. ) .   Edema: Patient complains of edema. The location of the edema is lower leg(s) bilateral, left foot worse than right.  The edema has been moderate.  Onset of symptoms was 5 days ago, unchanged since that time.  It was sudden onset.  The edema is present all day. The patient states the he has had a problem with edema that was intermittent for several years.  The swelling has been aggravated by  nothing , relieved by support stockings, and been associated with use of calcium channel blockers and venous insufficiency. Cardiac risk factors include advanced age (older than 14 for men, 56 for women), diabetes mellitus, dyslipidemia, hypertension, male gender, obesity (BMI >= 30 kg/m2), and sedentary lifestyle.  Patient does report some pain in the left leg.  He says that he thought he might have had gout, but was not as painful  as previous episodes.  He does endorse a history of getting bilateral gout flares in the ankles.  He has taken indomethacin and allopurinol for this in the past.  He has taken colchicine as well.  They have all helped with flares in the past.   Patient has a history of chronic bilateral edema.  Likely multifactorial associated with medication side effect including amlodipine and gabapentin, and some venous stasis disease.  He has worn compression stockings for this.  Patient did have venous Dopplers of the lower right extremities that did show venous reflux in multiple vessels.  Patient weight is down 1 pound since 03/2022.  Patient denies chest pain, abdominal pain, headache, visual changes, or shortness of breath at this time.  Patient did have some complaint of edema and dyspnea on 05/23.  Patient was evaluated by his PCP Dr. Veto Kemps.  At that time patient had had some weight gain.  Dr. Veto Kemps had concern for possible heart failure,  BNP obtained that day, however, was normal.  Of note, patient did have elevated hypertension, there seem to be some intermittent compliance on amlodipine.  Patient was encouraged to restart the medication. Patient takes amlodipine 10 mg.  He says he has been taking it regularly since his last visit with Dr. Veto Kemps.  Patient is followed with cardiology.  He last saw them on 04/2022, although that was a virtual visit.  Patient did not complain of dyspnea or shortness of breath.  Patient has had multiple cardiac imaging studies including echocardiogram in 2018 that showed HFpEF EF of 65% with LVH and grade 1 diastolic dysfunction.  Patient Lexiscan in 2021 that showed preserved EF of 55% and overall low risk scan.  Past Surgical History:  Procedure Laterality Date   BICEPS TENDON REPAIR  Nov 13 2010   right   HAND SURGERY  1990   left   LEFT HEART CATH N/A 05/12/2013   Procedure: LEFT HEART CATH;  Surgeon: Lesleigh Noe, MD;  Location[facility] CATH LAB;  Service: Cardiovascular;   Laterality: N/A;   LEFT HEART CATHETERIZATION WITH CORONARY ANGIOGRAM N/A 01/24/2014   Procedure: LEFT HEART CATHETERIZATION WITH CORONARY ANGIOGRAM;  Surgeon: Peter M Swaziland, MD;  Location: [facility] CATH LAB;  Service: Cardiovascular;  Laterality: N/A;   PERCUTANEOUS CORONARY STENT INTERVENTION (PCI-S)  05/2013   mCx 3.34mm x 20 mm Promus P DES   PERCUTANEOUS CORONARY STENT INTERVENTION (PCI-S)  05/12/2013   Procedure: PERCUTANEOUS CORONARY STENT INTERVENTION (PCI-S);  Surgeon: Lesleigh Noe, MD;  Location: [facility] CATH LAB;  Service: Cardiovascular;;    Outpatient Medications Prior to Visit  Medication Sig Dispense Refill   acetaminophen (TYLENOL) 325 MG tablet Take 2 tablets (650 mg total) by mouth every 4 (four) hours as needed for headache or mild pain.     allopurinol (ZYLOPRIM) 100 MG tablet TAKE 1 TABLET(100 MG) BY MOUTH DAILY (Patient taking differently: daily as needed. TAKE 1 TABLET(100 MG) BY MOUTH DAILY) 90 tablet 0   amLODipine (NORVASC) 10 MG tablet Take 1 tablet (10 mg total) by mouth daily. 90 tablet 3   aspirin 81 MG chewable tablet Chew 1 tablet (81 mg total) by mouth daily.     dapagliflozin propanediol (FARXIGA) 5 MG TABS tablet TAKE 1 TABLET(5 MG) BY MOUTH DAILY BREAKFAST 90 tablet 3   dexlansoprazole (DEXILANT) 60 MG capsule  TAKE 1 CAPSULE(60 MG) BY MOUTH DAILY 90 capsule 2   Evolocumab (REPATHA SURECLICK) 140 MG/ML SOAJ Inject 140 mLs into the skin every 14 (fourteen) days. 6 mL 2   fluticasone (FLONASE) 50 MCG/ACT nasal spray Place 1 spray into both nostrils daily as needed for allergies or rhinitis. 18.2 mL 6   gabapentin (NEURONTIN) 600 MG tablet Take 1 tablet (600 mg total) by mouth 3 (three) times daily. (Patient taking differently: Take 600 mg by mouth 3 (three) times daily as needed.) 90 tablet 3   insulin degludec (TRESIBA FLEXTOUCH) 100 UNIT/ML FlexTouch Pen Inject 20 Units into the skin 2 (two) times daily. 45 mL 1   Insulin Pen Needle 31G X 8 MM MISC 1 Device by Does not  apply route in the morning, at noon, in the evening, and at bedtime. 400 each 2   nebivolol (BYSTOLIC) 5 MG tablet TAKE 1 TABLET(5 MG) BY MOUTH DAILY 90 tablet 0   nitroGLYCERIN (NITROSTAT) 0.4 MG SL tablet Place 1 tablet (0.4 mg total) under the tongue every 5 (five) minutes x 3 doses as needed for chest pain. 25 tablet 2   Omega-3 Fatty Acids (FISH OIL) 1000 MG CAPS Take 1,000 mg by mouth 2 (two) times a day.     omeprazole (PRILOSEC) 40 MG capsule TAKE 1 CAPSULE(40 MG) BY MOUTH DAILY 90 capsule 1   rosuvastatin (CRESTOR) 40 MG tablet TAKE 1 TABLET(40 MG) BY MOUTH AT BEDTIME 90 tablet 3   cetirizine (ZYRTEC) 10 MG tablet Take by mouth daily as needed. (Patient not taking: Reported on 07/17/2022)     vitamin B-12 (CYANOCOBALAMIN) 500 MCG tablet Take 1 tablet (500 mcg total) by mouth daily. (Patient not taking: Reported on 07/17/2022) 90 tablet 1   No facility-administered medications prior to visit.    Family History  Problem Relation Age of Onset   Heart attack Father        deceased   Diabetes Father    Congestive Heart Failure Father    Stroke Mother    Pancreatic cancer Maternal Aunt    Colon cancer Neg Hx     Social History   Socioeconomic History   Marital status: Married    Spouse name: Not on file   Number of children: 2   Years of education: Not on file   Highest education level: Not on file  Occupational History   Occupation: floor maintenance    Employer: [employer]   Occupation: cook    Employer: [employer]  Tobacco Use   Smoking status: Former    Types: Cigarettes    Quit date: 11/10/1998    Years since quitting: 23.6   Smokeless tobacco: Never   Tobacco comments:    pt only smoked 1-2 cigs a day, not everyday on occs x 2 years  Vaping Use   Vaping Use: Never used  Substance and Sexual Activity   Alcohol use: Yes    Comment: rare   Drug use: No   Sexual activity: Yes  Other Topics Concern   Not on file  Social History Narrative   Not on  file   Social Determinants of Health   Financial Resource Strain: Not on file  Food Insecurity: Not on file  Transportation Needs: Not on file  Physical Activity: Not on file  Stress: Not on file  Social Connections: Not on file  Intimate Partner Violence: Not on file  Objective:  Physical Exam: BP 138/86 (BP Location: Left Arm, Patient Position: Sitting, Cuff Size: Large)   Pulse 77   Temp 97.8 F (36.6 C) (Temporal)   Wt 283 lb 12.8 oz (128.7 kg)   SpO2 98%   BMI 38.49 kg/m    General: No acute distress. Awake and conversant.  Eyes: Normal conjunctiva, anicteric. Round symmetric pupils.  ENT: Hearing grossly intact. No nasal discharge.  Neck: Neck is supple. No masses or thyromegaly.  CTA B Respiratory: Respirations are non-labored. No auditory wheezing.  Skin: Warm.  No overlying rash, erythema bilateral lower extremities Psych: Alert and oriented. Cooperative, Appropriate mood and affect, Normal judgment.  CV: No cyanosis or JVD, RRR, systolic murmur appreciated MSK: Normal ambulation.  Bilateral lower extremity edema to mid shin, left 2-3+ pitting, right 1+ pitting, there is mild tenderness in the left ankle, ankle is euthermic Neuro: Sensation and CN II-XII grossly normal.        Garner Nash, MD, MS

## 2022-07-17 NOTE — Telephone Encounter (Signed)
Received member letter via MyChart message - faxed to [facility] FEP at [phone]

## 2022-07-17 NOTE — Patient Instructions (Signed)
WE are ordering ultrasound of legs, the office will call for appointment We are checking lab work to ensure heart is not in heart failure For possible gout, try colchicine.  Go to ED, if severe chest pain or shortness of breath

## 2022-07-18 ENCOUNTER — Telehealth: Payer: Self-pay

## 2022-07-18 ENCOUNTER — Ambulatory Visit (HOSPITAL_COMMUNITY)
Admission: RE | Admit: 2022-07-18 | Discharge: 2022-07-18 | Disposition: A | Discharge status: Home / Self Care | Payer: BC Managed Care – PPO | Source: Ambulatory Visit | Attending: Family Medicine | Admitting: Family Medicine

## 2022-07-18 DIAGNOSIS — R600 Localized edema: Secondary | ICD-10-CM | POA: Diagnosis not present

## 2022-07-18 NOTE — Progress Notes (Signed)
Lower extremity venous bilateral study completed.  Preliminary results relayed to Patsy Lager for Janee Morn, MD.   See CV Proc for preliminary results report.   Jean Rosenthal, RDMS, RVT

## 2022-07-18 NOTE — Telephone Encounter (Signed)
Fleet Contras from [facility] imaging called in to advise that patient does not show blood clots from LE VENOUS (DVT) , but he does have swollen lymph nodes and they recommended patient follow up at office with PCP to rule out infection.

## 2022-07-18 NOTE — Telephone Encounter (Signed)
Patient scheduled on 07/23/22 at 3pm with Dr. Veto Kemps. Patient is requesting a work note due to the pain in his feet and swelling. I advised him that would be discussed during the visit.

## 2022-07-21 NOTE — Telephone Encounter (Signed)
A user error has taken place: encounter opened in error, closed for administrative reasons.

## 2022-07-23 ENCOUNTER — Other Ambulatory Visit: Payer: Self-pay

## 2022-07-23 ENCOUNTER — Ambulatory Visit (INDEPENDENT_AMBULATORY_CARE_PROVIDER_SITE_OTHER): Payer: BC Managed Care – PPO | Admitting: Family Medicine

## 2022-07-23 ENCOUNTER — Encounter: Payer: Self-pay | Admitting: Family Medicine

## 2022-07-23 ENCOUNTER — Inpatient Hospital Stay (HOSPITAL_COMMUNITY)
Admission: EM | Admit: 2022-07-23 | Discharge: 2022-07-28 | DRG: 854 | Disposition: A | Discharge status: Home Health | Payer: BC Managed Care – PPO | Attending: Internal Medicine | Admitting: Internal Medicine

## 2022-07-23 ENCOUNTER — Ambulatory Visit (INDEPENDENT_AMBULATORY_CARE_PROVIDER_SITE_OTHER): Payer: BC Managed Care – PPO

## 2022-07-23 ENCOUNTER — Emergency Department (HOSPITAL_COMMUNITY): Payer: BC Managed Care – PPO

## 2022-07-23 ENCOUNTER — Encounter (HOSPITAL_COMMUNITY): Payer: Self-pay

## 2022-07-23 VITALS — BP 126/68 | HR 81 | Temp 97.8°F | Ht 72.0 in | Wt 284.0 lb

## 2022-07-23 DIAGNOSIS — Z794 Long term (current) use of insulin: Secondary | ICD-10-CM

## 2022-07-23 DIAGNOSIS — M109 Gout, unspecified: Secondary | ICD-10-CM | POA: Diagnosis present

## 2022-07-23 DIAGNOSIS — E1142 Type 2 diabetes mellitus with diabetic polyneuropathy: Secondary | ICD-10-CM | POA: Diagnosis not present

## 2022-07-23 DIAGNOSIS — I252 Old myocardial infarction: Secondary | ICD-10-CM

## 2022-07-23 DIAGNOSIS — E11622 Type 2 diabetes mellitus with other skin ulcer: Secondary | ICD-10-CM | POA: Diagnosis not present

## 2022-07-23 DIAGNOSIS — I110 Hypertensive heart disease with heart failure: Secondary | ICD-10-CM | POA: Diagnosis present

## 2022-07-23 DIAGNOSIS — B957 Other staphylococcus as the cause of diseases classified elsewhere: Secondary | ICD-10-CM | POA: Diagnosis present

## 2022-07-23 DIAGNOSIS — E11628 Type 2 diabetes mellitus with other skin complications: Secondary | ICD-10-CM

## 2022-07-23 DIAGNOSIS — R600 Localized edema: Secondary | ICD-10-CM | POA: Diagnosis not present

## 2022-07-23 DIAGNOSIS — L089 Local infection of the skin and subcutaneous tissue, unspecified: Secondary | ICD-10-CM | POA: Diagnosis not present

## 2022-07-23 DIAGNOSIS — M869 Osteomyelitis, unspecified: Secondary | ICD-10-CM | POA: Diagnosis present

## 2022-07-23 DIAGNOSIS — Z87891 Personal history of nicotine dependence: Secondary | ICD-10-CM | POA: Diagnosis not present

## 2022-07-23 DIAGNOSIS — Z833 Family history of diabetes mellitus: Secondary | ICD-10-CM

## 2022-07-23 DIAGNOSIS — A419 Sepsis, unspecified organism: Principal | ICD-10-CM | POA: Diagnosis present

## 2022-07-23 DIAGNOSIS — Z20822 Contact with and (suspected) exposure to covid-19: Secondary | ICD-10-CM | POA: Diagnosis present

## 2022-07-23 DIAGNOSIS — I2510 Atherosclerotic heart disease of native coronary artery without angina pectoris: Secondary | ICD-10-CM | POA: Diagnosis not present

## 2022-07-23 DIAGNOSIS — L02612 Cutaneous abscess of left foot: Secondary | ICD-10-CM | POA: Diagnosis present

## 2022-07-23 DIAGNOSIS — Z8739 Personal history of other diseases of the musculoskeletal system and connective tissue: Secondary | ICD-10-CM | POA: Diagnosis not present

## 2022-07-23 DIAGNOSIS — I5032 Chronic diastolic (congestive) heart failure: Secondary | ICD-10-CM | POA: Diagnosis not present

## 2022-07-23 DIAGNOSIS — D649 Anemia, unspecified: Secondary | ICD-10-CM | POA: Diagnosis present

## 2022-07-23 DIAGNOSIS — L02619 Cutaneous abscess of unspecified foot: Secondary | ICD-10-CM | POA: Diagnosis present

## 2022-07-23 DIAGNOSIS — K219 Gastro-esophageal reflux disease without esophagitis: Secondary | ICD-10-CM | POA: Diagnosis present

## 2022-07-23 DIAGNOSIS — L0291 Cutaneous abscess, unspecified: Secondary | ICD-10-CM | POA: Diagnosis not present

## 2022-07-23 DIAGNOSIS — L03116 Cellulitis of left lower limb: Secondary | ICD-10-CM | POA: Diagnosis present

## 2022-07-23 DIAGNOSIS — E1165 Type 2 diabetes mellitus with hyperglycemia: Secondary | ICD-10-CM

## 2022-07-23 DIAGNOSIS — L03119 Cellulitis of unspecified part of limb: Secondary | ICD-10-CM | POA: Diagnosis not present

## 2022-07-23 DIAGNOSIS — I10 Essential (primary) hypertension: Secondary | ICD-10-CM | POA: Diagnosis not present

## 2022-07-23 DIAGNOSIS — E1140 Type 2 diabetes mellitus with diabetic neuropathy, unspecified: Secondary | ICD-10-CM

## 2022-07-23 DIAGNOSIS — E1169 Type 2 diabetes mellitus with other specified complication: Secondary | ICD-10-CM | POA: Diagnosis not present

## 2022-07-23 DIAGNOSIS — E669 Obesity, unspecified: Secondary | ICD-10-CM | POA: Diagnosis not present

## 2022-07-23 DIAGNOSIS — Z880 Allergy status to penicillin: Secondary | ICD-10-CM

## 2022-07-23 DIAGNOSIS — Z8249 Family history of ischemic heart disease and other diseases of the circulatory system: Secondary | ICD-10-CM

## 2022-07-23 DIAGNOSIS — E11621 Type 2 diabetes mellitus with foot ulcer: Secondary | ICD-10-CM | POA: Diagnosis present

## 2022-07-23 DIAGNOSIS — Z6838 Body mass index (BMI) 38.0-38.9, adult: Secondary | ICD-10-CM

## 2022-07-23 DIAGNOSIS — Z7982 Long term (current) use of aspirin: Secondary | ICD-10-CM

## 2022-07-23 DIAGNOSIS — B952 Enterococcus as the cause of diseases classified elsewhere: Secondary | ICD-10-CM | POA: Diagnosis present

## 2022-07-23 DIAGNOSIS — D638 Anemia in other chronic diseases classified elsewhere: Secondary | ICD-10-CM | POA: Diagnosis not present

## 2022-07-23 DIAGNOSIS — Z955 Presence of coronary angioplasty implant and graft: Secondary | ICD-10-CM

## 2022-07-23 DIAGNOSIS — M25475 Effusion, left foot: Secondary | ICD-10-CM | POA: Diagnosis not present

## 2022-07-23 DIAGNOSIS — Z79899 Other long term (current) drug therapy: Secondary | ICD-10-CM

## 2022-07-23 DIAGNOSIS — R011 Cardiac murmur, unspecified: Secondary | ICD-10-CM | POA: Diagnosis present

## 2022-07-23 DIAGNOSIS — L97529 Non-pressure chronic ulcer of other part of left foot with unspecified severity: Secondary | ICD-10-CM | POA: Diagnosis not present

## 2022-07-23 DIAGNOSIS — E1152 Type 2 diabetes mellitus with diabetic peripheral angiopathy with gangrene: Secondary | ICD-10-CM | POA: Diagnosis present

## 2022-07-23 DIAGNOSIS — Z823 Family history of stroke: Secondary | ICD-10-CM

## 2022-07-23 DIAGNOSIS — E785 Hyperlipidemia, unspecified: Secondary | ICD-10-CM | POA: Diagnosis present

## 2022-07-23 DIAGNOSIS — M86172 Other acute osteomyelitis, left ankle and foot: Secondary | ICD-10-CM | POA: Diagnosis not present

## 2022-07-23 DIAGNOSIS — E7800 Pure hypercholesterolemia, unspecified: Secondary | ICD-10-CM | POA: Diagnosis present

## 2022-07-23 DIAGNOSIS — Z888 Allergy status to other drugs, medicaments and biological substances status: Secondary | ICD-10-CM

## 2022-07-23 DIAGNOSIS — Z9861 Coronary angioplasty status: Secondary | ICD-10-CM

## 2022-07-23 LAB — CBC WITH DIFFERENTIAL/PLATELET
Abs Immature Granulocytes: 0.11 10*3/uL — ABNORMAL HIGH (ref 0.00–0.07)
Basophils Absolute: 0.1 10*3/uL (ref 0.0–0.1)
Basophils Relative: 0 %
Eosinophils Absolute: 0.1 10*3/uL (ref 0.0–0.5)
Eosinophils Relative: 0 %
HCT: 41.2 % (ref 39.0–52.0)
Hemoglobin: 12.7 g/dL — ABNORMAL LOW (ref 13.0–17.0)
Immature Granulocytes: 1 %
Lymphocytes Relative: 11 %
Lymphs Abs: 1.6 10*3/uL (ref 0.7–4.0)
MCH: 25.9 pg — ABNORMAL LOW (ref 26.0–34.0)
MCHC: 30.8 g/dL (ref 30.0–36.0)
MCV: 83.9 fL (ref 80.0–100.0)
Monocytes Absolute: 1.3 10*3/uL — ABNORMAL HIGH (ref 0.1–1.0)
Monocytes Relative: 9 %
Neutro Abs: 11.4 10*3/uL — ABNORMAL HIGH (ref 1.7–7.7)
Neutrophils Relative %: 79 %
Platelets: 360 10*3/uL (ref 150–400)
RBC: 4.91 MIL/uL (ref 4.22–5.81)
RDW: 13.4 % (ref 11.5–15.5)
WBC: 14.6 10*3/uL — ABNORMAL HIGH (ref 4.0–10.5)
nRBC: 0 % (ref 0.0–0.2)

## 2022-07-23 LAB — URINALYSIS, ROUTINE W REFLEX MICROSCOPIC
Bilirubin Urine: NEGATIVE
Glucose, UA: >=500 mg/dL — AB
Hgb urine dipstick: NEGATIVE
Ketones, ur: 80 mg/dL — AB
Leukocytes,Ua: NEGATIVE
Nitrite: NEGATIVE
Protein, ur: 30 mg/dL — AB
Specific Gravity, Urine: 1.031 — ABNORMAL HIGH (ref 1.005–1.030)
pH: 6 (ref 5.0–8.0)

## 2022-07-23 LAB — CBG MONITORING, ED: Glucose-Capillary: 153 mg/dL — ABNORMAL HIGH (ref 70–99)

## 2022-07-23 LAB — COMPREHENSIVE METABOLIC PANEL
ALT: 14 U/L (ref 0–44)
AST: 14 U/L — ABNORMAL LOW (ref 15–41)
Albumin: 3.1 g/dL — ABNORMAL LOW (ref 3.5–5.0)
Alkaline Phosphatase: 91 U/L (ref 38–126)
Anion gap: 12 (ref 5–15)
BUN: 12 mg/dL (ref 6–20)
CO2: 24 mmol/L (ref 22–32)
Calcium: 9.6 mg/dL (ref 8.9–10.3)
Chloride: 102 mmol/L (ref 98–111)
Creatinine, Ser: 0.9 mg/dL (ref 0.61–1.24)
GFR, Estimated: >60 mL/min (ref >60)
Glucose, Bld: 239 mg/dL — ABNORMAL HIGH (ref 70–99)
Potassium: 4.1 mmol/L (ref 3.5–5.1)
Sodium: 138 mmol/L (ref 135–145)
Total Bilirubin: 0.7 mg/dL (ref 0.3–1.2)
Total Protein: 8.7 g/dL — ABNORMAL HIGH (ref 6.5–8.1)

## 2022-07-23 LAB — RESP PANEL BY RT-PCR (FLU A&B, COVID) ARPGX2
Influenza A by PCR: NEGATIVE
Influenza B by PCR: NEGATIVE
SARS Coronavirus 2 by RT PCR: NEGATIVE

## 2022-07-23 LAB — LACTIC ACID, PLASMA
Lactic Acid, Venous: 1.1 mmol/L (ref 0.5–1.9)
Lactic Acid, Venous: 1.4 mmol/L (ref 0.5–1.9)

## 2022-07-23 LAB — PROTIME-INR
INR: 1.1 (ref 0.8–1.2)
Prothrombin Time: 13.6 seconds (ref 11.4–15.2)

## 2022-07-23 LAB — SEDIMENTATION RATE: Sed Rate: 99 mm/hr — ABNORMAL HIGH (ref 0–16)

## 2022-07-23 LAB — HEMOGLOBIN A1C
Hgb A1c MFr Bld: 13.4 % — ABNORMAL HIGH (ref 4.8–5.6)
Mean Plasma Glucose: 337.88 mg/dL

## 2022-07-23 LAB — APTT: aPTT: 31 seconds (ref 24–36)

## 2022-07-23 MED ORDER — ACETAMINOPHEN 325 MG PO TABS
650.0000 mg | ORAL_TABLET | Freq: Four times a day (QID) | ORAL | Status: DC | PRN
  Administered 2022-07-24: 650 mg via ORAL
  Filled 2022-07-23: qty 2

## 2022-07-23 MED ORDER — AMLODIPINE BESYLATE 10 MG PO TABS
10.0000 mg | ORAL_TABLET | Freq: Every day | ORAL | Status: DC
  Administered 2022-07-24 – 2022-07-25 (×2): 5 mg via ORAL
  Administered 2022-07-26 – 2022-07-28 (×3): 10 mg via ORAL
  Filled 2022-07-23 (×5): qty 1

## 2022-07-23 MED ORDER — INSULIN ASPART 100 UNIT/ML IJ SOLN
0.0000 [IU] | Freq: Three times a day (TID) | INTRAMUSCULAR | Status: DC
  Administered 2022-07-24: 5 [IU] via SUBCUTANEOUS
  Administered 2022-07-24: 3 [IU] via SUBCUTANEOUS
  Administered 2022-07-24: 5 [IU] via SUBCUTANEOUS
  Administered 2022-07-25 – 2022-07-26 (×3): 3 [IU] via SUBCUTANEOUS
  Administered 2022-07-26: 8 [IU] via SUBCUTANEOUS
  Administered 2022-07-26: 5 [IU] via SUBCUTANEOUS
  Administered 2022-07-27: 8 [IU] via SUBCUTANEOUS
  Administered 2022-07-27: 5 [IU] via SUBCUTANEOUS
  Administered 2022-07-27 – 2022-07-28 (×4): 3 [IU] via SUBCUTANEOUS

## 2022-07-23 MED ORDER — METRONIDAZOLE 500 MG/100ML IV SOLN
500.0000 mg | Freq: Two times a day (BID) | INTRAVENOUS | Status: DC
  Administered 2022-07-24: 500 mg via INTRAVENOUS
  Filled 2022-07-23: qty 100

## 2022-07-23 MED ORDER — ALBUTEROL SULFATE (2.5 MG/3ML) 0.083% IN NEBU: 2.5000 mg | INHALATION_SOLUTION | Freq: Four times a day (QID) | RESPIRATORY_TRACT | Status: DC | PRN

## 2022-07-23 MED ORDER — LACTATED RINGERS IV BOLUS (SEPSIS)
1000.0000 mL | Freq: Once | INTRAVENOUS | Status: AC
  Administered 2022-07-23: 1000 mL via INTRAVENOUS

## 2022-07-23 MED ORDER — SODIUM CHLORIDE 0.9% FLUSH
3.0000 mL | Freq: Two times a day (BID) | INTRAVENOUS | Status: DC
  Administered 2022-07-24 – 2022-07-28 (×6): 3 mL via INTRAVENOUS

## 2022-07-23 MED ORDER — SODIUM CHLORIDE 0.9 % IV SOLN
2.0000 g | Freq: Once | INTRAVENOUS | Status: AC
  Administered 2022-07-23: 2 g via INTRAVENOUS
  Filled 2022-07-23: qty 12.5

## 2022-07-23 MED ORDER — ONDANSETRON HCL 4 MG/2ML IJ SOLN: 4.0000 mg | Freq: Four times a day (QID) | INTRAMUSCULAR | Status: DC | PRN

## 2022-07-23 MED ORDER — LACTATED RINGERS IV SOLN: INTRAVENOUS | Status: AC

## 2022-07-23 MED ORDER — METRONIDAZOLE 500 MG/100ML IV SOLN
500.0000 mg | Freq: Once | INTRAVENOUS | Status: AC
  Administered 2022-07-23: 500 mg via INTRAVENOUS
  Filled 2022-07-23: qty 100

## 2022-07-23 MED ORDER — HYDROCODONE-ACETAMINOPHEN 5-325 MG PO TABS
1.0000 | ORAL_TABLET | Freq: Four times a day (QID) | ORAL | Status: DC | PRN
  Filled 2022-07-23: qty 1

## 2022-07-23 MED ORDER — PANTOPRAZOLE SODIUM 40 MG PO TBEC
40.0000 mg | DELAYED_RELEASE_TABLET | Freq: Every day | ORAL | Status: DC
  Administered 2022-07-24 – 2022-07-25 (×2): 40 mg via ORAL
  Filled 2022-07-23 (×3): qty 1

## 2022-07-23 MED ORDER — SODIUM CHLORIDE 0.9 % IV SOLN
2.0000 g | Freq: Three times a day (TID) | INTRAVENOUS | Status: DC
  Administered 2022-07-24 – 2022-07-27 (×11): 2 g via INTRAVENOUS
  Filled 2022-07-23 (×12): qty 12.5

## 2022-07-23 MED ORDER — HYDRALAZINE HCL 20 MG/ML IJ SOLN: 10.0000 mg | INTRAMUSCULAR | Status: DC | PRN

## 2022-07-23 MED ORDER — ROSUVASTATIN CALCIUM 20 MG PO TABS
40.0000 mg | ORAL_TABLET | Freq: Every day | ORAL | Status: DC
  Administered 2022-07-23 – 2022-07-27 (×5): 40 mg via ORAL
  Filled 2022-07-23 (×5): qty 2

## 2022-07-23 MED ORDER — ACETAMINOPHEN 650 MG RE SUPP: 650.0000 mg | Freq: Four times a day (QID) | RECTAL | Status: DC | PRN

## 2022-07-23 MED ORDER — DAPAGLIFLOZIN PROPANEDIOL 5 MG PO TABS
5.0000 mg | ORAL_TABLET | Freq: Every day | ORAL | Status: DC
  Administered 2022-07-24 – 2022-07-28 (×4): 5 mg via ORAL
  Filled 2022-07-23 (×6): qty 1

## 2022-07-23 MED ORDER — ACETAMINOPHEN 325 MG PO TABS
650.0000 mg | ORAL_TABLET | Freq: Once | ORAL | Status: AC
  Administered 2022-07-23: 650 mg via ORAL
  Filled 2022-07-23: qty 2

## 2022-07-23 MED ORDER — ASPIRIN 81 MG PO CHEW
81.0000 mg | CHEWABLE_TABLET | Freq: Every day | ORAL | Status: DC
  Administered 2022-07-24 – 2022-07-28 (×4): 81 mg via ORAL
  Filled 2022-07-23 (×4): qty 1

## 2022-07-23 MED ORDER — ONDANSETRON HCL 4 MG PO TABS: 4.0000 mg | ORAL_TABLET | Freq: Four times a day (QID) | ORAL | Status: DC | PRN

## 2022-07-23 MED ORDER — NEBIVOLOL HCL 10 MG PO TABS
5.0000 mg | ORAL_TABLET | Freq: Every day | ORAL | Status: DC
  Administered 2022-07-23 – 2022-07-27 (×5): 5 mg via ORAL
  Filled 2022-07-23 (×6): qty 1

## 2022-07-23 MED ORDER — INSULIN ASPART 100 UNIT/ML IJ SOLN
0.0000 [IU] | Freq: Every day | INTRAMUSCULAR | Status: DC
  Administered 2022-07-24 – 2022-07-26 (×3): 2 [IU] via SUBCUTANEOUS

## 2022-07-23 NOTE — ED Triage Notes (Signed)
Patient has diabetic ulcer to left foot.  Patient sent for rule out osteo and sepsis

## 2022-07-23 NOTE — ED Notes (Signed)
Patient transported to MRI 

## 2022-07-23 NOTE — Patient Instructions (Signed)
Recommend patient go to [facility] ER for evaluation. Report called to ED Triage nurse.

## 2022-07-23 NOTE — H&P (Signed)
History and Physical    Patient: Aaron Franco FOY:[id number] DOB: [birth year] DOA: 07/23/2022 DOS: the patient was seen and examined on 07/23/2022 PCP: Haydee Salter, MD  Patient coming from: Home  Chief Complaint:  Chief Complaint  Patient presents with   Diabetic Ulcer   HPI: Aaron Franco is a 51 y.o. male with medical history significant of  HTN, HLD, CAD s/p PCI, HfpEF, diabetes mellitus type 2, GERD, and obesity who presents with complaints of left foot infection.  Patient reports increased swelling for about a week and a half in the foot.  Denies any injury to the foot to his knowledge.  However, started to develop redness over the dorsal aspect of the foot over the last week.  At home patient had been having intermittent fever, chills, and frontal headache.  He had been seen by his primary care provider on 9/7 due to the swelling and had been sent for DVT study on 9/H which was negative.  He has had intermittent cough that he thinks is secondary to blood pressure medication.  When he took off his sock today and noted that the skin was sloughing off on the lateral aspect of his foot which was new.  Denies seeing any drainage from the foot.  He had a follow-up appointment with his primary care provider and they sent him to the hospital at the concern for infection.  In the emergency department patient was seen to be febrile up to 101.3 F with labs significant for WBC 14.6 and lactic acid within normal limits x2. MRI of the left foot had been obtained with preliminary read noting fluid collection of the lateral foot about the fourth and fifth metatarsal heads, marrow edema in the fourth and fifth metatarsal heads with indeterminate reactive change versus early osteomyelitis, and joint effusions at the fourth and fifth MTP joints concerning for septic arthritis.  Patient was given 1 L lactated Ringer's, acetaminophen 650 mg p.o., cefepime 2 g IV, and metronidazole 500 mg IV.  Review of  Systems: As mentioned in the history of present illness. All other systems reviewed and are negative. Past Medical History:  Diagnosis Date   CAD (coronary artery disease)    a. 05/2013 NSTEMI/PCI: mCFX 100%-> 3.0 mm  x 20 mm Promus premier DES, EF 65%;  01/2014 Cath: LM nl, LAD 20-30d, LCX patent distal stent, RCA 30-79m EF 55-65%.   Diabetes mellitus    Essential hypertension    GERD (gastroesophageal reflux disease)    Gout    History of echocardiogram    a. 03/2011 Echo: EF 65%, mild LVH, no rwma, mildly dil LA.   Hyperlipidemia    MI, old    Microalbuminuria due to type 2 diabetes mellitus ([place] 12/14/2017   Obesity    Past Surgical History:  Procedure Laterality Date   BICEPS TENDON REPAIR  Nov 13 2010   right   HAND SURGERY  1990   left   LEFT HEART CATH N/A 05/12/2013   Procedure: LEFT HEART CATH;  Surgeon: HSinclair Grooms MD;  [facility];  Service: Cardiovascular;  Laterality: N/A;   LEFT HEART CATHETERIZATION WITH CORONARY ANGIOGRAM N/A 01/24/2014   Procedure: LEFT HEART CATHETERIZATION WITH CORONARY ANGIOGRAM;  Surgeon: Peter M JMartinique MD;  [facility];  Service: Cardiovascular;  Laterality: N/A;   PERCUTANEOUS CORONARY STENT INTERVENTION (PCI-S)  05/2013   mCx 3.035mx 20 mm Promus P DES   PERCUTANEOUS CORONARY STENT INTERVENTION (PCI-S)  05/12/2013  Procedure: PERCUTANEOUS CORONARY STENT INTERVENTION (PCI-S);  Surgeon: Sinclair Grooms, MD;  Location: [facility] CATH LAB;  Service: Cardiovascular;;   Social History:  reports that he quit smoking about 23 years ago. His smoking use included cigarettes. He has never used smokeless tobacco. He reports current alcohol use. He reports that he does not use drugs.  Allergies  Allergen Reactions   Influenza Vaccines Anaphylaxis    Reaction June 2017   Influenza Virus Vaccine H5n1 Anaphylaxis   Fenofibrate Other (See Comments)    Hot flashes   Metformin Other (See Comments)    Chest discomfort   Prednisone Other  (See Comments)    Mood swings   Augmentin [Amoxicillin-Pot Clavulanate] Rash    Family History  Problem Relation Age of Onset   Heart attack Father        deceased   Diabetes Father    Congestive Heart Failure Father    Stroke Mother    Pancreatic cancer Maternal Aunt    Colon cancer Neg Hx     Prior to Admission medications   Medication Sig Start Date End Date Taking? Authorizing Provider  acetaminophen (TYLENOL) 325 MG tablet Take 2 tablets (650 mg total) by mouth every 4 (four) hours as needed for headache or mild pain. 05/06/17   Erlene Quan, PA-C  allopurinol (ZYLOPRIM) 100 MG tablet TAKE 1 TABLET(100 MG) BY MOUTH DAILY Patient taking differently: daily as needed. TAKE 1 TABLET(100 MG) BY MOUTH DAILY 12/21/20   Libby Maw, MD  amLODipine (NORVASC) 10 MG tablet Take 1 tablet (10 mg total) by mouth daily. 03/18/22   Haydee Salter, MD  aspirin 81 MG chewable tablet Chew 1 tablet (81 mg total) by mouth daily. 05/14/13   Richardson Dopp T, PA-C  cetirizine (ZYRTEC) 10 MG tablet Take by mouth daily as needed. 05/27/20   [provider]  colchicine 0.6 MG tablet Take 2 pills then take 1 pill 1 hour later 07/17/22   Bonnita Hollow, MD  dapagliflozin propanediol (FARXIGA) 5 MG TABS tablet TAKE 1 TABLET(5 MG) BY MOUTH DAILY BREAKFAST 01/06/22   Haydee Salter, MD  dexlansoprazole (DEXILANT) 60 MG capsule TAKE 1 CAPSULE(60 MG) BY MOUTH DAILY 07/07/22   Haydee Salter, MD  Evolocumab (REPATHA SURECLICK) 976 MG/ML SOAJ Inject 140 mLs into the skin every 14 (fourteen) days. 05/02/22   Hilty, Nadean Corwin, MD  fluticasone (FLONASE) 50 MCG/ACT nasal spray Place 1 spray into both nostrils daily as needed for allergies or rhinitis. 06/29/20 10/13/24  Libby Maw, MD  gabapentin (NEURONTIN) 600 MG tablet Take 1 tablet (600 mg total) by mouth 3 (three) times daily. Patient taking differently: Take 600 mg by mouth 3 (three) times daily as needed. 01/23/21   Haydee Salter, MD   insulin degludec (TRESIBA FLEXTOUCH) 100 UNIT/ML FlexTouch Pen Inject 20 Units into the skin 2 (two) times daily. 06/06/22   Haydee Salter, MD  Insulin Pen Needle 31G X 8 MM MISC 1 Device by Does not apply route in the morning, at noon, in the evening, and at bedtime. 01/04/21   Shamleffer, Melanie Crazier, MD  nebivolol (BYSTOLIC) 5 MG tablet TAKE 1 TABLET(5 MG) BY MOUTH DAILY 06/05/22   Haydee Salter, MD  nitroGLYCERIN (NITROSTAT) 0.4 MG SL tablet Place 1 tablet (0.4 mg total) under the tongue every 5 (five) minutes x 3 doses as needed for chest pain. 12/21/20   Libby Maw, MD  Omega-3 Fatty Acids (FISH OIL) 1000  MG CAPS Take 1,000 mg by mouth 2 (two) times a day.    [provider]  omeprazole (PRILOSEC) 40 MG capsule TAKE 1 CAPSULE(40 MG) BY MOUTH DAILY 07/10/22   Libby Maw, MD  rosuvastatin (CRESTOR) 40 MG tablet TAKE 1 TABLET(40 MG) BY MOUTH AT BEDTIME 10/21/21   Haydee Salter, MD  vitamin B-12 (CYANOCOBALAMIN) 500 MCG tablet Take 1 tablet (500 mcg total) by mouth daily. 03/20/20   Libby Maw, MD    Physical Exam: Vitals:   07/23/22 1749 07/23/22 1815 07/23/22 1930 07/23/22 2015  BP: (!) 156/80 (!) 160/84 (!) 171/92 139/75  Pulse: 96 99 100 96  Resp: 18 17 (!) 22 13  Temp: (!) 101.2 F (38.4 C)     TempSrc: Oral     SpO2: 100% 100% 99% 98%  Weight:      Height:        Constitutional: Middle-aged male currently in no acute distress Eyes: PERRL, lids and conjunctivae normal ENMT: Mucous membranes are moist.   Neck: normal, supple,  Respiratory: clear to auscultation bilaterally, no wheezing, no crackles.   Cardiovascular: Regular rate and rhythm with 2/6 systolic murmur appreciated.   2+ pitting lower extremity edema. Musculoskeletal: no clubbing / cyanosis. No joint deformity upper and lower extremities. Good ROM, no contractures. Normal muscle tone.                          Skin: Redness and erythema noted on the dorsal aspect of  the left foot with skin desquamation on the lateral foot.  No drainage appreciated.   Neurologic: CN 2-12 grossly intact. Strength 5/5 in all 4.  Psychiatric: Normal judgment and insight. Alert and oriented x 3. Normal mood.   Data Reviewed:  EKG reveals sinus rhythm at 98 bpm.  Reviewed labs, imaging, and pertinent records as noted above in HPI  Assessment and Plan:  Sepsis secondary to cellulitis and abscess/ Patient presents with complaints of swelling of the left foot with redness, pain, and fevers up to 101.3 F here in the ED. Labs significant for WBC 14.6.  MRI preliminary read gave concern for fluid collection with marrow edema of the fourth and fifth metatarsal heads with indeterminate for reactive change or early osteomyelitis and joint effusions of the MTP joints concerning for septic arthritis.  Blood cultures have been ordered and patient was started on cefepime and metronidazole. -Admit to medical telemetry bed -Follow-up blood cultures -Lower extremity wound order set utilized -Add-on ESR and CRP -Continue empiric antibiotics of cefepime and metronidazole  -Hydrocodone as need for pain -Tylenol as needed for fever -Recheck CBC tomorrow -Dr. Mable Fill orthopedics consulted he will notify Silvestre Gunner, PA in a.m. with recommendations to keep patient n.p.o. after midnight  Murmur Acute.  Systolic murmur present on physical exam that gives concern for aortic stenosis.  Last echocardiogram in 2018, did note any significant abnormalities. -Check echocardiogram  Uncontrolled diabetes mellitus type 2, with hyperglycemia On admission glucose 239.  Last available hemoglobin A1c 14.8 in 11/2021.  Home medication regimen includes Farxiga 5 mg daily and Tresiba 20 mg every 12 hours.  Patient is followed by endocrinology in outpatient setting, but admits that he has slacking on his management of his diabetes -Hypoglycemic protocol -Add-on hemoglobin A1c -CBGs before every meal with  moderate SSI -Adjust insulin regimen as needed -Diabetic education  CAD s/p PCI -Continue aspirin and statin  Essential hypertension Home blood pressure regimen includes amlodipine  10 mg daily and nebivolol 5 mg nightly. -Continue home blood pressure regimen -Hydralazine IV as needed for elevated blood pressure greater than 180  Normocytic anemia Acute.  Hemoglobin 12.7, but previously had been within normal limits.  No reports of bleeding. -Continue to monitor  Dyslipidemia -Continue Crestor  GERD -Continue pharmacy substitution of Protonix for Dexilant  History of gout Patient's symptoms suspected secondary to infection more so than gout at this time  Obesity BMI 38.52 kg/m   Advance Care Planning:   Code Status: Full Code   Consults: Orthopedics  Family Communication: Wife updated at bedside  Severity of Illness: The appropriate patient status for this patient is INPATIENT. Inpatient status is judged to be reasonable and necessary in order to provide the required intensity of service to ensure the patient's safety. The patient's presenting symptoms, physical exam findings, and initial radiographic and laboratory data in the context of their chronic comorbidities is felt to place them at high risk for further clinical deterioration. Furthermore, it is not anticipated that the patient will be medically stable for discharge from the hospital within 2 midnights of admission.   * I certify that at the point of admission it is my clinical judgment that the patient will require inpatient hospital care spanning beyond 2 midnights from the point of admission due to high intensity of service, high risk for further deterioration and high frequency of surveillance required.*  Author: Norval Morton, MD 07/23/2022 8:26 PM  For on call review [email or web address].

## 2022-07-23 NOTE — Sepsis Progress Note (Signed)
eLink is following this Code Sepsis. °

## 2022-07-23 NOTE — Progress Notes (Signed)
[facility] [address] [place] [address] Dept: [phone] Dept Fax: [phone]  Office Visit  Subjective:    Patient ID: Aaron Franco, male    DOB: [birth year], 51 y.o..   MRN: [id number]  Chief Complaint  Patient presents with   Follow-up    F/u leg/feet swelling.      History of Present Illness:  Patient is in today for reassessment of lower leg swelling. I had last seen him in May, at which time he complained of some mild bilateral lower leg swelling. He was seen on 9/7 with a similar complaint, except that he felt the lower left leg swelling was worse over the previous 5 days. He was set up for a venous ultrasound of the legs. The report was that there was no sign of a thrombus. However, the technician did see enlarged lymph nodes. The patient was advised to be seen to evaluate for possible infection. Aaron Franco has a history of type 2 diabetes complicated by coronary artery disease, peripheral neuropathy, and nephropathy. His last A1c in our office was 14.8% in Jan. 2023.  At his visit today, Aaron Franco again notes the issue with  swelling of the left lower leg primarily. He notes his left foot has been red. He is not aware of any infection being present.  Past Medical History: Patient Active Problem List   Diagnosis Date Noted   Chronic venous insufficiency of lower extremity 01/23/2021   Type 2 diabetes mellitus with hyperglycemia, with long-term current use of insulin (HCC) 12/07/2020   Allergic rhinitis due to pollen 06/29/2020   Diabetic neuropathy (HCC) 03/13/2020   Erectile dysfunction due to arterial insufficiency 03/13/2020   History of gout 11/28/2019   Coronary artery disease involving native heart without angina pectoris 03/29/2019   Onychomycosis 12/01/2017   Morbid obesity with BMI of 40.0-44.9, adult (HCC) 02/18/2016   Chest pain with moderate risk of acute coronary syndrome 09/17/2015    Type 2 diabetes mellitus with diabetic neuropathy, unspecified (HCC) 04/23/2015   Thrush 04/23/2015   Type II diabetes mellitus with renal manifestations (HCC) 02/16/2014   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   Hammer toe, acquired 02/16/2014   CAD S/P percutaneous coronary angioplasty -  01/24/2014   Murmur 02/21/2011   Bilateral lower extremity edema 12/24/2010   Vitamin B12 deficiency 07/19/2010   Anemia due to vitamin B12 deficiency 10/31/2009   Trigger finger 10/16/2009   Dyslipidemia 03/01/2007   Gout 03/01/2007   Essential hypertension 03/01/2007   Gastroesophageal reflux disease 03/01/2007   Past Surgical History:  Procedure Laterality Date   BICEPS TENDON REPAIR  Nov 13 2010   right   HAND SURGERY  1990   left   LEFT HEART CATH N/A 05/12/2013   Procedure: LEFT HEART CATH;  Surgeon: Lesleigh Noe, MD;  Location: [facility] CATH LAB;  Service: Cardiovascular;  Laterality: N/A;   LEFT HEART CATHETERIZATION WITH CORONARY ANGIOGRAM N/A 01/24/2014   Procedure: LEFT HEART CATHETERIZATION WITH CORONARY ANGIOGRAM;  Surgeon: Peter M [place]az[place], MD;  Location: [facility] CATH LAB;  Service: Cardiovascular;  Laterality: N/A;   PERCUTANEOUS CORONARY STENT INTERVENTION (PCI-S)  05/2013   mCx 3.34mm x 20 mm Promus P DES   PERCUTANEOUS CORONARY STENT INTERVENTION (PCI-S)  05/12/2013   Procedure: PERCUTANEOUS CORONARY STENT INTERVENTION (PCI-S);  Surgeon: Lesleigh Noe, MD;  Location: [facility] CATH LAB;  Service: Cardiovascular;;   Family History  Problem Relation Age of Onset   Heart attack Father  deceased   Diabetes Father    Congestive Heart Failure Father    Stroke Mother    Pancreatic cancer Maternal Aunt    Colon cancer Neg Hx    Outpatient Medications Prior to Visit  Medication Sig Dispense Refill   acetaminophen (TYLENOL) 325 MG tablet Take 2 tablets (650 mg total) by mouth every 4 (four) hours as needed for headache or mild pain.     allopurinol (ZYLOPRIM) 100 MG tablet TAKE 1 TABLET(100  MG) BY MOUTH DAILY (Patient taking differently: daily as needed. TAKE 1 TABLET(100 MG) BY MOUTH DAILY) 90 tablet 0   amLODipine (NORVASC) 10 MG tablet Take 1 tablet (10 mg total) by mouth daily. 90 tablet 3   aspirin 81 MG chewable tablet Chew 1 tablet (81 mg total) by mouth daily.     cetirizine (ZYRTEC) 10 MG tablet Take by mouth daily as needed.     colchicine 0.6 MG tablet Take 2 pills then take 1 pill 1 hour later 3 tablet 0   dapagliflozin propanediol (FARXIGA) 5 MG TABS tablet TAKE 1 TABLET(5 MG) BY MOUTH DAILY BREAKFAST 90 tablet 3   dexlansoprazole (DEXILANT) 60 MG capsule TAKE 1 CAPSULE(60 MG) BY MOUTH DAILY 90 capsule 2   Evolocumab (REPATHA SURECLICK) 140 MG/ML SOAJ Inject 140 mLs into the skin every 14 (fourteen) days. 6 mL 2   fluticasone (FLONASE) 50 MCG/ACT nasal spray Place 1 spray into both nostrils daily as needed for allergies or rhinitis. 18.2 mL 6   gabapentin (NEURONTIN) 600 MG tablet Take 1 tablet (600 mg total) by mouth 3 (three) times daily. (Patient taking differently: Take 600 mg by mouth 3 (three) times daily as needed.) 90 tablet 3   insulin degludec (TRESIBA FLEXTOUCH) 100 UNIT/ML FlexTouch Pen Inject 20 Units into the skin 2 (two) times daily. 45 mL 1   Insulin Pen Needle 31G X 8 MM MISC 1 Device by Does not apply route in the morning, at noon, in the evening, and at bedtime. 400 each 2   nebivolol (BYSTOLIC) 5 MG tablet TAKE 1 TABLET(5 MG) BY MOUTH DAILY 90 tablet 0   nitroGLYCERIN (NITROSTAT) 0.4 MG SL tablet Place 1 tablet (0.4 mg total) under the tongue every 5 (five) minutes x 3 doses as needed for chest pain. 25 tablet 2   Omega-3 Fatty Acids (FISH OIL) 1000 MG CAPS Take 1,000 mg by mouth 2 (two) times a day.     omeprazole (PRILOSEC) 40 MG capsule TAKE 1 CAPSULE(40 MG) BY MOUTH DAILY 90 capsule 1   rosuvastatin (CRESTOR) 40 MG tablet TAKE 1 TABLET(40 MG) BY MOUTH AT BEDTIME 90 tablet 3   vitamin B-12 (CYANOCOBALAMIN) 500 MCG tablet Take 1 tablet (500 mcg total)  by mouth daily. 90 tablet 1   No facility-administered medications prior to visit.   Allergies  Allergen Reactions   Influenza Vaccines Anaphylaxis    Reaction June 2017   Influenza Virus Vaccine H5n1 Anaphylaxis   Fenofibrate Other (See Comments)    Hot flashes   Metformin Other (See Comments)    Chest discomfort   Prednisone Other (See Comments)    Mood swings   Augmentin [Amoxicillin-Pot Clavulanate] Rash    Objective:   Today's Vitals   07/23/22 1508  BP: 126/68  Pulse: 81  Temp: 97.8 F (36.6 C)  TempSrc: Temporal  SpO2: 97%  Weight: 284 lb (128.8 kg)  Height: 6' (1.829 m)   Body mass index is 38.52 kg/m.   General: Well developed, well nourished.  No acute distress.The left lower leg has 2+ edema present. The left   foot is generally swollen and bright red. There is a fluctuant area along the lateral margin of the forefoot with   visible pus under the skin. There is none draining currently. The skin in this area is cracked and scaling. Skin: Psych: Alert and oriented. Normal mood and affect.  Health Maintenance Due  Topic Date Due   OPHTHALMOLOGY EXAM  Never done   Hepatitis C Screening  Never done   COLONOSCOPY (Pts 45-50yrs Insurance coverage will need to be confirmed)  Never done   Zoster Vaccines- Shingrix (1 of 2) Never done   Diabetic kidney evaluation - Urine ACR  03/15/2021   FOOT EXAM  01/23/2022   HEMOGLOBIN A1C  05/28/2022   Imaging: Left foot x-ray: Swelling noted. The bones of the foot appear normal. No sign of free gas in the tissue.  Lab Results:    Component Ref Range & Units 6 d ago 4 mo ago  Pro B Natriuretic peptide (BNP) 0.0 - 100.0 pg/mL 34.0  28.0    Assessment & Plan:   1. Diabetic foot infection (HCC)  - DG Foot Complete Left  2. Type 2 diabetes mellitus with diabetic neuropathy, with long-term current use of insulin (HCC) 3. Diabetic polyneuropathy associated with type 2 diabetes mellitus (HCC)  I reviewed the recent  doppler ultrasound results and lab tests. Aaron Franco has an acute diabetic foot infection. O exam, he has cellulitis with an abscess. This will require surgical I&D and possible debridement and wound management with IV antibiotics. In light of his past uncontrolled diabetes, I anticipate that this will best be managed in an inpatient status. I have advised him to go to the ED for further evaluation and treatment. I called the [facility] and gave a report to their triage nurse. I feel he is appropriate to be taken by POV.  Return for Go to ED for evaluation.Loyola Mast, MD

## 2022-07-23 NOTE — ED Provider Notes (Signed)
[facility] EMERGENCY DEPARTMENT Provider Note   CSN: [id number] Arrival date & time: 07/23/22  1656     History  Chief Complaint  Patient presents with   Diabetic Ulcer    Aaron Franco is a 51 y.o. male.  Patient with history of diabetes, hypertension, high cholesterol, coronary artery disease status post stenting, known peripheral arterial disease, HFpEF --presents to the emergency department today after being seen by primary care provider earlier today.  Patient has developed worsening pain, redness, and swelling to the left foot.  He also has mild sloughing of the skin.  Temperature of 101.2 F on arrival to the emergency department.  He was seen by primary care on 07/17/2022 for evaluation of worsening lower extremity swelling that was worse on the left.  He was sent for a DVT study on 9/8 which was negative.  He reports having a cough, but relates that this may be related to blood pressure medication.  No upper respiratory infection symptoms.  No chest pain or shortness of breath.  No abdominal pain or vomiting.      Home Medications Prior to Admission medications   Medication Sig Start Date End Date Taking? Authorizing Provider  acetaminophen (TYLENOL) 325 MG tablet Take 2 tablets (650 mg total) by mouth every 4 (four) hours as needed for headache or mild pain. 05/06/17   Abelino Derrick, PA-C  allopurinol (ZYLOPRIM) 100 MG tablet TAKE 1 TABLET(100 MG) BY MOUTH DAILY Patient taking differently: daily as needed. TAKE 1 TABLET(100 MG) BY MOUTH DAILY 12/21/20   Mliss Sax, MD  amLODipine (NORVASC) 10 MG tablet Take 1 tablet (10 mg total) by mouth daily. 03/18/22   Loyola Mast, MD  aspirin 81 MG chewable tablet Chew 1 tablet (81 mg total) by mouth daily. 05/14/13   Tereso Newcomer T, PA-C  cetirizine (ZYRTEC) 10 MG tablet Take by mouth daily as needed. 05/27/20   [provider]  colchicine 0.6 MG tablet Take 2 pills then take 1 pill 1 hour later  07/17/22   Garnette Gunner, MD  dapagliflozin propanediol (FARXIGA) 5 MG TABS tablet TAKE 1 TABLET(5 MG) BY MOUTH DAILY BREAKFAST 01/06/22   Loyola Mast, MD  dexlansoprazole (DEXILANT) 60 MG capsule TAKE 1 CAPSULE(60 MG) BY MOUTH DAILY 07/07/22   Loyola Mast, MD  Evolocumab (REPATHA SURECLICK) 140 MG/ML SOAJ Inject 140 mLs into the skin every 14 (fourteen) days. 05/02/22   Hilty, Lisette Abu, MD  fluticasone (FLONASE) 50 MCG/ACT nasal spray Place 1 spray into both nostrils daily as needed for allergies or rhinitis. 06/29/20 10/13/24  Mliss Sax, MD  gabapentin (NEURONTIN) 600 MG tablet Take 1 tablet (600 mg total) by mouth 3 (three) times daily. Patient taking differently: Take 600 mg by mouth 3 (three) times daily as needed. 01/23/21   Loyola Mast, MD  insulin degludec (TRESIBA FLEXTOUCH) 100 UNIT/ML FlexTouch Pen Inject 20 Units into the skin 2 (two) times daily. 06/06/22   Loyola Mast, MD  Insulin Pen Needle 31G X 8 MM MISC 1 Device by Does not apply route in the morning, at noon, in the evening, and at bedtime. 01/04/21   Shamleffer, Konrad Dolores, MD  nebivolol (BYSTOLIC) 5 MG tablet TAKE 1 TABLET(5 MG) BY MOUTH DAILY 06/05/22   Loyola Mast, MD  nitroGLYCERIN (NITROSTAT) 0.4 MG SL tablet Place 1 tablet (0.4 mg total) under the tongue every 5 (five) minutes x 3 doses as needed for chest pain. 12/21/20  Mliss Sax, MD  Omega-3 Fatty Acids (FISH OIL) 1000 MG CAPS Take 1,000 mg by mouth 2 (two) times a day.    [provider]  omeprazole (PRILOSEC) 40 MG capsule TAKE 1 CAPSULE(40 MG) BY MOUTH DAILY 07/10/22   Mliss Sax, MD  rosuvastatin (CRESTOR) 40 MG tablet TAKE 1 TABLET(40 MG) BY MOUTH AT BEDTIME 10/21/21   Loyola Mast, MD  vitamin B-12 (CYANOCOBALAMIN) 500 MCG tablet Take 1 tablet (500 mcg total) by mouth daily. 03/20/20   Mliss Sax, MD      Allergies    Influenza vaccines, Influenza virus vaccine h5n1, Fenofibrate,  Metformin, Prednisone, and Augmentin [amoxicillin-pot clavulanate]    Review of Systems   Review of Systems  Physical Exam Updated Vital Signs BP (!) 156/80   Pulse 96   Temp (!) 101.2 F (38.4 C) (Oral)   Resp 18   Ht 6' (1.829 m)   Wt 128.8 kg   SpO2 100%   BMI 38.52 kg/m   Physical Exam Vitals and nursing note reviewed.  Constitutional:      General: He is not in acute distress.    Appearance: He is well-developed.  HENT:     Head: Normocephalic and atraumatic.  Eyes:     General:        Right eye: No discharge.        Left eye: No discharge.     Conjunctiva/sclera: Conjunctivae normal.  Cardiovascular:     Rate and Rhythm: Regular rhythm. Tachycardia present.     Heart sounds: Normal heart sounds.  Pulmonary:     Effort: Pulmonary effort is normal.     Breath sounds: Normal breath sounds.  Abdominal:     Palpations: Abdomen is soft.     Tenderness: There is no abdominal tenderness.  Musculoskeletal:     Cervical back: Normal range of motion and neck supple.  Skin:    General: Skin is warm and dry.     Comments: Patient with erythema over the top of the foot with some epidermal sloughing of skin laterally over the fourth and fifth metatarsals.  There does appear to be the suggestion of some fluctuance in this area.  Area is warm.  Patient with mild tenderness to palpation.  Neurological:     Mental Status: He is alert.    ED Results / Procedures / Treatments   Labs (all labs ordered are listed, but only abnormal results are displayed) Labs Reviewed  COMPREHENSIVE METABOLIC PANEL - Abnormal; Notable for the following components:      Result Value   Glucose, Bld 239 (*)    Total Protein 8.7 (*)    Albumin 3.1 (*)    AST 14 (*)    All other components within normal limits  CBC WITH DIFFERENTIAL/PLATELET - Abnormal; Notable for the following components:   WBC 14.6 (*)    Hemoglobin 12.7 (*)    MCH 25.9 (*)    Neutro Abs 11.4 (*)    Monocytes Absolute 1.3  (*)    Abs Immature Granulocytes 0.11 (*)    All other components within normal limits  RESP PANEL BY RT-PCR (FLU A&B, COVID) ARPGX2  CULTURE, BLOOD (ROUTINE X 2)  CULTURE, BLOOD (ROUTINE X 2)  LACTIC ACID, PLASMA  LACTIC ACID, PLASMA  PROTIME-INR  APTT  URINALYSIS, ROUTINE W REFLEX MICROSCOPIC  HIV ANTIBODY (ROUTINE TESTING W REFLEX)  CBC  BASIC METABOLIC PANEL  HEMOGLOBIN A1C  SEDIMENTATION RATE  C-REACTIVE PROTEIN  EKG EKG Interpretation  Date/Time:  Wednesday July 23 2022 19:25:17 EDT Ventricular Rate:  98 PR Interval:  154 QRS Duration: 83 QT Interval:  350 QTC Calculation: 447 R Axis:   -2 Text Interpretation: Sinus rhythm Confirmed by Cathren Laine ([id number]) on 07/23/2022 7:58:23 PM  Radiology DG Chest Port 1 View  Result Date: 07/23/2022 CLINICAL DATA:  Sepsis EXAM: PORTABLE CHEST 1 VIEW COMPARISON:  12/27/2020 FINDINGS: The heart size and mediastinal contours are within normal limits. Both lungs are clear. The visualized skeletal structures are unremarkable. IMPRESSION: No active disease. Electronically Signed   By: Deatra Robinson M.D.   On: 07/23/2022 19:34    Procedures Procedures    Medications Ordered in ED Medications  lactated ringers infusion ( Intravenous New Bag/Given 07/23/22 1930)  lactated ringers bolus 1,000 mL (0 mLs Intravenous Stopped 07/23/22 2029)  acetaminophen (TYLENOL) tablet 650 mg (650 mg Oral Given 07/23/22 1931)  ceFEPIme (MAXIPIME) 2 g in sodium chloride 0.9 % 100 mL IVPB (0 g Intravenous Stopped 07/23/22 2000)  metroNIDAZOLE (FLAGYL) IVPB 500 mg (500 mg Intravenous New Bag/Given 07/23/22 1932)    ED Course/ Medical Decision Making/ A&P    Patient seen and examined. History obtained directly from patient. I reviewed PCP note from earlier today.  Sepsis work-up initiated due to fever, heart rate greater than 90, left diabetic foot infection.  Labs/EKG: Ordered CBC, CMP, lactate, blood cultures, COVID testing.  Imaging: Ordered  chest x-ray, MRI left foot.  Medications/Fluids: Ordered: IV Zosyn, IV fluid bolus, p.o. Tylenol.   Most recent vital signs reviewed and are as follows: BP (!) 156/80   Pulse 96   Temp (!) 101.2 F (38.4 C) (Oral)   Resp 18   Ht 6' (1.829 m)   Wt 128.8 kg   SpO2 100%   BMI 38.52 kg/m   Initial impression: Sepsis, left foot infection and diabetic patient.  6:20 PM Discussed with pharmacist, rash reported to augmentin so will treat with cefepime, flagyl.  8:33 PM Reassessment performed. Patient appears stable.   Labs personally reviewed and interpreted including: CBC with leukocytosis 14.6, hemoglobin 12.7;  Imaging personally visualized and interpreted including: Chest x-ray, agree negative.  MRI, fluid collection suspected.  Reviewed pertinent lab work and imaging with patient at bedside. Questions answered.   Most current vital signs reviewed and are as follows: BP 139/75   Pulse 96   Temp (!) 101.2 F (38.4 C) (Oral)   Resp 13   Ht 6' (1.829 m)   Wt 128.8 kg   SpO2 98%   BMI 38.52 kg/m   Plan: Patient to hospital.  I have consulted with Dr. Katrinka Blazing, [facility] hospitalist, by telephone who will admit patient.  9:15 PM Called by radiologist regarding MRI findings.  Patient has fluid collection consistent with abscess, some changes of the bone consistent with reaction versus early osteomyelitis, also joint effusions.                               Medical Decision Making Amount and/or Complexity of Data Reviewed Labs: ordered. Radiology: ordered. ECG/medicine tests: ordered.  Risk OTC drugs. Prescription drug management. Decision regarding hospitalization.   Patient with significant diabetic foot infection meeting sepsis criteria with fever, elevated white blood cell count, elevated pulse rate.  No other infectious symptoms.  Recent lower extremity ultrasound negative for DVT.  Has great pedal pulses bilaterally, low concern for significant arterial disease.    CRITICAL CARE  Performed by: Renne Crigler PA-C Total critical care time: 40 minutes Critical care time was exclusive of separately billable procedures and treating other patients. Critical care was necessary to treat or prevent imminent or life-threatening deterioration. Critical care was time spent personally by me on the following activities: development of treatment plan with patient and/or surrogate as well as nursing, discussions with consultants, evaluation of patient's response to treatment, examination of patient, obtaining history from patient or surrogate, ordering and performing treatments and interventions, ordering and review of laboratory studies, ordering and review of radiographic studies, pulse oximetry and re-evaluation of patient's condition.         Final Clinical Impression(s) / ED Diagnoses Final diagnoses:  Sepsis without acute organ dysfunction, due to unspecified organism [facility])  Diabetic foot infection [facility])    Rx / DC Orders ED Discharge Orders     None         Renne Crigler, Cordelia Poche 07/23/22 2137    Cathren Laine, MD 07/25/22 1501

## 2022-07-23 NOTE — Progress Notes (Signed)
Patient discussed with admitting physician.  Imaging reviewed.  Concern for infection with fluid collections of left forefoot.  Currently stable.  May benefit from non-urgent surgical intervention.  NPO at midnight for possible OR.  Full consult note to follow.  Ernestina Columbia M.D. Orthopaedic Surgery [facility]

## 2022-07-23 NOTE — Progress Notes (Unsigned)
Intial visit for left foot welling and redness for the past 7 days. Per provider, showing signs of abscesses.

## 2022-07-23 NOTE — Progress Notes (Addendum)
Pharmacy Antibiotic Note  Aaron Franco is a 51 y.o. male admitted on 07/23/2022 with  Wound infection Pharmacy has been consulted for Zosyn dosing. Patient has lower leg swelling with signs of enlarged lymph nodes. PMH of T2DM (last A1c 14.8%), CAD, peripheral neuropathy, and nephropathy. Last Scr 0.8 (1/23), no new labs. Patient had fever of 101.3, elevated blood pressure, and other vitals stable.   Reports allergy to Augmentin (rash)--> switching to cefepime and flagyl per PA.  Plan: Cefepime 2g IV every 8 hours Flagyl 500mg  IV every 12 hours Monitor kidney function Monitor signs of clinical improvement MRI pending   Height: 6' (182.9 cm) Weight: 128.8 kg (284 lb) IBW/kg (Calculated) : 77.6  Temp (24hrs), Avg:100.1 F (37.8 C), Min:97.8 F (36.6 C), Max:101.3 F (38.5 C)  No results for input(s): "WBC", "CREATININE", "LATICACIDVEN", "VANCOTROUGH", "VANCOPEAK", "VANCORANDOM", "GENTTROUGH", "GENTPEAK", "GENTRANDOM", "TOBRATROUGH", "TOBRAPEAK", "TOBRARND", "AMIKACINPEAK", "AMIKACINTROU", "AMIKACIN" in the last 168 hours.  CrCl cannot be calculated (Patient's most recent lab result is older than the maximum 21 days allowed.).    Allergies  Allergen Reactions   Influenza Vaccines Anaphylaxis    Reaction June 2017   Influenza Virus Vaccine H5n1 Anaphylaxis   Fenofibrate Other (See Comments)    Hot flashes   Metformin Other (See Comments)    Chest discomfort   Prednisone Other (See Comments)    Mood swings   Augmentin [Amoxicillin-Pot Clavulanate] Rash    Antimicrobials this admission: 9/13 cefepime >>  9/13 flagyl  Microbiology results: 9/13 BCx: pending  Thank you for allowing pharmacy to be a part of this patient's care.  10/13, PharmD. [facility] Acute Care PGY-1  07/23/2022 6:14 PM

## 2022-07-24 ENCOUNTER — Encounter: Payer: Self-pay | Admitting: Family Medicine

## 2022-07-24 ENCOUNTER — Inpatient Hospital Stay (HOSPITAL_COMMUNITY): Payer: BC Managed Care – PPO

## 2022-07-24 DIAGNOSIS — R011 Cardiac murmur, unspecified: Secondary | ICD-10-CM | POA: Diagnosis not present

## 2022-07-24 DIAGNOSIS — D649 Anemia, unspecified: Secondary | ICD-10-CM | POA: Diagnosis present

## 2022-07-24 DIAGNOSIS — A419 Sepsis, unspecified organism: Secondary | ICD-10-CM | POA: Diagnosis present

## 2022-07-24 DIAGNOSIS — L02619 Cutaneous abscess of unspecified foot: Secondary | ICD-10-CM | POA: Diagnosis present

## 2022-07-24 DIAGNOSIS — E11628 Type 2 diabetes mellitus with other skin complications: Secondary | ICD-10-CM | POA: Diagnosis not present

## 2022-07-24 DIAGNOSIS — L089 Local infection of the skin and subcutaneous tissue, unspecified: Secondary | ICD-10-CM | POA: Diagnosis not present

## 2022-07-24 DIAGNOSIS — Z794 Long term (current) use of insulin: Secondary | ICD-10-CM

## 2022-07-24 LAB — C-REACTIVE PROTEIN: CRP: 21.5 mg/dL — ABNORMAL HIGH (ref <1.0)

## 2022-07-24 LAB — CBC
HCT: 35.9 % — ABNORMAL LOW (ref 39.0–52.0)
Hemoglobin: 11.3 g/dL — ABNORMAL LOW (ref 13.0–17.0)
MCH: 26.3 pg (ref 26.0–34.0)
MCHC: 31.5 g/dL (ref 30.0–36.0)
MCV: 83.5 fL (ref 80.0–100.0)
Platelets: 289 10*3/uL (ref 150–400)
RBC: 4.3 MIL/uL (ref 4.22–5.81)
RDW: 13.6 % (ref 11.5–15.5)
WBC: 12 10*3/uL — ABNORMAL HIGH (ref 4.0–10.5)
nRBC: 0 % (ref 0.0–0.2)

## 2022-07-24 LAB — BASIC METABOLIC PANEL
Anion gap: 7 (ref 5–15)
BUN: 9 mg/dL (ref 6–20)
CO2: 23 mmol/L (ref 22–32)
Calcium: 8.5 mg/dL — ABNORMAL LOW (ref 8.9–10.3)
Chloride: 106 mmol/L (ref 98–111)
Creatinine, Ser: 0.8 mg/dL (ref 0.61–1.24)
GFR, Estimated: >60 mL/min (ref >60)
Glucose, Bld: 309 mg/dL — ABNORMAL HIGH (ref 70–99)
Potassium: 4 mmol/L (ref 3.5–5.1)
Sodium: 136 mmol/L (ref 135–145)

## 2022-07-24 LAB — ECHOCARDIOGRAM COMPLETE
AR max vel: 2.5 cm2
AV Area VTI: 2.5 cm2
AV Area mean vel: 2.73 cm2
AV Mean grad: 8 mmHg
AV Peak grad: 14.7 mmHg
Ao pk vel: 1.92 m/s
Area-P 1/2: 2.96 cm2
Height: 72 in
S' Lateral: 3.1 cm
Weight: 4624 oz

## 2022-07-24 LAB — GLUCOSE, CAPILLARY
Glucose-Capillary: 207 mg/dL — ABNORMAL HIGH (ref 70–99)
Glucose-Capillary: 230 mg/dL — ABNORMAL HIGH (ref 70–99)
Glucose-Capillary: 179 mg/dL — ABNORMAL HIGH (ref 70–99)
Glucose-Capillary: 207 mg/dL — ABNORMAL HIGH (ref 70–99)

## 2022-07-24 LAB — HIV ANTIBODY (ROUTINE TESTING W REFLEX): HIV Screen 4th Generation wRfx: NONREACTIVE

## 2022-07-24 MED ORDER — INSULIN GLARGINE-YFGN 100 UNIT/ML ~~LOC~~ SOLN
14.0000 [IU] | Freq: Every day | SUBCUTANEOUS | Status: DC
  Administered 2022-07-24 – 2022-07-27 (×4): 14 [IU] via SUBCUTANEOUS
  Filled 2022-07-24 (×6): qty 0.14

## 2022-07-24 MED ORDER — METRONIDAZOLE 500 MG PO TABS
500.0000 mg | ORAL_TABLET | Freq: Two times a day (BID) | ORAL | Status: DC
  Administered 2022-07-24 – 2022-07-28 (×8): 500 mg via ORAL
  Filled 2022-07-24 (×9): qty 1

## 2022-07-24 NOTE — Inpatient Diabetes Management (Addendum)
Inpatient Diabetes Program Recommendations  AACE/ADA: New Consensus Statement on Inpatient Glycemic Control (2015)  Target Ranges:  Prepandial:   less than 140 mg/dL      Peak postprandial:   less than 180 mg/dL (1-2 hours)      Critically ill patients:  140 - 180 mg/dL   Lab Results  Component Value Date   GLUCAP 207 (H) 07/24/2022   HGBA1C 13.4 (H) 07/23/2022    Review of Glycemic Control  Latest Reference Range & Units 07/23/22 21:44 07/24/22 08:03  Glucose-Capillary 70 - 99 mg/dL 924 (H) 268 (H)  (H): Data is abnormally high Diabetes history: Type 2 DM Outpatient Diabetes medications: Farxiga 5 mg QD, Tresiba 20 units BID (NT), Trulicity 0.5 mg Qwk (NT) Current orders for Inpatient glycemic control: Farxiga 5 mg QD, Novolog 0-15 units TID, & HS  Inpatient Diabetes Program Recommendations:    Consider adding Semglee 14 units QD.  Addendum: spoke with patient and wife regarding outpatient diabetes management.  Reviewed patient's current A1c of 13.4%. Explained what a A1c is and what it measures. Also reviewed goal A1c with patient, importance of good glucose control @ home, and blood sugar goals. Reviewed patho of DM, need for insulin and improve glycemic control, survival skills, vascular changes, risk of continued infection, and other commorbidities.  Patient will need a meter. Blood glucose meter #[id number]. Reviewed recommended frequency and when to call MD. Patient's wife has made calls to schedule outpatient endocrinology; list attached to DC summary. Also, reviewed Freestyle libre.  Admits to frequently consuming large amounts of sugary beverages. Reviewed alternatives, plate method, counting carbohydrates, reading nutritional labels and incorporating protein. Dietitian consult and outpatient education referral placed. Of note, patient has been on Trulicity in the past and discussed GLPs and benefits. Stopped using because he did not think it was working correctly once dose was  increased to 1.5 mg. Thus, could continue Trulicity at 0.5 mg qwk and would recommend this at discharge.  Secure chat sent to MD.     Thanks, Lujean Rave, MSN, RNC-OB Diabetes Coordinator [phone] (8a-5p)

## 2022-07-24 NOTE — Consult Note (Signed)
Reason for Consult:Left foot infection Referring Physician: Carma Leaven Time called: [id number] Time at bedside: 1019   Aaron Franco is an 51 y.o. male.  HPI: [name] comes in with ~1w hx/o left foot swelling and pain. He denies any trauma or other antecedent event. He denies prior hx/o similar. MRI was suspicious for early osteo and orthopedic surgery was consulted. 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  Past Medical History:  Diagnosis Date   CAD (coronary artery disease)    a. 05/2013 NSTEMI/PCI: mCFX 100%-> 3.0 mm  x 20 mm Promus premier DES, EF 65%;  01/2014 Cath: LM nl, LAD 20-30d, LCX patent distal stent, RCA 30-58m, EF 55-65%.   Diabetes mellitus    Essential hypertension    GERD (gastroesophageal reflux disease)    Gout    History of echocardiogram    a. 03/2011 Echo: EF 65%, mild LVH, no rwma, mildly dil LA.   Hyperlipidemia    MI, old    Microalbuminuria due to type 2 diabetes mellitus (HCC) 12/14/2017   Obesity     Past Surgical History:  Procedure Laterality Date   BICEPS TENDON REPAIR  Nov 13 2010   right   HAND SURGERY  1990   left   LEFT HEART CATH N/A 05/12/2013   Procedure: LEFT HEART CATH;  Surgeon: Lesleigh Noe, MD;  Location: [facility] CATH LAB;  Service: Cardiovascular;  Laterality: N/A;   LEFT HEART CATHETERIZATION WITH CORONARY ANGIOGRAM N/A 01/24/2014   Procedure: LEFT HEART CATHETERIZATION WITH CORONARY ANGIOGRAM;  Surgeon: Peter M Swaziland, MD;  Location: [facility] CATH LAB;  Service: Cardiovascular;  Laterality: N/A;   PERCUTANEOUS CORONARY STENT INTERVENTION (PCI-S)  05/2013   mCx 3.58mm x 20 mm Promus P DES   PERCUTANEOUS CORONARY STENT INTERVENTION (PCI-S)  05/12/2013   Procedure: PERCUTANEOUS CORONARY STENT INTERVENTION (PCI-S);  Surgeon: Lesleigh Noe, MD;  Location: [facility] CATH LAB;  Service: Cardiovascular;;    Family History  Problem Relation Age of Onset   Heart attack Father        deceased   Diabetes Father    Congestive Heart  Failure Father    Stroke Mother    Pancreatic cancer Maternal Aunt    Colon cancer Neg Hx     Social History:  reports that he quit smoking about 23 years ago. His smoking use included cigarettes. He has never used smokeless tobacco. He reports current alcohol use. He reports that he does not use drugs.  Allergies:  Allergies  Allergen Reactions   Influenza Vaccines Anaphylaxis    Reaction June 2017   Influenza Virus Vaccine H5n1 Anaphylaxis   Fenofibrate Other (See Comments)    Hot flashes   Metformin Other (See Comments)    Chest discomfort   Prednisone Other (See Comments)    Mood swings   Augmentin [Amoxicillin-Pot Clavulanate] Rash   Latex Rash    Medications: I have reviewed the patient's current medications.  Results for orders placed or performed during the hospital encounter of 07/23/22 (from the past 48 hour(s))  Lactic acid, plasma     Status: None   Collection Time: 07/23/22  5:58 PM  Result Value Ref Range   Lactic Acid, Venous 1.1 0.5 - 1.9 mmol/L    Comment: Performed at [facility], [address] [place], [place] [address]  Comprehensive metabolic panel     Status: Abnormal   Collection Time: 07/23/22  5:58 PM  Result Value Ref Range  Sodium 138 135 - 145 mmol/L   Potassium 4.1 3.5 - 5.1 mmol/L   Chloride 102 98 - 111 mmol/L   CO2 24 22 - 32 mmol/L   Glucose, Bld 239 (H) 70 - 99 mg/dL    Comment: Glucose reference range applies only to samples taken after fasting for at least 8 hours.   BUN 12 6 - 20 mg/dL   Creatinine, Ser 8.14 0.61 - 1.24 mg/dL   Calcium 9.6 8.9 - 48.1 mg/dL   Total Protein 8.7 (H) 6.5 - 8.1 g/dL   Albumin 3.1 (L) 3.5 - 5.0 g/dL   AST 14 (L) 15 - 41 U/L   ALT 14 0 - 44 U/L   Alkaline Phosphatase 91 38 - 126 U/L   Total Bilirubin 0.7 0.3 - 1.2 mg/dL   GFR, Estimated >85 >63 mL/min    Comment: (NOTE) Calculated using the CKD-EPI Creatinine Equation (2021)    Anion gap 12 5 - 15    Comment: Performed at [facility], [address], [place], [place] [address]  CBC with Differential     Status: Abnormal   Collection Time: 07/23/22  5:58 PM  Result Value Ref Range   WBC 14.6 (H) 4.0 - 10.5 K/uL   RBC 4.91 4.22 - 5.81 MIL/uL   Hemoglobin 12.7 (L) 13.0 - 17.0 g/dL   HCT 26.3 78.5 - 88.5 %   MCV 83.9 80.0 - 100.0 fL   MCH 25.9 (L) 26.0 - 34.0 pg   MCHC 30.8 30.0 - 36.0 g/dL   RDW 02.7 74.1 - 28.7 %   Platelets 360 150 - 400 K/uL   nRBC 0.0 0.0 - 0.2 %   Neutrophils Relative % 79 %   Neutro Abs 11.4 (H) 1.7 - 7.7 K/uL   Lymphocytes Relative 11 %   Lymphs Abs 1.6 0.7 - 4.0 K/uL   Monocytes Relative 9 %   Monocytes Absolute 1.3 (H) 0.1 - 1.0 K/uL   Eosinophils Relative 0 %   Eosinophils Absolute 0.1 0.0 - 0.5 K/uL   Basophils Relative 0 %   Basophils Absolute 0.1 0.0 - 0.1 K/uL   Immature Granulocytes 1 %   Abs Immature Granulocytes 0.11 (H) 0.00 - 0.07 K/uL    Comment: Performed at [facility], [address] [place], [place] [address]  Protime-INR     Status: None   Collection Time: 07/23/22  5:58 PM  Result Value Ref Range   Prothrombin Time 13.6 11.4 - 15.2 seconds   INR 1.1 0.8 - 1.2    Comment: (NOTE) INR goal varies based on device and disease states. Performed at [facility], [address], [place], [place] [address]   APTT     Status: None   Collection Time: 07/23/22  5:58 PM  Result Value Ref Range   aPTT 31 24 - 36 seconds    Comment: Performed at [facility], [address], [place], [place] [address]  Blood Culture (routine x 2)     Status: None (Preliminary result)   Collection Time: 07/23/22  5:58 PM   Specimen: BLOOD  Result Value Ref Range   Specimen Description BLOOD BLOOD RIGHT FOREARM    Special Requests      BOTTLES DRAWN AEROBIC AND ANAEROBIC Blood Culture adequate volume   Culture      NO GROWTH < 12 HOURS Performed at [facility], [address], [place], [place] [address]  Report Status PENDING   Resp Panel by RT-PCR  (Flu A&B, Covid) Anterior Nasal Swab     Status: None   Collection Time: 07/23/22  6:00 PM   Specimen: Anterior Nasal Swab  Result Value Ref Range   SARS Coronavirus 2 by RT PCR NEGATIVE NEGATIVE    Comment: (NOTE) SARS-CoV-2 target nucleic acids are NOT DETECTED.  The SARS-CoV-2 RNA is generally detectable in upper respiratory specimens during the acute phase of infection. The lowest concentration of SARS-CoV-2 viral copies this assay can detect is 138 copies/mL. A negative result does not preclude SARS-Cov-2 infection and should not be used as the sole basis for treatment or other patient management decisions. A negative result may occur with  improper specimen collection/handling, submission of specimen other than nasopharyngeal swab, presence of viral mutation(s) within the areas targeted by this assay, and inadequate number of viral copies(<138 copies/mL). A negative result must be combined with clinical observations, patient history, and epidemiological information. The expected result is Negative.  Fact Sheet for Patients:  BloggerCourse.comhttps://www.fda.gov/media/152166/download  Fact Sheet for Healthcare Providers:  SeriousBroker.ithttps://www.fda.gov/media/152162/download  This test is no t yet approved or cleared by the [place] FDA and  has been authorized for detection and/or diagnosis of SARS-CoV-2 by FDA under an Emergency Use Authorization (EUA). This EUA will remain  in effect (meaning this test can be used) for the duration of the COVID-19 declaration under Section 564(b)(1) of the Act, 21 U.S.C.section 360bbb-3(b)(1), unless the authorization is terminated  or revoked sooner.       Influenza A by PCR NEGATIVE NEGATIVE   Influenza B by PCR NEGATIVE NEGATIVE    Comment: (NOTE) The Xpert Xpress SARS-CoV-2/FLU/RSV plus assay is intended as an aid in the diagnosis of influenza from Nasopharyngeal swab specimens and should not be used as a sole basis for treatment. Nasal washings  and aspirates are unacceptable for Xpert Xpress SARS-CoV-2/FLU/RSV testing.  Fact Sheet for Patients: BloggerCourse.comhttps://www.fda.gov/media/152166/download  Fact Sheet for Healthcare Providers: SeriousBroker.ithttps://www.fda.gov/media/152162/download  This test is not yet approved or cleared by the [place] FDA and has been authorized for detection and/or diagnosis of SARS-CoV-2 by FDA under an Emergency Use Authorization (EUA). This EUA will remain in effect (meaning this test can be used) for the duration of the COVID-19 declaration under Section 564(b)(1) of the Act, 21 U.S.C. section 360bbb-3(b)(1), unless the authorization is terminated or revoked.  Performed at [facility], [address] [place], [place] [address]   Blood Culture (routine x 2)     Status: None (Preliminary result)   Collection Time: 07/23/22  6:05 PM   Specimen: BLOOD  Result Value Ref Range   Specimen Description BLOOD SITE NOT SPECIFIED    Special Requests      BOTTLES DRAWN AEROBIC AND ANAEROBIC Blood Culture adequate volume   Culture      NO GROWTH < 12 HOURS Performed at [facility], [address] [place], [place] [address]    Report Status PENDING   Lactic acid, plasma     Status: None   Collection Time: 07/23/22  7:21 PM  Result Value Ref Range   Lactic Acid, Venous 1.4 0.5 - 1.9 mmol/L    Comment: Performed at [facility], [address] [place], [place] [address]  HIV Antibody (routine testing w rflx)     Status: None   Collection Time: 07/23/22  8:56 PM  Result Value Ref Range   HIV Screen 4th Generation wRfx Non Reactive Non  Reactive    Comment: Performed at [facility], [address], [place], [place] [address]  Sedimentation rate     Status: Abnormal   Collection Time: 07/23/22  8:56 PM  Result Value Ref Range   Sed Rate 99 (H) 0 - 16 mm/hr    Comment: Performed at [facility], [address] [place], [place] [address]  C-reactive protein     Status: Abnormal    Collection Time: 07/23/22  8:56 PM  Result Value Ref Range   CRP 21.5 (H) <1.0 mg/dL    Comment: Performed at [facility], [address] [place], [place] [address]  Hemoglobin A1c     Status: Abnormal   Collection Time: 07/23/22  9:11 PM  Result Value Ref Range   Hgb A1c MFr Bld 13.4 (H) 4.8 - 5.6 %    Comment: (NOTE) Pre diabetes:          5.7%-6.4%  Diabetes:              >6.4%  Glycemic control for   <7.0% adults with diabetes    Mean Plasma Glucose 337.88 mg/dL    Comment: Performed at [facility], [address], [place], [place] [address]  CBG monitoring, ED     Status: Abnormal   Collection Time: 07/23/22  9:44 PM  Result Value Ref Range   Glucose-Capillary 153 (H) 70 - 99 mg/dL    Comment: Glucose reference range applies only to samples taken after fasting for at least 8 hours.  Urinalysis, Routine w reflex microscopic     Status: Abnormal   Collection Time: 07/23/22 10:19 PM  Result Value Ref Range   Color, Urine YELLOW YELLOW   APPearance CLEAR CLEAR   Specific Gravity, Urine 1.031 (H) 1.005 - 1.030   pH 6.0 5.0 - 8.0   Glucose, UA >=500 (A) NEGATIVE mg/dL   Hgb urine dipstick NEGATIVE NEGATIVE   Bilirubin Urine NEGATIVE NEGATIVE   Ketones, ur 80 (A) NEGATIVE mg/dL   Protein, ur 30 (A) NEGATIVE mg/dL   Nitrite NEGATIVE NEGATIVE   Leukocytes,Ua NEGATIVE NEGATIVE   RBC / HPF 0-5 0 - 5 RBC/hpf   WBC, UA 0-5 0 - 5 WBC/hpf   Bacteria, UA RARE (A) NONE SEEN    Comment: Performed at [facility], [address] [place], [place] [address]  CBC     Status: Abnormal   Collection Time: 07/24/22 12:21 AM  Result Value Ref Range   WBC 12.0 (H) 4.0 - 10.5 K/uL   RBC 4.30 4.22 - 5.81 MIL/uL   Hemoglobin 11.3 (L) 13.0 - 17.0 g/dL   HCT 70.9 (L) 62.8 - 36.6 %   MCV 83.5 80.0 - 100.0 fL   MCH 26.3 26.0 - 34.0 pg   MCHC 31.5 30.0 - 36.0 g/dL   RDW 29.4 76.5 - 46.5 %   Platelets 289 150 - 400 K/uL   nRBC 0.0 0.0 - 0.2 %    Comment: Performed at [facility], [address] [place], [place] [address]  Basic metabolic panel     Status: Abnormal   Collection Time: 07/24/22 12:21 AM  Result Value Ref Range   Sodium 136 135 - 145 mmol/L   Potassium 4.0 3.5 - 5.1 mmol/L   Chloride 106 98 - 111 mmol/L   CO2 23 22 - 32 mmol/L   Glucose, Bld 309 (H) 70 - 99 mg/dL    Comment: Glucose  reference range applies only to samples taken after fasting for at least 8 hours.   BUN 9 6 - 20 mg/dL   Creatinine, Ser 8.29 0.61 - 1.24 mg/dL   Calcium 8.5 (L) 8.9 - 10.3 mg/dL   GFR, Estimated >93 >71 mL/min    Comment: (NOTE) Calculated using the CKD-EPI Creatinine Equation (2021)    Anion gap 7 5 - 15    Comment: Performed at [facility], [address] [place], [place] [address]  Glucose, capillary     Status: Abnormal   Collection Time: 07/24/22  8:03 AM  Result Value Ref Range   Glucose-Capillary 207 (H) 70 - 99 mg/dL    Comment: Glucose reference range applies only to samples taken after fasting for at least 8 hours.    MRI Left foot without contrast  Result Date: 07/23/2022 CLINICAL DATA:  Foot swelling, diabetic, osteomyelitis suspected. EXAM: MRI OF THE LEFT FOOT WITHOUT CONTRAST TECHNIQUE: Multiplanar, multisequence MR imaging of the left was performed. No intravenous contrast was administered. COMPARISON:  Radiograph performed earlier on the same date. FINDINGS: Bones/Joint/Cartilage Bone marrow edema of the fifth metatarsal head and proximal phalanx of the fifth digit with small joint effusion. There is also mild marrow edema of the fourth metatarsal head without evidence of joint effusion. There are subchondral cystic changes at the first metatarsophalangeal joint. Marrow signal within remaining osseous structures is within normal limits. Ligaments Lisfranc and collateral ligaments are intact. 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 Soft tissues Marked  subcutaneous soft tissue edema about the ankle and foot. There is a fluid collection about the dorsal aspect of the fourth and fifth metatarsals which extends into the space between the metatarsal heads measures at least 1.5 x 3.4 x 3.3 cm, suggesting abscess. IMPRESSION: 1. Bone marrow edema of the fifth metatarsal head and proximal phalanx of the fifth digit with small joint effusion, as well as marrow edema of the fourth metatarsal head. In the presence of adjacent fluid collection/abscess the findings are concerning for early osteomyelitis. 2. 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 3. Fluid collection about the dorsal aspect of the fourth and fifth metatarsal heads which extends into the space between the metatarsal heads concerning for abscess, measuring approximately 1.5 x 3.4 x 3.3 cm. Electronically Signed   By: Larose Hires D.O.   On: 07/23/2022 21:51   DG Chest Port 1 View  Result Date: 07/23/2022 CLINICAL DATA:  Sepsis EXAM: PORTABLE CHEST 1 VIEW COMPARISON:  12/27/2020 FINDINGS: The heart size and mediastinal contours are within normal limits. Both lungs are clear. The visualized skeletal structures are unremarkable. IMPRESSION: No active disease. Electronically Signed   By: Deatra Robinson M.D.   On: 07/23/2022 19:34    Review of Systems  Constitutional:  Positive for chills and fever. Negative for diaphoresis.  HENT:  Negative for ear discharge, ear pain, hearing loss and tinnitus.   Eyes:  Negative for photophobia and pain.  Respiratory:  Negative for cough and shortness of breath.   Cardiovascular:  Negative for chest pain.  Gastrointestinal:  Negative for abdominal pain, nausea and vomiting.  Genitourinary:  Negative for dysuria, flank pain, frequency and urgency.  Musculoskeletal:  Positive for arthralgias (Left foot). Negative for back pain, myalgias and neck pain.  Neurological:  Negative for dizziness and headaches.   Hematological:  Does not bruise/bleed easily.  Psychiatric/Behavioral:  The patient is not nervous/anxious.    Blood  pressure (!) 142/73, pulse 89, temperature 99.3 F (37.4 C), temperature source Oral, resp. rate 17, height 6' (1.829 m), weight 131.1 kg, SpO2 95 %. Physical Exam Constitutional:      General: He is not in acute distress.    Appearance: He is well-developed. He is not diaphoretic.  HENT:     Head: Normocephalic and atraumatic.  Eyes:     General: No scleral icterus.       Right eye: No discharge.        Left eye: No discharge.     Conjunctiva/sclera: Conjunctivae normal.  Cardiovascular:     Rate and Rhythm: Normal rate and regular rhythm.  Pulmonary:     Effort: Pulmonary effort is normal. No respiratory distress.  Musculoskeletal:     Cervical back: Normal range of motion.  Feet:     Comments: Left foot: 3+ NP edema, small ulceration lateral/dorsum over 5th MTP, mild TTP, 2+ DP, 0 PT, SPN/DPN/TN intact Skin:    General: Skin is warm and dry.  Neurological:     Mental Status: He is alert.  Psychiatric:        Mood and Affect: Mood normal.        Behavior: Behavior normal.     Assessment/Plan: Left foot ulceration -- Dr. Lajoyce Corners to evaluate later today or in AM. Will give diet today.    [name], PA-C Orthopedic Surgery [phone] 07/24/2022, 10:31 AM

## 2022-07-24 NOTE — Progress Notes (Signed)
PROGRESS NOTE    Aaron Franco  VOZ:[id number] DOB: Nov 23, 1970 DOA: 07/23/2022 PCP: Haydee Salter, MD   Brief Narrative:  Aaron Franco is a 51 y.o. male with medical history significant of  HTN, HLD, CAD s/p PCI, HfpEF, diabetes mellitus type 2, GERD, and obesity who presents with complaints of left foot infection.    Patient meets criteria for sepsis at intake, imaging concerning for fluid collection of the lateral foot with edema possible abscess versus osteomyelitis.    Assessment & Plan:   Principal Problem:   Diabetic foot infection ([place]) Active Problems:   Cellulitis and abscess of foot   Sepsis ([place])   Cardiac murmur   Type 2 diabetes mellitus with hyperglycemia, with long-term current use of insulin (HCC)   CAD S/P percutaneous coronary angioplasty -    Essential hypertension   Normocytic anemia   Dyslipidemia   Gastroesophageal reflux disease   History of gout   Obesity (BMI 30-39.9)   Sepsis secondary to cellulitis and abscess/rule out osteomyelitis, POA -Febrile, leukocytosis with notable inflammation of the left foot meeting sepsis criteria -MRI shows fluid collection with marrow edema of the fourth and fifth metatarsals concerning for early osteomyelitis -Cultures pending -Continue new cefepime and Flagyl -Orthopedics consulted for possible intervention -CRP and ESR elevated concerning for profound infection    Uncontrolled diabetes mellitus type 2, with hyperglycemia -A1c 13.4 -Lengthy discussion at bedside about need for dietary medication compliance given uncontrolled diabetes will limit wound healing and places patient at risk for future amputations and repeat infections -Home medication regimen includes Farxiga 5 mg daily and Tresiba 20 mg every 12 hours.  Patient currently does not have endocrinology follow-up, "in between offices" -Continue to titrate sliding scale insulin, hypoglycemic protocol   CAD s/p PCI -Continue aspirin and statin    Murmur -Check echocardiogram  Essential hypertension Continue home amlodipine, nebivolol  Hydralazine as needed  Normocytic anemia, likely chronic anemia of chronic disease Stable, follow repeat labs   Dyslipidemia -Continue Crestor   GERD -Continue pharmacy substitution of Protonix for Dexilant   History of gout Patient's symptoms suspected secondary to infection more so than gout at this time   Obesity BMI 38.52 kg/m  DVT prophylaxis: Early ambulation, SCDs Code Status: Full Family Communication: Wife at bedside  Status is: Inpatient  Dispo: The patient is from: Home              Anticipated d/c is to: Home              Anticipated d/c date is: 48 to 72 hours pending surgical course              Patient currently not medically stable for discharge  Consultants:  Orthopedic surgery  Procedures:  Pending above  Antimicrobials:  Cefepime, Flagyl  Subjective: No acute issues or events overnight denies nausea vomiting diarrhea constipation headache fevers chills or chest pain  Objective: Vitals:   07/23/22 2103 07/23/22 2130 07/23/22 2240 07/24/22 0335  BP:  135/82 (!) 144/91 137/83  Pulse:  93 95 94  Resp:  19 18   Temp: 100.2 F (37.9 C)  100.2 F (37.9 C) 99 F (37.2 C)  TempSrc: Oral  Oral Oral  SpO2:  100% 99% 98%  Weight:   131.1 kg   Height:   6' (1.829 m)     Intake/Output Summary (Last 24 hours) at 07/24/2022 0728 Last data filed at 07/24/2022 0650 Gross per 24 hour  Intake 1936.03 ml  Output --  Net 1936.03 ml   Filed Weights   07/23/22 1721 07/23/22 2240  Weight: 128.8 kg 131.1 kg    Examination:  General:  Pleasantly resting in bed, No acute distress. HEENT:  Normocephalic atraumatic.  Sclerae nonicteric, noninjected.  Extraocular movements intact bilaterally. Neck:  Without mass or deformity.  Trachea is midline. Lungs:  Clear to auscultate bilaterally without rhonchi, wheeze, or rales. Heart:  Regular rate and rhythm.  Without  murmurs, rubs, or gallops. Abdomen:  Soft, nontender, nondistended.  Without guarding or rebound. Vascular:  Dorsalis pedis and posterior tibial pulses palpable bilaterally. Skin: Left foot ulceration overlying the superior aspect of the fourth and fifth distal metatarsals with associated blanching erythema  Data Reviewed: I have personally reviewed following labs and imaging studies  CBC: Recent Labs  Lab 07/23/22 1758 07/24/22 0021  WBC 14.6* 12.0*  NEUTROABS 11.4*  --   HGB 12.7* 11.3*  HCT 41.2 35.9*  MCV 83.9 83.5  PLT 360 505   Basic Metabolic Panel: Recent Labs  Lab 07/23/22 1758 07/24/22 0021  NA 138 136  K 4.1 4.0  CL 102 106  CO2 24 23  GLUCOSE 239* 309*  BUN 12 9  CREATININE 0.90 0.80  CALCIUM 9.6 8.5*   GFR: Estimated Creatinine Clearance: 151.3 mL/min (by C-G formula based on SCr of 0.8 mg/dL). Liver Function Tests: Recent Labs  Lab 07/23/22 1758  AST 14*  ALT 14  ALKPHOS 91  BILITOT 0.7  PROT 8.7*  ALBUMIN 3.1*   No results for input(s): "LIPASE", "AMYLASE" in the last 168 hours. No results for input(s): "AMMONIA" in the last 168 hours. Coagulation Profile: Recent Labs  Lab 07/23/22 1758  INR 1.1   Cardiac Enzymes: No results for input(s): "CKTOTAL", "CKMB", "CKMBINDEX", "TROPONINI" in the last 168 hours. BNP (last 3 results) Recent Labs    03/18/22 1613 07/17/22 1130  PROBNP 28.0 34.0   HbA1C: Recent Labs    07/23/22 2111  HGBA1C 13.4*   CBG: Recent Labs  Lab 07/23/22 2144  GLUCAP 153*   Lipid Profile: No results for input(s): "CHOL", "HDL", "LDLCALC", "TRIG", "CHOLHDL", "LDLDIRECT" in the last 72 hours. Thyroid Function Tests: No results for input(s): "TSH", "T4TOTAL", "FREET4", "T3FREE", "THYROIDAB" in the last 72 hours. Anemia Panel: No results for input(s): "VITAMINB12", "FOLATE", "FERRITIN", "TIBC", "IRON", "RETICCTPCT" in the last 72 hours. Sepsis Labs: Recent Labs  Lab 07/23/22 1758 07/23/22 1921  LATICACIDVEN  1.1 1.4    Recent Results (from the past 240 hour(s))  Resp Panel by RT-PCR (Flu A&B, Covid) Anterior Nasal Swab     Status: None   Collection Time: 07/23/22  6:00 PM   Specimen: Anterior Nasal Swab  Result Value Ref Range Status   SARS Coronavirus 2 by RT PCR NEGATIVE NEGATIVE Final    Comment: (NOTE) SARS-CoV-2 target nucleic acids are NOT DETECTED.  The SARS-CoV-2 RNA is generally detectable in upper respiratory specimens during the acute phase of infection. The lowest concentration of SARS-CoV-2 viral copies this assay can detect is 138 copies/mL. A negative result does not preclude SARS-Cov-2 infection and should not be used as the sole basis for treatment or other patient management decisions. A negative result may occur with  improper specimen collection/handling, submission of specimen other than nasopharyngeal swab, presence of viral mutation(s) within the areas targeted by this assay, and inadequate number of viral copies(<138 copies/mL). A negative result must be combined with clinical observations, patient history, and epidemiological information. The expected result is Negative.  Fact Sheet for  Patients:  EntrepreneurPulse.com.au  Fact Sheet for Healthcare Providers:  IncredibleEmployment.be  This test is no t yet approved or cleared by the [place] FDA and  has been authorized for detection and/or diagnosis of SARS-CoV-2 by FDA under an Emergency Use Authorization (EUA). This EUA will remain  in effect (meaning this test can be used) for the duration of the COVID-19 declaration under Section 564(b)(1) of the Act, 21 U.S.C.section 360bbb-3(b)(1), unless the authorization is terminated  or revoked sooner.       Influenza A by PCR NEGATIVE NEGATIVE Final   Influenza B by PCR NEGATIVE NEGATIVE Final    Comment: (NOTE) The Xpert Xpress SARS-CoV-2/FLU/RSV plus assay is intended as an aid in the diagnosis of influenza from  Nasopharyngeal swab specimens and should not be used as a sole basis for treatment. Nasal washings and aspirates are unacceptable for Xpert Xpress SARS-CoV-2/FLU/RSV testing.  Fact Sheet for Patients: EntrepreneurPulse.com.au  Fact Sheet for Healthcare Providers: IncredibleEmployment.be  This test is not yet approved or cleared by the [place] FDA and has been authorized for detection and/or diagnosis of SARS-CoV-2 by FDA under an Emergency Use Authorization (EUA). This EUA will remain in effect (meaning this test can be used) for the duration of the COVID-19 declaration under Section 564(b)(1) of the Act, 21 U.S.C. section 360bbb-3(b)(1), unless the authorization is terminated or revoked.  Performed at [facility], [place] [address] [place], [place] [address]          Radiology Studies: MRI Left foot without contrast  Result Date: 07/23/2022 CLINICAL DATA:  Foot swelling, diabetic, osteomyelitis suspected. EXAM: MRI OF THE LEFT FOOT WITHOUT CONTRAST TECHNIQUE: Multiplanar, multisequence MR imaging of the left was performed. No intravenous contrast was administered. COMPARISON:  Radiograph performed earlier on the same date. FINDINGS: Bones/Joint/Cartilage Bone marrow edema of the fifth metatarsal head and proximal phalanx of the fifth digit with small joint effusion. There is also mild marrow edema of the fourth metatarsal head without evidence of joint effusion. There are subchondral cystic changes at the first metatarsophalangeal joint. Marrow signal within remaining osseous structures is within normal limits. Ligaments Lisfranc and collateral ligaments are intact. 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 Soft tissues Marked subcutaneous soft tissue edema about the ankle and foot. There is a fluid collection about the dorsal aspect of the fourth and fifth metatarsals  which extends into the space between the metatarsal heads measures at least 1.5 x 3.4 x 3.3 cm, suggesting abscess. IMPRESSION: 1. Bone marrow edema of the fifth metatarsal head and proximal phalanx of the fifth digit with small joint effusion, as well as marrow edema of the fourth metatarsal head. In the presence of adjacent fluid collection/abscess the findings are concerning for early osteomyelitis. 2. 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 3. Fluid collection about the dorsal aspect of the fourth and fifth metatarsal heads which extends into the space between the metatarsal heads concerning for abscess, measuring approximately 1.5 x 3.4 x 3.3 cm. Electronically Signed   By: Keane Police D.O.   On: 07/23/2022 21:51   DG Chest Port 1 View  Result Date: 07/23/2022 CLINICAL DATA:  Sepsis EXAM: PORTABLE CHEST 1 VIEW COMPARISON:  12/27/2020 FINDINGS: The heart size and mediastinal contours are within normal limits. Both lungs are clear. The visualized skeletal structures are unremarkable. IMPRESSION: No active disease. Electronically Signed   By: Ulyses Jarred M.D.   On: 07/23/2022 19:34  Scheduled Meds:  amLODipine  10 mg Oral Daily   aspirin  81 mg Oral Q1200   dapagliflozin propanediol  5 mg Oral Q1200   insulin aspart  0-15 Units Subcutaneous TID WC   insulin aspart  0-5 Units Subcutaneous QHS   nebivolol  5 mg Oral QHS   pantoprazole  40 mg Oral Daily   rosuvastatin  40 mg Oral QHS   sodium chloride flush  3 mL Intravenous Q12H   Continuous Infusions:  ceFEPime (MAXIPIME) IV Stopped (07/24/22 0543)   lactated ringers 150 mL/hr at 07/24/22 0328   metronidazole 500 mg (07/24/22 0602)     LOS: 1 day   Time spent: 58mn  Ola Raap C Tyronica [place], DO [facility]  If 7PM-7AM, please contact night-coverage [email or web address]  07/24/2022, 7:28 AM

## 2022-07-24 NOTE — Discharge Instructions (Signed)
It was nice speaking with you regarding diabetes.   Goals as discussed: Check blood sugars atleast 3 times per day (fasting, with lunch and after dinner) Take medications as prescribed Follow up with primary doctor and endocrinology Eliminate sugary beverages  Begin then working on: Estimating carbohydrates with meals ~50 grams per meal or 2-3 servings (palm of hand) Break down per meal & snacks Incorporating protein into snacks/diet  ALWAYS reach out per patient poral or by phone if blood sugars: - consistently elevated above 180 mg/dL - having more than one low a week ( <70 mg/dL)  [facility] should reach out to you regarding referral placement- for education (place with Dr Daune Perch visit)  GLPs- Trulicity, Ozempic or Rybelsus is something to mention to primary doctor or endocrinology if not resumed at discharge  See list below of outpatient endocrinology  Local Endocrinologists [facility] [phone]) Dr. Carlus Pavlov [facility] [phone]) Dr. Talmage Coin [facility] [phone]) Dr. Dorisann Frames Dr. Darci Needle [facility] [phone]- [phone]) Dr. Deirdre Pippins [facility] [phone]) [[place] office]  [phone]) Dan Humphreys office] Dr. Wendall Mola Dr. Verdis Frederickson [facility]) [phone]) Autumn Pearletha Forge Yetta Barre), PA Dr. Izell Leslie Dr. Jillyn Ledger. [facility] [phone]) Dr. Marquis Lunch [facility] [phone]) Dr. Jerelyn Scott Dr. Dessa Phi Dr. Gerome Sam, FNP Dr. Girtha Hake. [facility] in [place] [phone])  Also look at Dr Clementeen Graham in [place], [place]   ________________________________________________________________________________________  Carbohydrate Counting For People With Diabetes  Foods with  carbohydrates make your blood glucose level go up. Learning how to count carbohydrates can help you control your blood glucose levels. First, identify the foods you eat that contain carbohydrates. Then, using the Foods with Carbohydrates chart, determine about how much carbohydrates are in your meals and snacks. Make sure you are eating foods with fiber, protein, and healthy fat along with your carbohydrate foods.  Foods with Carbohydrates The following table shows carbohydrate foods that have about 15 grams of carbohydrate each. Using measuring cups, spoons, or a food scale when you first begin learning about carbohydrate counting can help you learn about the portion sizes you typically eat. The following foods have 15 grams carbohydrate each:  Grains 1 slice bread (1 ounce)  1 small tortilla (6-inch size)   large bagel (1 ounce)  1/3 cup pasta or rice (cooked)   hamburger or hot dog bun ( ounce)   cup cooked cereal   to  cup ready-to-eat cereal  2 taco shells (5-inch size) Fruit 1 small fresh fruit ( to 1 cup)   medium banana  17 small grapes (3 ounces)  1 cup melon or berries   cup canned or frozen fruit  2 tablespoons dried fruit (blueberries, cherries, cranberries, raisins)   cup unsweetened fruit juice  Starchy Vegetables  cup cooked beans, peas, corn, potatoes/sweet potatoes   large baked potato (3 ounces)  1 cup acorn or butternut squash  Snack Foods 3 to 6 crackers  8 potato chips or 13 tortilla chips ( ounce to 1 ounce)  3 cups popped popcorn  Dairy 3/4 cup (6 ounces) nonfat plain yogurt, or yogurt with sugar-free sweetener  1 cup milk  1 cup plain rice, soy, coconut or flavored almond milk Sweets and Desserts  cup ice cream or frozen yogurt  1 tablespoon jam, jelly, pancake syrup, table sugar, or honey  2 tablespoons light pancake syrup  1  inch square of frosted cake or 2 inch square of unfrosted cake  2 small cookies (2/3 ounce each) or  large cookie    Sometimes you'll have to estimate carbohydrate amounts if you don't know the exact recipe. One cup of mixed foods like soups can have 1 to 2 carbohydrate servings, while some casseroles might have 2 or more servings of carbohydrate. Foods that have less than 20 calories in each serving can be counted as "free" foods. Count 1 cup raw vegetables, or  cup cooked non-starchy vegetables as "free" foods. If you eat 3 or more servings at one meal, then count them as 1 carbohydrate serving.   Foods without Carbohydrates  Not all foods contain carbohydrates. Meat, some dairy, fats, non-starchy vegetables, and many beverages don't contain carbohydrate. So when you count carbohydrates, you can generally exclude chicken, pork, beef, fish, seafood, eggs, tofu, cheese, butter, sour cream, avocado, nuts, seeds, olives, mayonnaise, water, black coffee, unsweetened tea, and zero-calorie drinks. Vegetables with no or low carbohydrate include green beans, cauliflower, tomatoes, and onions.  How much carbohydrate should I eat at each meal?  Carbohydrate counting can help you plan your meals and manage your weight. Following are some starting points for carbohydrate intake at each meal. Work with your registered dietitian nutritionist to find the best range that works for your blood glucose and weight.    To Lose Weight To Maintain Weight  Women 2 - 3 carb servings 3 - 4 carb servings  Men 3 - 4 carb servings 4 - 5 carb servings  Checking your blood glucose after meals will help you know if you need to adjust the timing, type, or number of carbohydrate servings in your meal plan. Achieve and keep a healthy body weight by balancing your food intake and physical activity.  Tips How should I plan my meals?  Plan for half the food on your plate to include non-starchy vegetables, like salad greens, broccoli, or carrots. Try to eat 3 to 5 servings of non-starchy vegetables every day. Have a protein food at each meal.  Protein foods include chicken, fish, meat, eggs, or beans (note that beans contain carbohydrate). These two food groups (non-starchy vegetables and proteins) are low in carbohydrate. If you fill up your plate with these foods, you will eat less carbohydrate but still fill up your stomach. Try to limit your carbohydrate portion to  of the plate.   What fats are healthiest to eat?  Diabetes increases risk for heart disease. To help protect your heart, eat more healthy fats, such as olive oil, nuts, and avocado. Eat less saturated fats like butter, cream, and high-fat meats, like bacon and sausage. Avoid trans fats, which are in all foods that list "partially hydrogenated oil" as an ingredient.  What should I drink?  Choose drinks that are not sweetened with sugar. The healthiest choices are water, carbonated or seltzer waters, and tea and coffee without added sugars.  Sweet drinks will make your blood glucose go up very quickly. One serving of soda or energy drink is  cup. It is best to drink these beverages only if your blood glucose is low.  Artificially sweetened, or diet drinks, typically do not increase your blood glucose if they have zero calories in them. Read labels of beverages, as some diet drinks do have carbohydrate and will raise your blood glucose.  Label Reading Tips Read Nutrition Facts labels to find out how many grams of carbohydrate are in a food you want  to eat. Don't forget: sometimes serving sizes on the label aren't the same as how much food you are going to eat, so you may need to calculate how much carbohydrate is in the food you are serving yourself.   Carbohydrate Counting for People with Diabetes Sample 1-Day Menu  Breakfast  cup yogurt, low fat, low sugar (1 carbohydrate serving)   cup cereal, ready-to-eat, unsweetened (1 carbohydrate serving)  1 cup strawberries (1 carbohydrate serving)   cup almonds ( carbohydrate serving)  Lunch 1, 5 ounce can chunk light tuna   2 ounces cheese, low fat cheddar  6 whole wheat crackers (1 carbohydrate serving)  1 small apple (1 carbohydrate servings)   cup carrots ( carbohydrate serving)   cup snap peas  1 cup 1% milk (1 carbohydrate serving)   Evening Meal Stir fry made with: 3 ounces chicken  1 cup brown rice (3 carbohydrate servings)   cup broccoli ( carbohydrate serving)   cup green beans   cup onions  1 tablespoon olive oil  2 tablespoons teriyaki sauce ( carbohydrate serving)  Evening Snack 1 extra small banana (1 carbohydrate serving)  1 tablespoon peanut butter   Carbohydrate Counting for People with Diabetes Vegetarian (Lacto-Ovo) Sample 1-Day Menu  Breakfast 1 cup cooked oatmeal (2 carbohydrate servings)   cup blueberries (1 carbohydrate serving)  2 tablespoons flaxseeds  1 egg  1 cup 1% milk (1 carbohydrate serving)  1 cup coffee  Lunch 2 slices whole wheat bread (2 carbohydrate servings)  2 ounces low-fat cheese   cup lettuce  2 slices tomato  2 slices avocado   cup baby carrots ( carbohydrate serving)  1 orange (1 carbohydrate serving)  1 cup unsweetened tea  Evening Meal Burrito made with: 1 6-inch corn tortilla (1 carbohydrate serving)   cup refried vegetarian beans (1 carbohydrate serving)   cup tomatoes   cup lettuce   cup salsa  1/3 cup brown rice (1 carbohydrate serving)  1 tablespoon olive oil for rice   cup zucchini  1 cup 1% milk (1 carbohydrate serving)  Evening Snack 6 small whole grain crackers (1 carbohydrate serving)  2 apricots ( carbohydrate serving)   cup unsalted peanuts ( carbohydrate serving)    Copyright 2020  Academy of Nutrition and Dietetics. All rights reserved.  Using Nutrition Labels: Carbohydrate  Serving Size  Look at the serving size. All the information on the label is based on this portion. Servings Per Container  The number of servings contained in the package. Guidelines for Carbohydrate  Look at the total grams of  carbohydrate in the serving size.  1 carbohydrate choice = 15 grams of carbohydrate. Range of Carbohydrate Grams Per Choice  Carbohydrate Grams/Choice Carbohydrate Choices  6-10   11-20 1  21-25 1  26-35 2  36-40 2  41-50 3  51-55 3  56-65 4  66-70 4  71-80 5    Copyright 2020  Academy of Nutrition and Dietetics. All rights reserved.

## 2022-07-25 ENCOUNTER — Encounter (HOSPITAL_COMMUNITY)
Admission: EM | Disposition: A | Discharge status: Home Health | Payer: Self-pay | Source: Home / Self Care | Attending: Internal Medicine

## 2022-07-25 ENCOUNTER — Encounter (HOSPITAL_COMMUNITY): Payer: Self-pay | Admitting: Internal Medicine

## 2022-07-25 ENCOUNTER — Inpatient Hospital Stay (HOSPITAL_COMMUNITY): Payer: BC Managed Care – PPO | Admitting: Registered Nurse

## 2022-07-25 ENCOUNTER — Other Ambulatory Visit: Payer: Self-pay

## 2022-07-25 DIAGNOSIS — E785 Hyperlipidemia, unspecified: Secondary | ICD-10-CM | POA: Diagnosis not present

## 2022-07-25 DIAGNOSIS — E11628 Type 2 diabetes mellitus with other skin complications: Secondary | ICD-10-CM | POA: Diagnosis not present

## 2022-07-25 DIAGNOSIS — A419 Sepsis, unspecified organism: Secondary | ICD-10-CM | POA: Diagnosis not present

## 2022-07-25 DIAGNOSIS — Z794 Long term (current) use of insulin: Secondary | ICD-10-CM | POA: Diagnosis not present

## 2022-07-25 DIAGNOSIS — M869 Osteomyelitis, unspecified: Secondary | ICD-10-CM | POA: Diagnosis not present

## 2022-07-25 DIAGNOSIS — I252 Old myocardial infarction: Secondary | ICD-10-CM | POA: Diagnosis not present

## 2022-07-25 DIAGNOSIS — E1140 Type 2 diabetes mellitus with diabetic neuropathy, unspecified: Secondary | ICD-10-CM

## 2022-07-25 DIAGNOSIS — L03119 Cellulitis of unspecified part of limb: Secondary | ICD-10-CM | POA: Diagnosis not present

## 2022-07-25 DIAGNOSIS — M86172 Other acute osteomyelitis, left ankle and foot: Secondary | ICD-10-CM

## 2022-07-25 DIAGNOSIS — E7849 Other hyperlipidemia: Secondary | ICD-10-CM

## 2022-07-25 DIAGNOSIS — L089 Local infection of the skin and subcutaneous tissue, unspecified: Secondary | ICD-10-CM | POA: Diagnosis not present

## 2022-07-25 DIAGNOSIS — E1169 Type 2 diabetes mellitus with other specified complication: Secondary | ICD-10-CM | POA: Diagnosis not present

## 2022-07-25 DIAGNOSIS — L02612 Cutaneous abscess of left foot: Secondary | ICD-10-CM

## 2022-07-25 HISTORY — PX: AMPUTATION: SHX166

## 2022-07-25 LAB — BASIC METABOLIC PANEL
Anion gap: 10 (ref 5–15)
BUN: 11 mg/dL (ref 6–20)
CO2: 22 mmol/L (ref 22–32)
Calcium: 8.7 mg/dL — ABNORMAL LOW (ref 8.9–10.3)
Chloride: 102 mmol/L (ref 98–111)
Creatinine, Ser: 0.78 mg/dL (ref 0.61–1.24)
GFR, Estimated: >60 mL/min (ref >60)
Glucose, Bld: 178 mg/dL — ABNORMAL HIGH (ref 70–99)
Potassium: 4 mmol/L (ref 3.5–5.1)
Sodium: 134 mmol/L — ABNORMAL LOW (ref 135–145)

## 2022-07-25 LAB — GLUCOSE, CAPILLARY
Glucose-Capillary: 188 mg/dL — ABNORMAL HIGH (ref 70–99)
Glucose-Capillary: 187 mg/dL — ABNORMAL HIGH (ref 70–99)
Glucose-Capillary: 203 mg/dL — ABNORMAL HIGH (ref 70–99)
Glucose-Capillary: 200 mg/dL — ABNORMAL HIGH (ref 70–99)
Glucose-Capillary: 203 mg/dL — ABNORMAL HIGH (ref 70–99)

## 2022-07-25 LAB — SURGICAL PCR SCREEN
MRSA, PCR: NEGATIVE
Staphylococcus aureus: NEGATIVE

## 2022-07-25 LAB — CBC
HCT: 34.8 % — ABNORMAL LOW (ref 39.0–52.0)
Hemoglobin: 11.1 g/dL — ABNORMAL LOW (ref 13.0–17.0)
MCH: 25.8 pg — ABNORMAL LOW (ref 26.0–34.0)
MCHC: 31.9 g/dL (ref 30.0–36.0)
MCV: 80.9 fL (ref 80.0–100.0)
Platelets: 294 10*3/uL (ref 150–400)
RBC: 4.3 MIL/uL (ref 4.22–5.81)
RDW: 13.4 % (ref 11.5–15.5)
WBC: 12.2 10*3/uL — ABNORMAL HIGH (ref 4.0–10.5)
nRBC: 0 % (ref 0.0–0.2)

## 2022-07-25 SURGERY — AMPUTATION, FOOT, RAY
Anesthesia: Regional | Site: Foot | Laterality: Left

## 2022-07-25 MED ORDER — FENTANYL CITRATE (PF) 100 MCG/2ML IJ SOLN
INTRAMUSCULAR | Status: AC
  Filled 2022-07-25: qty 2

## 2022-07-25 MED ORDER — POVIDONE-IODINE 10 % EX SWAB
2.0000 | Freq: Once | CUTANEOUS | Status: AC
  Administered 2022-07-25: 2 via TOPICAL

## 2022-07-25 MED ORDER — OXYCODONE HCL 5 MG PO TABS
5.0000 mg | ORAL_TABLET | ORAL | Status: DC | PRN
  Administered 2022-07-25: 10 mg via ORAL
  Administered 2022-07-25 – 2022-07-26 (×3): 5 mg via ORAL
  Administered 2022-07-26: 10 mg via ORAL
  Administered 2022-07-27: 5 mg via ORAL
  Administered 2022-07-27: 10 mg via ORAL
  Administered 2022-07-27 – 2022-07-28 (×2): 5 mg via ORAL
  Administered 2022-07-28: 10 mg via ORAL
  Filled 2022-07-25 (×4): qty 1
  Filled 2022-07-25 (×4): qty 2
  Filled 2022-07-25 (×3): qty 1

## 2022-07-25 MED ORDER — HYDROMORPHONE HCL 1 MG/ML IJ SOLN
0.5000 mg | INTRAMUSCULAR | Status: DC | PRN
  Administered 2022-07-28: 1 mg via INTRAVENOUS
  Filled 2022-07-25: qty 1

## 2022-07-25 MED ORDER — AMISULPRIDE (ANTIEMETIC) 5 MG/2ML IV SOLN: 10.0000 mg | Freq: Once | INTRAVENOUS | Status: DC | PRN

## 2022-07-25 MED ORDER — METOPROLOL TARTRATE 5 MG/5ML IV SOLN: 2.0000 mg | INTRAVENOUS | Status: DC | PRN

## 2022-07-25 MED ORDER — LABETALOL HCL 5 MG/ML IV SOLN: 10.0000 mg | INTRAVENOUS | Status: DC | PRN

## 2022-07-25 MED ORDER — PANTOPRAZOLE SODIUM 40 MG PO TBEC
40.0000 mg | DELAYED_RELEASE_TABLET | Freq: Every day | ORAL | Status: DC
  Administered 2022-07-26 – 2022-07-28 (×3): 40 mg via ORAL
  Filled 2022-07-25 (×3): qty 1

## 2022-07-25 MED ORDER — INSULIN ASPART 100 UNIT/ML IJ SOLN
0.0000 [IU] | INTRAMUSCULAR | Status: DC | PRN
  Administered 2022-07-25: 4 [IU] via SUBCUTANEOUS
  Filled 2022-07-25: qty 1

## 2022-07-25 MED ORDER — DOCUSATE SODIUM 100 MG PO CAPS
100.0000 mg | ORAL_CAPSULE | Freq: Every day | ORAL | Status: DC
  Administered 2022-07-26 – 2022-07-28 (×3): 100 mg via ORAL
  Filled 2022-07-25 (×3): qty 1

## 2022-07-25 MED ORDER — MIDAZOLAM HCL 2 MG/2ML IJ SOLN
INTRAMUSCULAR | Status: AC
  Filled 2022-07-25: qty 2

## 2022-07-25 MED ORDER — HYDRALAZINE HCL 20 MG/ML IJ SOLN: 5.0000 mg | INTRAMUSCULAR | Status: DC | PRN

## 2022-07-25 MED ORDER — REPATHA SURECLICK 140 MG/ML ~~LOC~~ SOAJ: 140.0000 mL | SUBCUTANEOUS | 3 refills | Status: DC

## 2022-07-25 MED ORDER — 0.9 % SODIUM CHLORIDE (POUR BTL) OPTIME
TOPICAL | Status: DC | PRN
  Administered 2022-07-25: 1000 mL

## 2022-07-25 MED ORDER — VITAMIN C 500 MG PO TABS
1000.0000 mg | ORAL_TABLET | Freq: Every day | ORAL | Status: DC
  Administered 2022-07-26 – 2022-07-28 (×3): 1000 mg via ORAL
  Filled 2022-07-25 (×3): qty 2

## 2022-07-25 MED ORDER — MIDAZOLAM HCL 2 MG/2ML IJ SOLN: 2.0000 mg | Freq: Once | INTRAMUSCULAR | Status: AC

## 2022-07-25 MED ORDER — ORAL CARE MOUTH RINSE: 15.0000 mL | Freq: Once | OROMUCOSAL | Status: AC

## 2022-07-25 MED ORDER — PROPOFOL 10 MG/ML IV BOLUS
INTRAVENOUS | Status: AC
  Filled 2022-07-25: qty 20

## 2022-07-25 MED ORDER — ROCURONIUM BROMIDE 10 MG/ML (PF) SYRINGE
PREFILLED_SYRINGE | INTRAVENOUS | Status: AC
  Filled 2022-07-25: qty 10

## 2022-07-25 MED ORDER — ZINC SULFATE 220 (50 ZN) MG PO CAPS
220.0000 mg | ORAL_CAPSULE | Freq: Every day | ORAL | Status: DC
  Administered 2022-07-26 – 2022-07-28 (×3): 220 mg via ORAL
  Filled 2022-07-25 (×3): qty 1

## 2022-07-25 MED ORDER — LIDOCAINE 2% (20 MG/ML) 5 ML SYRINGE
INTRAMUSCULAR | Status: AC
  Filled 2022-07-25: qty 20

## 2022-07-25 MED ORDER — OXYCODONE HCL 5 MG PO TABS: 5.0000 mg | ORAL_TABLET | Freq: Once | ORAL | Status: DC | PRN

## 2022-07-25 MED ORDER — BISACODYL 5 MG PO TBEC: 5.0000 mg | DELAYED_RELEASE_TABLET | Freq: Every day | ORAL | Status: DC | PRN

## 2022-07-25 MED ORDER — POLYETHYLENE GLYCOL 3350 17 G PO PACK: 17.0000 g | PACK | Freq: Every day | ORAL | Status: DC | PRN

## 2022-07-25 MED ORDER — JUVEN PO PACK
1.0000 | PACK | Freq: Two times a day (BID) | ORAL | Status: DC
  Administered 2022-07-25 – 2022-07-28 (×7): 1 via ORAL
  Filled 2022-07-25 (×7): qty 1

## 2022-07-25 MED ORDER — CHLORHEXIDINE GLUCONATE 0.12 % MT SOLN
OROMUCOSAL | Status: AC
  Administered 2022-07-25: 15 mL via OROMUCOSAL
  Filled 2022-07-25: qty 15

## 2022-07-25 MED ORDER — MIDAZOLAM HCL 2 MG/2ML IJ SOLN
INTRAMUSCULAR | Status: AC
  Administered 2022-07-25: 2 mg via INTRAVENOUS
  Filled 2022-07-25: qty 2

## 2022-07-25 MED ORDER — ACETAMINOPHEN 325 MG PO TABS
325.0000 mg | ORAL_TABLET | Freq: Four times a day (QID) | ORAL | Status: DC | PRN
  Administered 2022-07-27: 650 mg via ORAL
  Filled 2022-07-25: qty 2

## 2022-07-25 MED ORDER — CHLORHEXIDINE GLUCONATE 0.12 % MT SOLN: 15.0000 mL | Freq: Once | OROMUCOSAL | Status: AC

## 2022-07-25 MED ORDER — LACTATED RINGERS IV SOLN: INTRAVENOUS | Status: DC

## 2022-07-25 MED ORDER — SODIUM CHLORIDE 0.9 % IV SOLN: INTRAVENOUS | Status: DC

## 2022-07-25 MED ORDER — PROPOFOL 500 MG/50ML IV EMUL
INTRAVENOUS | Status: DC | PRN
  Administered 2022-07-25: 100 ug/kg/min via INTRAVENOUS

## 2022-07-25 MED ORDER — ALUM & MAG HYDROXIDE-SIMETH 200-200-20 MG/5ML PO SUSP: 15.0000 mL | ORAL | Status: DC | PRN

## 2022-07-25 MED ORDER — BUPIVACAINE-EPINEPHRINE (PF) 0.5% -1:200000 IJ SOLN
INTRAMUSCULAR | Status: DC | PRN
  Administered 2022-07-25: 30 mL via PERINEURAL

## 2022-07-25 MED ORDER — ONDANSETRON HCL 4 MG/2ML IJ SOLN
INTRAMUSCULAR | Status: DC | PRN
  Administered 2022-07-25: 4 mg via INTRAVENOUS

## 2022-07-25 MED ORDER — PHENOL 1.4 % MT LIQD: 1.0000 | OROMUCOSAL | Status: DC | PRN

## 2022-07-25 MED ORDER — PROMETHAZINE HCL 25 MG/ML IJ SOLN: 6.2500 mg | INTRAMUSCULAR | Status: DC | PRN

## 2022-07-25 MED ORDER — KETOROLAC TROMETHAMINE 30 MG/ML IJ SOLN: 30.0000 mg | Freq: Once | INTRAMUSCULAR | Status: DC

## 2022-07-25 MED ORDER — MAGNESIUM SULFATE 2 GM/50ML IV SOLN: 2.0000 g | Freq: Every day | INTRAVENOUS | Status: DC | PRN

## 2022-07-25 MED ORDER — OXYCODONE HCL 5 MG/5ML PO SOLN: 5.0000 mg | Freq: Once | ORAL | Status: DC | PRN

## 2022-07-25 MED ORDER — CEFAZOLIN IN SODIUM CHLORIDE 3-0.9 GM/100ML-% IV SOLN
3.0000 g | INTRAVENOUS | Status: AC
  Administered 2022-07-25: 3 g via INTRAVENOUS

## 2022-07-25 MED ORDER — FENTANYL CITRATE (PF) 100 MCG/2ML IJ SOLN: 25.0000 ug | INTRAMUSCULAR | Status: DC | PRN

## 2022-07-25 MED ORDER — MAGNESIUM CITRATE PO SOLN: 1.0000 | Freq: Once | ORAL | Status: DC | PRN

## 2022-07-25 MED ORDER — ONDANSETRON HCL 4 MG/2ML IJ SOLN: 4.0000 mg | Freq: Four times a day (QID) | INTRAMUSCULAR | Status: DC | PRN

## 2022-07-25 MED ORDER — CHLORHEXIDINE GLUCONATE 4 % EX LIQD: 60.0000 mL | Freq: Once | CUTANEOUS | Status: DC

## 2022-07-25 MED ORDER — GUAIFENESIN-DM 100-10 MG/5ML PO SYRP: 15.0000 mL | ORAL_SOLUTION | ORAL | Status: DC | PRN

## 2022-07-25 MED ORDER — CEFAZOLIN IN SODIUM CHLORIDE 3-0.9 GM/100ML-% IV SOLN
INTRAVENOUS | Status: AC
  Filled 2022-07-25: qty 100

## 2022-07-25 MED ORDER — OXYCODONE HCL 5 MG PO TABS: 10.0000 mg | ORAL_TABLET | ORAL | Status: DC | PRN

## 2022-07-25 MED ORDER — POTASSIUM CHLORIDE CRYS ER 20 MEQ PO TBCR: 20.0000 meq | EXTENDED_RELEASE_TABLET | Freq: Every day | ORAL | Status: DC | PRN

## 2022-07-25 MED ORDER — ACETAMINOPHEN 10 MG/ML IV SOLN: 1000.0000 mg | Freq: Once | INTRAVENOUS | Status: DC | PRN

## 2022-07-25 SURGICAL SUPPLY — 38 items
BAG COUNTER SPONGE SURGICOUNT (BAG) ×1 IMPLANT
BLADE AVERAGE 25X9 (BLADE) IMPLANT
BLADE SAW SGTL MED 73X18.5 STR (BLADE) IMPLANT
BLADE SURG 21 STRL SS (BLADE) ×1 IMPLANT
BNDG COHESIVE 4X5 TAN STRL (GAUZE/BANDAGES/DRESSINGS) ×1 IMPLANT
BNDG COHESIVE 6X5 TAN NS LF (GAUZE/BANDAGES/DRESSINGS) IMPLANT
BNDG GAUZE DERMACEA FLUFF 4 (GAUZE/BANDAGES/DRESSINGS) ×1 IMPLANT
COVER SURGICAL LIGHT HANDLE (MISCELLANEOUS) ×2 IMPLANT
DRAPE DERMATAC (DRAPES) IMPLANT
DRAPE U-SHAPE 47X51 STRL (DRAPES) ×2 IMPLANT
DRESSING PEEL AND PLC PRVNA 13 (GAUZE/BANDAGES/DRESSINGS) IMPLANT
DRSG ADAPTIC 3X8 NADH LF (GAUZE/BANDAGES/DRESSINGS) ×1 IMPLANT
DRSG PEEL AND PLACE PREVENA 13 (GAUZE/BANDAGES/DRESSINGS) ×1
DURAPREP 26ML APPLICATOR (WOUND CARE) ×1 IMPLANT
ELECT REM PT RETURN 9FT ADLT (ELECTROSURGICAL) ×1
ELECTRODE REM PT RTRN 9FT ADLT (ELECTROSURGICAL) ×1 IMPLANT
GAUZE PAD ABD 8X10 STRL (GAUZE/BANDAGES/DRESSINGS) ×2 IMPLANT
GAUZE SPONGE 4X4 12PLY STRL (GAUZE/BANDAGES/DRESSINGS) ×1 IMPLANT
GLOVE BIOGEL PI IND STRL 9 (GLOVE) ×1 IMPLANT
GLOVE SS PI 9.0 STRL (GLOVE) IMPLANT
GLOVE SURG ORTHO 9.0 STRL STRW (GLOVE) ×1 IMPLANT
GLOVE SURG SS PI 7.0 STRL IVOR (GLOVE) IMPLANT
GOWN STRL REUS W/ TWL XL LVL3 (GOWN DISPOSABLE) ×2 IMPLANT
GOWN STRL REUS W/TWL XL LVL3 (GOWN DISPOSABLE) ×2
GRAFT SKIN WND MICRO 38 (Tissue) IMPLANT
KIT BASIN OR (CUSTOM PROCEDURE TRAY) ×1 IMPLANT
KIT DRSG PREVENA PLUS 7DAY 125 (MISCELLANEOUS) IMPLANT
KIT TURNOVER KIT B (KITS) ×1 IMPLANT
NS IRRIG 1000ML POUR BTL (IV SOLUTION) ×1 IMPLANT
PACK ORTHO EXTREMITY (CUSTOM PROCEDURE TRAY) ×1 IMPLANT
PAD ARMBOARD 7.5X6 YLW CONV (MISCELLANEOUS) ×2 IMPLANT
STOCKINETTE IMPERVIOUS LG (DRAPES) IMPLANT
SUT ETHILON 2 0 PSLX (SUTURE) ×1 IMPLANT
SUT SILK 2 0 (SUTURE) ×1
SUT SILK 2-0 18XBRD TIE 12 (SUTURE) IMPLANT
TOWEL GREEN STERILE (TOWEL DISPOSABLE) ×1 IMPLANT
TUBE CONNECTING 12X1/4 (SUCTIONS) ×1 IMPLANT
YANKAUER SUCT BULB TIP NO VENT (SUCTIONS) ×1 IMPLANT

## 2022-07-25 NOTE — Telephone Encounter (Signed)
Faxed additional chart notes to [employer] at [phone] per fax request on 07/17/22

## 2022-07-25 NOTE — Progress Notes (Signed)
Pt received back from PACU after surgery, alert and oriented x4. No c/o pain at present. Wound VAC on and operating at 125.

## 2022-07-25 NOTE — Progress Notes (Signed)
Pt states he only take Norvasc 5 mg at home and only wanted to take 5 mg here.

## 2022-07-25 NOTE — Progress Notes (Signed)
Orthopedic Tech Progress Note Patient Details:  Aaron Franco [birth year] [id number]  Ortho Devices Type of Ortho Device: Postop shoe/boot Ortho Device/Splint Location: LLE Ortho Device/Splint Interventions: Ordered   Post Interventions Instructions Provided: Adjustment of device, Care of device Shoe fitted and dropped off in room with patient. Darleen Crocker 07/25/2022, 2:29 PM

## 2022-07-25 NOTE — Op Note (Signed)
07/25/2022  1:20 PM  PATIENT:  Aaron Franco    PRE-OPERATIVE DIAGNOSIS:  Osteomyelitis 4th and 5th Metatarsal  POST-OPERATIVE DIAGNOSIS: Osteomyelitis fourth and fifth metatarsals with an abscess that extended dorsally over the entire midfoot.  PROCEDURE: Left transmetatarsal amputation. SURGEON:  Nadara Mustard, MD  PHYSICIAN ASSISTANT:None ANESTHESIA:   General  PREOPERATIVE INDICATIONS:  [name] is a  51 y.o. male with a diagnosis of Osteomyelitis 4th and 5th Metatarsal who failed conservative measures and elected for surgical management.    The risks benefits and alternatives were discussed with the patient preoperatively including but not limited to the risks of infection, bleeding, nerve injury, cardiopulmonary complications, the need for revision surgery, among others, and the patient was willing to proceed.  OPERATIVE IMPLANTS: Kerecis micro powder 38 cm.  @ENCIMAGES @  OPERATIVE FINDINGS: Patient had necrotic tissue and large abscess.  The tissue and abscess was sent for tissue cultures.  Margins were grossly viable.  OPERATIVE PROCEDURE: Patient was brought the operating room underwent general anesthetic.  After adequate levels anesthesia obtained patient's left lower extremity was prepped using DuraPrep draped into a sterile field a timeout was called.  A fishmouth incision was made just proximal to the abscess necrotic tissue a large posterior flap was created.  Patient had abscess necrotic tissue and this was sent for cultures.  A oscillating saw was used to perform a transmetatarsal amputation.  Electrocautery was used for hemostasis the wound was irrigated with normal saline and further debridement was performed the margins were grossly viable.  The 38 cm Kerecis micro powder was applied the wound was closed using 2-0 nylon the 13 cm Prevena wound VAC was applied this had a good suction fit patient had massive swelling in his leg and his leg and foot were wrapped  from the tibial tubercle distally.  Patient was taken the PACU in stable condition.   DISCHARGE PLANNING:  Antibiotic duration: Would continue the IV antibiotics for 72 hours and then plan on discharge with 3 weeks of oral antibiotics based on culture sensitivities.  Weightbearing: Nonweightbearing on the left  Pain medication: Opioid pathway  Dressing care/ Wound VAC: Discharge with the Praveena plus portable wound VAC pump.  Ambulatory devices: Walker or crutches  Discharge to: Anticipate discharge to home.  Follow-up: In the office 1 week post operative.

## 2022-07-25 NOTE — Anesthesia Procedure Notes (Signed)
Anesthesia Regional Block: Popliteal block   Pre-Anesthetic Checklist: , timeout performed,  Correct Patient, Correct Site, Correct Laterality,  Correct Procedure, Correct Position, site marked,  Risks and benefits discussed,  Surgical consent,  Pre-op evaluation,  At surgeon's request and post-op pain management  Laterality: Left  Prep: chloraprep       Needles:  Injection technique: Single-shot  Needle Type: Echogenic Stimulator Needle     Needle Length: 10cm  Needle Gauge: 20     Additional Needles:   Procedures:,,,, ultrasound used (permanent image in chart),,    Narrative:  Start time: 07/25/2022 11:30 AM End time: 07/25/2022 11:40 AM Injection made incrementally with aspirations every 5 mL.  Performed by: Personally  Anesthesiologist: Leonides Grills, MD  Additional Notes: Functioning IV was confirmed and monitors were applied.  A timeout was performed. Sterile prep, hand hygiene and sterile gloves were used. A 20ga Bbraun echogenic stimulator needle was used. Negative aspiration and negative test dose prior to incremental administration of local anesthetic. The patient tolerated the procedure well.  Ultrasound guidance: relevent anatomy identified, needle position confirmed, local anesthetic spread visualized around nerve(s), vascular puncture avoided.  Image printed for medical record.

## 2022-07-25 NOTE — Consult Note (Signed)
ORTHOPAEDIC CONSULTATION  REQUESTING PHYSICIAN: Azucena Fallen, MD  Chief Complaint: Abscess left foot  HPI: Aaron Franco is a 51 y.o. male who presents with abscess left foot with gangrenous changes to the lateral fifth metatarsal head.  Patient states that he has noticed redness and cellulitis since Labor Day.  Patient has a history of uncontrolled type 2 diabetes with uncontrolled hyperlipidemia.  Past Medical History:  Diagnosis Date   CAD (coronary artery disease)    a. 05/2013 NSTEMI/PCI: mCFX 100%-> 3.0 mm  x 20 mm Promus premier DES, EF 65%;  01/2014 Cath: LM nl, LAD 20-30d, LCX patent distal stent, RCA 30-31m, EF 55-65%.   Diabetes mellitus    Essential hypertension    GERD (gastroesophageal reflux disease)    Gout    History of echocardiogram    a. 03/2011 Echo: EF 65%, mild LVH, no rwma, mildly dil LA.   Hyperlipidemia    MI, old    Microalbuminuria due to type 2 diabetes mellitus (HCC) 12/14/2017   Obesity    Past Surgical History:  Procedure Laterality Date   BICEPS TENDON REPAIR  Nov 13 2010   right   HAND SURGERY  1990   left   LEFT HEART CATH N/A 05/12/2013   Procedure: LEFT HEART CATH;  Surgeon: Lesleigh Noe, MD;  Location: [facility] CATH LAB;  Service: Cardiovascular;  Laterality: N/A;   LEFT HEART CATHETERIZATION WITH CORONARY ANGIOGRAM N/A 01/24/2014   Procedure: LEFT HEART CATHETERIZATION WITH CORONARY ANGIOGRAM;  Surgeon: Peter M Swaziland, MD;  Location: [facility] CATH LAB;  Service: Cardiovascular;  Laterality: N/A;   PERCUTANEOUS CORONARY STENT INTERVENTION (PCI-S)  05/2013   mCx 3.92mm x 20 mm Promus P DES   PERCUTANEOUS CORONARY STENT INTERVENTION (PCI-S)  05/12/2013   Procedure: PERCUTANEOUS CORONARY STENT INTERVENTION (PCI-S);  Surgeon: Lesleigh Noe, MD;  Location: [facility] CATH LAB;  Service: Cardiovascular;;   Social History   Socioeconomic History   Marital status: Married    Spouse name: Not on file   Number of children: 2   Years of education:  Not on file   Highest education level: Not on file  Occupational History   Occupation: floor maintenance    Employer: [employer]   Occupation: cook    Employer: [employer]  Tobacco Use   Smoking status: Former    Types: Cigarettes    Quit date: 11/10/1998    Years since quitting: 23.7   Smokeless tobacco: Never   Tobacco comments:    pt only smoked 1-2 cigs a day, not everyday on occs x 2 years  Vaping Use   Vaping Use: Never used  Substance and Sexual Activity   Alcohol use: Yes    Comment: rare   Drug use: No   Sexual activity: Yes  Other Topics Concern   Not on file  Social History Narrative   Not on file   Social Determinants of Health   Financial Resource Strain: Not on file  Food Insecurity: No Food Insecurity (07/23/2022)   Hunger Vital Sign    Worried About Running Out of Food in the Last Year: Never true    Ran Out of Food in the Last Year: Never true  Transportation Needs: Not on file  Physical Activity: Not on file  Stress: Not on file  Social Connections: Not on file   Family History  Problem Relation Age of Onset   Heart attack Father        deceased  Diabetes Father    Congestive Heart Failure Father    Stroke Mother    Pancreatic cancer Maternal Aunt    Colon cancer Neg Hx    - negative except otherwise stated in the family history section Allergies  Allergen Reactions   Influenza Vaccines Anaphylaxis    Reaction June 2017   Influenza Virus Vaccine H5n1 Anaphylaxis   Fenofibrate Other (See Comments)    Hot flashes   Metformin Other (See Comments)    Chest discomfort   Prednisone Other (See Comments)    Mood swings   Augmentin [Amoxicillin-Pot Clavulanate] Rash   Latex Rash   Prior to Admission medications   Medication Sig Start Date End Date Taking? Authorizing Provider  acetaminophen (TYLENOL) 325 MG tablet Take 2 tablets (650 mg total) by mouth every 4 (four) hours as needed for headache or mild pain. 05/06/17  Yes  Kilroy, Luke K, PA-C  allopurinol (ZYLOPRIM) 100 MG tablet TAKE 1 TABLET(100 MG) BY MOUTH DAILY Patient taking differently: Take 100 mg by mouth daily as needed (as directed- for gout). 12/21/20  Yes Mliss Sax, MD  amLODipine (NORVASC) 10 MG tablet Take 1 tablet (10 mg total) by mouth daily. 03/18/22  Yes Loyola Mast, MD  aspirin 81 MG chewable tablet Chew 1 tablet (81 mg total) by mouth daily. Patient taking differently: Chew 81 mg by mouth daily at 12 noon. 05/14/13  Yes Weaver, Scott T, PA-C  dapagliflozin propanediol (FARXIGA) 5 MG TABS tablet TAKE 1 TABLET(5 MG) BY MOUTH DAILY BREAKFAST Patient taking differently: Take 5 mg by mouth daily at 12 noon. 01/06/22  Yes Loyola Mast, MD  dexlansoprazole (DEXILANT) 60 MG capsule TAKE 1 CAPSULE(60 MG) BY MOUTH DAILY Patient taking differently: Take 60 mg by mouth daily. 07/07/22  Yes Loyola Mast, MD  fluticasone [facility]) 50 MCG/ACT nasal spray Place 1 spray into both nostrils daily as needed for allergies or rhinitis. 06/29/20 10/13/24 Yes Mliss Sax, MD  Glycerin-Polysorbate 80 (REFRESH DRY EYE THERAPY OP) Place 1 drop into both eyes 3 (three) times daily as needed (for dryness).   Yes [provider]  insulin degludec (TRESIBA FLEXTOUCH) 100 UNIT/ML FlexTouch Pen Inject 20 Units into the skin 2 (two) times daily. Patient taking differently: Inject 20 Units into the skin every 12 (twelve) hours. 06/06/22  Yes RuddBertram Millard, MD  nebivolol (BYSTOLIC) 5 MG tablet TAKE 1 TABLET(5 MG) BY MOUTH DAILY Patient taking differently: Take 5 mg by mouth at bedtime. 06/05/22  Yes Loyola Mast, MD  nitroGLYCERIN (NITROSTAT) 0.4 MG SL tablet Place 1 tablet (0.4 mg total) under the tongue every 5 (five) minutes x 3 doses as needed for chest pain. 12/21/20  Yes Mliss Sax, MD  rosuvastatin (CRESTOR) 40 MG tablet TAKE 1 TABLET(40 MG) BY MOUTH AT BEDTIME Patient taking differently: Take 40 mg by mouth at bedtime.  10/21/21  Yes Loyola Mast, MD  vitamin B-12 (CYANOCOBALAMIN) 500 MCG tablet Take 1 tablet (500 mcg total) by mouth daily. Patient taking differently: Take 500 mcg by mouth daily as needed (for supplementation). 03/20/20  Yes Mliss Sax, MD  Evolocumab (REPATHA SURECLICK) 140 MG/ML SOAJ Inject 140 mLs into the skin every 14 (fourteen) days. Patient not taking: Reported on 07/23/2022 05/02/22   Chrystie Nose, MD  gabapentin (NEURONTIN) 600 MG tablet Take 1 tablet (600 mg total) by mouth 3 (three) times daily. Patient not taking: Reported on 07/23/2022 01/23/21   Loyola Mast, MD  Insulin Pen Needle 31G X 8 MM MISC 1 Device by Does not apply route in the morning, at noon, in the evening, and at bedtime. 01/04/21   Shamleffer, Konrad Dolores, MD  omeprazole (PRILOSEC) 40 MG capsule TAKE 1 CAPSULE(40 MG) BY MOUTH DAILY Patient not taking: Reported on 07/23/2022 07/10/22   Mliss Sax, MD   ECHOCARDIOGRAM COMPLETE  Result Date: 07/24/2022    ECHOCARDIOGRAM REPORT   Patient Name:   MARKS SCALERA Date of Exam: 07/24/2022 Medical Rec #:  [id number]        Height:       72.0 in Accession #:    [id number]       Weight:       289.0 lb Date of Birth:  06-Jun-1971        BSA:          2.491 m Patient Age:    51 years         BP:           137/83 mmHg Patient Gender: M                HR:           89 bpm. Exam Location:  Inpatient Procedure: 2D Echo, Color Doppler and Cardiac Doppler Indications:    Murmur  History:        Patient has prior history of Echocardiogram examinations, most                 recent 04/27/2017. CAD; Risk Factors:Diabetes, Hypertension and                 Dyslipidemia.  Sonographer:    Gaynell Face Referring Phys: [id number] RONDELL A SMITH IMPRESSIONS  1. Left ventricular ejection fraction, by estimation, is 60 to 65%. Left ventricular ejection fraction by PLAX is 63 %. The left ventricle has normal function. The left ventricle has no regional wall motion abnormalities.  There is moderate concentric left ventricular hypertrophy of the basal-septal segment. Left ventricular diastolic parameters are consistent with Grade I diastolic dysfunction (impaired relaxation).  2. Right ventricular systolic function is normal. The right ventricular size is normal. Tricuspid regurgitation signal is inadequate for assessing PA pressure.  3. The mitral valve is grossly normal. Trivial mitral valve regurgitation.  4. The aortic valve is tricuspid. Aortic valve regurgitation is not visualized. Aortic valve sclerosis/calcification is present, without any evidence of aortic stenosis. Aortic valve mean gradient measures 8.0 mmHg.  5. The inferior vena cava is normal in size with <50% respiratory variability, suggesting right atrial pressure of 8 mmHg. Comparison(s): No significant change from prior study. 04/27/2017: LVEF 60-65%, moderate LVH, grade 1 DD. FINDINGS  Left Ventricle: Left ventricular ejection fraction, by estimation, is 60 to 65%. Left ventricular ejection fraction by PLAX is 63 %. The left ventricle has normal function. The left ventricle has no regional wall motion abnormalities. The left ventricular internal cavity size was normal in size. There is moderate concentric left ventricular hypertrophy of the basal-septal segment. Left ventricular diastolic parameters are consistent with Grade I diastolic dysfunction (impaired relaxation). Indeterminate filling pressures. Right Ventricle: The right ventricular size is normal. No increase in right ventricular wall thickness. Right ventricular systolic function is normal. Tricuspid regurgitation signal is inadequate for assessing PA pressure. Left Atrium: Left atrial size was normal in size. Right Atrium: Right atrial size was normal in size. Pericardium: There is no evidence of pericardial effusion. Mitral Valve: The mitral valve is grossly normal. Trivial  mitral valve regurgitation. Tricuspid Valve: The tricuspid valve is normal in structure.  Tricuspid valve regurgitation is not demonstrated. Aortic Valve: The aortic valve is tricuspid. Aortic valve regurgitation is not visualized. Aortic valve sclerosis/calcification is present, without any evidence of aortic stenosis. Aortic valve mean gradient measures 8.0 mmHg. Aortic valve peak gradient measures 14.7 mmHg. Aortic valve area, by VTI measures 2.50 cm. Pulmonic Valve: The pulmonic valve was normal in structure. Pulmonic valve regurgitation is not visualized. Aorta: The aortic root and ascending aorta are structurally normal, with no evidence of dilitation. Venous: The inferior vena cava is normal in size with less than 50% respiratory variability, suggesting right atrial pressure of 8 mmHg. IAS/Shunts: No atrial level shunt detected by color flow Doppler.  LEFT VENTRICLE PLAX 2D LV EF:         Left            Diastology                ventricular     LV e' medial:    10.80 cm/s                ejection        LV E/e' medial:  8.0                fraction by     LV e' lateral:   8.70 cm/s                PLAX is 63      LV E/e' lateral: 9.9                %. LVIDd:         4.70 cm LVIDs:         3.10 cm LV PW:         1.00 cm LV IVS:        1.40 cm LVOT diam:     2.40 cm LV SV:         111 LV SV Index:   44 LVOT Area:     4.52 cm  RIGHT VENTRICLE TAPSE (M-mode): 2.0 cm LEFT ATRIUM             Index        RIGHT ATRIUM           Index LA diam:        4.70 cm 1.89 cm/m   RA Area:     15.20 cm LA Vol (A2C):   78.9 ml 31.67 ml/m  RA Volume:   33.70 ml  13.53 ml/m LA Vol (A4C):   60.7 ml 24.36 ml/m LA Biplane Vol: 69.9 ml 28.06 ml/m  AORTIC VALVE AV Area (Vmax):    2.50 cm AV Area (Vmean):   2.73 cm AV Area (VTI):     2.50 cm AV Vmax:           192.00 cm/s AV Vmean:          134.000 cm/s AV VTI:            0.443 m AV Peak Grad:      14.7 mmHg AV Mean Grad:      8.0 mmHg LVOT Vmax:         106.00 cm/s LVOT Vmean:        81.000 cm/s LVOT VTI:          0.245 m LVOT/AV VTI ratio: 0.55  AORTA Ao Asc diam:  3.40 cm MITRAL VALVE MV  Area (PHT): 2.96 cm    SHUNTS MV Decel Time: 256 msec    Systemic VTI:  0.24 m MV E velocity: 86.50 cm/s  Systemic Diam: 2.40 cm MV A velocity: 81.00 cm/s MV E/A ratio:  1.07 Zoila Shutter MD Electronically signed by Zoila Shutter MD Signature Date/Time: 07/24/2022/1:53:41 PM    Final    MRI Left foot without contrast  Result Date: 07/23/2022 CLINICAL DATA:  Foot swelling, diabetic, osteomyelitis suspected. EXAM: MRI OF THE LEFT FOOT WITHOUT CONTRAST TECHNIQUE: Multiplanar, multisequence MR imaging of the left was performed. No intravenous contrast was administered. COMPARISON:  Radiograph performed earlier on the same date. FINDINGS: Bones/Joint/Cartilage Bone marrow edema of the fifth metatarsal head and proximal phalanx of the fifth digit with small joint effusion. There is also mild marrow edema of the fourth metatarsal head without evidence of joint effusion. There are subchondral cystic changes at the first metatarsophalangeal joint. Marrow signal within remaining osseous structures is within normal limits. Ligaments Lisfranc and collateral ligaments are intact. 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 Soft tissues Marked subcutaneous soft tissue edema about the ankle and foot. There is a fluid collection about the dorsal aspect of the fourth and fifth metatarsals which extends into the space between the metatarsal heads measures at least 1.5 x 3.4 x 3.3 cm, suggesting abscess. IMPRESSION: 1. Bone marrow edema of the fifth metatarsal head and proximal phalanx of the fifth digit with small joint effusion, as well as marrow edema of the fourth metatarsal head. In the presence of adjacent fluid collection/abscess the findings are concerning for early osteomyelitis. 2. 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 3. Fluid collection about the dorsal aspect of the  fourth and fifth metatarsal heads which extends into the space between the metatarsal heads concerning for abscess, measuring approximately 1.5 x 3.4 x 3.3 cm. Electronically Signed   By: Larose Hires D.O.   On: 07/23/2022 21:51   DG Chest Port 1 View  Result Date: 07/23/2022 CLINICAL DATA:  Sepsis EXAM: PORTABLE CHEST 1 VIEW COMPARISON:  12/27/2020 FINDINGS: The heart size and mediastinal contours are within normal limits. Both lungs are clear. The visualized skeletal structures are unremarkable. IMPRESSION: No active disease. Electronically Signed   By: Deatra Robinson M.D.   On: 07/23/2022 19:34   - pertinent xrays, CT, MRI studies were reviewed and independently interpreted  Positive ROS: All other systems have been reviewed and were otherwise negative with the exception of those mentioned in the HPI and as above.  Physical Exam: General: Alert, no acute distress Psychiatric: Patient is competent for consent with normal mood and affect Lymphatic: No axillary or cervical lymphadenopathy Cardiovascular: No pedal edema Respiratory: No cyanosis, no use of accessory musculature GI: No organomegaly, abdomen is soft and non-tender    Images:  @  Labs:  Lab Results  Component Value Date   HGBA1C 13.4 (H) 07/23/2022   HGBA1C 14.8 (H) 11/28/2021   HGBA1C 14.1 (H) 11/30/2020   ESRSEDRATE 99 (H) 07/23/2022   ESRSEDRATE 20 04/11/2011   ESRSEDRATE 25 (H) 10/16/2009   CRP 21.5 (H) 07/23/2022   CRP 0.8 (H) 04/11/2011   LABURIC 6.9 03/13/2020   LABURIC 5.0 06/06/2010   LABURIC 6.1 03/16/2009   REPTSTATUS PENDING 07/23/2022   CULT  07/23/2022    NO GROWTH 2 DAYS Performed at [facility], [address] [place], [place] [address]     Lab Results  Component Value  Date   ALBUMIN 3.1 (L) 07/23/2022   ALBUMIN 4.4 11/28/2021   ALBUMIN 4.3 11/30/2020   LABURIC 6.9 03/13/2020   LABURIC 5.0 06/06/2010   LABURIC 6.1 03/16/2009        Latest Ref Rng & Units 07/25/2022     5:42 AM 07/24/2022   12:21 AM 07/23/2022    5:58 PM  CBC EXTENDED  WBC 4.0 - 10.5 K/uL 12.2  12.0  14.6   RBC 4.22 - 5.81 MIL/uL 4.30  4.30  4.91   Hemoglobin 13.0 - 17.0 g/dL 84.611.1  96.211.3  95.212.7   HCT 39.0 - 52.0 % 34.8  35.9  41.2   Platelets 150 - 400 K/uL 294  289  360   NEUT# 1.7 - 7.7 K/uL   11.4   Lymph# 0.7 - 4.0 K/uL   1.6     Neurologic: Patient does not have protective sensation bilateral lower extremities.   MUSCULOSKELETAL:   Skin: Examination patient has an abscess that extends across the dorsum of the foot to the second metatarsal.  This extends plantarly with a necrotic ulcer over the fifth metatarsal head.  Patient has a strong dorsalis pedis pulse bilaterally with no ulcerations on the right foot.  Review of the MRI scan shows osteomyelitis involving the fourth and fifth metatarsals with a large associated abscess.  Patient's hemoglobin A1c has consistently run around 14.  Patient has triglycerides greater than 1000 and elevated cholesterol as well.  Hemoglobin 11.1 with a white cell count of 12.2.  Patient has a absolute neutrophil count of 11.4 and a lymphocyte count of 1.6.  Patient has a elevated neutrophil lymphocyte ratio.  Sed rate 99.  Assessment: Assessment: Uncontrolled type 2 diabetes with uncontrolled hyperlipidemia with abscess osteomyelitis and gangrenous changes to the left forefoot.  Plan: Plan: Discussed with the patient that we would need to proceed at a minimum with a transmetatarsal amputation.  Discussed that we may need to do additional surgical debridement or higher level amputation.  Discussed the importance of proper nutrition to resolve these underlying medical conditions.  Patient states he understands wished to proceed with surgery.  Thank you for the consult and the opportunity to see Mr. Lonzo [place]  Zyliah Schier, MD [facility] [phone] 7:40 AM

## 2022-07-25 NOTE — Progress Notes (Signed)
Called ortho tech for post op shoe.

## 2022-07-25 NOTE — Inpatient Diabetes Management (Signed)
Inpatient Diabetes Program Recommendations  AACE/ADA: New Consensus Statement on Inpatient Glycemic Control (2015)  Target Ranges:  Prepandial:   less than 140 mg/dL      Peak postprandial:   less than 180 mg/dL (1-2 hours)      Critically ill patients:  140 - 180 mg/dL   Lab Results  Component Value Date   GLUCAP 203 (H) 07/25/2022   HGBA1C 13.4 (H) 07/23/2022    Review of Glycemic Control  Diabetes history: type 2 Outpatient Diabetes medications: Tresiba 20 units BID, Farxiga 5 mg daily Current orders for Inpatient glycemic control: Semglee 14 units daily, Novolog 0-15 units TID correction scale, Novolog 0-5 units HS scale, Farxiga 5 mg daily  Inpatient Diabetes Program Recommendations:   Received diabetes coordinator consult. Inpatient diabetes team spoke with patient on 9/14 about his diabetes.   Recommend increasing Semglee to 18 units daily and add Novolog 3 units TID with meals if patient eating at least 50% of meal.   Smith Mince RN BSN CDE Diabetes Coordinator Pager: [phone]  8am-5pm

## 2022-07-25 NOTE — Transfer of Care (Signed)
Immediate Anesthesia Transfer of Care Note  Patient: Aaron Franco  Procedure(s) Performed: AMPUTATION LEFT FOOT THROUGH MIDDLE OF FOOT (Left: Foot)  Patient Location: PACU  Anesthesia Type:MAC  Level of Consciousness: awake and alert   Airway & Oxygen Therapy: Patient Spontanous Breathing and Patient connected to nasal cannula oxygen  Post-op Assessment: Report given to RN and Patient moving all extremities X 4  Post vital signs: Reviewed and stable  Last Vitals:  Vitals Value Taken Time  BP 117/71 07/25/22 1315  Temp 37.1 C 07/25/22 1315  Pulse 88 07/25/22 1317  Resp 14 07/25/22 1317  SpO2 98 % 07/25/22 1317  Vitals shown include unvalidated device data.  Last Pain:  Vitals:   07/25/22 1315  TempSrc: Tympanic  PainSc:       Patients Stated Pain Goal: 2 (82/64/15 8309)  Complications: No notable events documented.

## 2022-07-25 NOTE — Progress Notes (Signed)
PROGRESS NOTE    Aaron Franco  NKN:[id number] DOB: [birth year] DOA: 07/23/2022 PCP: Loyola Mast, MD   Brief Narrative:  Aaron Franco is a 51 y.o. male with medical history significant of  HTN, HLD, CAD s/p PCI, HfpEF, diabetes mellitus type 2, GERD, and obesity who presents with complaints of left foot infection. Patient meets criteria for sepsis at intake, imaging concerning for fluid collection of the lateral foot with edema possible abscess versus osteomyelitis.    Assessment & Plan:   Principal Problem:   Diabetic foot infection (HCC) Active Problems:   Cellulitis and abscess of foot   Sepsis (HCC)   Cardiac murmur   Type 2 diabetes mellitus with hyperglycemia, with long-term current use of insulin (HCC)   CAD S/P percutaneous coronary angioplasty -    Essential hypertension   Normocytic anemia   Dyslipidemia   Gastroesophageal reflux disease   History of gout   Obesity (BMI 30-39.9)  Sepsis secondary to cellulitis and abscess/rule out osteomyelitis, POA -Febrile, leukocytosis with notable inflammation of the left foot meeting sepsis criteria -MRI shows fluid collection with marrow edema of the fourth and fifth metatarsals concerning for early osteomyelitis -Cultures pending -Continue cefepime and Flagyl -Orthopedics consulted - tentative plan for left transmetatarsal amputation later today    Uncontrolled diabetes mellitus type 2, with hyperglycemia -A1c 13.4 -Lengthy discussion at bedside about need for dietary medication compliance given uncontrolled diabetes will limit wound healing and places patient at risk for future amputations and repeat infections -Home medication regimen includes Farxiga 5 mg daily and Tresiba 20 mg every 12 hours. Patient currently does not have endocrinology follow-up, "in between offices" -Continue to titrate sliding scale insulin, hypoglycemic protocol   CAD s/p PCI -Continue aspirin and statin   Murmur -Check  echocardiogram  Essential hypertension Continue home amlodipine, nebivolol  Hydralazine as needed  Normocytic anemia, likely chronic anemia of chronic disease Stable, follow repeat labs   Dyslipidemia -Continue Crestor   GERD -Continue pharmacy substitution of Protonix for Dexilant   History of gout Patient's symptoms suspected secondary to infection more so than gout at this time   Obesity BMI 38.52 kg/m  DVT prophylaxis: Early ambulation, SCDs Code Status: Full Family Communication: Wife at bedside  Status is: Inpatient  Dispo: The patient is from: Home              Anticipated d/c is to: Home              Anticipated d/c date is: 48 to 72 hours pending surgical course              Patient currently not medically stable for discharge  Consultants:  Orthopedic surgery  Procedures:  Pending above  Antimicrobials:  Cefepime, Flagyl  Subjective: No acute issues or events overnight denies nausea vomiting diarrhea constipation headache fevers chills or chest pain  Objective: Vitals:   07/24/22 0749 07/24/22 1251 07/24/22 2020 07/25/22 0410  BP: (!) 142/73 137/83 (!) 148/81 (!) 146/84  Pulse: 89 89 95 82  Resp: 17 18 18 18   Temp: 99.3 F (37.4 C) 99.5 F (37.5 C) 99.9 F (37.7 C) 98.9 F (37.2 C)  TempSrc: Oral Oral Oral Oral  SpO2: 95% 97% 96% 95%  Weight:      Height:        Intake/Output Summary (Last 24 hours) at 07/25/2022 0731 Last data filed at 07/24/2022 2052 Gross per 24 hour  Intake 700 ml  Output --  Net 700 ml  Filed Weights   07/23/22 1721 07/23/22 2240  Weight: 128.8 kg 131.1 kg    Examination:  General:  Pleasantly resting in bed, No acute distress. HEENT:  Normocephalic atraumatic.  Sclerae nonicteric, noninjected.  Extraocular movements intact bilaterally. Neck:  Without mass or deformity.  Trachea is midline. Lungs:  Clear to auscultate bilaterally without rhonchi, wheeze, or rales. Heart:  Regular rate and rhythm.  Without  murmurs, rubs, or gallops. Abdomen:  Soft, nontender, nondistended.  Without guarding or rebound. Vascular:  Dorsalis pedis and posterior tibial pulses palpable bilaterally. Skin: Left foot ulceration overlying the superior aspect of the fourth and fifth distal metatarsals with associated blanching erythema  Data Reviewed: I have personally reviewed following labs and imaging studies  CBC: Recent Labs  Lab 07/23/22 1758 07/24/22 0021 07/25/22 0542  WBC 14.6* 12.0* 12.2*  NEUTROABS 11.4*  --   --   HGB 12.7* 11.3* 11.1*  HCT 41.2 35.9* 34.8*  MCV 83.9 83.5 80.9  PLT 360 289 294    Basic Metabolic Panel: Recent Labs  Lab 07/23/22 1758 07/24/22 0021 07/25/22 0542  NA 138 136 134*  K 4.1 4.0 4.0  CL 102 106 102  CO2 24 23 22   GLUCOSE 239* 309* 178*  BUN 12 9 11   CREATININE 0.90 0.80 0.78  CALCIUM 9.6 8.5* 8.7*    GFR: Estimated Creatinine Clearance: 151.3 mL/min (by C-G formula based on SCr of 0.78 mg/dL). Liver Function Tests: Recent Labs  Lab 07/23/22 1758  AST 14*  ALT 14  ALKPHOS 91  BILITOT 0.7  PROT 8.7*  ALBUMIN 3.1*    No results for input(s): "LIPASE", "AMYLASE" in the last 168 hours. No results for input(s): "AMMONIA" in the last 168 hours. Coagulation Profile: Recent Labs  Lab 07/23/22 1758  INR 1.1    Cardiac Enzymes: No results for input(s): "CKTOTAL", "CKMB", "CKMBINDEX", "TROPONINI" in the last 168 hours. BNP (last 3 results) Recent Labs    03/18/22 1613 07/17/22 1130  PROBNP 28.0 34.0    HbA1C: Recent Labs    07/23/22 2111  HGBA1C 13.4*    CBG: Recent Labs  Lab 07/24/22 0803 07/24/22 1156 07/24/22 1710 07/24/22 2134 07/25/22 0729  GLUCAP 207* 230* 179* 207* 188*    Lipid Profile: No results for input(s): "CHOL", "HDL", "LDLCALC", "TRIG", "CHOLHDL", "LDLDIRECT" in the last 72 hours. Thyroid Function Tests: No results for input(s): "TSH", "T4TOTAL", "FREET4", "T3FREE", "THYROIDAB" in the last 72 hours. Anemia  Panel: No results for input(s): "VITAMINB12", "FOLATE", "FERRITIN", "TIBC", "IRON", "RETICCTPCT" in the last 72 hours. Sepsis Labs: Recent Labs  Lab 07/23/22 1758 07/23/22 1921  LATICACIDVEN 1.1 1.4     Recent Results (from the past 240 hour(s))  Blood Culture (routine x 2)     Status: None (Preliminary result)   Collection Time: 07/23/22  5:58 PM   Specimen: BLOOD  Result Value Ref Range Status   Specimen Description BLOOD BLOOD RIGHT FOREARM  Final   Special Requests   Final    BOTTLES DRAWN AEROBIC AND ANAEROBIC Blood Culture adequate volume   Culture   Final    NO GROWTH 2 DAYS Performed at [facility], [address] [place], [place] [id number]    Report Status PENDING  Incomplete  Resp Panel by RT-PCR (Flu A&B, Covid) Anterior Nasal Swab     Status: None   Collection Time: 07/23/22  6:00 PM   Specimen: Anterior Nasal Swab  Result Value Ref Range Status   SARS Coronavirus 2 by  RT PCR NEGATIVE NEGATIVE Final    Comment: (NOTE) SARS-CoV-2 target nucleic acids are NOT DETECTED.  The SARS-CoV-2 RNA is generally detectable in upper respiratory specimens during the acute phase of infection. The lowest concentration of SARS-CoV-2 viral copies this assay can detect is 138 copies/mL. A negative result does not preclude SARS-Cov-2 infection and should not be used as the sole basis for treatment or other patient management decisions. A negative result may occur with  improper specimen collection/handling, submission of specimen other than nasopharyngeal swab, presence of viral mutation(s) within the areas targeted by this assay, and inadequate number of viral copies(<138 copies/mL). A negative result must be combined with clinical observations, patient history, and epidemiological information. The expected result is Negative.  Fact Sheet for Patients:  BloggerCourse.com  Fact Sheet for Healthcare Providers:   SeriousBroker.it  This test is no t yet approved or cleared by the [place] FDA and  has been authorized for detection and/or diagnosis of SARS-CoV-2 by FDA under an Emergency Use Authorization (EUA). This EUA will remain  in effect (meaning this test can be used) for the duration of the COVID-19 declaration under Section 564(b)(1) of the Act, 21 U.S.C.section 360bbb-3(b)(1), unless the authorization is terminated  or revoked sooner.       Influenza A by PCR NEGATIVE NEGATIVE Final   Influenza B by PCR NEGATIVE NEGATIVE Final    Comment: (NOTE) The Xpert Xpress SARS-CoV-2/FLU/RSV plus assay is intended as an aid in the diagnosis of influenza from Nasopharyngeal swab specimens and should not be used as a sole basis for treatment. Nasal washings and aspirates are unacceptable for Xpert Xpress SARS-CoV-2/FLU/RSV testing.  Fact Sheet for Patients: BloggerCourse.com  Fact Sheet for Healthcare Providers: SeriousBroker.it  This test is not yet approved or cleared by the [place] FDA and has been authorized for detection and/or diagnosis of SARS-CoV-2 by FDA under an Emergency Use Authorization (EUA). This EUA will remain in effect (meaning this test can be used) for the duration of the COVID-19 declaration under Section 564(b)(1) of the Act, 21 U.S.C. section 360bbb-3(b)(1), unless the authorization is terminated or revoked.  Performed at [facility], [address] [place], [place] [address]   Blood Culture (routine x 2)     Status: None (Preliminary result)   Collection Time: 07/23/22  6:05 PM   Specimen: BLOOD  Result Value Ref Range Status   Specimen Description BLOOD SITE NOT SPECIFIED  Final   Special Requests   Final    BOTTLES DRAWN AEROBIC AND ANAEROBIC Blood Culture adequate volume   Culture   Final    NO GROWTH 2 DAYS Performed at [facility], [address],  [place], [place] [address]    Report Status PENDING  Incomplete  Surgical pcr screen     Status: None   Collection Time: 07/24/22 10:55 PM   Specimen: Nasal Mucosa; Nasal Swab  Result Value Ref Range Status   MRSA, PCR NEGATIVE NEGATIVE Final   Staphylococcus aureus NEGATIVE NEGATIVE Final    Comment: (NOTE) The Xpert SA Assay (FDA approved for NASAL specimens in patients 36 years of age and older), is one component of a comprehensive surveillance program. It is not intended to diagnose infection nor to guide or monitor treatment. Performed at [facility], [address] [place], [place] [address]          Radiology Studies: ECHOCARDIOGRAM COMPLETE  Result Date: 07/24/2022    ECHOCARDIOGRAM REPORT   Patient  Name:   HANNIBAL SKALLA Date of Exam: 07/24/2022 Medical Rec #:  [id number]        Height:       72.0 in Accession #:    [id number]       Weight:       289.0 lb Date of Birth:  Apr 28, 1971        BSA:          2.491 m Patient Age:    51 years         BP:           137/83 mmHg Patient Gender: M                HR:           89 bpm. Exam Location:  Inpatient Procedure: 2D Echo, Color Doppler and Cardiac Doppler Indications:    Murmur  History:        Patient has prior history of Echocardiogram examinations, most                 recent 04/27/2017. CAD; Risk Factors:Diabetes, Hypertension and                 Dyslipidemia.  Sonographer:    Gaynell Face Referring Phys: [id number] RONDELL A SMITH IMPRESSIONS  1. Left ventricular ejection fraction, by estimation, is 60 to 65%. Left ventricular ejection fraction by PLAX is 63 %. The left ventricle has normal function. The left ventricle has no regional wall motion abnormalities. There is moderate concentric left ventricular hypertrophy of the basal-septal segment. Left ventricular diastolic parameters are consistent with Grade I diastolic dysfunction (impaired relaxation).  2. Right ventricular systolic function is normal. The right ventricular size  is normal. Tricuspid regurgitation signal is inadequate for assessing PA pressure.  3. The mitral valve is grossly normal. Trivial mitral valve regurgitation.  4. The aortic valve is tricuspid. Aortic valve regurgitation is not visualized. Aortic valve sclerosis/calcification is present, without any evidence of aortic stenosis. Aortic valve mean gradient measures 8.0 mmHg.  5. The inferior vena cava is normal in size with <50% respiratory variability, suggesting right atrial pressure of 8 mmHg. Comparison(s): No significant change from prior study. 04/27/2017: LVEF 60-65%, moderate LVH, grade 1 DD. FINDINGS  Left Ventricle: Left ventricular ejection fraction, by estimation, is 60 to 65%. Left ventricular ejection fraction by PLAX is 63 %. The left ventricle has normal function. The left ventricle has no regional wall motion abnormalities. The left ventricular internal cavity size was normal in size. There is moderate concentric left ventricular hypertrophy of the basal-septal segment. Left ventricular diastolic parameters are consistent with Grade I diastolic dysfunction (impaired relaxation). Indeterminate filling pressures. Right Ventricle: The right ventricular size is normal. No increase in right ventricular wall thickness. Right ventricular systolic function is normal. Tricuspid regurgitation signal is inadequate for assessing PA pressure. Left Atrium: Left atrial size was normal in size. Right Atrium: Right atrial size was normal in size. Pericardium: There is no evidence of pericardial effusion. Mitral Valve: The mitral valve is grossly normal. Trivial mitral valve regurgitation. Tricuspid Valve: The tricuspid valve is normal in structure. Tricuspid valve regurgitation is not demonstrated. Aortic Valve: The aortic valve is tricuspid. Aortic valve regurgitation is not visualized. Aortic valve sclerosis/calcification is present, without any evidence of aortic stenosis. Aortic valve mean gradient measures 8.0  mmHg. Aortic valve peak gradient measures 14.7 mmHg. Aortic valve area, by VTI measures 2.50 cm. Pulmonic Valve: The pulmonic valve was normal in structure. Pulmonic  valve regurgitation is not visualized. Aorta: The aortic root and ascending aorta are structurally normal, with no evidence of dilitation. Venous: The inferior vena cava is normal in size with less than 50% respiratory variability, suggesting right atrial pressure of 8 mmHg. IAS/Shunts: No atrial level shunt detected by color flow Doppler.  LEFT VENTRICLE PLAX 2D LV EF:         Left            Diastology                ventricular     LV e' medial:    10.80 cm/s                ejection        LV E/e' medial:  8.0                fraction by     LV e' lateral:   8.70 cm/s                PLAX is 63      LV E/e' lateral: 9.9                %. LVIDd:         4.70 cm LVIDs:         3.10 cm LV PW:         1.00 cm LV IVS:        1.40 cm LVOT diam:     2.40 cm LV SV:         111 LV SV Index:   44 LVOT Area:     4.52 cm  RIGHT VENTRICLE TAPSE (M-mode): 2.0 cm LEFT ATRIUM             Index        RIGHT ATRIUM           Index LA diam:        4.70 cm 1.89 cm/m   RA Area:     15.20 cm LA Vol (A2C):   78.9 ml 31.67 ml/m  RA Volume:   33.70 ml  13.53 ml/m LA Vol (A4C):   60.7 ml 24.36 ml/m LA Biplane Vol: 69.9 ml 28.06 ml/m  AORTIC VALVE AV Area (Vmax):    2.50 cm AV Area (Vmean):   2.73 cm AV Area (VTI):     2.50 cm AV Vmax:           192.00 cm/s AV Vmean:          134.000 cm/s AV VTI:            0.443 m AV Peak Grad:      14.7 mmHg AV Mean Grad:      8.0 mmHg LVOT Vmax:         106.00 cm/s LVOT Vmean:        81.000 cm/s LVOT VTI:          0.245 m LVOT/AV VTI ratio: 0.55  AORTA Ao Asc diam: 3.40 cm MITRAL VALVE MV Area (PHT): 2.96 cm    SHUNTS MV Decel Time: 256 msec    Systemic VTI:  0.24 m MV E velocity: 86.50 cm/s  Systemic Diam: 2.40 cm MV A velocity: 81.00 cm/s MV E/A ratio:  1.07 Zoila Shutter MD Electronically signed by Zoila Shutter MD Signature  Date/Time: 07/24/2022/1:53:41 PM    Final    MRI Left foot without contrast  Result Date: 07/23/2022 CLINICAL DATA:  Foot swelling,  diabetic, osteomyelitis suspected. EXAM: MRI OF THE LEFT FOOT WITHOUT CONTRAST TECHNIQUE: Multiplanar, multisequence MR imaging of the left was performed. No intravenous contrast was administered. COMPARISON:  Radiograph performed earlier on the same date. FINDINGS: Bones/Joint/Cartilage Bone marrow edema of the fifth metatarsal head and proximal phalanx of the fifth digit with small joint effusion. There is also mild marrow edema of the fourth metatarsal head without evidence of joint effusion. There are subchondral cystic changes at the first metatarsophalangeal joint. Marrow signal within remaining osseous structures is within normal limits. Ligaments Lisfranc and collateral ligaments are intact. 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 Soft tissues Marked subcutaneous soft tissue edema about the ankle and foot. There is a fluid collection about the dorsal aspect of the fourth and fifth metatarsals which extends into the space between the metatarsal heads measures at least 1.5 x 3.4 x 3.3 cm, suggesting abscess. IMPRESSION: 1. Bone marrow edema of the fifth metatarsal head and proximal phalanx of the fifth digit with small joint effusion, as well as marrow edema of the fourth metatarsal head. In the presence of adjacent fluid collection/abscess the findings are concerning for early osteomyelitis. 2. 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 3. Fluid collection about the dorsal aspect of the fourth and fifth metatarsal heads which extends into the space between the metatarsal heads concerning for abscess, measuring approximately 1.5 x 3.4 x 3.3 cm. Electronically Signed   By: Larose Hires D.O.   On: 07/23/2022 21:51   DG Chest Port 1 View  Result Date:  07/23/2022 CLINICAL DATA:  Sepsis EXAM: PORTABLE CHEST 1 VIEW COMPARISON:  12/27/2020 FINDINGS: The heart size and mediastinal contours are within normal limits. Both lungs are clear. The visualized skeletal structures are unremarkable. IMPRESSION: No active disease. Electronically Signed   By: Deatra Robinson M.D.   On: 07/23/2022 19:34    Scheduled Meds:  amLODipine  10 mg Oral Daily   aspirin  81 mg Oral Q1200   dapagliflozin propanediol  5 mg Oral Q1200   insulin aspart  0-15 Units Subcutaneous TID WC   insulin aspart  0-5 Units Subcutaneous QHS   insulin glargine-yfgn  14 Units Subcutaneous QHS   metroNIDAZOLE  500 mg Oral Q12H   nebivolol  5 mg Oral QHS   pantoprazole  40 mg Oral Daily   rosuvastatin  40 mg Oral QHS   sodium chloride flush  3 mL Intravenous Q12H   Continuous Infusions:  ceFEPime (MAXIPIME) IV 2 g (07/25/22 0610)     LOS: 2 days   Time spent:  Azucena Fallen, DO [facility]  If 7PM-7AM, please contact night-coverage [email or web address]  07/25/2022, 7:31 AM

## 2022-07-25 NOTE — Progress Notes (Signed)
Pt to OR via bed. Wife at bedside. Report given to Short Stay staff.

## 2022-07-25 NOTE — Progress Notes (Signed)
Brief Nutrition Note  Consult received for DM Education. Will place consult for outpatient education and attach handouts for review to AVS.  Please place new consult if acute nutrition needs arise.  Greig Castilla, RD, LDN Clinical Dietitian RD pager # available in AMION  After hours/weekend pager # available in [facility]

## 2022-07-25 NOTE — Anesthesia Preprocedure Evaluation (Addendum)
Anesthesia Evaluation  Patient identified by MRN, date of birth, ID band Patient awake    Reviewed: Allergy & Precautions, NPO status , Patient's Chart, lab work & pertinent test results  Airway Mallampati: II  TM Distance: >3 FB Neck ROM: Full    Dental no notable dental hx.    Pulmonary former smoker,    Pulmonary exam normal        Cardiovascular hypertension, Pt. on medications and Pt. on home beta blockers + CAD, + Past MI and + Cardiac Stents  Normal cardiovascular exam  ECHO: Left ventricular ejection fraction, by estimation, is 60 to 65%. Left ventricular ejection fraction by PLAX is 63 %. The left ventricle has normal function. The left ventricle has no regional wall motion abnormalities. There is moderate concentric left ventricular hypertrophy of the basal-septal segment. Left ventricular diastolic parameters are consistent with Grade I diastolic dysfunction (impaired relaxation). 1. Right ventricular systolic function is normal. The right ventricular size is normal. Tricuspid regurgitation signal is inadequate for assessing PA pressure. 2. 3. The mitral valve is grossly normal. Trivial mitral valve regurgitation. The aortic valve is tricuspid. Aortic valve regurgitation is not visualized. Aortic valve sclerosis/calcification is present, without any evidence of aortic stenosis. Aortic valve mean gradient measures 8.0 mmHg. The inferior vena cava is normal in size with <50% respiratory variability, suggesting right atrial pressure of 8 mmHg.   Neuro/Psych negative neurological ROS  negative psych ROS   GI/Hepatic Neg liver ROS, GERD  Medicated and Controlled,  Endo/Other  diabetes, Insulin Dependent  Renal/GU negative Renal ROS     Musculoskeletal negative musculoskeletal ROS (+)   Abdominal (+) + obese,   Peds  Hematology  (+) Blood dyscrasia, anemia ,   Anesthesia Other Findings Osteomyelitis 4th  and 5th Metatarsal  Reproductive/Obstetrics                           Anesthesia Physical Anesthesia Plan  ASA: 3  Anesthesia Plan: Regional   Post-op Pain Management:    Induction: Intravenous  PONV Risk Score and Plan: 1 and Ondansetron, Propofol infusion, Treatment may vary due to age or medical condition and Midazolam  Airway Management Planned: Simple Face Mask  Additional Equipment:   Intra-op Plan:   Post-operative Plan:   Informed Consent: I have reviewed the patients History and Physical, chart, labs and discussed the procedure including the risks, benefits and alternatives for the proposed anesthesia with the patient or authorized representative who has indicated his/her understanding and acceptance.     Dental advisory given  Plan Discussed with: CRNA  Anesthesia Plan Comments:         Anesthesia Quick Evaluation

## 2022-07-26 DIAGNOSIS — L089 Local infection of the skin and subcutaneous tissue, unspecified: Secondary | ICD-10-CM | POA: Diagnosis not present

## 2022-07-26 DIAGNOSIS — E11628 Type 2 diabetes mellitus with other skin complications: Secondary | ICD-10-CM | POA: Diagnosis not present

## 2022-07-26 LAB — GLUCOSE, CAPILLARY
Glucose-Capillary: 274 mg/dL — ABNORMAL HIGH (ref 70–99)
Glucose-Capillary: 240 mg/dL — ABNORMAL HIGH (ref 70–99)
Glucose-Capillary: 158 mg/dL — ABNORMAL HIGH (ref 70–99)
Glucose-Capillary: 175 mg/dL — ABNORMAL HIGH (ref 70–99)
Glucose-Capillary: 214 mg/dL — ABNORMAL HIGH (ref 70–99)

## 2022-07-26 NOTE — Evaluation (Signed)
Physical Therapy Evaluation Patient Details Name: Aaron Franco MRN: [id number] DOB: 10/08/1971 Today's Date: 07/26/2022  History of Present Illness  51 yo male admitted 9/13 with left foot infection s/p left transmet amputation 9/15. PMHx: HTN, HLD, CAD, HFpEF, DM, GERD, obesity  Clinical Impression  Pt pleasant and very willing to mobilize. Pt normally independent and working 2 jobs. Pt with supportive spouse and able to perform basic transfers and gait in room with assist. Pt with decreased transfers and gait who will benefit from acute therapy to maximize mobility and safety with NWB status. Pt may progress to no further needs outside acute.        Recommendations for follow up therapy are one component of a multi-disciplinary discharge planning process, led by the attending physician.  Recommendations may be updated based on patient status, additional functional criteria and insurance authorization.  Follow Up Recommendations Home health PT      Assistance Recommended at Discharge Intermittent Supervision/Assistance  Patient can return home with the following  Assistance with cooking/housework;Assist for transportation;Help with stairs or ramp for entrance    Equipment Recommendations Rolling walker (2 wheels);BSC/3in1  Recommendations for Other Services       Functional Status Assessment Patient has had a recent decline in their functional status and demonstrates the ability to make significant improvements in function in a reasonable and predictable amount of time.     Precautions / Restrictions Precautions Precaution Comments: VAC Required Braces or Orthoses: Other Brace Other Brace: post op shoe Restrictions Weight Bearing Restrictions: Yes LLE Weight Bearing: Non weight bearing      Mobility  Bed Mobility Overal bed mobility: Modified Independent             General bed mobility comments: HOB 10 degrees    Transfers Overall transfer level: Needs  assistance   Transfers: Sit to/from Stand Sit to Stand: Min assist           General transfer comment: cues for hand placement, sequence and safety    Ambulation/Gait Ambulation/Gait assistance: Min guard Gait Distance (Feet): 20 Feet Assistive device: Rolling walker (2 wheels) Gait Pattern/deviations: Step-to pattern   Gait velocity interpretation: <1.8 ft/sec, indicate of risk for recurrent falls   General Gait Details: pt with hopping pattern and able to move around in room with cues for RW use and safety, maintaining NWB LLE  Stairs            Wheelchair Mobility    Modified Rankin (Stroke Patients Only)       Balance Overall balance assessment: No apparent balance deficits (not formally assessed)                                           Pertinent Vitals/Pain Pain Assessment Pain Assessment: No/denies pain    Home Living Family/patient expects to be discharged to:: Private residence Living Arrangements: Spouse/significant other Available Help at Discharge: Family Type of Home: House Home Access: Stairs to enter   Technical brewer of Steps: 1   Home Layout: One level Home Equipment: Grab bars - tub/shower      Prior Function Prior Level of Function : Independent/Modified Independent                     Hand Dominance        Extremity/Trunk Assessment   Upper Extremity Assessment Upper Extremity Assessment: Overall [facility]  for tasks assessed    Lower Extremity Assessment Lower Extremity Assessment: Overall WFL for tasks assessed    Cervical / Trunk Assessment Cervical / Trunk Assessment: Normal  Communication   Communication: No difficulties  Cognition Arousal/Alertness: Awake/alert Behavior During Therapy: WFL for tasks assessed/performed Overall Cognitive Status: Within Functional Limits for tasks assessed                                          General Comments      Exercises      Assessment/Plan    PT Assessment Patient needs continued PT services  PT Problem List Decreased strength;Decreased mobility;Decreased activity tolerance;Decreased balance;Decreased knowledge of use of DME       PT Treatment Interventions Gait training;Functional mobility training;Stair training;Therapeutic activities;Patient/family education;DME instruction;Therapeutic exercise    PT Goals (Current goals can be found in the Care Plan section)  Acute Rehab PT Goals Patient Stated Goal: return to work (works in Ambulance person for schools and maintenance for [employer]) PT Goal Formulation: With patient/family Time For Goal Achievement: 08/02/22 Potential to Achieve Goals: Good    Frequency Min 3X/week     Co-evaluation               AM-PAC PT "6 Clicks" Mobility  Outcome Measure Help needed turning from your back to your side while in a flat bed without using bedrails?: None Help needed moving from lying on your back to sitting on the side of a flat bed without using bedrails?: None Help needed moving to and from a bed to a chair (including a wheelchair)?: A Little Help needed standing up from a chair using your arms (e.g., wheelchair or bedside chair)?: A Little Help needed to walk in hospital room?: A Little Help needed climbing 3-5 steps with a railing? : A Lot 6 Click Score: 19    End of Session   Activity Tolerance: Patient tolerated treatment well Patient left: in chair;with call bell/phone within reach;with family/visitor present Nurse Communication: Mobility status PT Visit Diagnosis: Other abnormalities of gait and mobility (R26.89);Difficulty in walking, not elsewhere classified (R26.2)    Time: 7425-9563 PT Time Calculation (min) (ACUTE ONLY): 21 min   Charges:   PT Evaluation $PT Eval Moderate Complexity: 1 Mod          Aaron Franco P, PT Acute Rehabilitation Services Office: [phone]   Sandy Salaam Shakthi Scipio 07/26/2022, 2:10 PM

## 2022-07-26 NOTE — Progress Notes (Signed)
Patient ID: Aaron Franco, male   DOB: [birth year], 51 y.o.   MRN: [id number] Patient is postoperative day 1 transmetatarsal amputation for extensive abscess and osteomyelitis left foot.  Wound VAC is functioning well.  Discussed the importance of nonweightbearing on the left foot.  Cultures are showing gram-positive cocci.  Would continue IV antibiotics until cultures are finalized and patient could be discharged on 3 weeks of oral antibiotics.  Tissue margins were debrided back to healthy viable tissue however the abscess extended proximal on the dorsum of the foot.

## 2022-07-26 NOTE — Progress Notes (Signed)
PROGRESS NOTE    Aaron Franco  TKZ:[id number] DOB: 03-25-1971 DOA: 07/23/2022 PCP: Loyola Mast, MD   Brief Narrative:  Aaron Franco is a 51 y.o. male with medical history significant of  HTN, HLD, CAD s/p PCI, HfpEF, diabetes mellitus type 2, GERD, and obesity who presents with complaints of left foot infection. Patient meets criteria for sepsis at intake, imaging concerning for fluid collection of the lateral foot with edema possible abscess versus osteomyelitis.    Assessment & Plan:   Principal Problem:   Diabetic foot infection (HCC) Active Problems:   Cellulitis and abscess of foot   Sepsis (HCC)   Cardiac murmur   Type 2 diabetes mellitus with hyperglycemia, with long-term current use of insulin (HCC)   CAD S/P percutaneous coronary angioplasty -    Essential hypertension   Normocytic anemia   Dyslipidemia   Gastroesophageal reflux disease   History of gout   Obesity (BMI 30-39.9)  Sepsis secondary to cellulitis, abscess and osteomyelitis, POA -Febrile, leukocytosis with notable inflammation of the left foot meeting sepsis criteria -MRI shows fluid collection with marrow edema of the fourth and fifth metatarsals concerning for early osteomyelitis -Cultures pending -Continue cefepime and Flagyl -Orthopedics following - Left transmetatarsal amputation 07/25/22    Uncontrolled diabetes mellitus type 2, with hyperglycemia -A1c 13.4 -Lengthy discussion at bedside about need for dietary medication compliance given uncontrolled diabetes will limit wound healing and places patient at risk for future amputations and repeat infections -Home medication regimen includes Farxiga 5 mg daily and Tresiba 20 mg every 12 hours. Patient currently does not have endocrinology follow-up, "in between offices" -Continue to titrate sliding scale insulin, hypoglycemic protocol   CAD s/p PCI -Continue aspirin and statin   Murmur -Check echocardiogram  Essential  hypertension Continue home amlodipine, nebivolol  Hydralazine as needed  Normocytic anemia, likely chronic anemia of chronic disease Stable, follow repeat labs   Dyslipidemia -Continue Crestor   GERD -Continue pharmacy substitution of Protonix for Dexilant   History of gout Patient's symptoms suspected secondary to infection more so than gout at this time   Obesity BMI 38.52 kg/m  DVT prophylaxis: Early ambulation, SCDs Code Status: Full Family Communication: Wife at bedside  Status is: Inpatient  Dispo: The patient is from: Home              Anticipated d/c is to: Home              Anticipated d/c date is: 48 to 72 hours pending post-op course              Patient currently not medically stable for discharge  Consultants:  Orthopedic surgery  Procedures:  Pending above  Antimicrobials:  Cefepime, Flagyl  Subjective: No acute issues or events overnight denies nausea vomiting diarrhea constipation headache fevers chills or chest pain  Objective: Vitals:   07/25/22 1851 07/25/22 2032 07/26/22 0014 07/26/22 0619  BP: 124/82 130/85 (!) 147/80 136/81  Pulse: 85 86 92 86  Resp: 18 17 18 18   Temp: 98.6 F (37 C) 98.7 F (37.1 C) 100 F (37.8 C) 98.7 F (37.1 C)  TempSrc: Oral Oral Oral Oral  SpO2: 98% 98% 97% 94%  Weight:      Height:        Intake/Output Summary (Last 24 hours) at 07/26/2022 0730 Last data filed at 07/26/2022 0726 Gross per 24 hour  Intake 1706.26 ml  Output 825 ml  Net 881.26 ml    Filed Weights   07/23/22  1721 07/23/22 2240 07/25/22 1042  Weight: 128.8 kg 131.1 kg 128.8 kg    Examination:  General:  Pleasantly resting in bed, No acute distress. HEENT:  Normocephalic atraumatic.  Sclerae nonicteric, noninjected.  Extraocular movements intact bilaterally. Neck:  Without mass or deformity.  Trachea is midline. Lungs:  Clear to auscultate bilaterally without rhonchi, wheeze, or rales. Heart:  Regular rate and rhythm.  Without  murmurs, rubs, or gallops. Abdomen:  Soft, nontender, nondistended.  Without guarding or rebound. Vascular:  Dorsalis pedis and posterior tibial pulses palpable bilaterally. Skin: Left foot ulceration overlying the superior aspect of the fourth and fifth distal metatarsals with associated blanching erythema  Data Reviewed: I have personally reviewed following labs and imaging studies  CBC: Recent Labs  Lab 07/23/22 1758 07/24/22 0021 07/25/22 0542  WBC 14.6* 12.0* 12.2*  NEUTROABS 11.4*  --   --   HGB 12.7* 11.3* 11.1*  HCT 41.2 35.9* 34.8*  MCV 83.9 83.5 80.9  PLT 360 289 294    Basic Metabolic Panel: Recent Labs  Lab 07/23/22 1758 07/24/22 0021 07/25/22 0542  NA 138 136 134*  K 4.1 4.0 4.0  CL 102 106 102  CO2 24 23 22   GLUCOSE 239* 309* 178*  BUN 12 9 11   CREATININE 0.90 0.80 0.78  CALCIUM 9.6 8.5* 8.7*    GFR: Estimated Creatinine Clearance: 149.9 mL/min (by C-G formula based on SCr of 0.78 mg/dL). Liver Function Tests: Recent Labs  Lab 07/23/22 1758  AST 14*  ALT 14  ALKPHOS 91  BILITOT 0.7  PROT 8.7*  ALBUMIN 3.1*    No results for input(s): "LIPASE", "AMYLASE" in the last 168 hours. No results for input(s): "AMMONIA" in the last 168 hours. Coagulation Profile: Recent Labs  Lab 07/23/22 1758  INR 1.1    Cardiac Enzymes: No results for input(s): "CKTOTAL", "CKMB", "CKMBINDEX", "TROPONINI" in the last 168 hours. BNP (last 3 results) Recent Labs    03/18/22 1613 07/17/22 1130  PROBNP 28.0 34.0    HbA1C: Recent Labs    07/23/22 2111  HGBA1C 13.4*    CBG: Recent Labs  Lab 07/25/22 0729 07/25/22 1021 07/25/22 1311 07/25/22 1844 07/25/22 2149  GLUCAP 188* 187* 203* 200* 203*    Lipid Profile: No results for input(s): "CHOL", "HDL", "LDLCALC", "TRIG", "CHOLHDL", "LDLDIRECT" in the last 72 hours. Thyroid Function Tests: No results for input(s): "TSH", "T4TOTAL", "FREET4", "T3FREE", "THYROIDAB" in the last 72 hours. Anemia  Panel: No results for input(s): "VITAMINB12", "FOLATE", "FERRITIN", "TIBC", "IRON", "RETICCTPCT" in the last 72 hours. Sepsis Labs: Recent Labs  Lab 07/23/22 1758 07/23/22 1921  LATICACIDVEN 1.1 1.4     Recent Results (from the past 240 hour(s))  Blood Culture (routine x 2)     Status: None (Preliminary result)   Collection Time: 07/23/22  5:58 PM   Specimen: BLOOD  Result Value Ref Range Status   Specimen Description BLOOD BLOOD RIGHT FOREARM  Final   Special Requests   Final    BOTTLES DRAWN AEROBIC AND ANAEROBIC Blood Culture adequate volume   Culture   Final    NO GROWTH 2 DAYS Performed at [facility], [address] [place], [address] [place]    Report Status PENDING  Incomplete  Resp Panel by RT-PCR (Flu A&B, Covid) Anterior Nasal Swab     Status: None   Collection Time: 07/23/22  6:00 PM   Specimen: Anterior Nasal Swab  Result Value Ref Range Status   SARS Coronavirus 2 by RT  PCR NEGATIVE NEGATIVE Final    Comment: (NOTE) SARS-CoV-2 target nucleic acids are NOT DETECTED.  The SARS-CoV-2 RNA is generally detectable in upper respiratory specimens during the acute phase of infection. The lowest concentration of SARS-CoV-2 viral copies this assay can detect is 138 copies/mL. A negative result does not preclude SARS-Cov-2 infection and should not be used as the sole basis for treatment or other patient management decisions. A negative result may occur with  improper specimen collection/handling, submission of specimen other than nasopharyngeal swab, presence of viral mutation(s) within the areas targeted by this assay, and inadequate number of viral copies(<138 copies/mL). A negative result must be combined with clinical observations, patient history, and epidemiological information. The expected result is Negative.  Fact Sheet for Patients:  BloggerCourse.com  Fact Sheet for Healthcare Providers:   SeriousBroker.it  This test is no t yet approved or cleared by the [place] FDA and  has been authorized for detection and/or diagnosis of SARS-CoV-2 by FDA under an Emergency Use Authorization (EUA). This EUA will remain  in effect (meaning this test can be used) for the duration of the COVID-19 declaration under Section 564(b)(1) of the Act, 21 U.S.C.section 360bbb-3(b)(1), unless the authorization is terminated  or revoked sooner.       Influenza A by PCR NEGATIVE NEGATIVE Final   Influenza B by PCR NEGATIVE NEGATIVE Final    Comment: (NOTE) The Xpert Xpress SARS-CoV-2/FLU/RSV plus assay is intended as an aid in the diagnosis of influenza from Nasopharyngeal swab specimens and should not be used as a sole basis for treatment. Nasal washings and aspirates are unacceptable for Xpert Xpress SARS-CoV-2/FLU/RSV testing.  Fact Sheet for Patients: BloggerCourse.com  Fact Sheet for Healthcare Providers: SeriousBroker.it  This test is not yet approved or cleared by the [place] FDA and has been authorized for detection and/or diagnosis of SARS-CoV-2 by FDA under an Emergency Use Authorization (EUA). This EUA will remain in effect (meaning this test can be used) for the duration of the COVID-19 declaration under Section 564(b)(1) of the Act, 21 U.S.C. section 360bbb-3(b)(1), unless the authorization is terminated or revoked.  Performed at [facility], [address] [place], [place] [address]   Blood Culture (routine x 2)     Status: None (Preliminary result)   Collection Time: 07/23/22  6:05 PM   Specimen: BLOOD  Result Value Ref Range Status   Specimen Description BLOOD SITE NOT SPECIFIED  Final   Special Requests   Final    BOTTLES DRAWN AEROBIC AND ANAEROBIC Blood Culture adequate volume   Culture   Final    NO GROWTH 2 DAYS Performed at [facility], [address]  [place], [place] [address]    Report Status PENDING  Incomplete  Surgical pcr screen     Status: None   Collection Time: 07/24/22 10:55 PM   Specimen: Nasal Mucosa; Nasal Swab  Result Value Ref Range Status   MRSA, PCR NEGATIVE NEGATIVE Final   Staphylococcus aureus NEGATIVE NEGATIVE Final    Comment: (NOTE) The Xpert SA Assay (FDA approved for NASAL specimens in patients 98 years of age and older), is one component of a comprehensive surveillance program. It is not intended to diagnose infection nor to guide or monitor treatment. Performed at [facility], [address] [place], [place] [address]   Aerobic/Anaerobic Culture w Gram Stain (surgical/deep wound)     Status: None (Preliminary result)   Collection Time: 07/25/22 12:31 PM  Specimen: Abscess  Result Value Ref Range Status   Specimen Description ABSCESS  Final   Special Requests NONE  Final   Gram Stain   Final    ABUNDANT WBC PRESENT, PREDOMINANTLY PMN FEW GRAM POSITIVE COCCI IN PAIRS IN CLUSTERS INTRACELLULAR EXTRACELLULAR Performed at [facility], [address] [place], [place] [address]    Culture PENDING  Incomplete   Report Status PENDING  Incomplete         Radiology Studies: ECHOCARDIOGRAM COMPLETE  Result Date: 07/24/2022    ECHOCARDIOGRAM REPORT   Patient Name:   BANJAMIN STOVALL Date of Exam: 07/24/2022 Medical Rec #:  [id number]        Height:       72.0 in Accession #:    [id number]       Weight:       289.0 lb Date of Birth:  14-Nov-1970        BSA:          2.491 m Patient Age:    40 years         BP:           137/83 mmHg Patient Gender: M                HR:           89 bpm. Exam Location:  Inpatient Procedure: 2D Echo, Color Doppler and Cardiac Doppler Indications:    Murmur  History:        Patient has prior history of Echocardiogram examinations, most                 recent 04/27/2017. CAD; Risk Factors:Diabetes, Hypertension and                 Dyslipidemia.  Sonographer:    Memory Argue  Referring Phys: [id number] RONDELL A SMITH IMPRESSIONS  1. Left ventricular ejection fraction, by estimation, is 60 to 65%. Left ventricular ejection fraction by PLAX is 63 %. The left ventricle has normal function. The left ventricle has no regional wall motion abnormalities. There is moderate concentric left ventricular hypertrophy of the basal-septal segment. Left ventricular diastolic parameters are consistent with Grade I diastolic dysfunction (impaired relaxation).  2. Right ventricular systolic function is normal. The right ventricular size is normal. Tricuspid regurgitation signal is inadequate for assessing PA pressure.  3. The mitral valve is grossly normal. Trivial mitral valve regurgitation.  4. The aortic valve is tricuspid. Aortic valve regurgitation is not visualized. Aortic valve sclerosis/calcification is present, without any evidence of aortic stenosis. Aortic valve mean gradient measures 8.0 mmHg.  5. The inferior vena cava is normal in size with <50% respiratory variability, suggesting right atrial pressure of 8 mmHg. Comparison(s): No significant change from prior study. 04/27/2017: LVEF 60-65%, moderate LVH, grade 1 DD. FINDINGS  Left Ventricle: Left ventricular ejection fraction, by estimation, is 60 to 65%. Left ventricular ejection fraction by PLAX is 63 %. The left ventricle has normal function. The left ventricle has no regional wall motion abnormalities. The left ventricular internal cavity size was normal in size. There is moderate concentric left ventricular hypertrophy of the basal-septal segment. Left ventricular diastolic parameters are consistent with Grade I diastolic dysfunction (impaired relaxation). Indeterminate filling pressures. Right Ventricle: The right ventricular size is normal. No increase in right ventricular wall thickness. Right ventricular systolic function is normal. Tricuspid regurgitation signal is inadequate for assessing PA pressure. Left Atrium: Left atrial size  was normal in size. Right Atrium:  Right atrial size was normal in size. Pericardium: There is no evidence of pericardial effusion. Mitral Valve: The mitral valve is grossly normal. Trivial mitral valve regurgitation. Tricuspid Valve: The tricuspid valve is normal in structure. Tricuspid valve regurgitation is not demonstrated. Aortic Valve: The aortic valve is tricuspid. Aortic valve regurgitation is not visualized. Aortic valve sclerosis/calcification is present, without any evidence of aortic stenosis. Aortic valve mean gradient measures 8.0 mmHg. Aortic valve peak gradient measures 14.7 mmHg. Aortic valve area, by VTI measures 2.50 cm. Pulmonic Valve: The pulmonic valve was normal in structure. Pulmonic valve regurgitation is not visualized. Aorta: The aortic root and ascending aorta are structurally normal, with no evidence of dilitation. Venous: The inferior vena cava is normal in size with less than 50% respiratory variability, suggesting right atrial pressure of 8 mmHg. IAS/Shunts: No atrial level shunt detected by color flow Doppler.  LEFT VENTRICLE PLAX 2D LV EF:         Left            Diastology                ventricular     LV e' medial:    10.80 cm/s                ejection        LV E/e' medial:  8.0                fraction by     LV e' lateral:   8.70 cm/s                PLAX is 63      LV E/e' lateral: 9.9                %. LVIDd:         4.70 cm LVIDs:         3.10 cm LV PW:         1.00 cm LV IVS:        1.40 cm LVOT diam:     2.40 cm LV SV:         111 LV SV Index:   44 LVOT Area:     4.52 cm  RIGHT VENTRICLE TAPSE (M-mode): 2.0 cm LEFT ATRIUM             Index        RIGHT ATRIUM           Index LA diam:        4.70 cm 1.89 cm/m   RA Area:     15.20 cm LA Vol (A2C):   78.9 ml 31.67 ml/m  RA Volume:   33.70 ml  13.53 ml/m LA Vol (A4C):   60.7 ml 24.36 ml/m LA Biplane Vol: 69.9 ml 28.06 ml/m  AORTIC VALVE AV Area (Vmax):    2.50 cm AV Area (Vmean):   2.73 cm AV Area (VTI):     2.50 cm  AV Vmax:           192.00 cm/s AV Vmean:          134.000 cm/s AV VTI:            0.443 m AV Peak Grad:      14.7 mmHg AV Mean Grad:      8.0 mmHg LVOT Vmax:         106.00 cm/s LVOT Vmean:        81.000 cm/s LVOT VTI:  0.245 m LVOT/AV VTI ratio: 0.55  AORTA Ao Asc diam: 3.40 cm MITRAL VALVE MV Area (PHT): 2.96 cm    SHUNTS MV Decel Time: 256 msec    Systemic VTI:  0.24 m MV E velocity: 86.50 cm/s  Systemic Diam: 2.40 cm MV A velocity: 81.00 cm/s MV E/A ratio:  1.07 Zoila Shutter MD Electronically signed by Zoila Shutter MD Signature Date/Time: 07/24/2022/1:53:41 PM    Final     Scheduled Meds:  amLODipine  10 mg Oral Daily   vitamin C  1,000 mg Oral Daily   aspirin  81 mg Oral Q1200   dapagliflozin propanediol  5 mg Oral Q1200   docusate sodium  100 mg Oral Daily   insulin aspart  0-15 Units Subcutaneous TID WC   insulin aspart  0-5 Units Subcutaneous QHS   insulin glargine-yfgn  14 Units Subcutaneous QHS   metroNIDAZOLE  500 mg Oral Q12H   nebivolol  5 mg Oral QHS   nutrition supplement (JUVEN)  1 packet Oral BID BM   pantoprazole  40 mg Oral Daily   rosuvastatin  40 mg Oral QHS   sodium chloride flush  3 mL Intravenous Q12H   zinc sulfate  220 mg Oral Daily   Continuous Infusions:  sodium chloride 10 mL/hr at 07/26/22 0726   ceFEPime (MAXIPIME) IV Stopped (07/26/22 0641)   magnesium sulfate bolus IVPB       LOS: 3 days   Time spent:  Azucena Fallen, DO [facility]  If 7PM-7AM, please contact night-coverage [email or web address]  07/26/2022, 7:30 AM

## 2022-07-27 DIAGNOSIS — L089 Local infection of the skin and subcutaneous tissue, unspecified: Secondary | ICD-10-CM | POA: Diagnosis not present

## 2022-07-27 DIAGNOSIS — E11628 Type 2 diabetes mellitus with other skin complications: Secondary | ICD-10-CM | POA: Diagnosis not present

## 2022-07-27 LAB — BASIC METABOLIC PANEL
Anion gap: 10 (ref 5–15)
BUN: 12 mg/dL (ref 6–20)
CO2: 22 mmol/L (ref 22–32)
Calcium: 8.5 mg/dL — ABNORMAL LOW (ref 8.9–10.3)
Chloride: 103 mmol/L (ref 98–111)
Creatinine, Ser: 0.65 mg/dL (ref 0.61–1.24)
GFR, Estimated: >60 mL/min (ref >60)
Glucose, Bld: 172 mg/dL — ABNORMAL HIGH (ref 70–99)
Potassium: 4.3 mmol/L (ref 3.5–5.1)
Sodium: 135 mmol/L (ref 135–145)

## 2022-07-27 LAB — CBC
HCT: 30.8 % — ABNORMAL LOW (ref 39.0–52.0)
Hemoglobin: 9.9 g/dL — ABNORMAL LOW (ref 13.0–17.0)
MCH: 25.7 pg — ABNORMAL LOW (ref 26.0–34.0)
MCHC: 32.1 g/dL (ref 30.0–36.0)
MCV: 80 fL (ref 80.0–100.0)
Platelets: 334 10*3/uL (ref 150–400)
RBC: 3.85 MIL/uL — ABNORMAL LOW (ref 4.22–5.81)
RDW: 13.2 % (ref 11.5–15.5)
WBC: 11.3 10*3/uL — ABNORMAL HIGH (ref 4.0–10.5)
nRBC: 0 % (ref 0.0–0.2)

## 2022-07-27 LAB — GLUCOSE, CAPILLARY
Glucose-Capillary: 190 mg/dL — ABNORMAL HIGH (ref 70–99)
Glucose-Capillary: 235 mg/dL — ABNORMAL HIGH (ref 70–99)
Glucose-Capillary: 288 mg/dL — ABNORMAL HIGH (ref 70–99)
Glucose-Capillary: 173 mg/dL — ABNORMAL HIGH (ref 70–99)

## 2022-07-27 MED ORDER — CEFAZOLIN SODIUM-DEXTROSE 2-4 GM/100ML-% IV SOLN
2.0000 g | Freq: Three times a day (TID) | INTRAVENOUS | Status: DC
  Administered 2022-07-27 – 2022-07-28 (×3): 2 g via INTRAVENOUS
  Filled 2022-07-27 (×3): qty 100

## 2022-07-27 MED ORDER — POLYETHYLENE GLYCOL 3350 17 G PO PACK
17.0000 g | PACK | Freq: Two times a day (BID) | ORAL | Status: DC
  Administered 2022-07-27 – 2022-07-28 (×3): 17 g via ORAL
  Filled 2022-07-27 (×3): qty 1

## 2022-07-27 NOTE — Plan of Care (Signed)

## 2022-07-27 NOTE — Plan of Care (Signed)
  Problem: Education: Goal: Ability to describe self-care measures that may prevent or decrease complications (Diabetes Survival Skills Education) will improve Outcome: Progressing Goal: Individualized Educational Video(s) Outcome: Progressing   Problem: Coping: Goal: Ability to adjust to condition or change in health will improve 07/27/2022 0349 by Keturah Shavers, RN Outcome: Progressing 07/27/2022 0129 by Keturah Shavers, RN Outcome: Progressing   Problem: Fluid Volume: Goal: Ability to maintain a balanced intake and output will improve 07/27/2022 0349 by Keturah Shavers, RN Outcome: Progressing 07/27/2022 0129 by Keturah Shavers, RN Outcome: Progressing   Problem: Health Behavior/Discharge Planning: Goal: Ability to identify and utilize available resources and services will improve 07/27/2022 0349 by Keturah Shavers, RN Outcome: Progressing 07/27/2022 0129 by Keturah Shavers, RN Outcome: Progressing Goal: Ability to manage health-related needs will improve 07/27/2022 0349 by Keturah Shavers, RN Outcome: Progressing 07/27/2022 0129 by Keturah Shavers, RN Outcome: Progressing

## 2022-07-27 NOTE — Progress Notes (Deleted)
Cardiology Office Note   Date:  07/27/2022   ID:  Aaron Franco, DOB [birth year], MRN [id number]  PCP:  Haydee Salter, MD  Cardiologist:   Minus Breeding, MD   No chief complaint on file.     History of Present Illness: Aaron Franco is a 51 y.o. male who presents for follow up of CAD.  He has a history of of STEMI 2014 w/ DES CFX.  Cath demonstrated non obstructive disease in 2015.   He had chest pain in 2018 and a negative perfusion study.   We saw him in Feb 2021 he was in the ED with chest pain.  He had a low risk perfusion study.   Since I last saw him he has done okay.  He has not been particularly probably following diet.  He is put on some weight.  He does a lot of walking at work.  He walks stairs.  He does not get chest pressure, neck or arm discomfort.  He does not get palpitations, presyncope or syncope.  He has some mild shortness of breath.  He has had none of the symptoms such as those that he had at the time of his STEMI.   Past Medical History:  Diagnosis Date   CAD (coronary artery disease)    a. 05/2013 NSTEMI/PCI: mCFX 100%-> 3.0 mm  x 20 mm Promus premier DES, EF 65%;  01/2014 Cath: LM nl, LAD 20-30d, LCX patent distal stent, RCA 30-60m EF 55-65%.   Diabetes mellitus    Essential hypertension    GERD (gastroesophageal reflux disease)    Gout    History of echocardiogram    a. 03/2011 Echo: EF 65%, mild LVH, no rwma, mildly dil LA.   Hyperlipidemia    MI, old    Microalbuminuria due to type 2 diabetes mellitus ([facility] 12/14/2017   Obesity     Past Surgical History:  Procedure Laterality Date   BICEPS TENDON REPAIR  Nov 13 2010   right   HAND SURGERY  1990   left   LEFT HEART CATH N/A 05/12/2013   Procedure: LEFT HEART CATH;  Surgeon: HSinclair Grooms MD;  [facility];  Service: Cardiovascular;  Laterality: N/A;   LEFT HEART CATHETERIZATION WITH CORONARY ANGIOGRAM N/A 01/24/2014   Procedure: LEFT HEART CATHETERIZATION WITH CORONARY  ANGIOGRAM;  Surgeon: Peter M JMartinique MD;  [facility];  Service: Cardiovascular;  Laterality: N/A;   PERCUTANEOUS CORONARY STENT INTERVENTION (PCI-S)  05/2013   mCx 3.08mx 20 mm Promus P DES   PERCUTANEOUS CORONARY STENT INTERVENTION (PCI-S)  05/12/2013   Procedure: PERCUTANEOUS CORONARY STENT INTERVENTION (PCI-S);  Surgeon: HeSinclair GroomsMD;  [facility];  Service: Cardiovascular;;     No current facility-administered medications for this visit.   Current Outpatient Medications  Medication Sig Dispense Refill   Evolocumab (REPATHA SURECLICK) 14559G/ML SOAJ Inject 140 mLs into the skin every 14 (fourteen) days. 6 mL 3   Facility-Administered Medications Ordered in Other Visits  Medication Dose Route Frequency Provider Last Rate Last Admin   0.9 %  sodium chloride infusion   Intravenous Continuous DuNewt MinionMD 10 mL/hr at 07/26/22 0726 Infusion Verify at 07/26/22 0726   acetaminophen (TYLENOL) tablet 325-650 mg  325-650 mg Oral Q6H PRN DuNewt MinionMD       albuterol (PROVENTIL) (2.5 MG/3ML) 0.083% nebulizer solution 2.5 mg  2.5 mg Nebulization Q6H PRN DuNewt MinionMD  alum & mag hydroxide-simeth (MAALOX/MYLANTA) 200-200-20 MG/5ML suspension 15-30 mL  15-30 mL Oral Q2H PRN Newt Minion, MD       amLODipine (NORVASC) tablet 10 mg  10 mg Oral Daily Newt Minion, MD   10 mg at 07/27/22 7824   ascorbic acid (VITAMIN C) tablet 1,000 mg  1,000 mg Oral Daily Newt Minion, MD   1,000 mg at 07/27/22 2353   aspirin chewable tablet 81 mg  81 mg Oral Q1200 Newt Minion, MD   81 mg at 07/27/22 1152   bisacodyl (DULCOLAX) EC tablet 5 mg  5 mg Oral Daily PRN Newt Minion, MD       ceFAZolin (ANCEF) IVPB 2g/100 mL premix  2 g Intravenous Q8H Little Ishikawa, MD       dapagliflozin propanediol (FARXIGA) tablet 5 mg  5 mg Oral Q1200 Newt Minion, MD   5 mg at 07/27/22 1152   docusate sodium (COLACE) capsule 100 mg  100 mg Oral Daily Newt Minion, MD    100 mg at 07/27/22 0829   guaiFENesin-dextromethorphan (ROBITUSSIN DM) 100-10 MG/5ML syrup 15 mL  15 mL Oral Q4H PRN Newt Minion, MD       hydrALAZINE (APRESOLINE) injection 10 mg  10 mg Intravenous Q4H PRN Newt Minion, MD       hydrALAZINE (APRESOLINE) injection 5 mg  5 mg Intravenous Q20 Min PRN Newt Minion, MD       HYDROcodone-acetaminophen (NORCO/VICODIN) 5-325 MG per tablet 1 tablet  1 tablet Oral Q6H PRN Newt Minion, MD       HYDROmorphone (DILAUDID) injection 0.5-1 mg  0.5-1 mg Intravenous Q4H PRN Newt Minion, MD       insulin aspart (novoLOG) injection 0-15 Units  0-15 Units Subcutaneous TID WC Newt Minion, MD   8 Units at 07/27/22 1643   insulin aspart (novoLOG) injection 0-5 Units  0-5 Units Subcutaneous QHS Newt Minion, MD   2 Units at 07/26/22 2146   insulin glargine-yfgn (SEMGLEE) injection 14 Units  14 Units Subcutaneous QHS Newt Minion, MD   14 Units at 07/26/22 2146   labetalol (NORMODYNE) injection 10 mg  10 mg Intravenous Q10 min PRN Newt Minion, MD       magnesium citrate solution 1 Bottle  1 Bottle Oral Once PRN Newt Minion, MD       magnesium sulfate IVPB 2 g 50 mL  2 g Intravenous Daily PRN Newt Minion, MD       metoprolol tartrate (LOPRESSOR) injection 2-5 mg  2-5 mg Intravenous Q2H PRN Newt Minion, MD       metroNIDAZOLE (FLAGYL) tablet 500 mg  500 mg Oral Q12H Newt Minion, MD   500 mg at 07/27/22 1643   nebivolol (BYSTOLIC) tablet 5 mg  5 mg Oral QHS Newt Minion, MD   5 mg at 07/26/22 2041   nutrition supplement (JUVEN) (JUVEN) powder packet 1 packet  1 packet Oral BID BM Newt Minion, MD   1 packet at 07/27/22 1426   ondansetron (ZOFRAN) injection 4 mg  4 mg Intravenous Q6H PRN Newt Minion, MD       oxyCODONE (Oxy IR/ROXICODONE) immediate release tablet 10-15 mg  10-15 mg Oral Q4H PRN Newt Minion, MD       oxyCODONE (Oxy IR/ROXICODONE) immediate release tablet 5-10 mg  5-10 mg Oral Q4H PRN Newt Minion, MD  5 mg at  07/27/22 1426   pantoprazole (PROTONIX) EC tablet 40 mg  40 mg Oral Daily Newt Minion, MD   40 mg at 07/27/22 0829   phenol (CHLORASEPTIC) mouth spray 1 spray  1 spray Mouth/Throat PRN Newt Minion, MD       polyethylene glycol (MIRALAX / GLYCOLAX) packet 17 g  17 g Oral BID Little Ishikawa, MD   17 g at 07/27/22 1152   potassium chloride SA (KLOR-CON M) CR tablet 20-40 mEq  20-40 mEq Oral Daily PRN Newt Minion, MD       rosuvastatin (CRESTOR) tablet 40 mg  40 mg Oral QHS Newt Minion, MD   40 mg at 07/26/22 2040   sodium chloride flush (NS) 0.9 % injection 3 mL  3 mL Intravenous Q12H Newt Minion, MD   3 mL at 07/27/22 0830   zinc sulfate capsule 220 mg  220 mg Oral Daily Newt Minion, MD   220 mg at 07/27/22 3532    Allergies:   Influenza vaccines, Influenza virus vaccine h5n1, Fenofibrate, Metformin, Prednisone, Augmentin [amoxicillin-pot clavulanate], and Latex    ROS:  Please see the history of present illness.   Otherwise, review of systems are positive for none.   All other systems are reviewed and negative.    PHYSICAL EXAM: VS:  There were no vitals taken for this visit. , BMI There is no height or weight on file to calculate BMI. GENERAL:  Well appearing NECK:  No jugular venous distention, waveform within normal limits, carotid upstroke brisk and symmetric, no bruits, no thyromegaly LUNGS:  Clear to auscultation bilaterally CHEST:  Unremarkable HEART:  PMI not displaced or sustained,S1 and S2 within normal limits, no S3, no S4, no clicks, no rubs, soft brief apical nonradiating systolic, no diastolic murmurs ABD:  Flat, positive bowel sounds normal in frequency in pitch, no bruits, no rebound, no guarding, no midline pulsatile mass, no hepatomegaly, no splenomegaly EXT:  2 plus pulses throughout, no edema, no cyanosis no clubbing   EKG:  EKG is  ordered today. The ekg ordered today demonstrates sinus rhythm, rate 90, axis within normal limits, intervals  within normal limits, no acute ST-T wave changes.   Recent Labs: 11/28/2021: TSH 1.180 07/17/2022: Pro B Natriuretic peptide (BNP) 34.0 07/23/2022: ALT 14 07/27/2022: BUN 12; Creatinine, Ser 0.65; Hemoglobin 9.9; Platelets 334; Potassium 4.3; Sodium 135    Lipid Panel    Component Value Date/Time   CHOL 352 (H) 11/28/2021 1026   TRIG 1,016 (HH) 11/28/2021 1026   HDL 34 (L) 11/28/2021 1026   CHOLHDL 10.4 (H) 11/28/2021 1026   CHOLHDL 7 03/13/2020 1636   VLDL UNABLE TO CALCULATE IF TRIGLYCERIDE OVER 400 mg/dL 05/05/2017 0008   LDLCALC Comment (A) 11/28/2021 1026   LDLDIRECT 151 (H) 11/28/2021 1026   LDLDIRECT 110.0 03/13/2020 1636      Wt Readings from Last 3 Encounters:  07/25/22 284 lb (128.8 kg)  07/23/22 284 lb (128.8 kg)  07/17/22 283 lb 12.8 oz (128.7 kg)      Other studies Reviewed: Additional studies/ records that were reviewed today include: Labs. Review of the above records demonstrates:   See elsewhere   ASSESSMENT AND PLAN:  CAD: He has no new symptoms since his last negative stress test in 2021.  No change in therapy.  Hypertension:   The blood pressure is blood pressure is elevated today but he says it is in the 1 teens over 53s at  home.  No change in therapy.    Hyperlipidemia:   LDL was not measured.  His triglycerides were markedly elevated.  Total cholesterol was 384.  This was all January of last year and I am going to repeat this.   I will be checking his lipids along with a direct LDL.  We will check some other labs to include a c-Met, TSH and CBC.  DM2:    A1c was not at target.  His last A1c that I can see was 14.1.  Has missed appointments with his endocrinologist.  We talked at length about diet.  We talked about a Mediterranean plant-based diet.  He needs close follow-up with his endocrinologist.   OBESITY: We had a long discussion about diet.  Current medicines are reviewed at length with the patient today.  The patient does not have concerns  regarding medicines.  The following changes have been made:     Labs/ tests ordered today include:     No orders of the defined types were placed in this encounter.    Disposition:   FU with me in six months.    Signed, Minus Breeding, MD  07/27/2022 7:35 PM    [place]

## 2022-07-27 NOTE — Anesthesia Postprocedure Evaluation (Signed)
Anesthesia Post Note  Patient: Aaron Franco  Procedure(s) Performed: AMPUTATION LEFT FOOT THROUGH MIDDLE OF FOOT (Left: Foot)     Patient location during evaluation: PACU Anesthesia Type: Regional Level of consciousness: awake Pain management: pain level controlled Vital Signs Assessment: post-procedure vital signs reviewed and stable Respiratory status: spontaneous breathing, nonlabored ventilation, respiratory function stable and patient connected to nasal cannula oxygen Cardiovascular status: stable and blood pressure returned to baseline Postop Assessment: no apparent nausea or vomiting Anesthetic complications: no   No notable events documented.  Last Vitals:  Vitals:   07/27/22 0749 07/27/22 1530  BP: 133/81 126/75  Pulse: 82 84  Resp: 18 18  Temp: 37.3 C 37.1 C  SpO2: 96% 97%    Last Pain:  Vitals:   07/27/22 1530  TempSrc: Oral  PainSc:                  [name] P Walda Hertzog

## 2022-07-27 NOTE — Progress Notes (Signed)
PROGRESS NOTE    Aaron Franco  ZOX:[id number] DOB: 1971/06/16 DOA: 07/23/2022 PCP: Loyola Mastudd, Stephen M, MD   Brief Narrative:  Aaron Setanthony Donna is a 51 y.o. male with medical history significant of  HTN, HLD, CAD s/p PCI, HfpEF, diabetes mellitus type 2, GERD, and obesity who presents with complaints of left foot infection. Patient meets criteria for sepsis at intake, imaging concerning for fluid collection of the lateral foot with edema possible abscess versus osteomyelitis.    Patient tolerated procedure quite well, awaiting cultures for definitive antibiotic de-escalation likely in the next 24 to 48 hours.  Disposition likely home with or without home health and PT pending clinical functioning status at discharge.  Assessment & Plan:   Principal Problem:   Diabetic foot infection (HCC) Active Problems:   Cellulitis and abscess of foot   Sepsis (HCC)   Cardiac murmur   Type 2 diabetes mellitus with hyperglycemia, with long-term current use of insulin (HCC)   CAD S/P percutaneous coronary angioplasty -    Essential hypertension   Normocytic anemia   Dyslipidemia   Gastroesophageal reflux disease   History of gout   Obesity (BMI 30-39.9)  Sepsis secondary to cellulitis, abscess and osteomyelitis, POA -Febrile, leukocytosis with notable inflammation of the left foot meeting sepsis criteria -MRI shows fluid collection with marrow edema of the fourth and fifth metatarsals concerning for early osteomyelitis -Cultures pending - Continue cefepime and Flagyl in the interim -Orthopedics following - 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 Patient currently does not have endocrinology follow-up, "in between offices" -Continue to titrate sliding scale insulin, hypoglycemic protocol   CAD s/p PCI -Continue aspirin and statin   Murmur -Check echocardiogram  Essential hypertension Continue home amlodipine, nebivolol  Hydralazine as needed  Normocytic anemia, likely chronic anemia of chronic disease Stable, follow repeat labs   Dyslipidemia -Continue Crestor   GERD -Continue pharmacy substitution of Protonix for Dexilant   History of gout Patient's symptoms suspected secondary to infection more so than gout at this time   Obesity BMI 38.52 kg/m  DVT prophylaxis: Early ambulation, SCDs Code Status: Full Family Communication: Wife at bedside  Status is: Inpatient  Dispo: The patient is from: Home              Anticipated d/c is to: Home              Anticipated d/c date is: 48 to 72 hours pending post-op course              Patient currently not medically stable for discharge  Consultants:  Orthopedic surgery  Procedures:  Pending above  Antimicrobials:  Cefepime, Flagyl  Subjective: No acute issues or events overnight, denies nausea vomiting diarrhea constipation headache fevers chills or chest pain.   Objective: Vitals:   07/26/22 1642 07/26/22 1958 07/27/22 0419 07/27/22 0749  BP: (!) 140/85 132/74 (!) 145/80 133/81  Pulse: 87 88 80 82  Resp: 17 16 16 18   Temp: 99.6 F (37.6 C) 99.1 F (37.3 C) 98.1 F (36.7 C) 99.1 F (37.3 C)  TempSrc: Oral Oral Oral Oral  SpO2: 97% 95% 98% 96%  Weight:  Height:        Intake/Output Summary (Last 24 hours) at 07/27/2022 0808 Last data filed at 07/27/2022 0700 Gross per 24 hour  Intake 580 ml  Output 1425 ml  Net -845 ml    Filed Weights   07/23/22 1721 07/23/22 2240 07/25/22 1042  Weight: 128.8 kg 131.1 kg 128.8 kg    Examination:  General:  Pleasantly resting in  bed, No acute distress. HEENT:  Normocephalic atraumatic.  Sclerae nonicteric, noninjected.  Extraocular movements intact bilaterally. Neck:  Without mass or deformity.  Trachea is midline. Lungs:  Clear to auscultate bilaterally without rhonchi, wheeze, or rales. Heart:  Regular rate and rhythm.  Without murmurs, rubs, or gallops. Abdomen:  Soft, nontender, nondistended.  Without guarding or rebound. Skin: Left foot bandage and wound VAC clean dry intact  Data Reviewed: I have personally reviewed following labs and imaging studies  CBC: Recent Labs  Lab 07/23/22 1758 07/24/22 0021 07/25/22 0542  WBC 14.6* 12.0* 12.2*  NEUTROABS 11.4*  --   --   HGB 12.7* 11.3* 11.1*  HCT 41.2 35.9* 34.8*  MCV 83.9 83.5 80.9  PLT 360 289 294    Basic Metabolic Panel: Recent Labs  Lab 07/23/22 1758 07/24/22 0021 07/25/22 0542  NA 138 136 134*  K 4.1 4.0 4.0  CL 102 106 102  CO2 24 23 22   GLUCOSE 239* 309* 178*  BUN 12 9 11   CREATININE 0.90 0.80 0.78  CALCIUM 9.6 8.5* 8.7*    GFR: Estimated Creatinine Clearance: 149.9 mL/min (by C-G formula based on SCr of 0.78 mg/dL). Liver Function Tests: Recent Labs  Lab 07/23/22 1758  AST 14*  ALT 14  ALKPHOS 91  BILITOT 0.7  PROT 8.7*  ALBUMIN 3.1*    No results for input(s): "LIPASE", "AMYLASE" in the last 168 hours. No results for input(s): "AMMONIA" in the last 168 hours. Coagulation Profile: Recent Labs  Lab 07/23/22 1758  INR 1.1    Cardiac Enzymes: No results for input(s): "CKTOTAL", "CKMB", "CKMBINDEX", "TROPONINI" in the last 168 hours. BNP (last 3 results) Recent Labs    03/18/22 1613 07/17/22 1130  PROBNP 28.0 34.0    HbA1C: No results for input(s): "HGBA1C" in the last 72 hours.  CBG: Recent Labs  Lab 07/26/22 1125 07/26/22 1643 07/26/22 1959 07/26/22 2139 07/27/22 0755  GLUCAP 240* 158* 175* 214* 190*    Lipid Profile: No results for input(s): "CHOL", "HDL", "LDLCALC", "TRIG", "CHOLHDL",  "LDLDIRECT" in the last 72 hours. Thyroid Function Tests: No results for input(s): "TSH", "T4TOTAL", "FREET4", "T3FREE", "THYROIDAB" in the last 72 hours. Anemia Panel: No results for input(s): "VITAMINB12", "FOLATE", "FERRITIN", "TIBC", "IRON", "RETICCTPCT" in the last 72 hours. Sepsis Labs: Recent Labs  Lab 07/23/22 1758 07/23/22 1921  LATICACIDVEN 1.1 1.4     Recent Results (from the past 240 hour(s))  Blood Culture (routine x 2)     Status: None (Preliminary result)   Collection Time: 07/23/22  5:58 PM   Specimen: BLOOD  Result Value Ref Range Status   Specimen Description BLOOD BLOOD RIGHT FOREARM  Final   Special Requests   Final    BOTTLES DRAWN AEROBIC AND ANAEROBIC Blood Culture adequate volume   Culture   Final    NO GROWTH 3 DAYS Performed at [facility], [address] [place], [address] [place]    Report Status PENDING  Incomplete  Resp Panel by RT-PCR (Flu A&B, Covid) Anterior Nasal Swab     Status: None  Collection Time: 07/23/22  6:00 PM   Specimen: Anterior Nasal Swab  Result Value Ref Range Status   SARS Coronavirus 2 by RT PCR NEGATIVE NEGATIVE Final    Comment: (NOTE) SARS-CoV-2 target nucleic acids are NOT DETECTED.  The SARS-CoV-2 RNA is generally detectable in upper respiratory specimens during the acute phase of infection. The lowest concentration of SARS-CoV-2 viral copies this assay can detect is 138 copies/mL. A negative result does not preclude SARS-Cov-2 infection and should not be used as the sole basis for treatment or other patient management decisions. A negative result may occur with  improper specimen collection/handling, submission of specimen other than nasopharyngeal swab, presence of viral mutation(s) within the areas targeted by this assay, and inadequate number of viral copies(<138 copies/mL). A negative result must be combined with clinical observations, patient history, and epidemiological information. The expected  result is Negative.  Fact Sheet for Patients:  BloggerCourse.com  Fact Sheet for Healthcare Providers:  SeriousBroker.it  This test is no t yet approved or cleared by the [place] FDA and  has been authorized for detection and/or diagnosis of SARS-CoV-2 by FDA under an Emergency Use Authorization (EUA). This EUA will remain  in effect (meaning this test can be used) for the duration of the COVID-19 declaration under Section 564(b)(1) of the Act, 21 U.S.C.section 360bbb-3(b)(1), unless the authorization is terminated  or revoked sooner.       Influenza A by PCR NEGATIVE NEGATIVE Final   Influenza B by PCR NEGATIVE NEGATIVE Final    Comment: (NOTE) The Xpert Xpress SARS-CoV-2/FLU/RSV plus assay is intended as an aid in the diagnosis of influenza from Nasopharyngeal swab specimens and should not be used as a sole basis for treatment. Nasal washings and aspirates are unacceptable for Xpert Xpress SARS-CoV-2/FLU/RSV testing.  Fact Sheet for Patients: BloggerCourse.com  Fact Sheet for Healthcare Providers: SeriousBroker.it  This test is not yet approved or cleared by the [place] FDA and has been authorized for detection and/or diagnosis of SARS-CoV-2 by FDA under an Emergency Use Authorization (EUA). This EUA will remain in effect (meaning this test can be used) for the duration of the COVID-19 declaration under Section 564(b)(1) of the Act, 21 U.S.C. section 360bbb-3(b)(1), unless the authorization is terminated or revoked.  Performed at [facility], [address], [place], [place] [address]   Blood Culture (routine x 2)     Status: None (Preliminary result)   Collection Time: 07/23/22  6:05 PM   Specimen: BLOOD  Result Value Ref Range Status   Specimen Description BLOOD SITE NOT SPECIFIED  Final   Special Requests   Final    BOTTLES DRAWN AEROBIC AND  ANAEROBIC Blood Culture adequate volume   Culture   Final    NO GROWTH 3 DAYS Performed at [facility], [address], [place], [place] [address]    Report Status PENDING  Incomplete  Surgical pcr screen     Status: None   Collection Time: 07/24/22 10:55 PM   Specimen: Nasal Mucosa; Nasal Swab  Result Value Ref Range Status   MRSA, PCR NEGATIVE NEGATIVE Final   Staphylococcus aureus NEGATIVE NEGATIVE Final    Comment: (NOTE) The Xpert SA Assay (FDA approved for NASAL specimens in patients 56 years of age and older), is one component of a comprehensive surveillance program. It is not intended to diagnose infection nor to guide or monitor treatment. Performed at [facility], [address], [place], [place] [address]  Aerobic/Anaerobic Culture w Gram Stain (surgical/deep wound)     Status: None (Preliminary result)   Collection Time: 07/25/22 12:31 PM   Specimen: Abscess  Result Value Ref Range Status   Specimen Description ABSCESS  Final   Special Requests NONE  Final   Gram Stain   Final    ABUNDANT WBC PRESENT, PREDOMINANTLY PMN FEW GRAM POSITIVE COCCI IN PAIRS IN CLUSTERS INTRACELLULAR EXTRACELLULAR    Culture   Final    MODERATE STAPHYLOCOCCUS LUGDUNENSIS CULTURE REINCUBATED FOR BETTER GROWTH Performed at [facility], [place] [address], [place], [place] [address]    Report Status PENDING  Incomplete         Radiology Studies: No results found.  Scheduled Meds:  amLODipine  10 mg Oral Daily   vitamin C  1,000 mg Oral Daily   aspirin  81 mg Oral Q1200   dapagliflozin propanediol  5 mg Oral Q1200   docusate sodium  100 mg Oral Daily   insulin aspart  0-15 Units Subcutaneous TID WC   insulin aspart  0-5 Units Subcutaneous QHS   insulin glargine-yfgn  14 Units Subcutaneous QHS   metroNIDAZOLE  500 mg Oral Q12H   nebivolol  5 mg Oral QHS   nutrition supplement (JUVEN)  1 packet Oral BID BM   pantoprazole  40 mg Oral Daily   rosuvastatin  40  mg Oral QHS   sodium chloride flush  3 mL Intravenous Q12H   zinc sulfate  220 mg Oral Daily   Continuous Infusions:  sodium chloride 10 mL/hr at 07/26/22 0726   ceFEPime (MAXIPIME) IV 2 g (07/27/22 0522)   magnesium sulfate bolus IVPB       LOS: 4 days   Time spent: 79min  Fatou Dunnigan C Lasonya Hubner, DO [facility]  If 7PM-7AM, please contact night-coverage [email or web address]  07/27/2022, 8:08 AM

## 2022-07-28 ENCOUNTER — Telehealth (HOSPITAL_COMMUNITY): Payer: Self-pay | Admitting: Pharmacy Technician

## 2022-07-28 ENCOUNTER — Other Ambulatory Visit (HOSPITAL_COMMUNITY): Payer: Self-pay

## 2022-07-28 DIAGNOSIS — D649 Anemia, unspecified: Secondary | ICD-10-CM | POA: Diagnosis not present

## 2022-07-28 DIAGNOSIS — L03119 Cellulitis of unspecified part of limb: Secondary | ICD-10-CM | POA: Diagnosis not present

## 2022-07-28 DIAGNOSIS — L089 Local infection of the skin and subcutaneous tissue, unspecified: Secondary | ICD-10-CM | POA: Diagnosis not present

## 2022-07-28 DIAGNOSIS — E11628 Type 2 diabetes mellitus with other skin complications: Secondary | ICD-10-CM | POA: Diagnosis not present

## 2022-07-28 LAB — CULTURE, BLOOD (ROUTINE X 2)
Culture: NO GROWTH
Culture: NO GROWTH
Special Requests: ADEQUATE
Special Requests: ADEQUATE

## 2022-07-28 LAB — GLUCOSE, CAPILLARY
Glucose-Capillary: 200 mg/dL — ABNORMAL HIGH (ref 70–99)
Glucose-Capillary: 188 mg/dL — ABNORMAL HIGH (ref 70–99)
Glucose-Capillary: 189 mg/dL — ABNORMAL HIGH (ref 70–99)
Glucose-Capillary: 165 mg/dL — ABNORMAL HIGH (ref 70–99)

## 2022-07-28 MED ORDER — OXYCODONE HCL 5 MG PO TABS: 5.0000 mg | ORAL_TABLET | ORAL | 0 refills | Status: DC | PRN

## 2022-07-28 MED ORDER — TRESIBA FLEXTOUCH 100 UNIT/ML ~~LOC~~ SOPN: 15.0000 [IU] | PEN_INJECTOR | Freq: Two times a day (BID) | SUBCUTANEOUS | 0 refills | Status: DC

## 2022-07-28 MED ORDER — LINEZOLID 600 MG PO TABS: 600.0000 mg | ORAL_TABLET | Freq: Two times a day (BID) | ORAL | 0 refills | Status: DC

## 2022-07-28 MED ORDER — CLINDAMYCIN HCL 300 MG PO CAPS: 300.0000 mg | ORAL_CAPSULE | Freq: Three times a day (TID) | ORAL | 0 refills | Status: DC

## 2022-07-28 MED ORDER — OMEPRAZOLE 40 MG PO CPDR: DELAYED_RELEASE_CAPSULE | ORAL | 1 refills | Status: DC

## 2022-07-28 MED ORDER — DOCUSATE SODIUM 100 MG PO CAPS: 100.0000 mg | ORAL_CAPSULE | Freq: Every day | ORAL | 0 refills | Status: AC

## 2022-07-28 MED ORDER — MAGNESIUM CITRATE PO SOLN
1.0000 | Freq: Once | ORAL | Status: AC
  Administered 2022-07-28: 1 via ORAL
  Filled 2022-07-28: qty 296

## 2022-07-28 MED ORDER — POLYETHYLENE GLYCOL 3350 17 G PO PACK: 17.0000 g | PACK | Freq: Two times a day (BID) | ORAL | 0 refills | Status: AC

## 2022-07-28 MED ORDER — BISACODYL 5 MG PO TBEC: 5.0000 mg | DELAYED_RELEASE_TABLET | Freq: Every day | ORAL | 0 refills | Status: AC | PRN

## 2022-07-28 MED ORDER — AMOXICILLIN 500 MG PO TABS: 1000.0000 mg | ORAL_TABLET | Freq: Two times a day (BID) | ORAL | 0 refills | Status: DC

## 2022-07-28 MED ORDER — ALUM & MAG HYDROXIDE-SIMETH 200-200-20 MG/5ML PO SUSP: 15.0000 mL | ORAL | 0 refills | Status: AC | PRN

## 2022-07-28 MED ORDER — ALBUTEROL SULFATE (2.5 MG/3ML) 0.083% IN NEBU: 2.5000 mg | INHALATION_SOLUTION | Freq: Four times a day (QID) | RESPIRATORY_TRACT | 12 refills | Status: DC | PRN

## 2022-07-28 NOTE — TOC Initial Note (Signed)
Transition of Care [facility]) - Initial/Assessment Note    Patient Details  Name: Aaron Franco MRN: [id number] Date of Birth: 1971/07/14  Transition of [name]) CM/SW Contact:    Marilu Favre, RN Phone Number: 07/28/2022, 10:22 AM  Clinical Narrative:                 Spoke to patient at bedside. From home with wife.    Discussed PT recommendations for HHPT , walker and 3 in1. Patient in agreement .   [facility] with [facility] accepted referral for HHPT.   [place] for walker and 3 in 1   Expected Discharge Plan: [place] Barriers to Discharge: Continued Medical Work up   Patient Goals and CMS Choice Patient states their goals for this hospitalization and ongoing recovery are:: to return to home CMS Medicare.gov Compare Post Acute Care list provided to:: Patient Choice offered to / list presented to : Patient  Expected Discharge Plan and Services Expected Discharge Plan: [place]   Discharge Planning Services: CM Consult Post Acute Care Choice: [place] arrangements for the past 2 months: [place]                 DME Arranged: 3-N-1, Walker rolling DME Agency: [employer] Date DME Agency Contacted: 07/28/22 Time DME Agency Contacted: [id number] Representative spoke with at DME Agency: [place]: PT [place]: [place] Date [place]: 07/28/22 Time [place]: 50 Representative spoke with at [place]: [place] Arrangements/Services Living arrangements for the past 2 months: [place] Lives with:: Spouse Patient language and need for interpreter reviewed:: Yes Do you feel safe going back to the place where you live?: Yes      Need for Family Participation in Patient Care: Yes (Comment) Care giver support system in place?: Yes (comment)   Criminal Activity/Legal Involvement Pertinent to Current Situation/Hospitalization: No - Comment as  needed  Activities of Daily Living Home Assistive Devices/Equipment: None ADL Screening (condition at time of admission) Patient's cognitive ability adequate to safely complete daily activities?: Yes Is the patient deaf or have difficulty hearing?: No Does the patient have difficulty seeing, even when wearing glasses/contacts?: No Does the patient have difficulty concentrating, remembering, or making decisions?: No Patient able to express need for assistance with ADLs?: Yes Does the patient have difficulty dressing or bathing?: No Independently performs ADLs?: Yes (appropriate for developmental age) Does the patient have difficulty walking or climbing stairs?: No Weakness of Legs: Left Weakness of Arms/Hands: None  Permission Sought/Granted   Permission granted to share information with : No              Emotional Assessment Appearance:: Appears stated age Attitude/Demeanor/Rapport: Engaged Affect (typically observed): Accepting Orientation: : Oriented to Self, Oriented to Place, Oriented to  Time, Oriented to Situation Alcohol / Substance Use: Not Applicable Psych Involvement: No (comment)  Admission diagnosis:  Diabetic foot infection ([place]) [I45.809, L08.9] Sepsis without acute organ dysfunction, due to unspecified organism [facility]) [A41.9] Patient Active Problem List   Diagnosis Date Noted   Cellulitis and abscess of foot 07/24/2022   Sepsis ([place]) 07/24/2022   Normocytic anemia 07/24/2022   Diabetic foot infection ([place]) 07/23/2022   Chronic venous insufficiency of lower extremity 01/23/2021   Type 2 diabetes mellitus with hyperglycemia, with long-term current use of insulin ([place]) 12/07/2020   Allergic rhinitis due to pollen 06/29/2020   Diabetic neuropathy (  HCC) 03/13/2020   Erectile dysfunction due to arterial insufficiency 03/13/2020   History of gout 11/28/2019   Coronary artery disease involving native heart without angina pectoris 03/29/2019   Onychomycosis  12/01/2017   Obesity (BMI 30-39.9) 02/18/2016   Chest pain with moderate risk of acute coronary syndrome 09/17/2015   Type 2 diabetes mellitus with diabetic neuropathy, unspecified (HCC) 04/23/2015   Thrush 04/23/2015   Type II diabetes mellitus with renal manifestations (HCC) 02/16/2014   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   Hammer toe, acquired 02/16/2014   CAD S/P percutaneous coronary angioplasty -  01/24/2014   Cardiac murmur 02/21/2011   Bilateral lower extremity edema 12/24/2010   Vitamin B12 deficiency 07/19/2010   Anemia due to vitamin B12 deficiency 10/31/2009   Trigger finger 10/16/2009   Dyslipidemia 03/01/2007   Gout 03/01/2007   Essential hypertension 03/01/2007   Gastroesophageal reflux disease 03/01/2007   PCP:  Loyola Mast, MD Pharmacy:   [facility] [phone] - [place],  - [address] AT [facility] [address] [place] [address] Phone: [phone] Fax: [phone]     Social Determinants of Health (SDOH) Interventions    Readmission Risk Interventions     No data to display

## 2022-07-28 NOTE — Discharge Summary (Addendum)
Physician Discharge Summary  [name] XVQ:[id number] DOB: [birth year] DOA: 07/23/2022  PCP: Haydee Salter, MD  Admit date: 07/23/2022 Discharge date: 07/29/2022  Admitted From: Home Disposition:  Home  Recommendations for Outpatient Follow-up:  Follow up with PCP in 1-2 weeks Follow up with Surgery as scheduled for wound vac/post-op dressing changes (will also help manage your pain) Follow up with ID 9/25 with Dr. Candiss Norse for antibiotic evaluation/adjustment as indicated.  Home Health:PT  Equipment/Devices:3in1; walker  Discharge Condition:Stable  CODE STATUS:Full  Diet recommendation: DM diet as discussed    Brief/Interim Summary: Aaron Franco is a 51 y.o. male with medical history significant of  HTN, HLD, CAD s/p PCI, HfpEF, diabetes mellitus type 2, GERD, and obesity who presents with complaints of left foot infection. Patient meets criteria for sepsis at intake, imaging concerning for fluid collection of the lateral foot with edema possible abscess versus osteomyelitis.     Patient tolerated procedure quite well, cultures resulted as below with pan-sensitive staph lugdunesis and enterococcus faecalis. ID consulted for de-escalation to PO antiobiotics recommending Linezolid for 6 weeks (initially placed on amoxicillin but due to questionable delayed reaction earlier this year from unspecified source thought to be related to penicillin will transition to linezolid).  Disposition home with home health/PT 3/1 and rolling walker. Nonweight bearing and wound vac per ortho to continue until they follow in clinic.  Discharge Diagnoses:  Principal Problem:   Diabetic foot infection ([place]) Active Problems:   Cellulitis and abscess of foot   Sepsis ([place])   Cardiac murmur   Type 2 diabetes mellitus with hyperglycemia, with long-term current use of insulin (HCC)   CAD S/P percutaneous coronary angioplasty -    Essential hypertension   Normocytic anemia   Dyslipidemia    Gastroesophageal reflux disease   History of gout   Obesity (BMI 30-39.9)    Discharge Instructions  Discharge Instructions     Ambulatory referral to Nutrition and Diabetic Education   Complete by: As directed    Negative Pressure Wound Therapy - Incisional   Complete by: As directed       Allergies as of 07/28/2022       Reactions   Influenza Vaccines Anaphylaxis   Reaction June 2017   Influenza Virus Vaccine H5n1 Anaphylaxis   Fenofibrate Other (See Comments)   Hot flashes   Metformin Other (See Comments)   Chest discomfort   Prednisone Other (See Comments)   Mood swings   Augmentin [amoxicillin-pot Clavulanate] Rash   07/2022 tolerated cefazolin   Latex Rash        Medication List     STOP taking these medications    gabapentin 600 MG tablet Commonly known as: Neurontin       TAKE these medications    acetaminophen 325 MG tablet Commonly known as: TYLENOL Take 2 tablets (650 mg total) by mouth every 4 (four) hours as needed for headache or mild pain.   albuterol (2.5 MG/3ML) 0.083% nebulizer solution Commonly known as: PROVENTIL Take 3 mLs (2.5 mg total) by nebulization every 6 (six) hours as needed for wheezing or shortness of breath.   allopurinol 100 MG tablet Commonly known as: ZYLOPRIM TAKE 1 TABLET(100 MG) BY MOUTH DAILY What changed:  how much to take how to take this when to take this reasons to take this additional instructions   alum & mag hydroxide-simeth [id number] MG/5ML suspension Commonly known as: MAALOX/MYLANTA Take 15-30 mLs by mouth every 2 (two) hours as needed for indigestion.  amLODipine 10 MG tablet Commonly known as: NORVASC Take 1 tablet (10 mg total) by mouth daily.   aspirin 81 MG chewable tablet Chew 1 tablet (81 mg total) by mouth daily. What changed: when to take this   bisacodyl 5 MG EC tablet Commonly known as: DULCOLAX Take 1 tablet (5 mg total) by mouth daily as needed for moderate constipation.    cyanocobalamin 500 MCG tablet Commonly known as: VITAMIN B12 Take 1 tablet (500 mcg total) by mouth daily. What changed:  when to take this reasons to take this   dapagliflozin propanediol 5 MG Tabs tablet Commonly known as: Farxiga TAKE 1 TABLET(5 MG) BY MOUTH DAILY BREAKFAST What changed:  how much to take how to take this when to take this additional instructions   dexlansoprazole 60 MG capsule Commonly known as: DEXILANT TAKE 1 CAPSULE(60 MG) BY MOUTH DAILY What changed:  how much to take how to take this when to take this additional instructions   docusate sodium 100 MG capsule Commonly known as: COLACE Take 1 capsule (100 mg total) by mouth daily.   fluticasone 50 MCG/ACT nasal spray Commonly known as: FLONASE Place 1 spray into both nostrils daily as needed for allergies or rhinitis.   Insulin Pen Needle 31G X 8 MM Misc 1 Device by Does not apply route in the morning, at noon, in the evening, and at bedtime.   linezolid 600 MG tablet Commonly known as: ZYVOX Take 1 tablet (600 mg total) by mouth 2 (two) times daily for 21 days.   nebivolol 5 MG tablet Commonly known as: BYSTOLIC TAKE 1 TABLET(5 MG) BY MOUTH DAILY What changed:  how much to take how to take this when to take this additional instructions   nitroGLYCERIN 0.4 MG SL tablet Commonly known as: NITROSTAT Place 1 tablet (0.4 mg total) under the tongue every 5 (five) minutes x 3 doses as needed for chest pain.   omeprazole 40 MG capsule Commonly known as: PRILOSEC TAKE 1 CAPSULE(40 MG) BY MOUTH DAILY   oxyCODONE 5 MG immediate release tablet Commonly known as: Oxy IR/ROXICODONE Take 1-2 tablets (5-10 mg total) by mouth every 4 (four) hours as needed for moderate pain (pain score 4-6).   polyethylene glycol 17 g packet Commonly known as: MIRALAX / GLYCOLAX Take 17 g by mouth 2 (two) times daily.   REFRESH DRY EYE THERAPY OP Place 1 drop into both eyes 3 (three) times daily as needed  (for dryness).   Repatha SureClick 140 MG/ML Soaj Generic drug: Evolocumab Inject 140 mLs into the skin every 14 (fourteen) days.   rosuvastatin 40 MG tablet Commonly known as: CRESTOR TAKE 1 TABLET(40 MG) BY MOUTH AT BEDTIME What changed: See the new instructions.   Evaristo Bury FlexTouch 100 UNIT/ML FlexTouch Pen Generic drug: insulin degludec Inject 15 Units into the skin 2 (two) times daily. What changed: how much to take        Follow-up Information     Nadara Mustard, MD Follow up in 1 week(s).   Specialty: Orthopedic Surgery Contact information: [address] [place] [address] [phone]         [facility], [facility] Follow up.   Specialty: Home Health Services Contact information: [address] [place] [address] [phone]                Allergies  Allergen Reactions   Influenza Vaccines Anaphylaxis    Reaction June 2017   Influenza Virus Vaccine H5n1 Anaphylaxis  Fenofibrate Other (See Comments)    Hot flashes   Metformin Other (See Comments)    Chest discomfort   Prednisone Other (See Comments)    Mood swings   Augmentin [Amoxicillin-Pot Clavulanate] Rash    07/2022 tolerated cefazolin   Latex Rash    Consultations: Ortho, ID   Procedures/Studies: DG Foot Complete Left  Result Date: 07/25/2022 CLINICAL DATA:  Left foot swollen red for the past 7 days. No signs of an abscess. EXAM: LEFT FOOT - COMPLETE 3+ VIEW COMPARISON:  Same day MRI left forefoot 07/23/2022 FINDINGS: Mild great toe metatarsophalangeal joint space narrowing and peripheral osteophytosis. Small chronic ossicle at the lateral base of the proximal phalanx of the great toe. Mild chronic enthesopathic change at the peroneus brevis insertion on the base of the fifth metatarsal. Moderate posterior and mild plantar calcaneal heel spurs. Moderate soft tissue swelling just dorsal lateral to the fifth metatarsal neck and metatarsal head. No definitive  cortical erosion is seen on radiographs, however note is made of minimal early cortical erosion suggested within the dorsal lateral aspect of the fifth metatarsal head as well as distal fifth metatarsal marrow edema on MRI performed later same day. IMPRESSION: There is soft tissue swelling dorsal and lateral to the distal fifth metatarsal corresponding to the fluid collection/abscess seen on same day MRI. No definitive radiographic evidence of fifth metatarsal cortical erosion, however there is suggestion of mild fifth metatarsal head cortical erosion on the more sensitive same-day MRI. Electronically Signed   By: Neita Garnet M.D.   On: 07/25/2022 10:04   ECHOCARDIOGRAM COMPLETE  Result Date: 07/24/2022    ECHOCARDIOGRAM REPORT   Patient Name:   Aaron Franco Date of Exam: 07/24/2022 Medical Rec #:  [id number]        Height:       72.0 in Accession #:    [id number]       Weight:       289.0 lb Date of Birth:  09-Jan-1971        BSA:          2.491 m Patient Age:    51 years         BP:           137/83 mmHg Patient Gender: M                HR:           89 bpm. Exam Location:  Inpatient Procedure: 2D Echo, Color Doppler and Cardiac Doppler Indications:    Murmur  History:        Patient has prior history of Echocardiogram examinations, most                 recent 04/27/2017. CAD; Risk Factors:Diabetes, Hypertension and                 Dyslipidemia.  Sonographer:    Gaynell Face Referring Phys: [id number] RONDELL A SMITH IMPRESSIONS  1. Left ventricular ejection fraction, by estimation, is 60 to 65%. Left ventricular ejection fraction by PLAX is 63 %. The left ventricle has normal function. The left ventricle has no regional wall motion abnormalities. There is moderate concentric left ventricular hypertrophy of the basal-septal segment. Left ventricular diastolic parameters are consistent with Grade I diastolic dysfunction (impaired relaxation).  2. Right ventricular systolic function is normal. The right  ventricular size is normal. Tricuspid regurgitation signal is inadequate for assessing PA pressure.  3. The mitral valve is grossly normal. Trivial mitral  valve regurgitation.  4. The aortic valve is tricuspid. Aortic valve regurgitation is not visualized. Aortic valve sclerosis/calcification is present, without any evidence of aortic stenosis. Aortic valve mean gradient measures 8.0 mmHg.  5. The inferior vena cava is normal in size with <50% respiratory variability, suggesting right atrial pressure of 8 mmHg. Comparison(s): No significant change from prior study. 04/27/2017: LVEF 60-65%, moderate LVH, grade 1 DD. FINDINGS  Left Ventricle: Left ventricular ejection fraction, by estimation, is 60 to 65%. Left ventricular ejection fraction by PLAX is 63 %. The left ventricle has normal function. The left ventricle has no regional wall motion abnormalities. The left ventricular internal cavity size was normal in size. There is moderate concentric left ventricular hypertrophy of the basal-septal segment. Left ventricular diastolic parameters are consistent with Grade I diastolic dysfunction (impaired relaxation). Indeterminate filling pressures. Right Ventricle: The right ventricular size is normal. No increase in right ventricular wall thickness. Right ventricular systolic function is normal. Tricuspid regurgitation signal is inadequate for assessing PA pressure. Left Atrium: Left atrial size was normal in size. Right Atrium: Right atrial size was normal in size. Pericardium: There is no evidence of pericardial effusion. Mitral Valve: The mitral valve is grossly normal. Trivial mitral valve regurgitation. Tricuspid Valve: The tricuspid valve is normal in structure. Tricuspid valve regurgitation is not demonstrated. Aortic Valve: The aortic valve is tricuspid. Aortic valve regurgitation is not visualized. Aortic valve sclerosis/calcification is present, without any evidence of aortic stenosis. Aortic valve mean gradient  measures 8.0 mmHg. Aortic valve peak gradient measures 14.7 mmHg. Aortic valve area, by VTI measures 2.50 cm. Pulmonic Valve: The pulmonic valve was normal in structure. Pulmonic valve regurgitation is not visualized. Aorta: The aortic root and ascending aorta are structurally normal, with no evidence of dilitation. Venous: The inferior vena cava is normal in size with less than 50% respiratory variability, suggesting right atrial pressure of 8 mmHg. IAS/Shunts: No atrial level shunt detected by color flow Doppler.  LEFT VENTRICLE PLAX 2D LV EF:         Left            Diastology                ventricular     LV e' medial:    10.80 cm/s                ejection        LV E/e' medial:  8.0                fraction by     LV e' lateral:   8.70 cm/s                PLAX is 63      LV E/e' lateral: 9.9                %. LVIDd:         4.70 cm LVIDs:         3.10 cm LV PW:         1.00 cm LV IVS:        1.40 cm LVOT diam:     2.40 cm LV SV:         111 LV SV Index:   44 LVOT Area:     4.52 cm  RIGHT VENTRICLE TAPSE (M-mode): 2.0 cm LEFT ATRIUM             Index        RIGHT ATRIUM  Index LA diam:        4.70 cm 1.89 cm/m   RA Area:     15.20 cm LA Vol (A2C):   78.9 ml 31.67 ml/m  RA Volume:   33.70 ml  13.53 ml/m LA Vol (A4C):   60.7 ml 24.36 ml/m LA Biplane Vol: 69.9 ml 28.06 ml/m  AORTIC VALVE AV Area (Vmax):    2.50 cm AV Area (Vmean):   2.73 cm AV Area (VTI):     2.50 cm AV Vmax:           192.00 cm/s AV Vmean:          134.000 cm/s AV VTI:            0.443 m AV Peak Grad:      14.7 mmHg AV Mean Grad:      8.0 mmHg LVOT Vmax:         106.00 cm/s LVOT Vmean:        81.000 cm/s LVOT VTI:          0.245 m LVOT/AV VTI ratio: 0.55  AORTA Ao Asc diam: 3.40 cm MITRAL VALVE MV Area (PHT): 2.96 cm    SHUNTS MV Decel Time: 256 msec    Systemic VTI:  0.24 m MV E velocity: 86.50 cm/s  Systemic Diam: 2.40 cm MV A velocity: 81.00 cm/s MV E/A ratio:  1.07 Zoila Shutter MD Electronically signed by Zoila Shutter MD  Signature Date/Time: 07/24/2022/1:53:41 PM    Final    MRI Left foot without contrast  Result Date: 07/23/2022 CLINICAL DATA:  Foot swelling, diabetic, osteomyelitis suspected. EXAM: MRI OF THE LEFT FOOT WITHOUT CONTRAST TECHNIQUE: Multiplanar, multisequence MR imaging of the left was performed. No intravenous contrast was administered. COMPARISON:  Radiograph performed earlier on the same date. FINDINGS: Bones/Joint/Cartilage Bone marrow edema of the fifth metatarsal head and proximal phalanx of the fifth digit with small joint effusion. There is also mild marrow edema of the fourth metatarsal head without evidence of joint effusion. There are subchondral cystic changes at the first metatarsophalangeal joint. Marrow signal within remaining osseous structures is within normal limits. Ligaments Lisfranc and collateral ligaments are intact. 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 Soft tissues Marked subcutaneous soft tissue edema about the ankle and foot. There is a fluid collection about the dorsal aspect of the fourth and fifth metatarsals which extends into the space between the metatarsal heads measures at least 1.5 x 3.4 x 3.3 cm, suggesting abscess. IMPRESSION: 1. Bone marrow edema of the fifth metatarsal head and proximal phalanx of the fifth digit with small joint effusion, as well as marrow edema of the fourth metatarsal head. In the presence of adjacent fluid collection/abscess the findings are concerning for early osteomyelitis. 2. 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 3. Fluid collection about the dorsal aspect of the fourth and fifth metatarsal heads which extends into the space between the metatarsal heads concerning for abscess, measuring approximately 1.5 x 3.4 x 3.3 cm. Electronically Signed   By: Larose Hires D.O.   On: 07/23/2022 21:51   DG Chest Port 1 View  Result  Date: 07/23/2022 CLINICAL DATA:  Sepsis EXAM: PORTABLE CHEST 1 VIEW COMPARISON:  12/27/2020 FINDINGS: The heart size and mediastinal contours are within normal limits. Both lungs are clear. The visualized skeletal structures are unremarkable. IMPRESSION: No active disease. Electronically Signed   By: Deatra Robinson M.D.   On: 07/23/2022 19:34  VAS [place] LOWER EXTREMITY VENOUS (DVT)  Result Date: 07/19/2022  Lower Venous DVT Study Patient Name:  JASTEN GUYETTE  Date of Exam:   07/18/2022 Medical Rec #: [id number]         Accession #:    [id number] Date of Birth: 23-Sep-1971         Patient Gender: M Patient Age:   6 years Exam Location:  [facility] Procedure:      VAS [place] LOWER EXTREMITY VENOUS (DVT) Referring Phys: Fanny Bien --------------------------------------------------------------------------------  Indications: Swelling left>right with.  Comparison Study: No prior studies. Performing Technologist: Jean Rosenthal RDMS, RVT  Examination Guidelines: A complete evaluation includes B-mode imaging, spectral Doppler, color Doppler, and power Doppler as needed of all accessible portions of each vessel. Bilateral testing is considered an integral part of a complete examination. Limited examinations for reoccurring indications may be performed as noted. The reflux portion of the exam is performed with the patient in reverse Trendelenburg.  +---------+---------------+---------+-----------+----------+--------------+ RIGHT    CompressibilityPhasicitySpontaneityPropertiesThrombus Aging +---------+---------------+---------+-----------+----------+--------------+ CFV      Full           Yes      Yes                                 +---------+---------------+---------+-----------+----------+--------------+ SFJ      Full                                                        +---------+---------------+---------+-----------+----------+--------------+ FV Prox  Full                                                         +---------+---------------+---------+-----------+----------+--------------+ FV Mid   Full                                                        +---------+---------------+---------+-----------+----------+--------------+ FV DistalFull                                                        +---------+---------------+---------+-----------+----------+--------------+ PFV      Full                                                        +---------+---------------+---------+-----------+----------+--------------+ POP      Full           Yes      Yes                                 +---------+---------------+---------+-----------+----------+--------------+ PTV  Full                                                        +---------+---------------+---------+-----------+----------+--------------+ PERO     Full                                                        +---------+---------------+---------+-----------+----------+--------------+ Gastroc  Full                                                        +---------+---------------+---------+-----------+----------+--------------+   +---------+---------------+---------+-----------+----------+--------------+ LEFT     CompressibilityPhasicitySpontaneityPropertiesThrombus Aging +---------+---------------+---------+-----------+----------+--------------+ CFV      Full           Yes      Yes                                 +---------+---------------+---------+-----------+----------+--------------+ SFJ      Full                                                        +---------+---------------+---------+-----------+----------+--------------+ FV Prox  Full                                                        +---------+---------------+---------+-----------+----------+--------------+ FV Mid   Full                                                         +---------+---------------+---------+-----------+----------+--------------+ FV DistalFull                                                        +---------+---------------+---------+-----------+----------+--------------+ PFV      Full                                                        +---------+---------------+---------+-----------+----------+--------------+ POP      Full           Yes      Yes                                 +---------+---------------+---------+-----------+----------+--------------+  PTV      Full                                                        +---------+---------------+---------+-----------+----------+--------------+ PERO     Full                                                        +---------+---------------+---------+-----------+----------+--------------+ Gastroc  Full                                                        +---------+---------------+---------+-----------+----------+--------------+     Summary: RIGHT: - There is no evidence of deep vein thrombosis in the lower extremity.  - No cystic structure found in the popliteal fossa.  LEFT: - There is no evidence of deep vein thrombosis in the lower extremity.  - No cystic structure found in the popliteal fossa.  - Ultrasound characteristics of enlarged lymph nodes noted in the groin. Hyperemic. Largest measuring 3.9 cm.  *See table(s) above for measurements and observations. Electronically signed by Gerarda Fraction on 07/19/2022 at 10:40:52 AM.    Final      Subjective: No acute issues/events overnight   Discharge Exam: Vitals:   07/28/22 0422 07/28/22 0722  BP: 133/75 136/85  Pulse: 77 80  Resp: 16 16  Temp: 98.7 F (37.1 C) 98.7 F (37.1 C)  SpO2: 96% 99%   Vitals:   07/27/22 1530 07/27/22 2049 07/28/22 0422 07/28/22 0722  BP: 126/75 138/82 133/75 136/85  Pulse: 84 86 77 80  Resp: Temp: 98.7 F (37.1 C) (!) 100.9 F (38.3 C) 98.7 F (37.1 C) 98.7 F  (37.1 C)  TempSrc: Oral Oral Oral Oral  SpO2: 97% 98% 96% 99%  Weight:      Height:        General: Pt is alert, awake, not in acute distress Cardiovascular: RRR, S1/S2 +, no rubs, no gallops Respiratory: CTA bilaterally, no wheezing, no rhonchi Abdominal: Soft, NT, ND, bowel sounds + Extremities: no edema, no cyanosis, 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   The results of significant diagnostics from this hospitalization (including imaging, microbiology, ancillary and laboratory) are listed below for reference.     Microbiology: Recent Results (from the past 240 hour(s))  Blood Culture (routine x 2)     Status: None   Collection Time: 07/23/22  5:58 PM   Specimen: BLOOD  Result Value Ref Range Status   Specimen Description BLOOD BLOOD RIGHT FOREARM  Final   Special Requests   Final    BOTTLES DRAWN AEROBIC AND ANAEROBIC Blood Culture adequate volume   Culture   Final    NO GROWTH 5 DAYS Performed at [facility], [address], [place], [place] [address]    Report Status 07/28/2022 FINAL  Final  Resp Panel by RT-PCR (Flu A&B, Covid) Anterior Nasal Swab     Status: None   Collection Time: 07/23/22  6:00 PM   Specimen:  Anterior Nasal Swab  Result Value Ref Range Status   SARS Coronavirus 2 by RT PCR NEGATIVE NEGATIVE Final    Comment: (NOTE) SARS-CoV-2 target nucleic acids are NOT DETECTED.  The SARS-CoV-2 RNA is generally detectable in upper respiratory specimens during the acute phase of infection. The lowest concentration of SARS-CoV-2 viral copies this assay can detect is 138 copies/mL. A negative result does not preclude SARS-Cov-2 infection and should not be used as the sole basis for treatment or other patient management decisions. A negative result may occur with  improper specimen collection/handling, submission of specimen other than nasopharyngeal swab, presence of viral mutation(s) within the areas targeted by this assay, and inadequate  number of viral copies(<138 copies/mL). A negative result must be combined with clinical observations, patient history, and epidemiological information. The expected result is Negative.  Fact Sheet for Patients:  BloggerCourse.com  Fact Sheet for Healthcare Providers:  SeriousBroker.it  This test is no t yet approved or cleared by the [place] FDA and  has been authorized for detection and/or diagnosis of SARS-CoV-2 by FDA under an Emergency Use Authorization (EUA). This EUA will remain  in effect (meaning this test can be used) for the duration of the COVID-19 declaration under Section 564(b)(1) of the Act, 21 U.S.C.section 360bbb-3(b)(1), unless the authorization is terminated  or revoked sooner.       Influenza A by PCR NEGATIVE NEGATIVE Final   Influenza B by PCR NEGATIVE NEGATIVE Final    Comment: (NOTE) The Xpert Xpress SARS-CoV-2/FLU/RSV plus assay is intended as an aid in the diagnosis of influenza from Nasopharyngeal swab specimens and should not be used as a sole basis for treatment. Nasal washings and aspirates are unacceptable for Xpert Xpress SARS-CoV-2/FLU/RSV testing.  Fact Sheet for Patients: BloggerCourse.com  Fact Sheet for Healthcare Providers: SeriousBroker.it  This test is not yet approved or cleared by the [place] FDA and has been authorized for detection and/or diagnosis of SARS-CoV-2 by FDA under an Emergency Use Authorization (EUA). This EUA will remain in effect (meaning this test can be used) for the duration of the COVID-19 declaration under Section 564(b)(1) of the Act, 21 U.S.C. section 360bbb-3(b)(1), unless the authorization is terminated or revoked.  Performed at [facility], [address] [place], [place] [address]   Blood Culture (routine x 2)     Status: None   Collection Time: 07/23/22  6:05 PM   Specimen: BLOOD   Result Value Ref Range Status   Specimen Description BLOOD SITE NOT SPECIFIED  Final   Special Requests   Final    BOTTLES DRAWN AEROBIC AND ANAEROBIC Blood Culture adequate volume   Culture   Final    NO GROWTH 5 DAYS Performed at [facility], [address], [place], [place] [address]    Report Status 07/28/2022 FINAL  Final  Surgical pcr screen     Status: None   Collection Time: 07/24/22 10:55 PM   Specimen: Nasal Mucosa; Nasal Swab  Result Value Ref Range Status   MRSA, PCR NEGATIVE NEGATIVE Final   Staphylococcus aureus NEGATIVE NEGATIVE Final    Comment: (NOTE) The Xpert SA Assay (FDA approved for NASAL specimens in patients 30 years of age and older), is one component of a comprehensive surveillance program. It is not intended to diagnose infection nor to guide or monitor treatment. Performed at [facility], [address] [place], [place] [address]   Aerobic/Anaerobic Culture w Gram Stain (surgical/deep wound)  Status: None (Preliminary result)   Collection Time: 07/25/22 12:31 PM   Specimen: Abscess  Result Value Ref Range Status   Specimen Description ABSCESS  Final   Special Requests NONE  Final   Gram Stain   Final    ABUNDANT WBC PRESENT, PREDOMINANTLY PMN FEW GRAM POSITIVE COCCI IN PAIRS IN CLUSTERS INTRACELLULAR EXTRACELLULAR Performed at [facility], [address], [place], [place] [address]    Culture   Final    MODERATE STAPHYLOCOCCUS LUGDUNENSIS FEW ENTEROCOCCUS FAECALIS MODERATE DIPHTHEROIDS(CORYNEBACTERIUM SPECIES) Standardized susceptibility testing for this organism is not available. NO ANAEROBES ISOLATED; CULTURE IN PROGRESS FOR 5 DAYS    Report Status PENDING  Incomplete   Organism ID, Bacteria STAPHYLOCOCCUS LUGDUNENSIS  Final   Organism ID, Bacteria ENTEROCOCCUS FAECALIS  Final      Susceptibility   Enterococcus faecalis - MIC*    AMPICILLIN <=2 SENSITIVE Sensitive     VANCOMYCIN 1 SENSITIVE Sensitive      GENTAMICIN SYNERGY SENSITIVE Sensitive     * FEW ENTEROCOCCUS FAECALIS   Staphylococcus lugdunensis - MIC*    CIPROFLOXACIN <=0.5 SENSITIVE Sensitive     ERYTHROMYCIN <=0.25 SENSITIVE Sensitive     GENTAMICIN <=0.5 SENSITIVE Sensitive     OXACILLIN 2 SENSITIVE Sensitive     TETRACYCLINE <=1 SENSITIVE Sensitive     VANCOMYCIN 1 SENSITIVE Sensitive     TRIMETH/SULFA <=10 SENSITIVE Sensitive     CLINDAMYCIN <=0.25 SENSITIVE Sensitive     RIFAMPIN <=0.5 SENSITIVE Sensitive     Inducible Clindamycin NEGATIVE Sensitive     * MODERATE STAPHYLOCOCCUS LUGDUNENSIS     Labs: BNP (last 3 results) No results for input(s): "BNP" in the last 8760 hours. Basic Metabolic Panel: Recent Labs  Lab 07/23/22 1758 07/24/22 0021 07/25/22 0542 07/27/22 0654  NA 138 136 134* 135  K 4.1 4.0 4.0 4.3  CL 102 106 102 103  CO2 GLUCOSE 239* 309* 178* 172*  BUN CREATININE 0.90 0.80 0.78 0.65  CALCIUM 9.6 8.5* 8.7* 8.5*   Liver Function Tests: Recent Labs  Lab 07/23/22 1758  AST 14*  ALT 14  ALKPHOS 91  BILITOT 0.7  PROT 8.7*  ALBUMIN 3.1*   No results for input(s): "LIPASE", "AMYLASE" in the last 168 hours. No results for input(s): "AMMONIA" in the last 168 hours. CBC: Recent Labs  Lab 07/23/22 1758 07/24/22 0021 07/25/22 0542 07/27/22 0654  WBC 14.6* 12.0* 12.2* 11.3*  NEUTROABS 11.4*  --   --   --   HGB 12.7* 11.3* 11.1* 9.9*  HCT 41.2 35.9* 34.8* 30.8*  MCV 83.9 83.5 80.9 80.0  PLT 360 289 294 334   Cardiac Enzymes: No results for input(s): "CKTOTAL", "CKMB", "CKMBINDEX", "TROPONINI" in the last 168 hours. BNP: Invalid input(s): "POCBNP" CBG: Recent Labs  Lab 07/27/22 2050 07/28/22 0900 07/28/22 1305 07/28/22 1350 07/28/22 1619  GLUCAP 173* 200* 188* 189* 165*   D-Dimer No results for input(s): "DDIMER" in the last 72 hours. Hgb A1c No results for input(s): "HGBA1C" in the last 72 hours. Lipid Profile No results for input(s): "CHOL", "HDL",  "LDLCALC", "TRIG", "CHOLHDL", "LDLDIRECT" in the last 72 hours. Thyroid function studies No results for input(s): "TSH", "T4TOTAL", "T3FREE", "THYROIDAB" in the last 72 hours.  Invalid input(s): "FREET3" Anemia work up No results for input(s): "VITAMINB12", "FOLATE", "FERRITIN", "TIBC", "IRON", "RETICCTPCT" in the last 72 hours. Urinalysis    Component Value Date/Time   COLORURINE YELLOW 07/23/2022 2219  APPEARANCEUR CLEAR 07/23/2022 2219   LABSPEC 1.031 (H) 07/23/2022 2219   PHURINE 6.0 07/23/2022 2219   GLUCOSEU >=500 (A) 07/23/2022 2219   GLUCOSEU >=1000 (A) 03/15/2020 0759   HGBUR NEGATIVE 07/23/2022 2219   HGBUR negative 10/16/2009 1041   BILIRUBINUR NEGATIVE 07/23/2022 2219   KETONESUR 80 (A) 07/23/2022 2219   PROTEINUR 30 (A) 07/23/2022 2219   UROBILINOGEN 0.2 03/15/2020 0759   NITRITE NEGATIVE 07/23/2022 2219   LEUKOCYTESUR NEGATIVE 07/23/2022 2219   Sepsis Labs Recent Labs  Lab 07/23/22 1758 07/24/22 0021 07/25/22 0542 07/27/22 0654  WBC 14.6* 12.0* 12.2* 11.3*   Microbiology Recent Results (from the past 240 hour(s))  Blood Culture (routine x 2)     Status: None   Collection Time: 07/23/22  5:58 PM   Specimen: BLOOD  Result Value Ref Range Status   Specimen Description BLOOD BLOOD RIGHT FOREARM  Final   Special Requests   Final    BOTTLES DRAWN AEROBIC AND ANAEROBIC Blood Culture adequate volume   Culture   Final    NO GROWTH 5 DAYS Performed at [facility], [address] [place], [place] [address]    Report Status 07/28/2022 FINAL  Final  Resp Panel by RT-PCR (Flu A&B, Covid) Anterior Nasal Swab     Status: None   Collection Time: 07/23/22  6:00 PM   Specimen: Anterior Nasal Swab  Result Value Ref Range Status   SARS Coronavirus 2 by RT PCR NEGATIVE NEGATIVE Final    Comment: (NOTE) SARS-CoV-2 target nucleic acids are NOT DETECTED.  The SARS-CoV-2 RNA is generally detectable in upper respiratory specimens during the acute phase of  infection. The lowest concentration of SARS-CoV-2 viral copies this assay can detect is 138 copies/mL. A negative result does not preclude SARS-Cov-2 infection and should not be used as the sole basis for treatment or other patient management decisions. A negative result may occur with  improper specimen collection/handling, submission of specimen other than nasopharyngeal swab, presence of viral mutation(s) within the areas targeted by this assay, and inadequate number of viral copies(<138 copies/mL). A negative result must be combined with clinical observations, patient history, and epidemiological information. The expected result is Negative.  Fact Sheet for Patients:  BloggerCourse.comhttps://www.fda.gov/media/152166/download  Fact Sheet for Healthcare Providers:  SeriousBroker.ithttps://www.fda.gov/media/152162/download  This test is no t yet approved or cleared by the [place] FDA and  has been authorized for detection and/or diagnosis of SARS-CoV-2 by FDA under an Emergency Use Authorization (EUA). This EUA will remain  in effect (meaning this test can be used) for the duration of the COVID-19 declaration under Section 564(b)(1) of the Act, 21 U.S.C.section 360bbb-3(b)(1), unless the authorization is terminated  or revoked sooner.       Influenza A by PCR NEGATIVE NEGATIVE Final   Influenza B by PCR NEGATIVE NEGATIVE Final    Comment: (NOTE) The Xpert Xpress SARS-CoV-2/FLU/RSV plus assay is intended as an aid in the diagnosis of influenza from Nasopharyngeal swab specimens and should not be used as a sole basis for treatment. Nasal washings and aspirates are unacceptable for Xpert Xpress SARS-CoV-2/FLU/RSV testing.  Fact Sheet for Patients: BloggerCourse.comhttps://www.fda.gov/media/152166/download  Fact Sheet for Healthcare Providers: SeriousBroker.ithttps://www.fda.gov/media/152162/download  This test is not yet approved or cleared by the [place] FDA and has been authorized for detection and/or diagnosis of SARS-CoV-2  by FDA under an Emergency Use Authorization (EUA). This EUA will remain in effect (meaning this test can be used) for the duration of the COVID-19 declaration under Section  564(b)(1) of the Act, 21 U.S.C. section 360bbb-3(b)(1), unless the authorization is terminated or revoked.  Performed at [facility], [address] [place], [place] [address]   Blood Culture (routine x 2)     Status: None   Collection Time: 07/23/22  6:05 PM   Specimen: BLOOD  Result Value Ref Range Status   Specimen Description BLOOD SITE NOT SPECIFIED  Final   Special Requests   Final    BOTTLES DRAWN AEROBIC AND ANAEROBIC Blood Culture adequate volume   Culture   Final    NO GROWTH 5 DAYS Performed at [facility], [address] [place], [place] [address]    Report Status 07/28/2022 FINAL  Final  Surgical pcr screen     Status: None   Collection Time: 07/24/22 10:55 PM   Specimen: Nasal Mucosa; Nasal Swab  Result Value Ref Range Status   MRSA, PCR NEGATIVE NEGATIVE Final   Staphylococcus aureus NEGATIVE NEGATIVE Final    Comment: (NOTE) The Xpert SA Assay (FDA approved for NASAL specimens in patients 49 years of age and older), is one component of a comprehensive surveillance program. It is not intended to diagnose infection nor to guide or monitor treatment. Performed at [facility], [address], [place], [place] [address]   Aerobic/Anaerobic Culture w Gram Stain (surgical/deep wound)     Status: None (Preliminary result)   Collection Time: 07/25/22 12:31 PM   Specimen: Abscess  Result Value Ref Range Status   Specimen Description ABSCESS  Final   Special Requests NONE  Final   Gram Stain   Final    ABUNDANT WBC PRESENT, PREDOMINANTLY PMN FEW GRAM POSITIVE COCCI IN PAIRS IN CLUSTERS INTRACELLULAR EXTRACELLULAR Performed at [facility], [address] [place], [place] [address]    Culture   Final    MODERATE STAPHYLOCOCCUS LUGDUNENSIS FEW ENTEROCOCCUS  FAECALIS MODERATE DIPHTHEROIDS(CORYNEBACTERIUM SPECIES) Standardized susceptibility testing for this organism is not available. NO ANAEROBES ISOLATED; CULTURE IN PROGRESS FOR 5 DAYS    Report Status PENDING  Incomplete   Organism ID, Bacteria STAPHYLOCOCCUS LUGDUNENSIS  Final   Organism ID, Bacteria ENTEROCOCCUS FAECALIS  Final      Susceptibility   Enterococcus faecalis - MIC*    AMPICILLIN <=2 SENSITIVE Sensitive     VANCOMYCIN 1 SENSITIVE Sensitive     GENTAMICIN SYNERGY SENSITIVE Sensitive     * FEW ENTEROCOCCUS FAECALIS   Staphylococcus lugdunensis - MIC*    CIPROFLOXACIN <=0.5 SENSITIVE Sensitive     ERYTHROMYCIN <=0.25 SENSITIVE Sensitive     GENTAMICIN <=0.5 SENSITIVE Sensitive     OXACILLIN 2 SENSITIVE Sensitive     TETRACYCLINE <=1 SENSITIVE Sensitive     VANCOMYCIN 1 SENSITIVE Sensitive     TRIMETH/SULFA <=10 SENSITIVE Sensitive     CLINDAMYCIN <=0.25 SENSITIVE Sensitive     RIFAMPIN <=0.5 SENSITIVE Sensitive     Inducible Clindamycin NEGATIVE Sensitive     * MODERATE STAPHYLOCOCCUS LUGDUNENSIS     Time coordinating discharge: Over 30 minutes  SIGNED:   Azucena Fallen, DO [facility] 07/29/2022, 4:53 PM Pager   If 7PM-7AM, please contact night-coverage [email or web address]

## 2022-07-28 NOTE — Progress Notes (Signed)
Patient ID: Aaron Franco, male   DOB: 1971/04/12, 51 y.o.   MRN: [id number] Patient is status post transmetatarsal amputation for osteomyelitis and abscess.  Cultures are showing staph which is pansensitive and Enterococcus with pending cultures.  Anticipate once the culture sensitivities are available patient can be discharged on oral antibiotics.  Patient will discharge with the Praveena plus portable wound VAC pump.

## 2022-07-28 NOTE — Progress Notes (Signed)
Physical Therapy Treatment Patient Details Name: Aaron Franco MRN: [id number] DOB: 08/15/1971 Today's Date: 07/28/2022   History of Present Illness 51 yo male admitted 9/13 with left foot infection s/p left transmet amputation 9/15. PMHx: HTN, HLD, CAD, HFpEF, DM, GERD, obesity    PT Comments    Patient progressing well towards PT goals. Session focused on progressive ambulation and mobility. Tolerated standing with Min guard assist and improved ambulation distance this session as well. Able to maintain NWB LLE throughout session. Instructed pt in there ex to perform as well as how to negotiate 1 step up backwards with RW to safely enter home. Re-adjusted post op shoe for better fit.  Pt has support from wife at home. Will follow if still in the hospital.   Recommendations for follow up therapy are one component of a multi-disciplinary discharge planning process, led by the attending physician.  Recommendations may be updated based on patient status, additional functional criteria and insurance authorization.  Follow Up Recommendations  Home health PT     Assistance Recommended at Discharge Intermittent Supervision/Assistance  Patient can return home with the following Assistance with cooking/housework;Assist for transportation;Help with stairs or ramp for entrance   Equipment Recommendations  Rolling walker (2 wheels);BSC/3in1 (already delievered)    Recommendations for Other Services       Precautions / Restrictions Precautions Precautions: Fall;Other (comment) Precaution Comments: VAC Required Braces or Orthoses: Other Brace Other Brace: post op shoe Restrictions Weight Bearing Restrictions: Yes LLE Weight Bearing: Non weight bearing     Mobility  Bed Mobility               General bed mobility comments: Sitting EOB upon PT arrival.    Transfers Overall transfer level: Needs assistance Equipment used: Rolling walker (2 wheels) Transfers: Sit to/from  Stand Sit to Stand: Min guard           General transfer comment: Min guard for safety. Cues for hand placement. Stood from EOB x1 maintaining NWB through RLE.    Ambulation/Gait Ambulation/Gait assistance: Min guard Gait Distance (Feet): 40 Feet Assistive device: Rolling walker (2 wheels) Gait Pattern/deviations: Step-to pattern Gait velocity: decreased Gait velocity interpretation: <1.8 ft/sec, indicate of risk for recurrent falls   General Gait Details: "hop to" gait pattern with cues to roll RW and not pick it up as well as proper foot placement when hopping. Able to maintain NWB LLE. Fatigues.   Stairs Stairs:  (Discussed how to hop up backwards 1 step)           Wheelchair Mobility    Modified Rankin (Stroke Patients Only)       Balance Overall balance assessment: Needs assistance Sitting-balance support: Feet supported, No upper extremity supported Sitting balance-Leahy Scale: Good Sitting balance - Comments: ASsist to donn post op shoe- adjusted fit due to it being too tight   Standing balance support: During functional activity, Reliant on assistive device for balance Standing balance-Leahy Scale: Poor                              Cognition Arousal/Alertness: Awake/alert Behavior During Therapy: WFL for tasks assessed/performed Overall Cognitive Status: Within Functional Limits for tasks assessed                                          Exercises General Exercises -  Lower Extremity Ankle Circles/Pumps: AROM, Left, 5 reps, Seated Long Arc Quad: AROM, Left, 5 reps, Seated    General Comments        Pertinent Vitals/Pain Pain Assessment Pain Assessment: Faces Faces Pain Scale: Hurts a little bit Pain Location: left foot Pain Descriptors / Indicators: Sore Pain Intervention(s): Monitored during session    Home Living                          Prior Function            PT Goals (current goals can  now be found in the care plan section) Progress towards PT goals: Progressing toward goals    Frequency    Min 3X/week      PT Plan Current plan remains appropriate    Co-evaluation              AM-PAC PT "6 Clicks" Mobility   Outcome Measure  Help needed turning from your back to your side while in a flat bed without using bedrails?: None Help needed moving from lying on your back to sitting on the side of a flat bed without using bedrails?: None Help needed moving to and from a bed to a chair (including a wheelchair)?: A Little Help needed standing up from a chair using your arms (e.g., wheelchair or bedside chair)?: A Little Help needed to walk in hospital room?: A Little Help needed climbing 3-5 steps with a railing? : A Lot 6 Click Score: 19    End of Session Equipment Utilized During Treatment: Gait belt Activity Tolerance: Patient tolerated treatment well Patient left: in bed;with call bell/phone within reach (sitting EOB) Nurse Communication: Mobility status PT Visit Diagnosis: Other abnormalities of gait and mobility (R26.89);Difficulty in walking, not elsewhere classified (R26.2)     Time: 0867-6195 PT Time Calculation (min) (ACUTE ONLY): 24 min  Charges:  $Gait Training: 8-22 mins $Therapeutic Activity: 8-22 mins                     Marisa Severin, PT, DPT Acute Rehabilitation Services Secure chat preferred Office [place] 07/28/2022, 2:48 PM

## 2022-07-28 NOTE — Telephone Encounter (Signed)
Pharmacy Patient Advocate Encounter  Insurance verification completed.    The patient is insured through [employer]   The patient is currently admitted and ran test claims for the following: Linezolid 600mg .  Copays and coinsurance results were relayed to Inpatient clinical team.   Pt also has [employer] ins. Limit to 28 tabs in 365 days.

## 2022-07-28 NOTE — TOC Benefit Eligibility Note (Signed)
Patient Research scientist (life sciences) completed.     The patient is currently admitted and upon discharge could be taking Linezolid 600MG .   The current 21 day co-pay is, $16.   The patient is insured through [employer].  Pt also has [employer] ins. Limit to 28 tabs in 365 days.

## 2022-07-28 NOTE — Inpatient Diabetes Management (Signed)
Inpatient Diabetes Program Recommendations  AACE/ADA: New Consensus Statement on Inpatient Glycemic Control (2015)  Target Ranges:  Prepandial:   less than 140 mg/dL      Peak postprandial:   less than 180 mg/dL (1-2 hours)      Critically ill patients:  140 - 180 mg/dL   Lab Results  Component Value Date   GLUCAP 200 (H) 07/28/2022   HGBA1C 13.4 (H) 07/23/2022    Review of Glycemic Control  Latest Reference Range & Units 07/27/22 07:55 07/27/22 11:57 07/27/22 15:30 07/27/22 20:50 07/28/22 09:00  Glucose-Capillary 70 - 99 mg/dL 190 (H) 235 (H) 288 (H) 173 (H) 200 (H)  (H): Data is abnormally high  Diabetes history: type 2 Outpatient Diabetes medications: Tresiba 20 units BID, Farxiga 5 mg daily Current orders for Inpatient glycemic control: Semglee 14 units daily, Novolog 0-15 units TID correction scale, Novolog 0-5 units HS scale, Farxiga 5 mg daily    Inpatient Diabetes Program Recommendations:    Novolog 3 units TID with meals if consumes at least 50%.  Needs Glucometer at DC; Order # [id number]  Will continue to follow while inpatient.  Thank you, Reche Dixon, MSN, [place] Diabetes Coordinator Inpatient Diabetes Program [phone] (team pager from 8a-5p)

## 2022-07-28 NOTE — Progress Notes (Signed)
Pt got Prevena tubing caught underneath wheelchair while en route to be discharged. Got pt back in room and redressed VAC. Gave pt extra drape for vac. Pt discharged in stable condition.

## 2022-07-29 ENCOUNTER — Telehealth: Payer: Self-pay | Admitting: Orthopedic Surgery

## 2022-07-29 ENCOUNTER — Other Ambulatory Visit: Payer: Self-pay | Admitting: Orthopedic Surgery

## 2022-07-29 ENCOUNTER — Ambulatory Visit: Payer: BC Managed Care – PPO | Admitting: Cardiology

## 2022-07-29 ENCOUNTER — Encounter (HOSPITAL_COMMUNITY): Payer: Self-pay | Admitting: Orthopedic Surgery

## 2022-07-29 ENCOUNTER — Telehealth: Payer: Self-pay | Admitting: Family Medicine

## 2022-07-29 ENCOUNTER — Telehealth: Payer: Self-pay

## 2022-07-29 DIAGNOSIS — L089 Local infection of the skin and subcutaneous tissue, unspecified: Secondary | ICD-10-CM

## 2022-07-29 DIAGNOSIS — G629 Polyneuropathy, unspecified: Secondary | ICD-10-CM

## 2022-07-29 MED ORDER — AMOXICILLIN-POT CLAVULANATE 875-125 MG PO TABS: 1.0000 | ORAL_TABLET | Freq: Two times a day (BID) | ORAL | 0 refills | Status: DC

## 2022-07-29 MED ORDER — LINEZOLID 600 MG PO TABS: 600.0000 mg | ORAL_TABLET | Freq: Two times a day (BID) | ORAL | 0 refills | Status: DC

## 2022-07-29 MED ORDER — CYANOCOBALAMIN 500 MCG PO TABS: 500.0000 ug | ORAL_TABLET | Freq: Every day | ORAL | 1 refills | Status: DC

## 2022-07-29 NOTE — Telephone Encounter (Signed)
Transition Care Management Unsuccessful Follow-up Telephone Call  Date of discharge and from where:  [facility]  Attempts:  1st Attempt  Reason for unsuccessful TCM follow-up call:  Left voice message

## 2022-07-29 NOTE — Telephone Encounter (Signed)
Caller Name: Mutasim Tuckey Call back phone #: [phone]  Reason for Call: Pt recently discharged from hospital yesterday (9/18) and was given a meds that did not get sent out. Linezolid 300mg  3x a day for 10 days and Vitamin B12 500mg . He was told that he should've started these meds yesterday

## 2022-07-29 NOTE — Telephone Encounter (Signed)
S/p AMPUTATION LEFT FOOT THROUGH MIDDLE OF FOOT on 07/25/22

## 2022-07-29 NOTE — Telephone Encounter (Signed)
Pts wife returned my call. I got pt scheduled for next week for a post op follow up. Wife informed me that there is 3 medications pt did not receive from pharmacy. *Clindamycin *Cyanocobalamin *Dapagliflozin

## 2022-07-29 NOTE — Telephone Encounter (Signed)
Pt and wife informed

## 2022-07-29 NOTE — Telephone Encounter (Signed)
PA submitted via CMM (Key: [id number])  As long as you remain covered by the [facility] and there are no changes to your plan benefits, this request is approved for the following time period: 07/29/2022 - 07/29/2023

## 2022-07-29 NOTE — Telephone Encounter (Signed)
Rx for Linezolid 600 mg was sent be failed transmission to pharmacy by hospital doctor.  Didn't see the B-12 medication being sent.   Please review and advise.  Thanks. Dm/cma

## 2022-07-30 ENCOUNTER — Telehealth: Payer: Self-pay | Admitting: Family Medicine

## 2022-07-30 LAB — AEROBIC/ANAEROBIC CULTURE W GRAM STAIN (SURGICAL/DEEP WOUND)

## 2022-07-30 NOTE — Telephone Encounter (Signed)
Referral routed to [facility], [place], [place] [address] Monday-Tuesday:

## 2022-07-30 NOTE — Telephone Encounter (Signed)
[facility] is calling stating they can not schedule an appointment with the patient because he was dismissed from all [facility] in 2005 from an unpaid balance. The referral was placed on 07/24/22 by Dr. Gena Fray.

## 2022-07-30 NOTE — Telephone Encounter (Signed)
Spoke tp pharmacy and they have both Rx's ready but only have half of the antibiotic right now.   Left VM on patient's wife number with that information.  Dm/cma

## 2022-07-30 NOTE — Telephone Encounter (Signed)
Transition Care Management Unsuccessful Follow-up Telephone Call  Date of discharge and from where:  07/28/2022 [facility]  Attempts:  2nd Attempt  Reason for unsuccessful TCM follow-up call:  No answer/busy

## 2022-07-31 ENCOUNTER — Encounter: Payer: Self-pay | Admitting: Family Medicine

## 2022-08-01 ENCOUNTER — Telehealth: Payer: Self-pay | Admitting: Orthopedic Surgery

## 2022-08-01 ENCOUNTER — Other Ambulatory Visit: Payer: Self-pay

## 2022-08-01 MED ORDER — DOXYCYCLINE HYCLATE 100 MG PO CAPS: 100.0000 mg | ORAL_CAPSULE | Freq: Two times a day (BID) | ORAL | 0 refills | Status: DC

## 2022-08-01 NOTE — Telephone Encounter (Signed)
I called and sw Dr. Sharol Given and he reviewed lab cultures/ sensitives. Ok to change to doxy bid and this was sent pt pham on file. Called and advised of the abx change and also that pt can rent a knee scooter from [facility] they do week to week and month to month rentals or he can purchase off of [employer] whichever would be best for them. To call with any questions. Has follow up in the office on Wednesday.

## 2022-08-01 NOTE — Telephone Encounter (Signed)
Patient's wife called. He can not take augmentin. Causing chest pain. Also he would like a scooter for mobility. Her call back number is [phone]

## 2022-08-04 ENCOUNTER — Other Ambulatory Visit: Payer: Self-pay

## 2022-08-04 ENCOUNTER — Ambulatory Visit (INDEPENDENT_AMBULATORY_CARE_PROVIDER_SITE_OTHER): Payer: BC Managed Care – PPO | Admitting: Internal Medicine

## 2022-08-04 ENCOUNTER — Encounter: Payer: Self-pay | Admitting: Internal Medicine

## 2022-08-04 VITALS — BP 127/78 | HR 72 | Temp 98.5°F

## 2022-08-04 DIAGNOSIS — M869 Osteomyelitis, unspecified: Secondary | ICD-10-CM | POA: Diagnosis not present

## 2022-08-04 DIAGNOSIS — E1169 Type 2 diabetes mellitus with other specified complication: Secondary | ICD-10-CM

## 2022-08-04 MED ORDER — LINEZOLID 600 MG PO TABS: 600.0000 mg | ORAL_TABLET | Freq: Two times a day (BID) | ORAL | 0 refills | Status: DC

## 2022-08-04 NOTE — Telephone Encounter (Signed)
Transition Care Management Unsuccessful Follow-up Telephone Call  Date of discharge and from where:  07/28/2022  [facility]   Attempts:  3rd Attempt  Reason for unsuccessful TCM follow-up call:  No answer/busy

## 2022-08-04 NOTE — Telephone Encounter (Signed)
Printed out FMLA paperwork and placed on desk to be filled out.  Dm/cma

## 2022-08-04 NOTE — Progress Notes (Addendum)
Patient: Aaron Franco  DOB: 04-22-1971 MRN: [id number] PCP: Haydee Salter, MD  Referring Provider: Hospital Follow-up as New patient  Chief Complaint  Patient presents with   New Patient (Initial Visit)    Diabetic foot infection      Patient Active Problem List   Diagnosis Date Noted   Cellulitis and abscess of foot 07/24/2022   Sepsis ([place]) 07/24/2022   Normocytic anemia 07/24/2022   Diabetic foot infection ([place]) 07/23/2022   Chronic venous insufficiency of lower extremity 01/23/2021   Type 2 diabetes mellitus with hyperglycemia, with long-term current use of insulin ([place]) 12/07/2020   Allergic rhinitis due to pollen 06/29/2020   Diabetic neuropathy ([place]) 03/13/2020   Erectile dysfunction due to arterial insufficiency 03/13/2020   History of gout 11/28/2019   Coronary artery disease involving native heart without angina pectoris 03/29/2019   Onychomycosis 12/01/2017   Obesity (BMI 30-39.9) 02/18/2016   Chest pain with moderate risk of acute coronary syndrome 09/17/2015   Type 2 diabetes mellitus with diabetic neuropathy, unspecified ([place]) 04/23/2015   Thrush 04/23/2015   Type II diabetes mellitus with renal manifestations ([place]) 02/16/2014   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   Hammer toe, acquired 02/16/2014   CAD S/P percutaneous coronary angioplasty -  01/24/2014   Cardiac murmur 02/21/2011   Bilateral lower extremity edema 12/24/2010   Vitamin B12 deficiency 07/19/2010   Anemia due to vitamin B12 deficiency 10/31/2009   Trigger finger 10/16/2009   Dyslipidemia 03/01/2007   Gout 03/01/2007   Essential hypertension 03/01/2007   Gastroesophageal reflux disease 03/01/2007     Subjective:  Aaron Franco is a 51 y.o. male with hypertension, hyperlipidemia, CAD status post PCI, heart failure, poorly controlled diabetes, GERD, obesity presented for left foot infection.  On arrival to the ED he had a temp of 101.3, WBC 14 K.  MRI of left foot showed 5th  metatarsal head, proximal phalanx bone marrow edema with small joint effusion, 4th metatarsal head bone marrow edema. Intramuscular signal plantar muscle c/w myositis, abscess 1.5x3.4x3.3cm about dorsal 4, 5th metrasal heads.  He was started on broad-spectrum antibiotics. Taken to OR with Dr. Sharol Given on 9/15 for left TMA, abscess necrotic tissue Cx+ staph lugdunensis, E faecalis, corynebacterium sp(contamination). Pt had rash to augmentin and dishcarged on linezolid and infectious disease follow-up.  Patient notes that he only took linezolid for 2 days as he picked it up later.  Then called orthopedics office due to confusion over Augmentin(pt was taking linezolid at the time).  He was switched to doxycycline, he has taken about 2 days of Doxy. Today 08/04/22: He denies fevers and chills.  No nausea or vomiting.  Reports he was not on antibiotics prior to hospitalization.  Reports he was working on glycemic control. Review of Systems  All other systems reviewed and are negative.   Past Medical History:  Diagnosis Date   CAD (coronary artery disease)    a. 05/2013 NSTEMI/PCI: mCFX 100%-> 3.0 mm  x 20 mm Promus premier DES, EF 65%;  01/2014 Cath: LM nl, LAD 20-30d, LCX patent distal stent, RCA 30-78m, EF 55-65%.   Diabetes mellitus    Essential hypertension    GERD (gastroesophageal reflux disease)    Gout    History of echocardiogram    a. 03/2011 Echo: EF 65%, mild LVH, no rwma, mildly dil LA.   Hyperlipidemia    MI, old    Microalbuminuria due to type 2 diabetes mellitus ([place]) 12/14/2017   Obesity  Outpatient Medications Prior to Visit  Medication Sig Dispense Refill   acetaminophen (TYLENOL) 325 MG tablet Take 2 tablets (650 mg total) by mouth every 4 (four) hours as needed for headache or mild pain.     albuterol (PROVENTIL) (2.5 MG/3ML) 0.083% nebulizer solution Take 3 mLs (2.5 mg total) by nebulization every 6 (six) hours as needed for wheezing or shortness of breath. 75 mL 12    allopurinol (ZYLOPRIM) 100 MG tablet TAKE 1 TABLET(100 MG) BY MOUTH DAILY (Patient taking differently: Take 100 mg by mouth daily as needed (as directed- for gout).) 90 tablet 0   alum & mag hydroxide-simeth (MAALOX/MYLANTA) 200-200-20 MG/5ML suspension Take 15-30 mLs by mouth every 2 (two) hours as needed for indigestion. 355 mL 0   amLODipine (NORVASC) 10 MG tablet Take 1 tablet (10 mg total) by mouth daily. 90 tablet 3   amoxicillin-clavulanate (AUGMENTIN) 875-125 MG tablet Take 1 tablet by mouth 2 (two) times daily. 20 tablet 0   aspirin 81 MG chewable tablet Chew 1 tablet (81 mg total) by mouth daily. (Patient taking differently: Chew 81 mg by mouth daily at 12 noon.)     bisacodyl (DULCOLAX) 5 MG EC tablet Take 1 tablet (5 mg total) by mouth daily as needed for moderate constipation. 30 tablet 0   cyanocobalamin (VITAMIN B12) 500 MCG tablet Take 1 tablet (500 mcg total) by mouth daily. 90 tablet 1   dapagliflozin propanediol (FARXIGA) 5 MG TABS tablet TAKE 1 TABLET(5 MG) BY MOUTH DAILY BREAKFAST (Patient taking differently: Take 5 mg by mouth daily at 12 noon.) 90 tablet 3   dexlansoprazole (DEXILANT) 60 MG capsule TAKE 1 CAPSULE(60 MG) BY MOUTH DAILY (Patient taking differently: Take 60 mg by mouth daily.) 90 capsule 2   docusate sodium (COLACE) 100 MG capsule Take 1 capsule (100 mg total) by mouth daily. 10 capsule 0   doxycycline (VIBRAMYCIN) 100 MG capsule Take 1 capsule (100 mg total) by mouth 2 (two) times daily. 60 capsule 0   Evolocumab (REPATHA SURECLICK) 283 MG/ML SOAJ Inject 140 mLs into the skin every 14 (fourteen) days. 6 mL 3   fluticasone (FLONASE) 50 MCG/ACT nasal spray Place 1 spray into both nostrils daily as needed for allergies or rhinitis. 18.2 mL 6   Glycerin-Polysorbate 80 (REFRESH DRY EYE THERAPY OP) Place 1 drop into both eyes 3 (three) times daily as needed (for dryness).     insulin degludec (TRESIBA FLEXTOUCH) 100 UNIT/ML FlexTouch Pen Inject 15 Units into the skin 2  (two) times daily. 45 mL 0   Insulin Pen Needle 31G X 8 MM MISC 1 Device by Does not apply route in the morning, at noon, in the evening, and at bedtime. 400 each 2   linezolid (ZYVOX) 600 MG tablet Take 1 tablet (600 mg total) by mouth 2 (two) times daily for 21 days. 42 tablet 0   nebivolol (BYSTOLIC) 5 MG tablet TAKE 1 TABLET(5 MG) BY MOUTH DAILY (Patient taking differently: Take 5 mg by mouth at bedtime.) 90 tablet 0   nitroGLYCERIN (NITROSTAT) 0.4 MG SL tablet Place 1 tablet (0.4 mg total) under the tongue every 5 (five) minutes x 3 doses as needed for chest pain. 25 tablet 2   omeprazole (PRILOSEC) 40 MG capsule TAKE 1 CAPSULE(40 MG) BY MOUTH DAILY 90 capsule 1   oxyCODONE (OXY IR/ROXICODONE) 5 MG immediate release tablet Take 1-2 tablets (5-10 mg total) by mouth every 4 (four) hours as needed for moderate pain (pain score 4-6). 30 tablet  0   polyethylene glycol (MIRALAX / GLYCOLAX) 17 g packet Take 17 g by mouth 2 (two) times daily. 14 each 0   rosuvastatin (CRESTOR) 40 MG tablet TAKE 1 TABLET(40 MG) BY MOUTH AT BEDTIME (Patient taking differently: Take 40 mg by mouth at bedtime.) 90 tablet 3   No facility-administered medications prior to visit.     Allergies  Allergen Reactions   Influenza Vaccines Anaphylaxis    Reaction June 2017   Influenza Virus Vaccine H5n1 Anaphylaxis   Fenofibrate Other (See Comments)    Hot flashes   Metformin Other (See Comments)    Chest discomfort   Prednisone Other (See Comments)    Mood swings   Augmentin [Amoxicillin-Pot Clavulanate] Rash    07/2022 tolerated cefazolin   Latex Rash    Social History   Tobacco Use   Smoking status: Former    Types: Cigarettes    Quit date: 11/10/1998    Years since quitting: 23.7   Smokeless tobacco: Never   Tobacco comments:    pt only smoked 1-2 cigs a day, not everyday on occs x 2 years  Vaping Use   Vaping Use: Never used  Substance Use Topics   Alcohol use: Yes    Comment: rare   Drug use: No     Family History  Problem Relation Age of Onset   Heart attack Father        deceased   Diabetes Father    Congestive Heart Failure Father    Stroke Mother    Pancreatic cancer Maternal Aunt    Colon cancer Neg Hx     Objective:  There were no vitals filed for this visit. There is no height or weight on file to calculate BMI.  Physical Exam Constitutional:      General: He is not in acute distress.    Appearance: He is normal weight. He is not toxic-appearing.  HENT:     Head: Normocephalic and atraumatic.     Right Ear: External ear normal.     Left Ear: External ear normal.     Nose: No congestion or rhinorrhea.     Mouth/Throat:     Mouth: Mucous membranes are moist.     Pharynx: Oropharynx is clear.  Eyes:     Extraocular Movements: Extraocular movements intact.     Conjunctiva/sclera: Conjunctivae normal.     Pupils: Pupils are equal, round, and reactive to light.  Cardiovascular:     Rate and Rhythm: Normal rate and regular rhythm.     Heart sounds: No murmur heard.    No friction rub. No gallop.  Pulmonary:     Effort: Pulmonary effort is normal.     Breath sounds: Normal breath sounds.  Abdominal:     General: Abdomen is flat. Bowel sounds are normal.     Palpations: Abdomen is soft.  Musculoskeletal:        General: No swelling. Normal range of motion.     Cervical back: Normal range of motion and neck supple.     Comments: Left foot wound vac  Skin:    General: Skin is warm and dry.  Neurological:     General: No focal deficit present.     Mental Status: He is oriented to person, place, and time.  Psychiatric:        Mood and Affect: Mood normal.     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 Intramuscular signal plantar muscle c/w myositis, abscess 1.5x3.4x3.3cm about dorsal 4, 5th metrasal heads OR with Dr. Sharol Given on 9/15 for left TMA, abscess necrotic tissue Cx+ stpah lugdunedsis and E faecalis, corynebacterium sp(contamination). Pt had rash to augmentin and dishcarged on linezolid.  -Pt took about 2 days of linezolid -On 9/22 linezolid switched to doxy by Ortho(concern pt was on augmentin) Plan: - Stop doxy as it does not cover  E faecalis - Start linzolid to complete one week of antibiotics for skin soft tissue infection. Discussed linezolid is not an option for antibiotics>2 weeks due to side effect profile. Also, Cx are from abscess so clinical significance is unclear.  -Labs today -Follow-up with ortho on 9/27 -Follow-up with ID Monday   #Poorly controlled DM (A1C 13.4) -Discussed he needs glycemic control for optimal wound healing.  Laurice Record, MD [place] [facility]  I have personally spent 65 minutes involved in face-to-face and non-face-to-face activities for this patient on the day of the visit. Professional time spent includes the following activities: Preparing to see the patient (review of tests), Obtaining and/or reviewing separately obtained history (admission/discharge record), Performing a medically appropriate examination and/or evaluation , Ordering medications/tests/procedures, referring and communicating with other health care professionals,  Documenting clinical information in the EMR, Independently interpreting results (not separately reported), Communicating results to the patient/family/caregiver, Counseling and educating the patient/family/caregiver and Care coordination (not separately reported).   08/04/22  10:26 AM

## 2022-08-05 ENCOUNTER — Encounter (HOSPITAL_BASED_OUTPATIENT_CLINIC_OR_DEPARTMENT_OTHER): Payer: Self-pay | Admitting: Internal Medicine

## 2022-08-05 ENCOUNTER — Telehealth: Payer: Self-pay | Admitting: Family Medicine

## 2022-08-05 LAB — CBC WITH DIFFERENTIAL/PLATELET
Absolute Monocytes: 667 cells/uL (ref 200–950)
Basophils Absolute: 47 cells/uL (ref 0–200)
Basophils Relative: 0.5 %
Eosinophils Absolute: 94 cells/uL (ref 15–500)
Eosinophils Relative: 1 %
HCT: 39.1 % (ref 38.5–50.0)
Hemoglobin: 12.6 g/dL — ABNORMAL LOW (ref 13.2–17.1)
Lymphs Abs: 1607 cells/uL (ref 850–3900)
MCH: 25.8 pg — ABNORMAL LOW (ref 27.0–33.0)
MCHC: 32.2 g/dL (ref 32.0–36.0)
MCV: 80 fL (ref 80.0–100.0)
MPV: 8.6 fL (ref 7.5–12.5)
Monocytes Relative: 7.1 %
Neutro Abs: 6984 cells/uL (ref 1500–7800)
Neutrophils Relative %: 74.3 %
Platelets: 579 10*3/uL — ABNORMAL HIGH (ref 140–400)
RBC: 4.89 10*6/uL (ref 4.20–5.80)
RDW: 14.3 % (ref 11.0–15.0)
Total Lymphocyte: 17.1 %
WBC: 9.4 10*3/uL (ref 3.8–10.8)

## 2022-08-05 LAB — SEDIMENTATION RATE: Sed Rate: 108 mm/h — ABNORMAL HIGH (ref 0–20)

## 2022-08-05 LAB — C-REACTIVE PROTEIN: CRP: 26.2 mg/L — ABNORMAL HIGH (ref <8.0)

## 2022-08-05 NOTE — Telephone Encounter (Signed)
Aaron Franco is calling from [facility] needing verbal orders for pt for diabetic care 2x/2w and 1x/3w. Her voice mail is secure @ [phone].

## 2022-08-05 NOTE — Telephone Encounter (Signed)
Left VM with okay to home health for diabetic needs.  Dm/cma

## 2022-08-06 ENCOUNTER — Ambulatory Visit (INDEPENDENT_AMBULATORY_CARE_PROVIDER_SITE_OTHER): Payer: BC Managed Care – PPO | Admitting: Family

## 2022-08-06 DIAGNOSIS — Z89432 Acquired absence of left foot: Secondary | ICD-10-CM

## 2022-08-06 DIAGNOSIS — Z89439 Acquired absence of unspecified foot: Secondary | ICD-10-CM

## 2022-08-11 ENCOUNTER — Encounter: Payer: Self-pay | Admitting: Internal Medicine

## 2022-08-11 ENCOUNTER — Other Ambulatory Visit: Payer: Self-pay

## 2022-08-11 ENCOUNTER — Ambulatory Visit (INDEPENDENT_AMBULATORY_CARE_PROVIDER_SITE_OTHER): Payer: BC Managed Care – PPO | Admitting: Internal Medicine

## 2022-08-11 ENCOUNTER — Telehealth: Payer: Self-pay | Admitting: Orthopedic Surgery

## 2022-08-11 VITALS — BP 111/71 | HR 77 | Temp 98.1°F | Wt 276.0 lb

## 2022-08-11 DIAGNOSIS — L0390 Cellulitis, unspecified: Secondary | ICD-10-CM | POA: Diagnosis not present

## 2022-08-11 NOTE — Progress Notes (Signed)
Patient Active Problem List   Diagnosis Date Noted   Cellulitis and abscess of foot 07/24/2022   Sepsis ([place]) 07/24/2022   Normocytic anemia 07/24/2022   Diabetic foot infection ([place]) 07/23/2022   Chronic venous insufficiency of lower extremity 01/23/2021   Type 2 diabetes mellitus with hyperglycemia, with long-term current use of insulin ([place]) 12/07/2020   Allergic rhinitis due to pollen 06/29/2020   Diabetic neuropathy ([place]) 03/13/2020   Erectile dysfunction due to arterial insufficiency 03/13/2020   History of gout 11/28/2019   Coronary artery disease involving native heart without angina pectoris 03/29/2019   Onychomycosis 12/01/2017   Obesity (BMI 30-39.9) 02/18/2016   Chest pain with moderate risk of acute coronary syndrome 09/17/2015   Type 2 diabetes mellitus with diabetic neuropathy, unspecified ([place]) 04/23/2015   Thrush 04/23/2015   Type II diabetes mellitus with renal manifestations ([place]) 02/16/2014   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   Hammer toe, acquired 02/16/2014   CAD S/P percutaneous coronary angioplasty -  01/24/2014   Cardiac murmur 02/21/2011   Bilateral lower extremity edema 12/24/2010   Vitamin B12 deficiency 07/19/2010   Anemia due to vitamin B12 deficiency 10/31/2009   Trigger finger 10/16/2009   Dyslipidemia 03/01/2007   Gout 03/01/2007   Essential hypertension 03/01/2007   Gastroesophageal reflux disease 03/01/2007    Patient's Medications  New Prescriptions   No medications on file  Previous Medications   ACETAMINOPHEN (TYLENOL) 325 MG TABLET    Take 2 tablets (650 mg total) by mouth every 4 (four) hours as needed for headache or mild pain.   ALBUTEROL (PROVENTIL) (2.5 MG/3ML) 0.083% NEBULIZER SOLUTION    Take 3 mLs (2.5 mg total) by nebulization every 6 (six) hours as needed for wheezing or shortness of breath.   ALLOPURINOL (ZYLOPRIM) 100 MG TABLET    TAKE 1 TABLET(100 MG) BY MOUTH DAILY   ALUM & MAG HYDROXIDE-SIMETH (MAALOX/MYLANTA)  200-200-20 MG/5ML SUSPENSION    Take 15-30 mLs by mouth every 2 (two) hours as needed for indigestion.   AMLODIPINE (NORVASC) 10 MG TABLET    Take 1 tablet (10 mg total) by mouth daily.   ASPIRIN 81 MG CHEWABLE TABLET    Chew 1 tablet (81 mg total) by mouth daily.   BISACODYL (DULCOLAX) 5 MG EC TABLET    Take 1 tablet (5 mg total) by mouth daily as needed for moderate constipation.   CYANOCOBALAMIN (VITAMIN B12) 500 MCG TABLET    Take 1 tablet (500 mcg total) by mouth daily.   DAPAGLIFLOZIN PROPANEDIOL (FARXIGA) 5 MG TABS TABLET    TAKE 1 TABLET(5 MG) BY MOUTH DAILY BREAKFAST   DEXLANSOPRAZOLE (DEXILANT) 60 MG CAPSULE    TAKE 1 CAPSULE(60 MG) BY MOUTH DAILY   DOCUSATE SODIUM (COLACE) 100 MG CAPSULE    Take 1 capsule (100 mg total) by mouth daily.   EVOLOCUMAB (REPATHA SURECLICK) 470 MG/ML SOAJ    Inject 140 mLs into the skin every 14 (fourteen) days.   FLUTICASONE (FLONASE) 50 MCG/ACT NASAL SPRAY    Place 1 spray into both nostrils daily as needed for allergies or rhinitis.   GLYCERIN-POLYSORBATE 80 (REFRESH DRY EYE THERAPY OP)    Place 1 drop into both eyes 3 (three) times daily as needed (for dryness).   INSULIN DEGLUDEC (TRESIBA FLEXTOUCH) 100 UNIT/ML FLEXTOUCH PEN    Inject 15 Units into the skin 2 (two) times daily.   INSULIN PEN NEEDLE 31G X 8 MM MISC    1 Device by Does not  apply route in the morning, at noon, in the evening, and at bedtime.   LINEZOLID (ZYVOX) 600 MG TABLET    Take 1 tablet (600 mg total) by mouth 2 (two) times daily.   NEBIVOLOL (BYSTOLIC) 5 MG TABLET    TAKE 1 TABLET(5 MG) BY MOUTH DAILY   NITROGLYCERIN (NITROSTAT) 0.4 MG SL TABLET    Place 1 tablet (0.4 mg total) under the tongue every 5 (five) minutes x 3 doses as needed for chest pain.   OMEPRAZOLE (PRILOSEC) 40 MG CAPSULE    TAKE 1 CAPSULE(40 MG) BY MOUTH DAILY   OXYCODONE (OXY IR/ROXICODONE) 5 MG IMMEDIATE RELEASE TABLET    Take 1-2 tablets (5-10 mg total) by mouth every 4 (four) hours as needed for moderate pain  (pain score 4-6).   POLYETHYLENE GLYCOL (MIRALAX / GLYCOLAX) 17 G PACKET    Take 17 g by mouth 2 (two) times daily.   ROSUVASTATIN (CRESTOR) 40 MG TABLET    TAKE 1 TABLET(40 MG) BY MOUTH AT BEDTIME  Modified Medications   No medications on file  Discontinued Medications   No medications on file    Subjective:   Aaron Franco is a 51 y.o. male with hypertension, hyperlipidemia, CAD status post PCI, heart failure, poorly controlled diabetes, GERD, obesity presented for left foot infection.  On arrival to the ED he had a temp of 101.3, WBC 14 K.  MRI of left foot showed 5th metatarsal head, proximal phalanx bone marrow edema with small joint effusion, 4th metatarsal head bone marrow edema. Intramuscular signal plantar muscle c/w myositis, abscess 1.5x3.4x3.3cm about dorsal 4, 5th metrasal heads.  He was started on broad-spectrum antibiotics. Taken to OR with Dr. Sharol Given on 9/15 for left TMA, abscess necrotic tissue Cx+ staph lugdunensis, E faecalis, corynebacterium sp(contamination). Pt had rash to augmentin and dishcarged on linezolid and infectious disease follow-up.  Patient notes that he only took linezolid for 2 days as he picked it up later.  Then called orthopedics office due to confusion over Augmentin(pt was taking linezolid at the time).  He was switched to doxycycline, he has taken about 2 days of Doxy.  08/04/22: He denies fevers and chills.  No nausea or vomiting.  Reports he was not on antibiotics prior to hospitalization.  Reports he was working on glycemic control. 08/11/22: No new complaints. Denies fever and chills. Wound vac is off.  100% adherent to antibiotics.  Reports blurry vision this AM.  Review of Systems: Review of Systems  All other systems reviewed and are negative.   Past Medical History:  Diagnosis Date   CAD (coronary artery disease)    a. 05/2013 NSTEMI/PCI: mCFX 100%-> 3.0 mm  x 20 mm Promus premier DES, EF 65%;  01/2014 Cath: LM nl, LAD 20-30d, LCX patent distal  stent, RCA 30-75m EF 55-65%.   Diabetes mellitus    Essential hypertension    GERD (gastroesophageal reflux disease)    Gout    History of echocardiogram    a. 03/2011 Echo: EF 65%, mild LVH, no rwma, mildly dil LA.   Hyperlipidemia    MI, old    Microalbuminuria due to type 2 diabetes mellitus ([place] 12/14/2017   Obesity     Social History   Tobacco Use   Smoking status: Former    Types: Cigarettes    Quit date: 11/10/1998    Years since quitting: 23.7   Smokeless tobacco: Never   Tobacco comments:    pt only smoked 1-2 cigs a day, not  everyday on occs x 2 years  Vaping Use   Vaping Use: Never used  Substance Use Topics   Alcohol use: Yes    Comment: rare   Drug use: No    Family History  Problem Relation Age of Onset   Heart attack Father        deceased   Diabetes Father    Congestive Heart Failure Father    Stroke Mother    Pancreatic cancer Maternal Aunt    Colon cancer Neg Hx     Allergies  Allergen Reactions   Influenza Vaccines Anaphylaxis    Reaction June 2017   Influenza Virus Vaccine H5n1 Anaphylaxis   Fenofibrate Other (See Comments)    Hot flashes   Metformin Other (See Comments)    Chest discomfort   Prednisone Other (See Comments)    Mood swings   Augmentin [Amoxicillin-Pot Clavulanate] Rash    07/2022 tolerated cefazolin   Latex Rash    Health Maintenance  Topic Date Due   COVID-19 Vaccine (1) Never done   OPHTHALMOLOGY EXAM  Never done   Hepatitis C Screening  Never done   COLONOSCOPY (Pts 45-9yr Insurance coverage will need to be confirmed)  Never done   Zoster Vaccines- Shingrix (1 of 2) Never done   Diabetic kidney evaluation - Urine ACR  03/15/2021   FOOT EXAM  01/23/2022   INFLUENZA VACCINE  02/08/2023 (Originally 06/10/2022)   TETANUS/TDAP  03/19/2023 (Originally 09/26/2018)   HEMOGLOBIN A1C  01/21/2023   Diabetic kidney evaluation - GFR measurement  07/28/2023   HIV Screening  Completed   HPV VACCINES  Aged Out     Objective:  Vitals:   08/11/22 1510  Weight: 276 lb (125.2 kg)   Body mass index is 37.43 kg/m.  Physical Exam Constitutional:      General: He is not in acute distress.    Appearance: He is normal weight. He is not toxic-appearing.  HENT:     Head: Normocephalic and atraumatic.     Right Ear: External ear normal.     Left Ear: External ear normal.     Nose: No congestion or rhinorrhea.     Mouth/Throat:     Mouth: Mucous membranes are moist.     Pharynx: Oropharynx is clear.  Eyes:     Extraocular Movements: Extraocular movements intact.     Conjunctiva/sclera: Conjunctivae normal.     Pupils: Pupils are equal, round, and reactive to light.  Cardiovascular:     Rate and Rhythm: Normal rate and regular rhythm.     Heart sounds: No murmur heard.    No friction rub. No gallop.  Pulmonary:     Effort: Pulmonary effort is normal.     Breath sounds: Normal breath sounds.  Abdominal:     General: Abdomen is flat. Bowel sounds are normal.     Palpations: Abdomen is soft.  Musculoskeletal:        General: No swelling. Normal range of motion.     Cervical back: Normal range of motion and neck supple.  Skin:    General: Skin is warm and dry.  Neurological:     General: No focal deficit present.     Mental Status: He is oriented to person, place, and time.  Psychiatric:        Mood and Affect: Mood normal.      Lab Results Lab Results  Component Value Date   WBC 9.4 08/04/2022   HGB 12.6 (L) 08/04/2022  HCT 39.1 08/04/2022   MCV 80.0 08/04/2022   PLT 579 (H) 08/04/2022    Lab Results  Component Value Date   CREATININE 0.65 07/27/2022   BUN 12 07/27/2022   NA 135 07/27/2022   K 4.3 07/27/2022   CL 103 07/27/2022   CO2 22 07/27/2022    Lab Results  Component Value Date   ALT 14 07/23/2022   AST 14 (L) 07/23/2022   ALKPHOS 91 07/23/2022   BILITOT 0.7 07/23/2022    Lab Results  Component Value Date   CHOL 352 (H) 11/28/2021   HDL 34 (L) 11/28/2021    LDLCALC Comment (A) 11/28/2021   LDLDIRECT 151 (H) 11/28/2021   TRIG 1,016 (HH) 11/28/2021   CHOLHDL 10.4 (H) 11/28/2021   No results found for: "LABRPR", "RPRTITER" No results found for: "HIV1RNAQUANT", "HIV1RNAVL", "CD4TABS"   A/P #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 Intramuscular signal plantar muscle c/w myositis, abscess 1.5x3.4x3.3cm about dorsal 4, 5th metrasal heads OR with Dr. Sharol Given on 9/15 for left TMA  for OM and abscess, abscess necrotic tissue Cx+ stpah lugdunedsis and E faecalis, corynebacterium sp(contamination). Found to have large abscess extended to over midfoot. - Pt had rash to augmentin and dishcarged on linezolid.  -Pt took about 2 days of linezolid then on 9/22 linezolid switched to doxy by Ortho(concern pt was on augmentin) - At ID visit on 9/25 stopped doxy as it does not cover  E faecalis and Rx 1 week of linezolid to cover efaecalis and staph lugeneisis as wound vac was still on and could not assess wound.  Lab 9/25 esr 108(99), crp 26(21), wbc 11k -  We have no bone cx, per OR note tissue was viable . No concern for skin soft tisssue infection on exam today. As such treatment for complicated skin soft tissue infection/abscess complete. Will stop linezolid. Plan: -Labs today -Follow-up with ortho on 9/27 -Follow-up with ID in none moth     #blurry vision-mild -Blurry vision has been seen with linezolid, pt is not sure if that is what he has unable to qualify change in vision which he "thinks may had this morning" -Will stop antibiotics as above   #Poorly controlled DM (A1C 13.4) -Discussed he needs glycemic control for optimal wound healing.   Laurice Record, MD [facility] 08/11/2022, 3:19 PM

## 2022-08-11 NOTE — Telephone Encounter (Signed)
Leave FML papers at the front desk

## 2022-08-12 LAB — COMPLETE METABOLIC PANEL WITH GFR
AG Ratio: 1 (calc) (ref 1.0–2.5)
ALT: 18 U/L (ref 9–46)
AST: 13 U/L (ref 10–35)
Albumin: 3.8 g/dL (ref 3.6–5.1)
Alkaline phosphatase (APISO): 76 U/L (ref 35–144)
BUN: 17 mg/dL (ref 7–25)
CO2: 22 mmol/L (ref 20–32)
Calcium: 9.3 mg/dL (ref 8.6–10.3)
Chloride: 105 mmol/L (ref 98–110)
Creat: 0.73 mg/dL (ref 0.70–1.30)
Globulin: 4 g/dL (calc) — ABNORMAL HIGH (ref 1.9–3.7)
Glucose, Bld: 159 mg/dL — ABNORMAL HIGH (ref 65–99)
Potassium: 4.6 mmol/L (ref 3.5–5.3)
Sodium: 139 mmol/L (ref 135–146)
Total Bilirubin: 0.3 mg/dL (ref 0.2–1.2)
Total Protein: 7.8 g/dL (ref 6.1–8.1)
eGFR: 109 mL/min/{1.73_m2} (ref >=60)

## 2022-08-12 LAB — CBC WITH DIFFERENTIAL/PLATELET
Absolute Monocytes: 466 cells/uL (ref 200–950)
Basophils Absolute: 47 cells/uL (ref 0–200)
Basophils Relative: 0.8 %
Eosinophils Absolute: 47 cells/uL (ref 15–500)
Eosinophils Relative: 0.8 %
HCT: 39.3 % (ref 38.5–50.0)
Hemoglobin: 12.7 g/dL — ABNORMAL LOW (ref 13.2–17.1)
Lymphs Abs: 1286 cells/uL (ref 850–3900)
MCH: 25.7 pg — ABNORMAL LOW (ref 27.0–33.0)
MCHC: 32.3 g/dL (ref 32.0–36.0)
MCV: 79.4 fL — ABNORMAL LOW (ref 80.0–100.0)
MPV: 9 fL (ref 7.5–12.5)
Monocytes Relative: 7.9 %
Neutro Abs: 4053 cells/uL (ref 1500–7800)
Neutrophils Relative %: 68.7 %
Platelets: 400 10*3/uL (ref 140–400)
RBC: 4.95 10*6/uL (ref 4.20–5.80)
RDW: 14.9 % (ref 11.0–15.0)
Total Lymphocyte: 21.8 %
WBC: 5.9 10*3/uL (ref 3.8–10.8)

## 2022-08-12 LAB — C-REACTIVE PROTEIN: CRP: 4.7 mg/L (ref <8.0)

## 2022-08-12 LAB — SEDIMENTATION RATE: Sed Rate: 72 mm/h — ABNORMAL HIGH (ref 0–20)

## 2022-08-12 NOTE — Telephone Encounter (Signed)
Patient spouse came in to drop off paperwork I informed her that there is a $25 fee for dropping off FMLA paperwork but being she did not have proper payment method she stated she will come back tomorrow before 5 pm to pay.

## 2022-08-12 NOTE — Telephone Encounter (Signed)
Called pt to advise that forms are ready for pick up at the front desk. Will call with any questions.

## 2022-08-13 ENCOUNTER — Ambulatory Visit: Payer: BC Managed Care – PPO | Admitting: Family Medicine

## 2022-08-13 ENCOUNTER — Encounter: Payer: Self-pay | Admitting: Family

## 2022-08-13 DIAGNOSIS — E119 Type 2 diabetes mellitus without complications: Secondary | ICD-10-CM

## 2022-08-13 DIAGNOSIS — Z79899 Other long term (current) drug therapy: Secondary | ICD-10-CM

## 2022-08-13 DIAGNOSIS — Z7984 Long term (current) use of oral hypoglycemic drugs: Secondary | ICD-10-CM

## 2022-08-13 DIAGNOSIS — K219 Gastro-esophageal reflux disease without esophagitis: Secondary | ICD-10-CM

## 2022-08-13 DIAGNOSIS — Z792 Long term (current) use of antibiotics: Secondary | ICD-10-CM

## 2022-08-13 DIAGNOSIS — M109 Gout, unspecified: Secondary | ICD-10-CM

## 2022-08-13 DIAGNOSIS — I110 Hypertensive heart disease with heart failure: Secondary | ICD-10-CM | POA: Diagnosis not present

## 2022-08-13 DIAGNOSIS — Z89432 Acquired absence of left foot: Secondary | ICD-10-CM | POA: Diagnosis not present

## 2022-08-13 DIAGNOSIS — D649 Anemia, unspecified: Secondary | ICD-10-CM

## 2022-08-13 DIAGNOSIS — E785 Hyperlipidemia, unspecified: Secondary | ICD-10-CM

## 2022-08-13 DIAGNOSIS — E669 Obesity, unspecified: Secondary | ICD-10-CM

## 2022-08-13 DIAGNOSIS — Z794 Long term (current) use of insulin: Secondary | ICD-10-CM

## 2022-08-13 DIAGNOSIS — Z9181 History of falling: Secondary | ICD-10-CM

## 2022-08-13 DIAGNOSIS — Z7982 Long term (current) use of aspirin: Secondary | ICD-10-CM

## 2022-08-13 DIAGNOSIS — A419 Sepsis, unspecified organism: Secondary | ICD-10-CM | POA: Diagnosis not present

## 2022-08-13 DIAGNOSIS — I5032 Chronic diastolic (congestive) heart failure: Secondary | ICD-10-CM

## 2022-08-13 DIAGNOSIS — Z4781 Encounter for orthopedic aftercare following surgical amputation: Secondary | ICD-10-CM | POA: Diagnosis not present

## 2022-08-13 DIAGNOSIS — I2510 Atherosclerotic heart disease of native coronary artery without angina pectoris: Secondary | ICD-10-CM

## 2022-08-13 DIAGNOSIS — Z6837 Body mass index (BMI) 37.0-37.9, adult: Secondary | ICD-10-CM

## 2022-08-13 DIAGNOSIS — I080 Rheumatic disorders of both mitral and aortic valves: Secondary | ICD-10-CM

## 2022-08-13 NOTE — Progress Notes (Signed)
Post-Op Visit Note   Patient: Aaron Franco           Date of Birth: [birth year]           MRN: [id number] Visit Date: 08/06/2022 PCP: Haydee Salter, MD  Chief Complaint:  Chief Complaint  Patient presents with   Left Foot - Routine Post Op    07/25/2022 left midfoot amputation kerecis graft     HPI:  HPI The patient is a 51 year old gentleman who is seen status post left transmetatarsal amputation.  Wound VAC and surgical dressing removed today. Ortho Exam On examination of the left foot transmetatarsal amputation appears to be healing quite well and sutures are in place there is no gaping drainage no erythema minimal edema  Visit Diagnoses: No diagnosis found.  Plan: Begin daily Dial soap cleansing.  Dry dressings.  No weightbearing follow-up in 2 weeks.  Follow-Up Instructions: Return in about 2 weeks (around 08/20/2022).   Imaging: No results found.  Orders:  No orders of the defined types were placed in this encounter.  No orders of the defined types were placed in this encounter.    PMFS History: Patient Active Problem List   Diagnosis Date Noted   Cellulitis and abscess of foot 07/24/2022   Sepsis ([place]) 07/24/2022   Normocytic anemia 07/24/2022   Diabetic foot infection ([place]) 07/23/2022   Chronic venous insufficiency of lower extremity 01/23/2021   Type 2 diabetes mellitus with hyperglycemia, with long-term current use of insulin ([place]) 12/07/2020   Allergic rhinitis due to pollen 06/29/2020   Diabetic neuropathy ([place]) 03/13/2020   Erectile dysfunction due to arterial insufficiency 03/13/2020   History of gout 11/28/2019   Coronary artery disease involving native heart without angina pectoris 03/29/2019   Onychomycosis 12/01/2017   Obesity (BMI 30-39.9) 02/18/2016   Chest pain with moderate risk of acute coronary syndrome 09/17/2015   Type 2 diabetes mellitus with diabetic neuropathy, unspecified ([place]) 04/23/2015   Thrush 04/23/2015   Type II  diabetes mellitus with renal manifestations ([place]) 02/16/2014   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   Hammer toe, acquired 02/16/2014   CAD S/P percutaneous coronary angioplasty -  01/24/2014   Cardiac murmur 02/21/2011   Bilateral lower extremity edema 12/24/2010   Vitamin B12 deficiency 07/19/2010   Anemia due to vitamin B12 deficiency 10/31/2009   Trigger finger 10/16/2009   Dyslipidemia 03/01/2007   Gout 03/01/2007   Essential hypertension 03/01/2007   Gastroesophageal reflux disease 03/01/2007   Past Medical History:  Diagnosis Date   CAD (coronary artery disease)    a. 05/2013 NSTEMI/PCI: mCFX 100%-> 3.0 mm  x 20 mm Promus premier DES, EF 65%;  01/2014 Cath: LM nl, LAD 20-30d, LCX patent distal stent, RCA 30-40m, EF 55-65%.   Diabetes mellitus    Essential hypertension    GERD (gastroesophageal reflux disease)    Gout    History of echocardiogram    a. 03/2011 Echo: EF 65%, mild LVH, no rwma, mildly dil LA.   Hyperlipidemia    MI, old    Microalbuminuria due to type 2 diabetes mellitus ([place]) 12/14/2017   Obesity     Family History  Problem Relation Age of Onset   Heart attack Father        deceased   Diabetes Father    Congestive Heart Failure Father    Stroke Mother    Pancreatic cancer Maternal Aunt    Colon cancer Neg Hx     Past Surgical History:  Procedure Laterality Date  AMPUTATION Left 07/25/2022   Procedure: AMPUTATION LEFT FOOT THROUGH MIDDLE OF FOOT;  Surgeon: Newt Minion, MD;  Location: [facility];  Service: Orthopedics;  Laterality: Left;   BICEPS TENDON REPAIR  Nov 13 2010   right   HAND SURGERY  1990   left   LEFT HEART CATH N/A 05/12/2013   Procedure: LEFT HEART CATH;  Surgeon: Sinclair Grooms, MD;  Location: [facility] CATH LAB;  Service: Cardiovascular;  Laterality: N/A;   LEFT HEART CATHETERIZATION WITH CORONARY ANGIOGRAM N/A 01/24/2014   Procedure: LEFT HEART CATHETERIZATION WITH CORONARY ANGIOGRAM;  Surgeon: Peter M Martinique, MD;  Location: [facility] CATH LAB;   Service: Cardiovascular;  Laterality: N/A;   PERCUTANEOUS CORONARY STENT INTERVENTION (PCI-S)  05/2013   mCx 3.82mm x 20 mm Promus P DES   PERCUTANEOUS CORONARY STENT INTERVENTION (PCI-S)  05/12/2013   Procedure: PERCUTANEOUS CORONARY STENT INTERVENTION (PCI-S);  Surgeon: Sinclair Grooms, MD;  Location: [facility] CATH LAB;  Service: Cardiovascular;;   Social History   Occupational History   Occupation: floor maintenance    Employer: [employer]   Occupation: Airline pilot: [place]  Tobacco Use   Smoking status: Former    Types: Cigarettes    Quit date: 11/10/1998    Years since quitting: 23.7   Smokeless tobacco: Never   Tobacco comments:    pt only smoked 1-2 cigs a day, not everyday on occs x 2 years  Vaping Use   Vaping Use: Never used  Substance and Sexual Activity   Alcohol use: Yes    Comment: rare   Drug use: No   Sexual activity: Yes

## 2022-08-18 ENCOUNTER — Ambulatory Visit (INDEPENDENT_AMBULATORY_CARE_PROVIDER_SITE_OTHER): Payer: BC Managed Care – PPO | Admitting: Family Medicine

## 2022-08-18 ENCOUNTER — Encounter: Payer: Self-pay | Admitting: Family Medicine

## 2022-08-18 VITALS — BP 116/62 | HR 87 | Temp 97.7°F | Ht 72.0 in | Wt 275.0 lb

## 2022-08-18 DIAGNOSIS — Z89432 Acquired absence of left foot: Secondary | ICD-10-CM | POA: Insufficient documentation

## 2022-08-18 DIAGNOSIS — E1140 Type 2 diabetes mellitus with diabetic neuropathy, unspecified: Secondary | ICD-10-CM | POA: Diagnosis not present

## 2022-08-18 DIAGNOSIS — E1142 Type 2 diabetes mellitus with diabetic polyneuropathy: Secondary | ICD-10-CM

## 2022-08-18 DIAGNOSIS — I10 Essential (primary) hypertension: Secondary | ICD-10-CM | POA: Diagnosis not present

## 2022-08-18 DIAGNOSIS — I2510 Atherosclerotic heart disease of native coronary artery without angina pectoris: Secondary | ICD-10-CM

## 2022-08-18 DIAGNOSIS — Z794 Long term (current) use of insulin: Secondary | ICD-10-CM

## 2022-08-18 MED ORDER — TRESIBA FLEXTOUCH 100 UNIT/ML ~~LOC~~ SOPN: 20.0000 [IU] | PEN_INJECTOR | Freq: Every day | SUBCUTANEOUS | 0 refills | Status: DC

## 2022-08-18 MED ORDER — NITROGLYCERIN 0.4 MG SL SUBL: 0.4000 mg | SUBLINGUAL_TABLET | SUBLINGUAL | 2 refills | Status: DC | PRN

## 2022-08-18 NOTE — Progress Notes (Signed)
[facility] [address] [place] [address] Dept: [phone] Dept Fax: [facility] Follow-Up Visit  Subjective:    Patient ID: Aaron Franco, male    DOB: 02-08-1971, 51 y.o..   MRN: [id number]  Chief Complaint  Patient presents with   Hospitalization [facility] f/u and FMLA papers for wife.     History of Present Illness:  Patient is in today for a hospital follow-up. I saw Mr. Bostwick on 9/13 to follow-up on enlarged lymph nodes in his left groin that were noted on a venous ultrasound he had for evaluation of a swollen left leg. At the time of my exam, I found him to have evidence of a diabetic foot infection. He went from my office to [facility], where he was admitted from 9/13-9/18/2023. He ended up having a transmetatarsal amputation of his left foot. He was treated initially with IV antibiotics. He was discharged on linezolid.  Since discharge, he feels that he is doing well. He notes his foot is healing. He has home health coming two times a week for dressing changes. He also has PT coming twice a week to help him with ambulation and strengthening. He is using a walker currently. He is dependent on his wife to assist him with transportation and multiple of his ADLs/IADLs. He has seen the ID physician twice and is no longer on antibiotics.  Mr. 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 he is currently managed on dapagliflozin [place] Glade) 5 mg daily and insulin degludec. He notes he was discharged on 15 units daily (though the discharge summary indicates twice a day).  Although his blood sugars have been improving since discharge compared to prior to admission, he is still seeing these ranging 140-170. He has not been checking fasting blood sugars. He had seen an endocrinologist in past years, but is no longer established with anyone.  Mr.  Shrout has a history of hypertension. He is managed on amlodipine 10 mg daily.  Mr. Popper has a history of coronary artery disease s/p PTCA. He is managed on a daily 81 mg aspirin, nebivolol 5 mg daily. He also has hyperlipidemia and is managed on rosuvastatin 40 mg daily  and Repatha.  Past Medical History: Patient Active Problem List   Diagnosis Date Noted   S/P transmetatarsal amputation of foot, left ([place]) 08/18/2022   Cellulitis and abscess of foot 07/24/2022   Sepsis ([place]) 07/24/2022   Normocytic anemia 07/24/2022   Diabetic foot infection ([place]) 07/23/2022   Chronic venous insufficiency of lower extremity 01/23/2021   Type 2 diabetes mellitus with hyperglycemia, with long-term current use of insulin ([place]) 12/07/2020   Allergic rhinitis due to pollen 06/29/2020   Diabetic neuropathy ([place]) 03/13/2020   Erectile dysfunction due to arterial insufficiency 03/13/2020   History of gout 11/28/2019   Coronary artery disease involving native heart without angina pectoris 03/29/2019   Onychomycosis 12/01/2017   Obesity (BMI 30-39.9) 02/18/2016   Chest pain with moderate risk of acute coronary syndrome 09/17/2015   Type 2 diabetes mellitus with diabetic neuropathy, unspecified ([place]) 04/23/2015   Thrush 04/23/2015   Type II diabetes mellitus with renal manifestations ([place]) 02/16/2014   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   Hammer toe, acquired 02/16/2014   CAD S/P percutaneous coronary angioplasty -  01/24/2014   Cardiac murmur 02/21/2011   Bilateral lower extremity edema 12/24/2010   Vitamin B12 deficiency 07/19/2010  Anemia due to vitamin B12 deficiency 10/31/2009   Trigger finger 10/16/2009   Dyslipidemia 03/01/2007   Gout 03/01/2007   Essential hypertension 03/01/2007   Gastroesophageal reflux disease 03/01/2007   Past Surgical History:  Procedure Laterality Date   AMPUTATION Left 07/25/2022   Procedure: AMPUTATION LEFT FOOT THROUGH MIDDLE OF FOOT;  Surgeon: Nadara Mustard, MD;  Location: [facility] OR;  Service: Orthopedics;  Laterality: Left;   BICEPS TENDON REPAIR  Nov 13 2010   right   HAND SURGERY  1990   left   LEFT HEART CATH N/A 05/12/2013   Procedure: LEFT HEART CATH;  Surgeon: Lesleigh Noe, MD;  Location: [facility] CATH LAB;  Service: Cardiovascular;  Laterality: N/A;   LEFT HEART CATHETERIZATION WITH CORONARY ANGIOGRAM N/A 01/24/2014   Procedure: LEFT HEART CATHETERIZATION WITH CORONARY ANGIOGRAM;  Surgeon: Peter M Swaziland, MD;  Location: [facility] CATH LAB;  Service: Cardiovascular;  Laterality: N/A;   PERCUTANEOUS CORONARY STENT INTERVENTION (PCI-S)  05/2013   mCx 3.57mm x 20 mm Promus P DES   PERCUTANEOUS CORONARY STENT INTERVENTION (PCI-S)  05/12/2013   Procedure: PERCUTANEOUS CORONARY STENT INTERVENTION (PCI-S);  Surgeon: Lesleigh Noe, MD;  Location: [facility] CATH LAB;  Service: Cardiovascular;;   Family History  Problem Relation Age of Onset   Heart attack Father        deceased   Diabetes Father    Congestive Heart Failure Father    Stroke Mother    Pancreatic cancer Maternal Aunt    Colon cancer Neg Hx    Outpatient Medications Prior to Visit  Medication Sig Dispense Refill   acetaminophen (TYLENOL) 325 MG tablet Take 2 tablets (650 mg total) by mouth every 4 (four) hours as needed for headache or mild pain.     allopurinol (ZYLOPRIM) 100 MG tablet TAKE 1 TABLET(100 MG) BY MOUTH DAILY (Patient taking differently: Take 100 mg by mouth daily as needed (as directed- for gout).) 90 tablet 0   alum & mag hydroxide-simeth (MAALOX/MYLANTA) 200-200-20 MG/5ML suspension Take 15-30 mLs by mouth every 2 (two) hours as needed for indigestion. 355 mL 0   amLODipine (NORVASC) 10 MG tablet Take 1 tablet (10 mg total) by mouth daily. 90 tablet 3   aspirin 81 MG chewable tablet Chew 1 tablet (81 mg total) by mouth daily. (Patient taking differently: Chew 81 mg by mouth daily at 12 noon.)     cyanocobalamin (VITAMIN B12) 500 MCG tablet Take 1 tablet (500 mcg total) by mouth  daily. 90 tablet 1   dapagliflozin propanediol (FARXIGA) 5 MG TABS tablet TAKE 1 TABLET(5 MG) BY MOUTH DAILY BREAKFAST (Patient taking differently: Take 5 mg by mouth daily at 12 noon.) 90 tablet 3   dexlansoprazole (DEXILANT) 60 MG capsule TAKE 1 CAPSULE(60 MG) BY MOUTH DAILY (Patient taking differently: Take 60 mg by mouth daily.) 90 capsule 2   Evolocumab (REPATHA SURECLICK) 140 MG/ML SOAJ Inject 140 mLs into the skin every 14 (fourteen) days. 6 mL 3   fluticasone (FLONASE) 50 MCG/ACT nasal spray Place 1 spray into both nostrils daily as needed for allergies or rhinitis. 18.2 mL 6   Insulin Pen Needle 31G X 8 MM MISC 1 Device by Does not apply route in the morning, at noon, in the evening, and at bedtime. 400 each 2   oxyCODONE (OXY IR/ROXICODONE) 5 MG immediate release tablet Take 1-2 tablets (5-10 mg total) by mouth every 4 (four) hours as needed for moderate pain (pain score 4-6). 30 tablet 0  polyethylene glycol (MIRALAX / GLYCOLAX) 17 g packet Take 17 g by mouth 2 (two) times daily. 14 each 0   rosuvastatin (CRESTOR) 40 MG tablet TAKE 1 TABLET(40 MG) BY MOUTH AT BEDTIME (Patient taking differently: Take 40 mg by mouth at bedtime.) 90 tablet 3   insulin degludec (TRESIBA FLEXTOUCH) 100 UNIT/ML FlexTouch Pen Inject 15 Units into the skin 2 (two) times daily. 45 mL 0   nitroGLYCERIN (NITROSTAT) 0.4 MG SL tablet Place 1 tablet (0.4 mg total) under the tongue every 5 (five) minutes x 3 doses as needed for chest pain. 25 tablet 2   bisacodyl (DULCOLAX) 5 MG EC tablet Take 1 tablet (5 mg total) by mouth daily as needed for moderate constipation. (Patient not taking: Reported on 08/18/2022) 30 tablet 0   docusate sodium (COLACE) 100 MG capsule Take 1 capsule (100 mg total) by mouth daily. (Patient not taking: Reported on 08/18/2022) 10 capsule 0   Glycerin-Polysorbate 80 (REFRESH DRY EYE THERAPY OP) Place 1 drop into both eyes 3 (three) times daily as needed (for dryness).     nebivolol (BYSTOLIC) 5 MG  tablet TAKE 1 TABLET(5 MG) BY MOUTH DAILY (Patient taking differently: Take 5 mg by mouth at bedtime.) 90 tablet 0   albuterol (PROVENTIL) (2.5 MG/3ML) 0.083% nebulizer solution Take 3 mLs (2.5 mg total) by nebulization every 6 (six) hours as needed for wheezing or shortness of breath. (Patient not taking: Reported on 08/18/2022) 75 mL 12   linezolid (ZYVOX) 600 MG tablet Take 1 tablet (600 mg total) by mouth 2 (two) times daily. 14 tablet 0   omeprazole (PRILOSEC) 40 MG capsule TAKE 1 CAPSULE(40 MG) BY MOUTH DAILY 90 capsule 1   No facility-administered medications prior to visit.   Allergies  Allergen Reactions   Influenza Vaccines Anaphylaxis    Reaction June 2017   Influenza Virus Vaccine H5n1 Anaphylaxis   Fenofibrate Other (See Comments)    Hot flashes   Metformin Other (See Comments)    Chest discomfort   Prednisone Other (See Comments)    Mood swings   Augmentin [Amoxicillin-Pot Clavulanate] Rash    07/2022 tolerated cefazolin   Latex Rash     Objective:   Today's Vitals   08/18/22 1530  BP: 116/62  Pulse: 87  Temp: 97.7 F (36.5 C)  TempSrc: Oral  SpO2: 98%  Weight: 275 lb (124.7 kg)  Height: 6' (1.829 m)   Body mass index is 37.3 kg/m.   General: Well developed, well nourished. No acute distress. Extremities: Right foot has extensive scaliness tot he sole and lateral margins of the feet with mild   fissuring present.  Both hands are cool to the touch, though pulses are normal. Psych: Alert and oriented. Normal mood and affect.  Health Maintenance Due  Topic Date Due   OPHTHALMOLOGY EXAM  Never done   Hepatitis C Screening  Never done   COLONOSCOPY (Pts 45-46yrs Insurance coverage will need to be confirmed)  Never done   Zoster Vaccines- Shingrix (1 of 2) Never done   Diabetic kidney evaluation - Urine ACR  03/15/2021   FOOT EXAM  01/23/2022   Lab Results Last hemoglobin A1c Lab Results  Component Value Date   HGBA1C 13.4 (H) 07/23/2022     Assessment  & Plan:   1. S/P transmetatarsal amputation of foot, left [facility]) Mr. Puertas is healing from his surgery well. He will be seen by orthopedics on Wed. He has home health coming for both nursing/wound care and physical  therapy to help him improve his ambulation. I will refer him to podiatry, esp. related to ongoing care for his right foot. He is at risk for peripheral arterial disease. In light of his cool hands and his recent amputation, I will send him to vascular surgery for an assessment.  - Ambulatory referral to Podiatry - Ambulatory referral to Vascular Surgery  2. Type 2 diabetes mellitus with diabetic neuropathy, with long-term current use of insulin (HCC) We discussed a target of getting his fasting glucoses under 130.  He will start checking fasting levels daily. If his average is not < 130 in 1 week, he should increase his Tresiba dose by 5 units daily. He can do this a 2nd time after another week. I will plan to see him back in 3-4 weeks to reassess. In the meantime, I will try and get him in with endocrinology to assist with his diabetes care.  - insulin degludec (TRESIBA FLEXTOUCH) 100 UNIT/ML FlexTouch Pen; Inject 20 Units into the skin daily.  Dispense: 45 mL; Refill: 0 - Ambulatory referral to Endocrinology  3. Diabetic polyneuropathy associated with type 2 diabetes mellitus (HCC) Continue gabapentin 300 mg bid. Follow-up with podiatry to help in prevention of further amputations.  - Ambulatory referral to Podiatry  4. Essential hypertension Blood pressure is at goal today.Continue amlodipine 10 mg daily.  5. Coronary artery disease involving native coronary artery of native heart without angina pectoris Continue nebivolol 5 mg daily and aspirin 81 mg daily. I will renew his NTG.  - nitroGLYCERIN (NITROSTAT) 0.4 MG SL tablet; Place 1 tablet (0.4 mg total) under the tongue every 5 (five) minutes x 3 doses as needed for chest pain.  Dispense: 25 tablet; Refill: 2   Return in  about 4 weeks (around 09/15/2022) for Reassessment.   Loyola Mast, MD

## 2022-08-18 NOTE — Patient Instructions (Signed)
Check fasting blood sugars daily. If fasting glucose is not averaging < 130 after 7 days, increase insulin by 5 units. You may do this twice if needed.

## 2022-08-20 ENCOUNTER — Ambulatory Visit (INDEPENDENT_AMBULATORY_CARE_PROVIDER_SITE_OTHER): Payer: BC Managed Care – PPO | Admitting: Family

## 2022-08-20 DIAGNOSIS — Z89432 Acquired absence of left foot: Secondary | ICD-10-CM

## 2022-08-20 DIAGNOSIS — Z89439 Acquired absence of unspecified foot: Secondary | ICD-10-CM

## 2022-08-23 IMAGING — CR DG CHEST 2V
2 series · 2 of 2 positions shown · non-contrast
Comparison: 12/26/2019

CLINICAL DATA: Chest pain

EXAM:
CHEST - 2 VIEW

[chest pa]
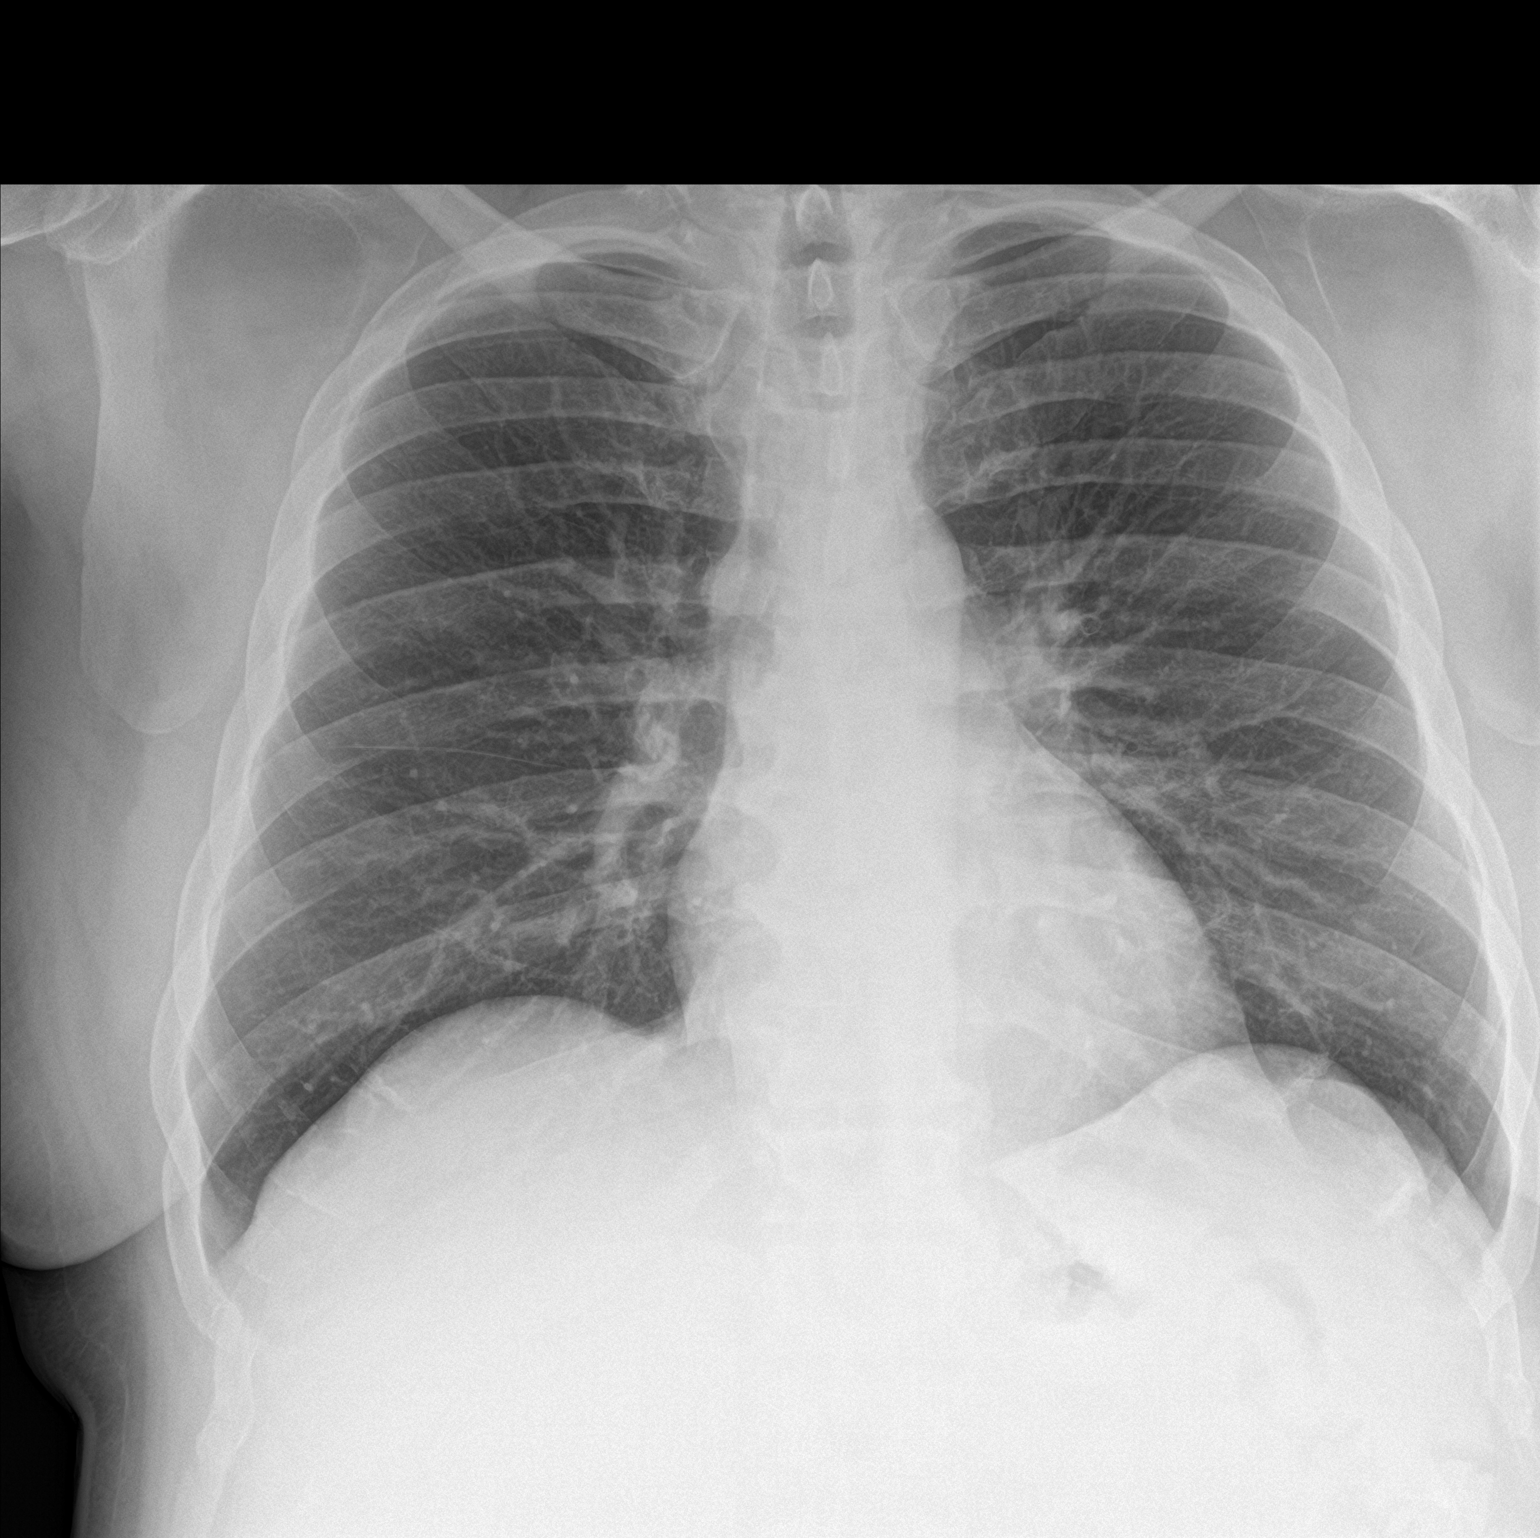

[chest lat]
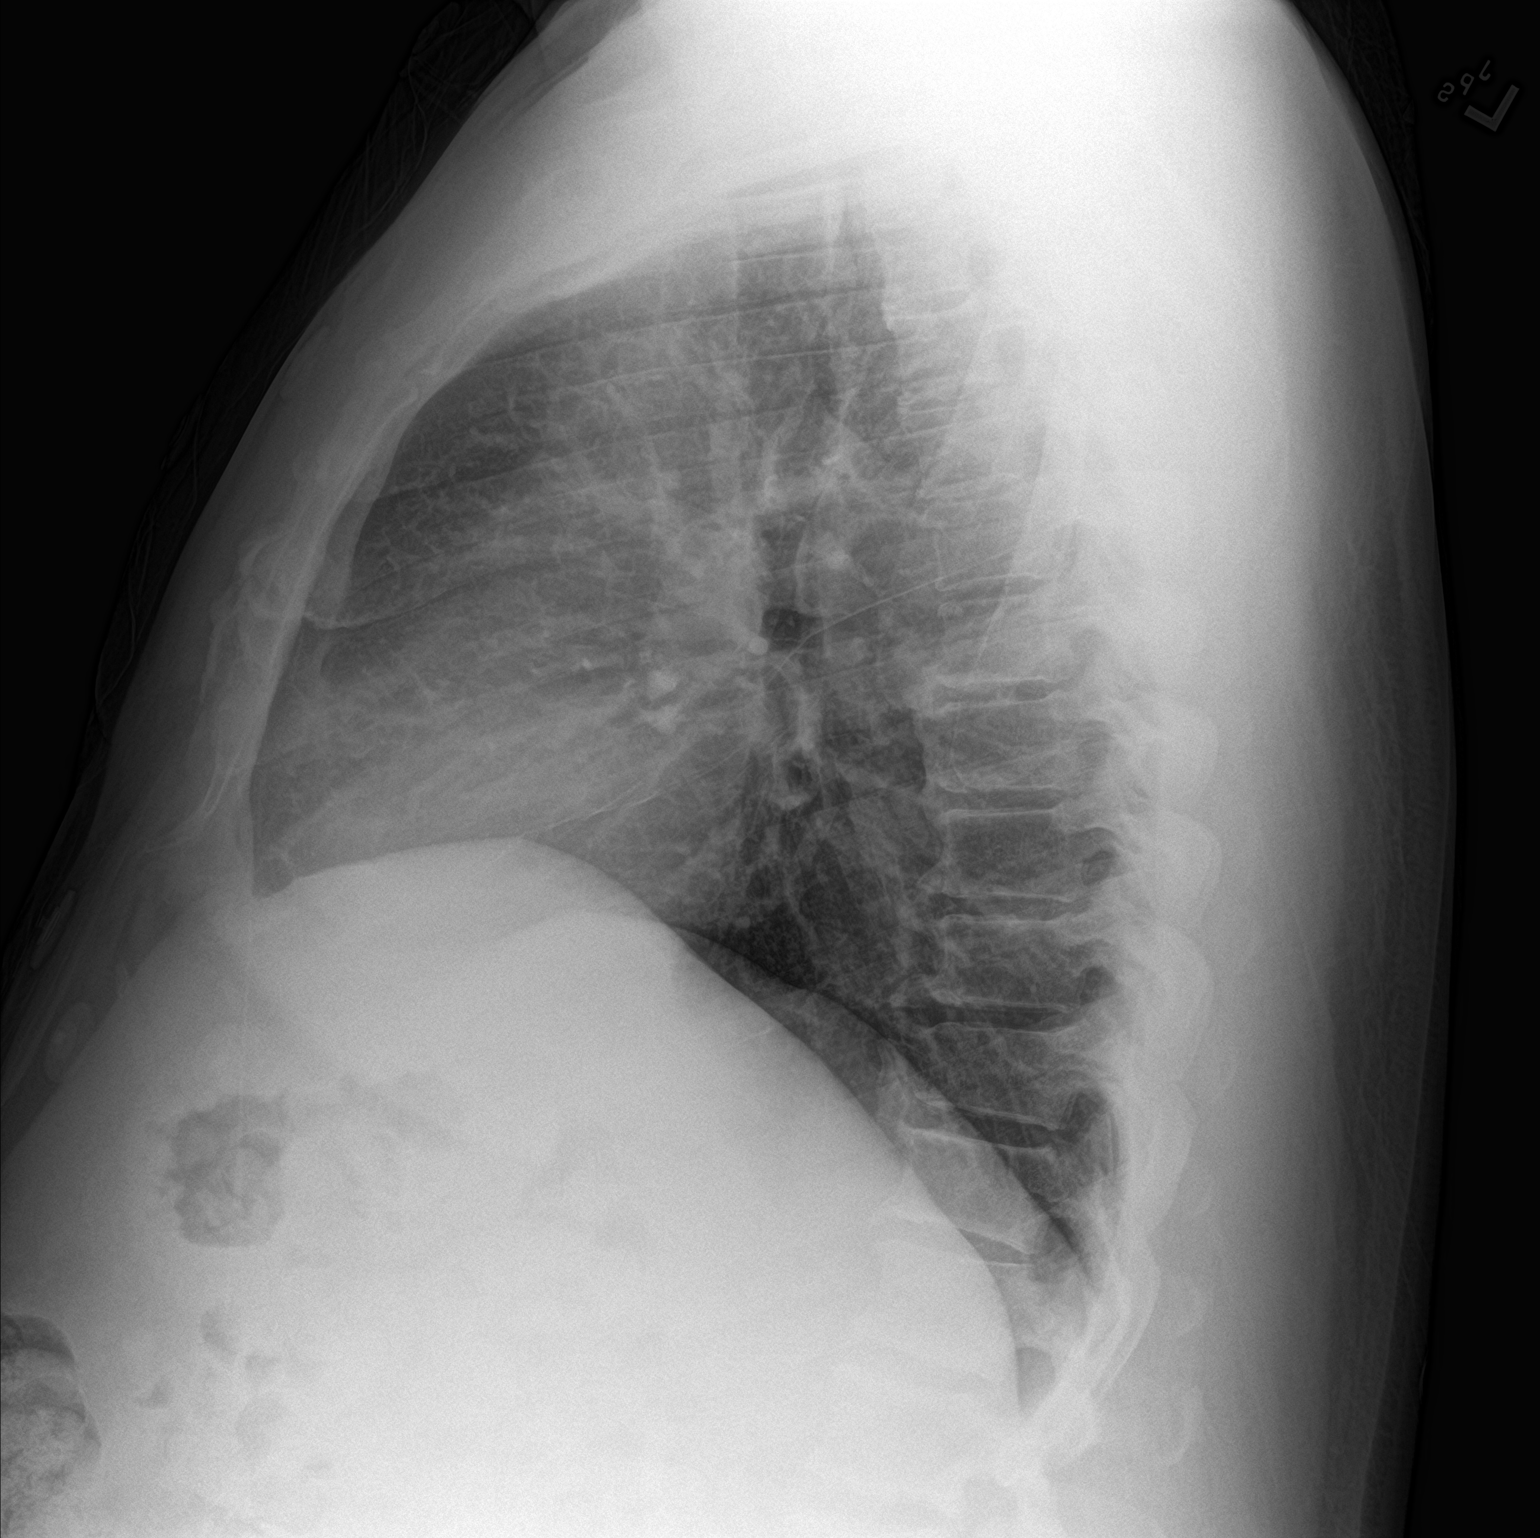

[2 of 2 positions shown; findings below may reference images not displayed]

FINDINGS: The heart size and mediastinal contours are within normal limits.
Both lungs are clear. The visualized skeletal structures are
unremarkable.
IMPRESSION: Negative.

## 2022-08-25 ENCOUNTER — Telehealth: Payer: Self-pay

## 2022-08-25 NOTE — Telephone Encounter (Signed)
Pt called to confirm his appt and inquire the reason for the referral from his PCP.  Reviewed pt's chart, returned call for clarification, no answer, unable to leave vm, vm not setup.  Called pt again, two identifiers used. Reviewed appt date and times. Informed pt the reason for referral. Confirmed understanding. Confirmed appts.

## 2022-08-26 ENCOUNTER — Other Ambulatory Visit: Payer: Self-pay | Admitting: Family Medicine

## 2022-08-26 ENCOUNTER — Other Ambulatory Visit: Payer: Self-pay | Admitting: *Deleted

## 2022-08-26 DIAGNOSIS — R600 Localized edema: Secondary | ICD-10-CM

## 2022-08-28 ENCOUNTER — Telehealth: Payer: Self-pay | Admitting: Family

## 2022-08-28 ENCOUNTER — Ambulatory Visit (INDEPENDENT_AMBULATORY_CARE_PROVIDER_SITE_OTHER): Payer: BC Managed Care – PPO | Admitting: Podiatry

## 2022-08-28 DIAGNOSIS — M79674 Pain in right toe(s): Secondary | ICD-10-CM | POA: Diagnosis not present

## 2022-08-28 DIAGNOSIS — E1140 Type 2 diabetes mellitus with diabetic neuropathy, unspecified: Secondary | ICD-10-CM | POA: Diagnosis not present

## 2022-08-28 DIAGNOSIS — E1149 Type 2 diabetes mellitus with other diabetic neurological complication: Secondary | ICD-10-CM

## 2022-08-28 DIAGNOSIS — Z89432 Acquired absence of left foot: Secondary | ICD-10-CM | POA: Diagnosis not present

## 2022-08-28 DIAGNOSIS — B351 Tinea unguium: Secondary | ICD-10-CM

## 2022-08-28 NOTE — Telephone Encounter (Signed)
Patient wife came in and stated that Junie Panning and Dr Sharol Given stated the her husband would be out of work 9/15/20203 3 months out. Patient needs a note f or work for that amount of time. Pt would like it uploading to his mychart if possible or please call when read [phone]

## 2022-08-29 ENCOUNTER — Encounter: Payer: Self-pay | Admitting: Family Medicine

## 2022-08-29 NOTE — Progress Notes (Signed)
Subjective:   Patient ID: Aaron Franco, male   DOB: 51 y.o.   MRN: [id number]   HPI Patient presents concerned about his right big toe that there is no issue with it as he lost his left forefoot secondary to diabetes and is concerned about the right foot and not losing it.  Patient does work but is not on disability now due to the surgery which was approximately a month ago.  Patient does not currently smoke and is not active   Review of Systems  All other systems reviewed and are negative.       Objective:  Physical Exam Vitals and nursing note reviewed.  Constitutional:      Appearance: He is well-developed.  Pulmonary:     Effort: Pulmonary effort is normal.  Musculoskeletal:        General: Normal range of motion.  Skin:    General: Skin is warm.  Neurological:     Mental Status: He is alert.     Vascular status was found to be intact bilateral there is a healing incision site of the left midfoot from transmetatarsal amputation and the right foot at this time looks healthy with diminishment of sharp dull vibratory noted.  Patient has nail disease 1-5 both feet that are moderately thickened but not in distress with mild discomfort and he is at high risk with this.  Patient did not have any breaks in tissue does have dry skin plantarly     Assessment:  High risk patient who has undergone a transmetatarsal amputation left who has very dry skin in general plantarly that may have contributed to the condition he is experiencing with mycotic nail infection 1-5 right that are painful     Plan:  H&P reviewed all conditions.  At this point I do think the left will heal and he will follow-up with the physician who did the surgery I discussed at great length the importance of daily foot inspections and trying to use a softening type agent on his feet and have any breaks in skin to reappoint immediately.  Debridement of nailbeds 1-5 right foot accomplished all questions answered and  again this patient will have to be very careful in the future and hopefully this will not happen.  Also discussed much better control of his diabetes and he is going to work on this along with weight loss

## 2022-08-29 NOTE — Telephone Encounter (Signed)
Letter written and sent to pt through mychart.

## 2022-09-02 ENCOUNTER — Encounter: Payer: Self-pay | Admitting: Family

## 2022-09-02 NOTE — Progress Notes (Signed)
Post-Op Visit Note   Patient: Aaron Franco           Date of Birth: [birth year]           MRN: [id number] Visit Date: 08/20/2022 PCP: Haydee Salter, MD  Chief Complaint:  Chief Complaint  Patient presents with   Left Leg - Routine Post Op    07/25/2022 left midfoot amputation kerecis graft     HPI:  HPI The patient is a 51 year old gentleman seen status post left transmetatarsal amputation.  He has been doing dry dressing changes continues nonweightbearing Ortho Exam On examination of the left foot his transmetatarsal amputation is well-healed sutures are in place there is no gaping no drainage no erythema Visit Diagnoses: No diagnosis found.  Plan: Sutures harvested today without incident.  He may advance his weightbearing as tolerated.  Given an order for his custom orthotic with spacer Follow-Up Instructions: No follow-ups on file.   Imaging: No results found.  Orders:  No orders of the defined types were placed in this encounter.  No orders of the defined types were placed in this encounter.    PMFS History: Patient Active Problem List   Diagnosis Date Noted   S/P transmetatarsal amputation of foot, left ([place]) 08/18/2022   Cellulitis and abscess of foot 07/24/2022   Sepsis ([place]) 07/24/2022   Normocytic anemia 07/24/2022   Diabetic foot infection ([place]) 07/23/2022   Chronic venous insufficiency of lower extremity 01/23/2021   Type 2 diabetes mellitus with hyperglycemia, with long-term current use of insulin ([place]) 12/07/2020   Allergic rhinitis due to pollen 06/29/2020   Diabetic neuropathy ([place]) 03/13/2020   Erectile dysfunction due to arterial insufficiency 03/13/2020   History of gout 11/28/2019   Coronary artery disease involving native heart without angina pectoris 03/29/2019   Onychomycosis 12/01/2017   Obesity (BMI 30-39.9) 02/18/2016   Chest pain with moderate risk of acute coronary syndrome 09/17/2015   Type 2 diabetes mellitus with diabetic  neuropathy, unspecified ([place]) 04/23/2015   Thrush 04/23/2015   Type II diabetes mellitus with renal manifestations ([place]) 02/16/2014   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   Hammer toe, acquired 02/16/2014   CAD S/P percutaneous coronary angioplasty -  01/24/2014   Cardiac murmur 02/21/2011   Bilateral lower extremity edema 12/24/2010   Vitamin B12 deficiency 07/19/2010   Anemia due to vitamin B12 deficiency 10/31/2009   Trigger finger 10/16/2009   Dyslipidemia 03/01/2007   Gout 03/01/2007   Essential hypertension 03/01/2007   Gastroesophageal reflux disease 03/01/2007   Past Medical History:  Diagnosis Date   CAD (coronary artery disease)    a. 05/2013 NSTEMI/PCI: mCFX 100%-> 3.0 mm  x 20 mm Promus premier DES, EF 65%;  01/2014 Cath: LM nl, LAD 20-30d, LCX patent distal stent, RCA 30-34m, EF 55-65%.   Diabetes mellitus    Essential hypertension    GERD (gastroesophageal reflux disease)    Gout    History of echocardiogram    a. 03/2011 Echo: EF 65%, mild LVH, no rwma, mildly dil LA.   Hyperlipidemia    MI, old    Microalbuminuria due to type 2 diabetes mellitus ([place]) 12/14/2017   Obesity     Family History  Problem Relation Age of Onset   Heart attack Father        deceased   Diabetes Father    Congestive Heart Failure Father    Stroke Mother    Pancreatic cancer Maternal Aunt    Colon cancer Neg Hx  Past Surgical History:  Procedure Laterality Date   AMPUTATION Left 07/25/2022   Procedure: AMPUTATION LEFT FOOT THROUGH MIDDLE OF FOOT;  Surgeon: Nadara Mustard, MD;  Location: [facility] OR;  Service: Orthopedics;  Laterality: Left;   BICEPS TENDON REPAIR  Nov 13 2010   right   HAND SURGERY  1990   left   LEFT HEART CATH N/A 05/12/2013   Procedure: LEFT HEART CATH;  Surgeon: Lesleigh Noe, MD;  Location: [facility] CATH LAB;  Service: Cardiovascular;  Laterality: N/A;   LEFT HEART CATHETERIZATION WITH CORONARY ANGIOGRAM N/A 01/24/2014   Procedure: LEFT HEART CATHETERIZATION WITH  CORONARY ANGIOGRAM;  Surgeon: Peter M Swaziland, MD;  Location: [facility] CATH LAB;  Service: Cardiovascular;  Laterality: N/A;   PERCUTANEOUS CORONARY STENT INTERVENTION (PCI-S)  05/2013   mCx 3.64mm x 20 mm Promus P DES   PERCUTANEOUS CORONARY STENT INTERVENTION (PCI-S)  05/12/2013   Procedure: PERCUTANEOUS CORONARY STENT INTERVENTION (PCI-S);  Surgeon: Lesleigh Noe, MD;  Location: [facility] CATH LAB;  Service: Cardiovascular;;   Social History   Occupational History   Occupation: floor maintenance    Employer: [employer]   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

## 2022-09-04 LAB — HM DIABETES EYE EXAM

## 2022-09-08 ENCOUNTER — Encounter: Payer: Self-pay | Admitting: Surgery

## 2022-09-08 ENCOUNTER — Ambulatory Visit (HOSPITAL_COMMUNITY)
Admission: RE | Admit: 2022-09-08 | Discharge: 2022-09-08 | Disposition: A | Discharge status: Home / Self Care | Payer: BC Managed Care – PPO | Source: Ambulatory Visit | Attending: Surgery | Admitting: Surgery

## 2022-09-08 ENCOUNTER — Ambulatory Visit (INDEPENDENT_AMBULATORY_CARE_PROVIDER_SITE_OTHER): Payer: BC Managed Care – PPO | Admitting: Surgery

## 2022-09-08 VITALS — BP 160/90 | HR 91 | Temp 98.3°F | Resp 20 | Ht 72.0 in | Wt 279.0 lb

## 2022-09-08 DIAGNOSIS — I872 Venous insufficiency (chronic) (peripheral): Secondary | ICD-10-CM | POA: Diagnosis not present

## 2022-09-08 DIAGNOSIS — R600 Localized edema: Secondary | ICD-10-CM | POA: Diagnosis present

## 2022-09-08 DIAGNOSIS — L97424 Non-pressure chronic ulcer of left heel and midfoot with necrosis of bone: Secondary | ICD-10-CM

## 2022-09-08 DIAGNOSIS — E08621 Diabetes mellitus due to underlying condition with foot ulcer: Secondary | ICD-10-CM

## 2022-09-08 NOTE — Progress Notes (Signed)
Vascular and Vein Specialist of [facility]  Patient name: Aaron Franco MRN: [id number] DOB: [birth year] Sex: male   REASON FOR VISIT:    Follow up  HISOTRY OF PRESENT ILLNESS:    Aaron Franco is a 51 y.o. male who is back today for follow-up.  I initially saw him in March 2022 for right greater than left leg swelling.  At that time, he was complaining of some burning and tingling feelings in his feet.  He is a diabetic and a former smoker.  He is on a statin for hypercholesterolemia and medically managed for hypertension.  He does have history of coronary artery disease, status post PCI in 2014.  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  He underwent left transmetatarsal amputation by Dr. Sharol Given on 07/25/2022.   PAST MEDICAL HISTORY:   Past Medical History:  Diagnosis Date   CAD (coronary artery disease)    a. 05/2013 NSTEMI/PCI: mCFX 100%-> 3.0 mm  x 20 mm Promus premier DES, EF 65%;  01/2014 Cath: LM nl, LAD 20-30d, LCX patent distal stent, RCA 30-59m, EF 55-65%.   Diabetes mellitus    Essential hypertension    GERD (gastroesophageal reflux disease)    Gout    History of echocardiogram    a. 03/2011 Echo: EF 65%, mild LVH, no rwma, mildly dil LA.   Hyperlipidemia    MI, old    Microalbuminuria due to type 2 diabetes mellitus ([place]) 12/14/2017   Obesity      FAMILY HISTORY:   Family History  Problem Relation Age of Onset   Heart attack Father        deceased   Diabetes Father    Congestive Heart Failure Father    Stroke Mother    Pancreatic cancer Maternal Aunt    Colon cancer Neg Hx     SOCIAL HISTORY:   Social History   Tobacco Use   Smoking status: Former    Types: Cigarettes    Quit date: 11/10/1998    Years since quitting: 23.8   Smokeless tobacco: Never   Tobacco comments:    pt only smoked 1-2 cigs a day, not everyday on occs x 2 years   Substance Use Topics   Alcohol use: Yes    Comment: rare     ALLERGIES:   Allergies  Allergen Reactions   Influenza Vaccines Anaphylaxis    Reaction June 2017   Influenza Virus Vaccine H5n1 Anaphylaxis   Fenofibrate Other (See Comments)    Hot flashes   Metformin Other (See Comments)    Chest discomfort   Prednisone Other (See Comments)    Mood swings   Augmentin [Amoxicillin-Pot Clavulanate] Rash    07/2022 tolerated cefazolin   Latex Rash     CURRENT MEDICATIONS:   Current Outpatient Medications  Medication Sig Dispense Refill   acetaminophen (TYLENOL) 325 MG tablet Take 2 tablets (650 mg total) by mouth every 4 (four) hours as needed for headache or mild pain.     allopurinol (ZYLOPRIM) 100 MG tablet TAKE 1 TABLET(100 MG) BY MOUTH DAILY (Patient taking differently: Take 100 mg by mouth daily as needed (as directed- for gout).) 90 tablet 0   alum & mag hydroxide-simeth (MAALOX/MYLANTA) 200-200-20 MG/5ML suspension Take 15-30 mLs by mouth every 2 (two) hours as needed for indigestion. 355 mL 0   amLODipine (NORVASC) 10 MG tablet Take 1 tablet (10 mg total) by mouth daily. 90 tablet 3  aspirin 81 MG chewable tablet Chew 1 tablet (81 mg total) by mouth daily. (Patient taking differently: Chew 81 mg by mouth daily at 12 noon.)     bisacodyl (DULCOLAX) 5 MG EC tablet Take 1 tablet (5 mg total) by mouth daily as needed for moderate constipation. 30 tablet 0   cyanocobalamin (VITAMIN B12) 500 MCG tablet Take 1 tablet (500 mcg total) by mouth daily. 90 tablet 1   dapagliflozin propanediol (FARXIGA) 5 MG TABS tablet TAKE 1 TABLET(5 MG) BY MOUTH DAILY BREAKFAST (Patient taking differently: Take 5 mg by mouth daily at 12 noon.) 90 tablet 3   dexlansoprazole (DEXILANT) 60 MG capsule TAKE 1 CAPSULE(60 MG) BY MOUTH DAILY (Patient taking differently: Take 60 mg by mouth daily.) 90 capsule 2   docusate sodium (COLACE) 100 MG capsule Take 1 capsule (100 mg total) by mouth daily. 10 capsule  0   Evolocumab (REPATHA SURECLICK) [id number] MG/ML SOAJ Inject 140 mLs into the skin every 14 (fourteen) days. 6 mL 3   fluticasone (FLONASE) 50 MCG/ACT nasal spray Place 1 spray into both nostrils daily as needed for allergies or rhinitis. 18.2 mL 6   Glycerin-Polysorbate 80 (REFRESH DRY EYE THERAPY OP) Place 1 drop into both eyes 3 (three) times daily as needed (for dryness).     insulin degludec (TRESIBA FLEXTOUCH) 100 UNIT/ML FlexTouch Pen Inject 20 Units into the skin daily. 45 mL 0   Insulin Pen Needle 31G X 8 MM MISC 1 Device by Does not apply route in the morning, at noon, in the evening, and at bedtime. 400 each 2   nebivolol (BYSTOLIC) 5 MG tablet TAKE 1 TABLET(5 MG) BY MOUTH DAILY (Patient taking differently: Take 5 mg by mouth at bedtime.) 90 tablet 0   nitroGLYCERIN (NITROSTAT) 0.4 MG SL tablet Place 1 tablet (0.4 mg total) under the tongue every 5 (five) minutes x 3 doses as needed for chest pain. 25 tablet 2   oxyCODONE (OXY IR/ROXICODONE) 5 MG immediate release tablet Take 1-2 tablets (5-10 mg total) by mouth every 4 (four) hours as needed for moderate pain (pain score 4-6). 30 tablet 0   polyethylene glycol (MIRALAX / GLYCOLAX) 17 g packet Take 17 g by mouth 2 (two) times daily. 14 each 0   rosuvastatin (CRESTOR) 40 MG tablet TAKE 1 TABLET(40 MG) BY MOUTH AT BEDTIME 90 tablet 2   No current facility-administered medications for this visit.    REVIEW OF SYSTEMS:   [X]  denotes positive finding, [ ]  denotes negative finding Cardiac  Comments:  Chest pain or chest pressure:    Shortness of breath upon exertion:    Short of breath when lying flat:    Irregular heart rhythm:        Vascular    Pain in calf, thigh, or hip brought on by ambulation:    Pain in feet at night that wakes you up from your sleep:     Blood clot in your veins:    Leg swelling:         Pulmonary    Oxygen at home:    Productive cough:     Wheezing:         Neurologic    Sudden weakness in arms or legs:      Sudden numbness in arms or legs:     Sudden onset of difficulty speaking or slurred speech:    Temporary loss of vision in one eye:     Problems with dizziness:  Gastrointestinal    Blood in stool:     Vomited blood:         Genitourinary    Burning when urinating:     Blood in urine:        Psychiatric    Major depression:         Hematologic    Bleeding problems:    Problems with blood clotting too easily:        Skin    Rashes or ulcers:        Constitutional    Fever or chills:      PHYSICAL EXAM:   Vitals:   09/08/22 1357  BP: (!) 160/90  Pulse: 91  Resp: 20  Temp: 98.3 F (36.8 C)  SpO2: 95%  Weight: 279 lb (126.6 kg)  Height: 6' (1.829 m)    GENERAL: The patient is a well-nourished male, in no acute distress. The vital signs are documented above. CARDIAC: There is a regular rate and rhythm.  VASCULAR: Bilateral edema.  Palpable pedal pulses. PULMONARY: Non-labored respirations ABDOMEN: Soft and non-tender with normal pitched bowel sounds.  MUSCULOSKELETAL: Left TMA is healing appropriately  SKIN: There are no ulcers or rashes noted. PSYCHIATRIC: The patient has a normal affect.  STUDIES:   I have reviewed the vascular studies with the following findings: +-------+-----------+-----------+------------+------------+  ABI/TBIToday's ABIToday's TBIPrevious ABIPrevious TBI  +-------+-----------+-----------+------------+------------+  Right  [place]         1.01                                 +-------+-----------+-----------+------------+------------+  Left   [place]         amp                                  +-------+-----------+-----------+------------+------------+  Right toe pressure: 175 All waveforms at the ankle are triphasic MEDICAL ISSUES:   Leg swelling: I have encouraged him to continue wearing his compression socks, however I would not like for him to wear a sock until the TMA has completely healed.  I had not  recommended venous ablation because I was unimpressed with the size of the vein when I looked at it with [employer].  In addition with his diabetes and history of coronary disease, I did not want to remove a potential conduit.  PAD: The patient is healing his transmetatarsal amputation site appropriately.  Vascular arterial studies were performed today.  He had triphasic waveforms bilaterally.  The toe pressure on the right was 175.  No further vascular intervention was recommended.  I have encouraged the patient to examine his feet on a daily basis    Annamarie Major, IV, MD, FACS [facility] [phone] Pager [phone]

## 2022-09-09 DIAGNOSIS — E1165 Type 2 diabetes mellitus with hyperglycemia: Secondary | ICD-10-CM | POA: Insufficient documentation

## 2022-09-09 DIAGNOSIS — Z794 Long term (current) use of insulin: Secondary | ICD-10-CM | POA: Insufficient documentation

## 2022-09-10 ENCOUNTER — Encounter: Payer: Self-pay | Admitting: Surgery

## 2022-09-11 ENCOUNTER — Other Ambulatory Visit: Payer: Self-pay | Admitting: Family Medicine

## 2022-09-11 DIAGNOSIS — I2510 Atherosclerotic heart disease of native coronary artery without angina pectoris: Secondary | ICD-10-CM

## 2022-09-12 ENCOUNTER — Ambulatory Visit: Payer: BC Managed Care – PPO | Admitting: Cardiology

## 2022-09-17 ENCOUNTER — Encounter: Payer: Self-pay | Admitting: Family Medicine

## 2022-09-18 NOTE — Telephone Encounter (Signed)
Form printed and placed on providers desk to be filled out.  Dm/cma

## 2022-09-26 ENCOUNTER — Other Ambulatory Visit: Payer: Self-pay | Admitting: Family Medicine

## 2022-09-26 DIAGNOSIS — E7849 Other hyperlipidemia: Secondary | ICD-10-CM

## 2022-09-26 DIAGNOSIS — G629 Polyneuropathy, unspecified: Secondary | ICD-10-CM

## 2022-09-26 DIAGNOSIS — I10 Essential (primary) hypertension: Secondary | ICD-10-CM

## 2022-09-26 DIAGNOSIS — I2510 Atherosclerotic heart disease of native coronary artery without angina pectoris: Secondary | ICD-10-CM

## 2022-09-26 DIAGNOSIS — K219 Gastro-esophageal reflux disease without esophagitis: Secondary | ICD-10-CM

## 2022-09-29 ENCOUNTER — Encounter: Payer: Self-pay | Admitting: Family Medicine

## 2022-09-29 ENCOUNTER — Other Ambulatory Visit: Payer: Self-pay | Admitting: Family Medicine

## 2022-09-29 DIAGNOSIS — E1140 Type 2 diabetes mellitus with diabetic neuropathy, unspecified: Secondary | ICD-10-CM

## 2022-09-30 NOTE — Telephone Encounter (Signed)
Pt has been notified of medications sent to pharmacy, as well as medication refusal. Pt has verbalized understanding.

## 2022-10-06 ENCOUNTER — Ambulatory Visit: Payer: BC Managed Care – PPO | Admitting: Dietician

## 2022-10-09 ENCOUNTER — Encounter: Payer: Self-pay | Admitting: Family Medicine

## 2022-10-14 ENCOUNTER — Encounter: Payer: Self-pay | Admitting: Family Medicine

## 2022-10-14 ENCOUNTER — Ambulatory Visit (INDEPENDENT_AMBULATORY_CARE_PROVIDER_SITE_OTHER): Payer: BC Managed Care – PPO | Admitting: Family Medicine

## 2022-10-14 VITALS — BP 126/78 | HR 81 | Temp 97.8°F | Ht 72.0 in | Wt 287.0 lb

## 2022-10-14 DIAGNOSIS — Z89432 Acquired absence of left foot: Secondary | ICD-10-CM

## 2022-10-14 DIAGNOSIS — Z794 Long term (current) use of insulin: Secondary | ICD-10-CM | POA: Diagnosis not present

## 2022-10-14 DIAGNOSIS — E1142 Type 2 diabetes mellitus with diabetic polyneuropathy: Secondary | ICD-10-CM | POA: Diagnosis not present

## 2022-10-14 DIAGNOSIS — E1140 Type 2 diabetes mellitus with diabetic neuropathy, unspecified: Secondary | ICD-10-CM

## 2022-10-14 NOTE — Progress Notes (Signed)
[facility] [address] [place] [address] Dept: [phone] Dept Fax: [phone]  Chronic Care Office Visit  Subjective:    Patient ID: Aaron Franco, male    DOB: [birth year], 51 y.o..   MRN: [id number]  Chief Complaint  Patient presents with   Acute Visit    C/o having Left leg swelling and needs additional time off from work.  Form for diabetic shoes. Handicap placard.     History of Present Illness:  Aaron Franco is in today regarding some increased swelling in his left foot and leg. He had a left transmetatarsal amputation secondary to a diabetic foot infection in mid-Sept. He has been progressing in his healing. He notes with periods of standing, he is getting some more swelling in his foot. He has peripheral neuropathy, that involves some numbness and burning pain in the legs. He has had issues with uncontrolled diabetes. Since his last visit, he established with Aaron Docker, NP (endocrinology). He continues to be on dapagliflozin [place]) 5 mg daily and insulin degludec 30 units bid. She has also added Ozempic to his therapy.  Past Medical History: Patient Active Problem List   Diagnosis Date Noted   Type 2 diabetes mellitus with both eyes affected by mild nonproliferative retinopathy without macular edema, with long-term current use of insulin ([place]) 09/09/2022   S/P transmetatarsal amputation of foot, left ([place]) 08/18/2022   Cellulitis and abscess of foot 07/24/2022   Sepsis ([place]) 07/24/2022   Normocytic anemia 07/24/2022   Diabetic foot infection ([place]) 07/23/2022   Chronic venous insufficiency of lower extremity 01/23/2021   Type 2 diabetes mellitus with hyperglycemia, with long-term current use of insulin ([place]) 12/07/2020   Allergic rhinitis due to pollen 06/29/2020   Diabetic neuropathy ([place]) 03/13/2020   Erectile dysfunction due to arterial insufficiency 03/13/2020   History of gout 11/28/2019    Coronary artery disease involving native heart without angina pectoris 03/29/2019   Onychomycosis 12/01/2017   Obesity (BMI 30-39.9) 02/18/2016   Chest pain with moderate risk of acute coronary syndrome 09/17/2015   Type 2 diabetes mellitus with diabetic neuropathy, unspecified ([place]) 04/23/2015   Thrush 04/23/2015   Type II diabetes mellitus with renal manifestations ([place]) 02/16/2014   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   Hammer toe, acquired 02/16/2014   CAD S/P percutaneous coronary angioplasty -  01/24/2014   Cardiac murmur 02/21/2011   Bilateral lower extremity edema 12/24/2010   Vitamin B12 deficiency 07/19/2010   Anemia due to vitamin B12 deficiency 10/31/2009   Trigger finger 10/16/2009   Dyslipidemia 03/01/2007   Gout 03/01/2007   Essential hypertension 03/01/2007   Gastroesophageal reflux disease 03/01/2007   Past Surgical History:  Procedure Laterality Date   AMPUTATION Left 07/25/2022   Procedure: AMPUTATION LEFT FOOT THROUGH MIDDLE OF FOOT;  Surgeon: Newt Minion, MD;  Location: [place];  Service: Orthopedics;  Laterality: Left;   BICEPS TENDON REPAIR  Nov 13 2010   right   HAND SURGERY  1990   left   LEFT HEART CATH N/A 05/12/2013   Procedure: LEFT HEART CATH;  Surgeon: Sinclair Grooms, MD;  Location: [facility] CATH LAB;  Service: Cardiovascular;  Laterality: N/A;   LEFT HEART CATHETERIZATION WITH CORONARY ANGIOGRAM N/A 01/24/2014   Procedure: LEFT HEART CATHETERIZATION WITH CORONARY ANGIOGRAM;  Surgeon: Peter M Martinique, MD;  Location: [facility] CATH LAB;  Service: Cardiovascular;  Laterality: N/A;   PERCUTANEOUS CORONARY STENT INTERVENTION (PCI-S)  05/2013   mCx 3.51mm x 20 mm  Promus P DES   PERCUTANEOUS CORONARY STENT INTERVENTION (PCI-S)  05/12/2013   Procedure: PERCUTANEOUS CORONARY STENT INTERVENTION (PCI-S);  Surgeon: Lesleigh Noe, MD;  Location: [facility] C[facility] LAB;  Service: Cardiovascular;;   Family History  Problem Relation Age of Onset   Heart attack Father        deceased    Diabetes Father    Congestive Heart Failure Father    Stroke Mother    Pancreatic cancer Maternal Aunt    Colon cancer Neg Hx    Outpatient Medications Prior to Visit  Medication Sig Dispense Refill   acetaminophen (TYLENOL) 325 MG tablet Take 2 tablets (650 mg total) by mouth every 4 (four) hours as needed for headache or mild pain.     allopurinol (ZYLOPRIM) 100 MG tablet TAKE 1 TABLET(100 MG) BY MOUTH DAILY (Patient taking differently: Take 100 mg by mouth daily as needed (as directed- for gout).) 90 tablet 0   alum & mag hydroxide-simeth (MAALOX/MYLANTA) 200-200-20 MG/5ML suspension Take 15-30 mLs by mouth every 2 (two) hours as needed for indigestion. 355 mL 0   amLODipine (NORVASC) 10 MG tablet NEW PRESCRIPTION REQUEST: TAKE ONE TABLET BY MOUTH DAILY 90 tablet 3   aspirin 81 MG chewable tablet Chew 1 tablet (81 mg total) by mouth daily. (Patient taking differently: Chew 81 mg by mouth daily at 12 noon.)     bisacodyl (DULCOLAX) 5 MG EC tablet Take 1 tablet (5 mg total) by mouth daily as needed for moderate constipation. 30 tablet 0   dapagliflozin propanediol (FARXIGA) 5 MG TABS tablet NEW PRESCRIPTION REQUEST: TAKE ONE TABLET BY MOUTH DAILY 90 tablet 3   dexlansoprazole (DEXILANT) 60 MG capsule Take 1 capsule (60 mg total) by mouth daily. 90 capsule 3   docusate sodium (COLACE) 100 MG capsule Take 1 capsule (100 mg total) by mouth daily. 10 capsule 0   Evolocumab (REPATHA SURECLICK) 140 MG/ML SOAJ Inject 140 mLs into the skin every 14 (fourteen) days. 6 mL 3   fluticasone (FLONASE) 50 MCG/ACT nasal spray Place 1 spray into both nostrils daily as needed for allergies or rhinitis. 18.2 mL 6   Glycerin-Polysorbate 80 (REFRESH DRY EYE THERAPY OP) Place 1 drop into both eyes 3 (three) times daily as needed (for dryness).     insulin degludec (TRESIBA FLEXTOUCH) 100 UNIT/ML FlexTouch Pen Inject 30 Units into the skin 2 (two) times daily. 45 mL 5   Insulin Pen Needle 31G X 8 MM MISC 1 Device by  Does not apply route in the morning, at noon, in the evening, and at bedtime. 400 each 2   nebivolol (BYSTOLIC) 5 MG tablet NEW PRESCRIPTION REQUEST: TAKE ONE TABLET BY MOUTH DAILY 90 tablet 3   nitroGLYCERIN (NITROSTAT) 0.4 MG SL tablet DISSOLVE 1 TABLET UNDER THE TONGUE EVERY 5 MINUTES AS NEEDED FOR CHEST PAIN. DO NOT EXCEED A TOTAL OF 3 DOSES IN 15 MINUTES. 25 tablet 2   oxyCODONE (OXY IR/ROXICODONE) 5 MG immediate release tablet Take 1-2 tablets (5-10 mg total) by mouth every 4 (four) hours as needed for moderate pain (pain score 4-6). 30 tablet 0   OZEMPIC, 0.25 OR 0.5 MG/DOSE, 2 MG/3ML SOPN Inject into the skin.     polyethylene glycol (MIRALAX / GLYCOLAX) 17 g packet Take 17 g by mouth 2 (two) times daily. 14 each 0   rosuvastatin (CRESTOR) 40 MG tablet NEW PRESCRIPTION REQUEST: TAKE ONE TABLET BY MOUTH DAILY 90 tablet 3   vitamin B-12 (CYANOCOBALAMIN) 500 MCG tablet  NEW PRESCRIPTION REQUEST: TAKE ONE TABLET BY MOUTH DAILY 90 tablet 3   No facility-administered medications prior to visit.   Allergies  Allergen Reactions   Influenza Vaccines Anaphylaxis    Reaction June 2017   Influenza Virus Vaccine H5n1 Anaphylaxis   Fenofibrate Other (See Comments)    Hot flashes   Metformin Other (See Comments)    Chest discomfort   Prednisone Other (See Comments)    Mood swings   Augmentin [Amoxicillin-Pot Clavulanate] Rash    07/2022 tolerated cefazolin   Latex Rash   Objective:   Today's Vitals   10/14/22 1408  BP: 126/78  Pulse: 81  Temp: 97.8 F (36.6 C)  TempSrc: Temporal  SpO2: 96%  Weight: 287 lb (130.2 kg)  Height: 6' (1.829 m)   Body mass index is 38.92 kg/m.   General: Well developed, well nourished. No acute distress. Extremities: The left foot shows mild swelling. The surgical wound is mostly healed, with two   areas of residual eschar. There is some peeling to the skin at the heel. Psych: Alert and oriented. Normal mood and affect.  Health Maintenance Due  Topic  Date Due   COVID-19 Vaccine (1) Never done   OPHTHALMOLOGY EXAM  Never done   Hepatitis C Screening  Never done   COLONOSCOPY (Pts 45-55yrs Insurance coverage will need to be confirmed)  Never done   Zoster Vaccines- Shingrix (1 of 2) Never done   Diabetic kidney evaluation - Urine ACR  03/15/2021   FOOT EXAM  01/23/2022   DTaP/Tdap/Td (4 - Td or Tdap) 10/10/2022     Assessment & Plan:   1. S/P transmetatarsal amputation of foot, left ([place]) I think it is very reasonable that we extend the anticipated time for recovery to be able to return to work out another month. 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 He ultimately may not be able to maintain both jobs, so should consider this potentiality. I will complete a revised FMLA and disability paperwork. I also provided a Disabled Surveyor, minerals form.  2. Type 2 diabetes mellitus with diabetic neuropathy, with long-term current use of insulin [facility]) Working with endocrinology now. Continue dapagliflozin (Farxiga) 5 mg daily, insulin degludec 30 units bid, and Ozempic 0.5 mg weekly.  3. Diabetic polyneuropathy associated with type 2 diabetes mellitus ([place]) We will continue to monitor this for now. We might consider a trial of gabapentin if the neuropathy pain remains an issue.  Return in about 6 weeks (around 11/25/2022) for Reassessment.   Haydee Salter, MD

## 2022-10-15 ENCOUNTER — Encounter: Payer: Self-pay | Admitting: Family Medicine

## 2022-10-15 ENCOUNTER — Telehealth: Payer: Self-pay

## 2022-10-15 ENCOUNTER — Other Ambulatory Visit: Payer: Self-pay | Admitting: Family Medicine

## 2022-10-15 DIAGNOSIS — K649 Unspecified hemorrhoids: Secondary | ICD-10-CM

## 2022-10-15 DIAGNOSIS — K219 Gastro-esophageal reflux disease without esophagitis: Secondary | ICD-10-CM

## 2022-10-15 MED ORDER — HYDROCORTISONE ACETATE 25 MG RE SUPP: 25.0000 mg | Freq: Two times a day (BID) | RECTAL | 0 refills | Status: DC

## 2022-10-15 MED ORDER — DEXLANSOPRAZOLE 60 MG PO CPDR: 60.0000 mg | DELAYED_RELEASE_CAPSULE | Freq: Every day | ORAL | 2 refills | Status: DC

## 2022-10-15 NOTE — Telephone Encounter (Signed)
Diabetic foot form faxed to [phone], Letter to extend time off faxed to [facility] @ [phone], short term disability form faxed to [phone].    Copy placed in chart and copy mailed to home address. Dm/cma

## 2022-10-17 ENCOUNTER — Other Ambulatory Visit: Payer: Self-pay | Admitting: Family Medicine

## 2022-10-17 DIAGNOSIS — K219 Gastro-esophageal reflux disease without esophagitis: Secondary | ICD-10-CM

## 2022-10-17 MED ORDER — PANTOPRAZOLE SODIUM 40 MG PO TBEC: 40.0000 mg | DELAYED_RELEASE_TABLET | Freq: Every day | ORAL | 3 refills | Status: DC

## 2022-10-17 NOTE — Progress Notes (Signed)
Received notice that dexlansoprazole is not covered by patient's insurance. I will substitute pantoprazole.  Loyola Mast, MD

## 2022-10-31 ENCOUNTER — Encounter: Payer: Self-pay | Admitting: Family Medicine

## 2022-10-31 MED ORDER — ROSUVASTATIN CALCIUM 40 MG PO TABS: ORAL_TABLET | ORAL | 3 refills | Status: DC

## 2022-11-06 ENCOUNTER — Encounter: Payer: Self-pay | Admitting: Family Medicine

## 2022-11-11 ENCOUNTER — Encounter: Payer: Self-pay | Admitting: Family Medicine

## 2022-11-11 ENCOUNTER — Telehealth: Payer: Self-pay | Admitting: Family Medicine

## 2022-11-11 ENCOUNTER — Ambulatory Visit (INDEPENDENT_AMBULATORY_CARE_PROVIDER_SITE_OTHER): Payer: BC Managed Care – PPO | Admitting: Family Medicine

## 2022-11-11 VITALS — BP 144/76 | HR 89 | Temp 97.1°F | Ht 72.0 in | Wt 297.8 lb

## 2022-11-11 DIAGNOSIS — I872 Venous insufficiency (chronic) (peripheral): Secondary | ICD-10-CM

## 2022-11-11 DIAGNOSIS — I10 Essential (primary) hypertension: Secondary | ICD-10-CM

## 2022-11-11 DIAGNOSIS — R2242 Localized swelling, mass and lump, left lower limb: Secondary | ICD-10-CM | POA: Diagnosis not present

## 2022-11-11 DIAGNOSIS — B351 Tinea unguium: Secondary | ICD-10-CM

## 2022-11-11 DIAGNOSIS — Z1211 Encounter for screening for malignant neoplasm of colon: Secondary | ICD-10-CM

## 2022-11-11 DIAGNOSIS — E1140 Type 2 diabetes mellitus with diabetic neuropathy, unspecified: Secondary | ICD-10-CM | POA: Diagnosis not present

## 2022-11-11 DIAGNOSIS — E1142 Type 2 diabetes mellitus with diabetic polyneuropathy: Secondary | ICD-10-CM

## 2022-11-11 DIAGNOSIS — Z794 Long term (current) use of insulin: Secondary | ICD-10-CM | POA: Diagnosis not present

## 2022-11-11 NOTE — Telephone Encounter (Signed)
PAPERWORK/FORMS received  Dropped off by: [name] Call back #: [phone] Individual made aware of 3-5 business day turn around (YES/NO): yes GREEN charge sheet completed and patient made aware of possible charge (YES/NO): yes Placed in provider folder at front desk. ~~~ route to CMA/provider Team  CLINICAL USE BELOW THIS LINE (use X to signify action taken)  ___ Form received and placed in providers office for signature. ___ Form completed and faxed to LOA Dept.  ___ Form completed & LVM to notify patient ready for pick up.  ___ Charge sheet and copy of form in front office folder for office supervisor.

## 2022-11-11 NOTE — Progress Notes (Signed)
[facility] [address] [place] [address] Dept: [phone] Dept Fax: [phone]  Chronic Care Office Visit  Subjective:    Patient ID: Aaron Franco, male    DOB: 11-Jun-1971, 52 y.o..   MRN: [id number]  Chief Complaint  Patient presents with   Acute Visit    Co still having LT leg/foot swelling and having back pain.      History of Present Illness:  Patient is in today for reassessment of chronic medical issues. Aaron Franco is in today regarding some increased swelling in his left foot and leg. He had a left transmetatarsal amputation secondary to a diabetic foot infection in mid-Sept. He has been progressing in his healing. He notes with periods of standing, he is getting more swelling in his foot. He has peripheral neuropathy, that involves some numbness and burning pain in the legs. He also has a history of pre-existing chronic venous insufficiency. He has not had any recent increase in dyspnea or orthopnea. He has been off work since the time of his amputation and was to return to work Architectural technologist.  Aaron Franco has a history of hypertension. he notes he did not take his medicine today. He usually is on amlodipine 10 mg daily and nebivolol 5 mg daily.  Aaron Franco has had issues with uncontrolled diabetes. he is followed by Prentice Docker, NP (endocrinology). He continues to be on dapagliflozin Wilder Glade) 5 mg daily, insulin degludec 30 units bid, and semaglutide (Ozempic) weekly.   Past Medical History: Patient Active Problem List   Diagnosis Date Noted   Type 2 diabetes mellitus with both eyes affected by mild nonproliferative retinopathy without macular edema, with long-term current use of insulin ([place]) 09/09/2022   S/P transmetatarsal amputation of foot, left ([place]) 08/18/2022   Cellulitis and abscess of foot 07/24/2022   Sepsis ([place]) 07/24/2022   Normocytic anemia 07/24/2022   Diabetic foot infection ([place])  07/23/2022   Chronic venous insufficiency of lower extremity 01/23/2021   Type 2 diabetes mellitus with hyperglycemia, with long-term current use of insulin ([place]) 12/07/2020   Allergic rhinitis due to pollen 06/29/2020   Diabetic neuropathy ([place]) 03/13/2020   Erectile dysfunction due to arterial insufficiency 03/13/2020   History of gout 11/28/2019   Coronary artery disease involving native heart without angina pectoris 03/29/2019   Onychomycosis 12/01/2017   Obesity (BMI 30-39.9) 02/18/2016   Chest pain with moderate risk of acute coronary syndrome 09/17/2015   Type 2 diabetes mellitus with diabetic neuropathy, unspecified ([place]) 04/23/2015   Thrush 04/23/2015   Type II diabetes mellitus with renal manifestations ([place]) 02/16/2014   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   Hammer toe, acquired 02/16/2014   CAD S/P percutaneous coronary angioplasty -  01/24/2014   Cardiac murmur 02/21/2011   Bilateral lower extremity edema 12/24/2010   Vitamin B12 deficiency 07/19/2010   Anemia due to vitamin B12 deficiency 10/31/2009   Trigger finger 10/16/2009   Dyslipidemia 03/01/2007   Gout 03/01/2007   Essential hypertension 03/01/2007   Gastroesophageal reflux disease 03/01/2007   Past Surgical History:  Procedure Laterality Date   AMPUTATION Left 07/25/2022   Procedure: AMPUTATION LEFT FOOT THROUGH MIDDLE OF FOOT;  Surgeon: Newt Minion, MD;  Location: [facility];  Service: Orthopedics;  Laterality: Left;   BICEPS TENDON REPAIR  Nov 13 2010   right   HAND SURGERY  1990   left   LEFT HEART CATH N/A 05/12/2013   Procedure: LEFT HEART CATH;  Surgeon: Belva Crome  III, MD;  Location: [facility] CATH LAB;  Service: Cardiovascular;  Laterality: N/A;   LEFT HEART CATHETERIZATION WITH CORONARY ANGIOGRAM N/A 01/24/2014   Procedure: LEFT HEART CATHETERIZATION WITH CORONARY ANGIOGRAM;  Surgeon: Peter M Martinique, MD;  [facility];  Service: Cardiovascular;  Laterality: N/A;   PERCUTANEOUS CORONARY STENT  INTERVENTION (PCI-S)  05/2013   mCx 3.37mm x 20 mm Promus P DES   PERCUTANEOUS CORONARY STENT INTERVENTION (PCI-S)  05/12/2013   Procedure: PERCUTANEOUS CORONARY STENT INTERVENTION (PCI-S);  Surgeon: Sinclair Grooms, MD;  Location: [facility] CATH LAB;  Service: Cardiovascular;;   Family History  Problem Relation Age of Onset   Heart attack Father        deceased   Diabetes Father    Congestive Heart Failure Father    Stroke Mother    Pancreatic cancer Maternal Aunt    Colon cancer Neg Hx    Outpatient Medications Prior to Visit  Medication Sig Dispense Refill   acetaminophen (TYLENOL) 325 MG tablet Take 2 tablets (650 mg total) by mouth every 4 (four) hours as needed for headache or mild pain.     allopurinol (ZYLOPRIM) 100 MG tablet TAKE 1 TABLET(100 MG) BY MOUTH DAILY (Patient taking differently: Take 100 mg by mouth daily as needed (as directed- for gout).) 90 tablet 0   alum & mag hydroxide-simeth (MAALOX/MYLANTA) 200-200-20 MG/5ML suspension Take 15-30 mLs by mouth every 2 (two) hours as needed for indigestion. 355 mL 0   amLODipine (NORVASC) 10 MG tablet NEW PRESCRIPTION REQUEST: TAKE ONE TABLET BY MOUTH DAILY 90 tablet 3   aspirin 81 MG chewable tablet Chew 1 tablet (81 mg total) by mouth daily. (Patient taking differently: Chew 81 mg by mouth daily at 12 noon.)     bisacodyl (DULCOLAX) 5 MG EC tablet Take 1 tablet (5 mg total) by mouth daily as needed for moderate constipation. 30 tablet 0   dapagliflozin propanediol (FARXIGA) 5 MG TABS tablet NEW PRESCRIPTION REQUEST: TAKE ONE TABLET BY MOUTH DAILY 90 tablet 3   docusate sodium (COLACE) 100 MG capsule Take 1 capsule (100 mg total) by mouth daily. 10 capsule 0   Evolocumab (REPATHA SURECLICK) [id number] MG/ML SOAJ Inject 140 mLs into the skin every 14 (fourteen) days. 6 mL 3   fluticasone (FLONASE) 50 MCG/ACT nasal spray Place 1 spray into both nostrils daily as needed for allergies or rhinitis. 18.2 mL 6   Glycerin-Polysorbate 80 (REFRESH DRY EYE  THERAPY OP) Place 1 drop into both eyes 3 (three) times daily as needed (for dryness).     hydrocortisone (ANUSOL-HC) 25 MG suppository Place 1 suppository (25 mg total) rectally 2 (two) times daily. 12 suppository 0   insulin degludec (TRESIBA FLEXTOUCH) 100 UNIT/ML FlexTouch Pen Inject 30 Units into the skin 2 (two) times daily. 45 mL 5   Insulin Pen Needle 31G X 8 MM MISC 1 Device by Does not apply route in the morning, at noon, in the evening, and at bedtime. 400 each 2   nebivolol (BYSTOLIC) 5 MG tablet NEW PRESCRIPTION REQUEST: TAKE ONE TABLET BY MOUTH DAILY 90 tablet 3   nitroGLYCERIN (NITROSTAT) 0.4 MG SL tablet DISSOLVE 1 TABLET UNDER THE TONGUE EVERY 5 MINUTES AS NEEDED FOR CHEST PAIN. DO NOT EXCEED A TOTAL OF 3 DOSES IN 15 MINUTES. 25 tablet 2   oxyCODONE (OXY IR/ROXICODONE) 5 MG immediate release tablet Take 1-2 tablets (5-10 mg total) by mouth every 4 (four) hours as needed for moderate pain (pain score 4-6). [employer]  tablet 0   OZEMPIC, 0.25 OR 0.5 MG/DOSE, 2 MG/3ML SOPN Inject into the skin.     pantoprazole (PROTONIX) 40 MG tablet Take 1 tablet (40 mg total) by mouth daily. 90 tablet 3   polyethylene glycol (MIRALAX / GLYCOLAX) 17 g packet Take 17 g by mouth 2 (two) times daily. 14 each 0   rosuvastatin (CRESTOR) 40 MG tablet NEW PRESCRIPTION REQUEST: TAKE ONE TABLET BY MOUTH DAILY 90 tablet 3   tadalafil (CIALIS) 20 MG tablet Take 20 mg by mouth daily as needed.     vitamin B-12 (CYANOCOBALAMIN) 500 MCG tablet NEW PRESCRIPTION REQUEST: TAKE ONE TABLET BY MOUTH DAILY 90 tablet 3   No facility-administered medications prior to visit.   Allergies  Allergen Reactions   Influenza Vaccines Anaphylaxis    Reaction June 2017   Influenza Virus Vaccine H5n1 Anaphylaxis   Fenofibrate Other (See Comments)    Hot flashes   Metformin Other (See Comments)    Chest discomfort   Prednisone Other (See Comments)    Mood swings   Augmentin [Amoxicillin-Pot Clavulanate] Rash    07/2022 tolerated  cefazolin   Latex Rash     Objective:   Today's Vitals   11/11/22 1510  BP: (!) 144/76  Pulse: 89  Temp: (!) 97.1 F (36.2 C)  TempSrc: Temporal  SpO2: 96%  Weight: 297 lb 12.8 oz (135.1 kg)  Height: 6' (1.829 m)   Body mass index is 40.39 kg/m.   General: Well developed, well nourished. No acute distress. Extremities: Trace to 1+ edema of lower legs. Feet- Skin intact. Left midfoot amputation well healed. No tenderness to palpation. No fluctuance. Mild   increased warmth. Mild swelling. No sign of maceration between toes on right. Nails are thickened and   brown discolored. Dorsalis pedis and posterior tibial artery pulses are normal on the right. 5.07   monofilament testing insensate over the lateral foot.Marland Kitchen Psych: Alert and oriented. Normal mood and affect.  Health Maintenance Due  Topic Date Due   OPHTHALMOLOGY EXAM  Never done   Hepatitis C Screening  Never done   COLONOSCOPY (Pts 45-60yrs Insurance coverage will need to be confirmed)  Never done   Zoster Vaccines- Shingrix (1 of 2) Never done   Diabetic kidney evaluation - Urine ACR  03/15/2021   DTaP/Tdap/Td (4 - Td or Tdap) 10/10/2022      Assessment & Plan:   1. Type 2 diabetes mellitus with diabetic neuropathy, with long-term current use of insulin ([place]) Due for annual; kidney screening. I will also reassess his A1c to monitor if his recent changes in his diabetes meds has helped. He will continue dapagliflozin Wilder Glade) 5 mg daily, insulin degludec 30 units bid, and semaglutide (Ozempic) weekly.   - Microalbumin / creatinine urine ratio - Basic metabolic panel - Hemoglobin A1c - Urinalysis, Routine w reflex microscopic  2. Chronic venous insufficiency of lower extremity I suspect the swelling is due to his venous insufficiency. I did discuss the differential with Aaron Franco and his wife. This will likely always fluctuate.  3. Essential hypertension Blood pressure is high today, but he did not take his  medication. Continue amlodipine 10 mg daily and nebivolol 5 mg daily.  4. Localized swelling of left foot I suspect the foot is okay, but to be cautious I will check inflammatory markers and a CBC.  - C-reactive protein - Sedimentation rate - CBC with Differential/Platelet  5. Onychomycosis Recommend ongoing podiatry care. He did not connect with Dr. Paulla Dolly,  so will try to have him seen by Dr. Sherryle Lis.  - Ambulatory referral to Podiatry  6. Diabetic polyneuropathy associated with type 2 diabetes mellitus ([place]) As above.  - Ambulatory referral to Podiatry  7. Screening for colon cancer  - Cologuard   Return in about 2 weeks (around 11/25/2022) for Reassessment.   Haydee Salter, MD

## 2022-11-12 ENCOUNTER — Encounter: Payer: Self-pay | Admitting: Family Medicine

## 2022-11-12 DIAGNOSIS — N181 Chronic kidney disease, stage 1: Secondary | ICD-10-CM | POA: Insufficient documentation

## 2022-11-12 LAB — CBC WITH DIFFERENTIAL/PLATELET
Basophils Absolute: 0.1 10*3/uL (ref 0.0–0.1)
Basophils Relative: 1.3 % (ref 0.0–3.0)
Eosinophils Absolute: 0.1 10*3/uL (ref 0.0–0.7)
Eosinophils Relative: 1.2 % (ref 0.0–5.0)
HCT: 42.6 % (ref 39.0–52.0)
Hemoglobin: 13.7 g/dL (ref 13.0–17.0)
Lymphocytes Relative: 23.5 % (ref 12.0–46.0)
Lymphs Abs: 1.7 10*3/uL (ref 0.7–4.0)
MCHC: 32.1 g/dL (ref 30.0–36.0)
MCV: 77.1 fl — ABNORMAL LOW (ref 78.0–100.0)
Monocytes Absolute: 0.6 10*3/uL (ref 0.1–1.0)
Monocytes Relative: 8.7 % (ref 3.0–12.0)
Neutro Abs: 4.6 10*3/uL (ref 1.4–7.7)
Neutrophils Relative %: 65.3 % (ref 43.0–77.0)
Platelets: 291 10*3/uL (ref 150.0–400.0)
RBC: 5.52 Mil/uL (ref 4.22–5.81)
RDW: 16.9 % — ABNORMAL HIGH (ref 11.5–15.5)
WBC: 7.1 10*3/uL (ref 4.0–10.5)

## 2022-11-12 LAB — URINALYSIS, ROUTINE W REFLEX MICROSCOPIC
Bilirubin Urine: NEGATIVE
Hgb urine dipstick: NEGATIVE
Ketones, ur: NEGATIVE
Leukocytes,Ua: NEGATIVE
Nitrite: NEGATIVE
Specific Gravity, Urine: 1.02 (ref 1.000–1.030)
Total Protein, Urine: 30 — AB
Urine Glucose: >=1000 — AB
Urobilinogen, UA: 0.2 (ref 0.0–1.0)
pH: 6 (ref 5.0–8.0)

## 2022-11-12 LAB — MICROALBUMIN / CREATININE URINE RATIO
Creatinine,U: 58 mg/dL
Microalb Creat Ratio: 52.3 mg/g — ABNORMAL HIGH (ref 0.0–30.0)
Microalb, Ur: 30.4 mg/dL — ABNORMAL HIGH (ref 0.0–1.9)

## 2022-11-12 LAB — BASIC METABOLIC PANEL
BUN: 16 mg/dL (ref 6–23)
CO2: 25 mEq/L (ref 19–32)
Calcium: 9.6 mg/dL (ref 8.4–10.5)
Chloride: 102 mEq/L (ref 96–112)
Creatinine, Ser: 0.8 mg/dL (ref 0.40–1.50)
GFR: 101.86 mL/min (ref >60.00)
Glucose, Bld: 254 mg/dL — ABNORMAL HIGH (ref 70–99)
Potassium: 4.1 mEq/L (ref 3.5–5.1)
Sodium: 138 mEq/L (ref 135–145)

## 2022-11-12 LAB — SEDIMENTATION RATE: Sed Rate: 52 mm/hr — ABNORMAL HIGH (ref 0–20)

## 2022-11-12 LAB — C-REACTIVE PROTEIN: CRP: <1 mg/dL (ref 0.5–20.0)

## 2022-11-12 LAB — HEMOGLOBIN A1C: Hgb A1c MFr Bld: 10 % — ABNORMAL HIGH (ref 4.6–6.5)

## 2022-11-12 NOTE — Telephone Encounter (Signed)
Form faxed and patient informed VIA phone and my chart message. Dm/cma

## 2022-11-12 NOTE — Telephone Encounter (Signed)
Form filled out and waiting for the fax number at location to be faxed to be fixed.  Spoke to [employer] this morning and was advised she will call me back. Dm/cma

## 2022-11-20 NOTE — Progress Notes (Signed)
Cardiology Office Note   Date:  11/21/2022   ID:  Zackerie Sara, DOB 03-30-1971, MRN [id number]  PCP:  Haydee Salter, MD  Cardiologist:   Minus Breeding, MD   Chief Complaint  Patient presents with   Coronary Artery Disease      History of Present Illness: Curlie Macken is a 52 y.o. male who presents for follow up of CAD.  He has a history of of STEMI 2014 w/ DES CFX.  Cath demonstrated non obstructive disease in 2015.   He had chest pain in 2018 and a negative perfusion study.  We saw him in Feb 2021 he was in the ED with chest pain.  He had a low risk perfusion study.   He returns for follow up.  Since I last saw him he was in the hospital.  This was in September.  He had sepsis.  He had an infection in his foot and had to have amputation of all of the toes on his left foot.  I reviewed these records and impressively he did not have any cardiac complications.  Because he had some staph bacteremia he did have an echocardiogram which demonstrated well-preserved ejection fraction.  There was some mild aortic stenosis but a minimal gradient.  He returns for follow-up.  He says he is doing well.  His blood sugar had an A1c of 14 and is now down to 10.  He was started on Ozempic.  He has not been taking his PCSK9.  He has been taking his blood pressure medications.  He has not been exercising as much as I would like and he has not been necessarily following his diet but he is going to try.  He denies any new cardiovascular symptoms and has had no shortness of breath, PND or orthopnea.  He had no palpitations, presyncope or syncope.  He denies any chest pain.   Past Medical History:  Diagnosis Date   CAD (coronary artery disease)    a. 05/2013 NSTEMI/PCI: mCFX 100%-> 3.0 mm  x 20 mm Promus premier DES, EF 65%;  01/2014 Cath: LM nl, LAD 20-30d, LCX patent distal stent, RCA 30-28m, EF 55-65%.   Diabetes mellitus    Essential hypertension    GERD (gastroesophageal reflux disease)     Gout    History of echocardiogram    a. 03/2011 Echo: EF 65%, mild LVH, no rwma, mildly dil LA.   Hyperlipidemia    MI, old    Microalbuminuria due to type 2 diabetes mellitus ([place]) 12/14/2017   Obesity     Past Surgical History:  Procedure Laterality Date   AMPUTATION Left 07/25/2022   Procedure: AMPUTATION LEFT FOOT THROUGH MIDDLE OF FOOT;  Surgeon: Newt Minion, MD;  Location: [place];  Service: Orthopedics;  Laterality: Left;   BICEPS TENDON REPAIR  Nov 13 2010   right   HAND SURGERY  1990   left   LEFT HEART CATH N/A 05/12/2013   Procedure: LEFT HEART CATH;  Surgeon: Sinclair Grooms, MD;  Location: [facility] CATH LAB;  Service: Cardiovascular;  Laterality: N/A;   LEFT HEART CATHETERIZATION WITH CORONARY ANGIOGRAM N/A 01/24/2014   Procedure: LEFT HEART CATHETERIZATION WITH CORONARY ANGIOGRAM;  Surgeon: Peter M Martinique, MD;  Location: [facility] CATH LAB;  Service: Cardiovascular;  Laterality: N/A;   PERCUTANEOUS CORONARY STENT INTERVENTION (PCI-S)  05/2013   mCx 3.61mm x 20 mm Promus P DES   PERCUTANEOUS CORONARY STENT INTERVENTION (PCI-S)  05/12/2013  Procedure: PERCUTANEOUS CORONARY STENT INTERVENTION (PCI-S);  Surgeon: Lesleigh Noe, MD;  Location: [facility] CATH LAB;  Service: Cardiovascular;;     Current Outpatient Medications  Medication Sig Dispense Refill   acetaminophen (TYLENOL) 325 MG tablet Take 2 tablets (650 mg total) by mouth every 4 (four) hours as needed for headache or mild pain.     allopurinol (ZYLOPRIM) 100 MG tablet TAKE 1 TABLET(100 MG) BY MOUTH DAILY (Patient taking differently: Take 100 mg by mouth daily as needed (as directed- for gout).) 90 tablet 0   alum & mag hydroxide-simeth (MAALOX/MYLANTA) 200-200-20 MG/5ML suspension Take 15-30 mLs by mouth every 2 (two) hours as needed for indigestion. 355 mL 0   amLODipine (NORVASC) 10 MG tablet NEW PRESCRIPTION REQUEST: TAKE ONE TABLET BY MOUTH DAILY 90 tablet 3   aspirin 81 MG chewable tablet Chew 1 tablet (81 mg total) by mouth  daily. (Patient taking differently: Chew 81 mg by mouth daily at 12 noon.)     bisacodyl (DULCOLAX) 5 MG EC tablet Take 1 tablet (5 mg total) by mouth daily as needed for moderate constipation. 30 tablet 0   dapagliflozin propanediol (FARXIGA) 5 MG TABS tablet NEW PRESCRIPTION REQUEST: TAKE ONE TABLET BY MOUTH DAILY 90 tablet 3   docusate sodium (COLACE) 100 MG capsule Take 1 capsule (100 mg total) by mouth daily. 10 capsule 0   fluticasone (FLONASE) 50 MCG/ACT nasal spray Place 1 spray into both nostrils daily as needed for allergies or rhinitis. 18.2 mL 6   Glycerin-Polysorbate 80 (REFRESH DRY EYE THERAPY OP) Place 1 drop into both eyes 3 (three) times daily as needed (for dryness).     hydrocortisone (ANUSOL-HC) 25 MG suppository Place 1 suppository (25 mg total) rectally 2 (two) times daily. 12 suppository 0   insulin degludec (TRESIBA FLEXTOUCH) 100 UNIT/ML FlexTouch Pen Inject 30 Units into the skin 2 (two) times daily. 45 mL 5   Insulin Pen Needle 31G X 8 MM MISC 1 Device by Does not apply route in the morning, at noon, in the evening, and at bedtime. 400 each 2   nebivolol (BYSTOLIC) 5 MG tablet NEW PRESCRIPTION REQUEST: TAKE ONE TABLET BY MOUTH DAILY 90 tablet 3   nitroGLYCERIN (NITROSTAT) 0.4 MG SL tablet DISSOLVE 1 TABLET UNDER THE TONGUE EVERY 5 MINUTES AS NEEDED FOR CHEST PAIN. DO NOT EXCEED A TOTAL OF 3 DOSES IN 15 MINUTES. 25 tablet 2   oxyCODONE (OXY IR/ROXICODONE) 5 MG immediate release tablet Take 1-2 tablets (5-10 mg total) by mouth every 4 (four) hours as needed for moderate pain (pain score 4-6). 30 tablet 0   OZEMPIC, 0.25 OR 0.5 MG/DOSE, 2 MG/3ML SOPN Inject into the skin.     pantoprazole (PROTONIX) 40 MG tablet Take 1 tablet (40 mg total) by mouth daily. 90 tablet 3   polyethylene glycol (MIRALAX / GLYCOLAX) 17 g packet Take 17 g by mouth 2 (two) times daily. 14 each 0   rosuvastatin (CRESTOR) 40 MG tablet NEW PRESCRIPTION REQUEST: TAKE ONE TABLET BY MOUTH DAILY 90 tablet 3    tadalafil (CIALIS) 20 MG tablet Take 20 mg by mouth daily as needed.     vitamin B-12 (CYANOCOBALAMIN) 500 MCG tablet NEW PRESCRIPTION REQUEST: TAKE ONE TABLET BY MOUTH DAILY 90 tablet 3   Evolocumab (REPATHA SURECLICK) 140 MG/ML SOAJ Inject 140 mg into the skin every 14 (fourteen) days. 6 mL 3   No current facility-administered medications for this visit.    Allergies:   Influenza vaccines, Influenza  virus vaccine h5n1, Fenofibrate, Metformin, Prednisone, Augmentin [amoxicillin-pot clavulanate], and Latex    ROS:  Please see the history of present illness.   Otherwise, review of systems are positive for none.   All other systems are reviewed and negative.    PHYSICAL EXAM: VS:  BP (!) 159/91   Pulse 90   Ht 6' (1.829 m)   Wt 291 lb (132 kg)   SpO2 98%   BMI 39.47 kg/m  , BMI Body mass index is 39.47 kg/m. GENERAL:  Well appearing NECK:  No jugular venous distention, waveform within normal limits, carotid upstroke brisk and symmetric, no bruits, no thyromegaly LUNGS:  Clear to auscultation bilaterally CHEST:  Unremarkable HEART:  PMI not displaced or sustained,S1 and S2 within normal limits, no S3, no S4, no clicks, no rubs, positive 2 out of 6 apical systolic murmur radiating slightly at the aortic outflow tract, no diastolic no murmurs ABD:  Flat, positive bowel sounds normal in frequency in pitch, no bruits, no rebound, no guarding, no midline pulsatile mass, no hepatomegaly, no splenomegaly EXT:  2 plus pulses upper and diminished dorsalis pedis and posterior tibialis bilaterally, no edema, no cyanosis no clubbing, status post transmetatarsal amputation left foot  EKG:  EKG is not ordered today. The ekg ordered 07/23/2022 demonstrates sinus rhythm, rate 98, axis within normal limits, intervals within normal limits, no acute ST-T wave changes.   Recent Labs: 11/28/2021: TSH 1.180 07/17/2022: Pro B Natriuretic peptide (BNP) 34.0 08/11/2022: ALT 18 11/11/2022: BUN 16; Creatinine, Ser  0.80; Hemoglobin 13.7; Platelets 291.0; Potassium 4.1; Sodium 138    Lipid Panel    Component Value Date/Time   CHOL 352 (H) 11/28/2021 1026   TRIG 1,016 (HH) 11/28/2021 1026   HDL 34 (L) 11/28/2021 1026   CHOLHDL 10.4 (H) 11/28/2021 1026   CHOLHDL 7 03/13/2020 1636   VLDL UNABLE TO CALCULATE IF TRIGLYCERIDE OVER 400 mg/dL 05/05/2017 0008   LDLCALC Comment (A) 11/28/2021 1026   LDLDIRECT 151 (H) 11/28/2021 1026   LDLDIRECT 110.0 03/13/2020 1636      Wt Readings from Last 3 Encounters:  11/21/22 291 lb (132 kg)  11/11/22 297 lb 12.8 oz (135.1 kg)  10/14/22 287 lb (130.2 kg)      Other studies Reviewed: Additional studies/ records that were reviewed today include: Hospital records. Review of the above records demonstrates:   See elsewhere   ASSESSMENT AND PLAN:  CAD:    The patient has no new sypmtoms.  No further cardiovascular testing is indicated.  We will continue with aggressive risk reduction and meds as listed.  Hypertension:   The blood pressure is at elevated.  I am not quite convinced he has been compliant with his meds and he is encouraged to take the meds as listed and keep a blood pressure diary.  He also needs weight loss dietary changes.  Hyperlipidemia:   LDL was 151.  He has been off his Repatha.  I am going to reorder this and repeat a lipid profile in 3 months.   DM2:    A1c was 10.0 but he says this is down from 14.  He is now on Ozempic.  He is followed by an endocrinologist.  He understands importance of better diet.   Current medicines are reviewed at length with the patient today.  The patient does not have concerns regarding medicines.  The following changes have been made:  None    Labs/ tests ordered today include:     Orders Placed  This Encounter  Procedures   Lipid panel     Disposition:   FU with me in 4 months.    Signed, Rollene Rotunda, MD  11/21/2022 5:20 PM    [facility]

## 2022-11-21 ENCOUNTER — Ambulatory Visit: Payer: BC Managed Care – PPO | Attending: Cardiology | Admitting: Cardiology

## 2022-11-21 ENCOUNTER — Encounter: Payer: Self-pay | Admitting: Cardiology

## 2022-11-21 VITALS — BP 159/91 | HR 90 | Ht 72.0 in | Wt 291.0 lb

## 2022-11-21 DIAGNOSIS — I10 Essential (primary) hypertension: Secondary | ICD-10-CM

## 2022-11-21 DIAGNOSIS — E118 Type 2 diabetes mellitus with unspecified complications: Secondary | ICD-10-CM | POA: Diagnosis not present

## 2022-11-21 DIAGNOSIS — E785 Hyperlipidemia, unspecified: Secondary | ICD-10-CM | POA: Diagnosis not present

## 2022-11-21 DIAGNOSIS — I2510 Atherosclerotic heart disease of native coronary artery without angina pectoris: Secondary | ICD-10-CM | POA: Diagnosis not present

## 2022-11-21 DIAGNOSIS — E7849 Other hyperlipidemia: Secondary | ICD-10-CM

## 2022-11-21 MED ORDER — REPATHA SURECLICK 140 MG/ML ~~LOC~~ SOAJ: 140.0000 mg | SUBCUTANEOUS | 3 refills | Status: DC

## 2022-11-21 NOTE — Patient Instructions (Signed)
Medication Instructions:  Restart Repatha *If you need a refill on your cardiac medications before your next appointment, please call your pharmacy*   Lab Work: Your physician recommends that you return for lab work in: 3 months [place]  If you have labs (blood work) drawn today and your tests are completely normal, you will receive your results only by: [place] (if you have MyChart) OR A paper copy in the mail If you have any lab test that is abnormal or we need to change your treatment, we will call you to review the results.   Follow-Up: At [facility], you and your health needs are our priority.  As part of our continuing mission to provide you with exceptional heart care, we have created designated Provider Care Teams.  These Care Teams include your primary Cardiologist (physician) and Advanced Practice Providers (APPs -  Physician Assistants and Nurse Practitioners) who all work together to provide you with the care you need, when you need it.  We recommend signing up for the patient portal called "MyChart".  Sign up information is provided on this After Visit Summary.  MyChart is used to connect with patients for Virtual Visits (Telemedicine).  Patients are able to view lab/test results, encounter notes, upcoming appointments, etc.  Non-urgent messages can be sent to your provider as well.   To learn more about what you can do with MyChart, go to [email or web address].    Your next appointment:   4 month(s)  Provider:   Minus Breeding, MD

## 2022-11-25 ENCOUNTER — Ambulatory Visit (INDEPENDENT_AMBULATORY_CARE_PROVIDER_SITE_OTHER): Payer: BC Managed Care – PPO | Admitting: Family Medicine

## 2022-11-25 ENCOUNTER — Encounter: Payer: Self-pay | Admitting: Family Medicine

## 2022-11-25 VITALS — BP 148/76 | HR 84 | Temp 97.0°F | Ht 72.0 in | Wt 291.0 lb

## 2022-11-25 DIAGNOSIS — Z89432 Acquired absence of left foot: Secondary | ICD-10-CM | POA: Diagnosis not present

## 2022-11-25 DIAGNOSIS — E1142 Type 2 diabetes mellitus with diabetic polyneuropathy: Secondary | ICD-10-CM

## 2022-11-25 DIAGNOSIS — E1140 Type 2 diabetes mellitus with diabetic neuropathy, unspecified: Secondary | ICD-10-CM

## 2022-11-25 DIAGNOSIS — Z794 Long term (current) use of insulin: Secondary | ICD-10-CM

## 2022-11-25 DIAGNOSIS — I10 Essential (primary) hypertension: Secondary | ICD-10-CM

## 2022-11-25 DIAGNOSIS — N181 Chronic kidney disease, stage 1: Secondary | ICD-10-CM

## 2022-11-25 MED ORDER — LOSARTAN POTASSIUM 25 MG PO TABS: 25.0000 mg | ORAL_TABLET | Freq: Every day | ORAL | 3 refills | Status: DC

## 2022-11-25 NOTE — Progress Notes (Signed)
[facility] [address] [place] [address] Dept: [phone] Dept Fax: [phone]  Chronic Care Office Visit  Subjective:    Patient ID: Aaron Franco, male    DOB: [birth year], 52 y.o..   MRN: [id number]  Chief Complaint  Patient presents with   Follow-up    6 week f/u.       History of Present Illness:  Patient is in today for reassessment of chronic medical issues.  At his last visit, Aaron Franco had concern for increased swelling in his left foot and leg. He had a left transmetatarsal amputation secondary to a diabetic foot infection in mid-Sept. He has been progressing in his healing. He notes he still has some swelling, but that his foot and surgical site continue to look like they are doing well.  He notes issues with cramping in the leg when he is overly active. This remains a barrier to his effective return to work.   Aaron Franco has a history of hypertension. He notes he did not take his medicine until late today (about an hour prior to presentation). He is on amlodipine 10 mg daily and nebivolol 5 mg daily.   Aaron Franco has had issues with uncontrolled diabetes. He is followed by Aaron Docker, NP (endocrinology). He continues to be on dapagliflozin Wilder Glade) 5 mg daily, insulin degludec 30 units bid, and semaglutide (Ozempic) weekly.   Past Medical History: 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 without macular edema, with long-term current use of insulin ([place]) 09/09/2022   S/P transmetatarsal amputation of foot, left ([place]) 08/18/2022   Cellulitis and abscess of foot 07/24/2022   Sepsis ([place]) 07/24/2022    Normocytic anemia 07/24/2022   Diabetic foot infection ([place]) 07/23/2022   Chronic venous insufficiency of lower extremity 01/23/2021   Type 2 diabetes mellitus with hyperglycemia, with long-term current use of insulin ([place]) 12/07/2020   Allergic rhinitis due to pollen 06/29/2020   Diabetic neuropathy ([place]) 03/13/2020   Erectile dysfunction due to arterial insufficiency 03/13/2020   History of gout 11/28/2019   Coronary artery disease involving native heart without angina pectoris 03/29/2019   Onychomycosis 12/01/2017   Obesity (BMI 30-39.9) 02/18/2016   Chest pain with moderate risk of acute coronary syndrome 09/17/2015   Type 2 diabetes mellitus with diabetic neuropathy, unspecified ([place]) 04/23/2015   Thrush 04/23/2015   Type II diabetes mellitus with renal manifestations ([place]) 02/16/2014   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   Hammer toe, acquired 02/16/2014   CAD S/P percutaneous coronary angioplasty -  01/24/2014   Cardiac murmur 02/21/2011   Bilateral lower extremity edema 12/24/2010   Vitamin B12 deficiency 07/19/2010   Anemia due to vitamin B12 deficiency 10/31/2009   Trigger finger 10/16/2009   Dyslipidemia 03/01/2007   Gout 03/01/2007   Essential hypertension 03/01/2007   Gastroesophageal reflux disease 03/01/2007   Past Surgical History:  Procedure Laterality Date   AMPUTATION Left 07/25/2022   Procedure: AMPUTATION LEFT FOOT THROUGH MIDDLE OF FOOT;  Surgeon: Newt Minion, MD;  Location: [place];  Service: Orthopedics;  Laterality: Left;   BICEPS TENDON REPAIR  Nov 13 2010   right   HAND SURGERY  1990   left   LEFT HEART CATH  N/A 05/12/2013   Procedure: LEFT HEART CATH;  Surgeon: Sinclair Grooms, MD;  Location: [facility] CATH LAB;  Service: Cardiovascular;  Laterality: N/A;   LEFT HEART CATHETERIZATION WITH CORONARY ANGIOGRAM N/A 01/24/2014   Procedure: LEFT HEART CATHETERIZATION WITH CORONARY ANGIOGRAM;  Surgeon: Peter M Martinique, MD;  Location: [facility] CATH LAB;  Service:  Cardiovascular;  Laterality: N/A;   PERCUTANEOUS CORONARY STENT INTERVENTION (PCI-S)  05/2013   mCx 3.5mm x 20 mm Promus P DES   PERCUTANEOUS CORONARY STENT INTERVENTION (PCI-S)  05/12/2013   Procedure: PERCUTANEOUS CORONARY STENT INTERVENTION (PCI-S);  Surgeon: Sinclair Grooms, MD;  Location: [facility] CATH LAB;  Service: Cardiovascular;;   Family History  Problem Relation Age of Onset   Heart attack Father        deceased   Diabetes Father    Congestive Heart Failure Father    Stroke Mother    Pancreatic cancer Maternal Aunt    Colon cancer Neg Hx    Outpatient Medications Prior to Visit  Medication Sig Dispense Refill   acetaminophen (TYLENOL) 325 MG tablet Take 2 tablets (650 mg total) by mouth every 4 (four) hours as needed for headache or mild pain.     allopurinol (ZYLOPRIM) 100 MG tablet TAKE 1 TABLET(100 MG) BY MOUTH DAILY (Patient taking differently: Take 100 mg by mouth daily as needed (as directed- for gout).) 90 tablet 0   alum & mag hydroxide-simeth (MAALOX/MYLANTA) 200-200-20 MG/5ML suspension Take 15-30 mLs by mouth every 2 (two) hours as needed for indigestion. 355 mL 0   amLODipine (NORVASC) 10 MG tablet NEW PRESCRIPTION REQUEST: TAKE ONE TABLET BY MOUTH DAILY 90 tablet 3   aspirin 81 MG chewable tablet Chew 1 tablet (81 mg total) by mouth daily. (Patient taking differently: Chew 81 mg by mouth daily at 12 noon.)     bisacodyl (DULCOLAX) 5 MG EC tablet Take 1 tablet (5 mg total) by mouth daily as needed for moderate constipation. 30 tablet 0   dapagliflozin propanediol (FARXIGA) 5 MG TABS tablet NEW PRESCRIPTION REQUEST: TAKE ONE TABLET BY MOUTH DAILY 90 tablet 3   docusate sodium (COLACE) 100 MG capsule Take 1 capsule (100 mg total) by mouth daily. 10 capsule 0   Evolocumab (REPATHA SURECLICK) 376 MG/ML SOAJ Inject 140 mg into the skin every 14 (fourteen) days. 6 mL 3   fluticasone (FLONASE) 50 MCG/ACT nasal spray Place 1 spray into both nostrils daily as needed for allergies or  rhinitis. 18.2 mL 6   Glycerin-Polysorbate 80 (REFRESH DRY EYE THERAPY OP) Place 1 drop into both eyes 3 (three) times daily as needed (for dryness).     hydrocortisone (ANUSOL-HC) 25 MG suppository Place 1 suppository (25 mg total) rectally 2 (two) times daily. 12 suppository 0   insulin degludec (TRESIBA FLEXTOUCH) 100 UNIT/ML FlexTouch Pen Inject 30 Units into the skin 2 (two) times daily. 45 mL 5   Insulin Pen Needle 31G X 8 MM MISC 1 Device by Does not apply route in the morning, at noon, in the evening, and at bedtime. 400 each 2   nebivolol (BYSTOLIC) 5 MG tablet NEW PRESCRIPTION REQUEST: TAKE ONE TABLET BY MOUTH DAILY 90 tablet 3   nitroGLYCERIN (NITROSTAT) 0.4 MG SL tablet DISSOLVE 1 TABLET UNDER THE TONGUE EVERY 5 MINUTES AS NEEDED FOR CHEST PAIN. DO NOT EXCEED A TOTAL OF 3 DOSES IN 15 MINUTES. 25 tablet 2   oxyCODONE (OXY IR/ROXICODONE) 5 MG immediate release tablet Take 1-2 tablets (5-10 mg total) by mouth  every 4 (four) hours as needed for moderate pain (pain score 4-6). 30 tablet 0   OZEMPIC, 0.25 OR 0.5 MG/DOSE, 2 MG/3ML SOPN Inject into the skin.     pantoprazole (PROTONIX) 40 MG tablet Take 1 tablet (40 mg total) by mouth daily. 90 tablet 3   polyethylene glycol (MIRALAX / GLYCOLAX) 17 g packet Take 17 g by mouth 2 (two) times daily. 14 each 0   rosuvastatin (CRESTOR) 40 MG tablet NEW PRESCRIPTION REQUEST: TAKE ONE TABLET BY MOUTH DAILY 90 tablet 3   tadalafil (CIALIS) 20 MG tablet Take 20 mg by mouth daily as needed.     vitamin B-12 (CYANOCOBALAMIN) 500 MCG tablet NEW PRESCRIPTION REQUEST: TAKE ONE TABLET BY MOUTH DAILY 90 tablet 3   No facility-administered medications prior to visit.   Allergies  Allergen Reactions   Influenza Vaccines Anaphylaxis    Reaction June 2017   Influenza Virus Vaccine H5n1 Anaphylaxis   Fenofibrate Other (See Comments)    Hot flashes   Metformin Other (See Comments)    Chest discomfort   Prednisone Other (See Comments)    Mood swings    Augmentin [Amoxicillin-Pot Clavulanate] Rash    07/2022 tolerated cefazolin   Latex Rash     Objective:   Today's Vitals   11/25/22 1545  BP: (!) 148/76  Pulse: 84  Temp: (!) 97 F (36.1 C)  TempSrc: Temporal  SpO2: 96%  Weight: 291 lb (132 kg)  Height: 6' (1.829 m)   Body mass index is 39.47 kg/m.   General: Well developed, well nourished. No acute distress. Extremities: Left foot amputation site is well healed. There is 2+ edema on th left and trace edema of the   right lower leg. Psych: Alert and oriented. Normal mood and affect.  Health Maintenance Due  Topic Date Due   OPHTHALMOLOGY EXAM  Never done   Hepatitis C Screening  Never done   COLONOSCOPY (Pts 45-63yrs Insurance coverage will need to be confirmed)  Never done   Zoster Vaccines- Shingrix (1 of 2) Never done   DTaP/Tdap/Td (4 - Td or Tdap) 10/10/2022   Lab Results    Latest Ref Rng & Units 11/11/2022    3:59 PM 08/11/2022    3:51 PM 08/04/2022   10:56 AM  CBC  WBC 4.0 - 10.5 K/uL 7.1  5.9  9.4   Hemoglobin 13.0 - 17.0 g/dL 23.5  57.3  22.0   Hematocrit 39.0 - 52.0 % 42.6  39.3  39.1   Platelets 150.0 - 400.0 K/uL 291.0  400  579    Erythrocyte Sedimentation Rate     Component Value Date/Time   ESRSEDRATE 52 (H) 11/11/2022 1559   Lab Results  Component Value Date   HGBA1C 10.0 (H) 11/11/2022      Latest Ref Rng & Units 11/11/2022    3:59 PM 08/11/2022    3:51 PM 07/27/2022    6:54 AM  CMP  Glucose 70 - 99 mg/dL 254  270  623   BUN 6 - 23 mg/dL 16  17  12    Creatinine 0.40 - 1.50 mg/dL  7.62  8.31   Sodium 135 - 145 mEq/L 138  139  135   Potassium 3.5 - 5.1 mEq/L 4.1  4.6  4.3   Chloride 96 - 112 mEq/L 102  105  103   CO2 19 - 32 mEq/L 25  22  22    Calcium 8.4 - 10.5 mg/dL 9.6  9.3  8.5  Total Protein 6.1 - 8.1 g/dL  7.8    Total Bilirubin 0.2 - 1.2 mg/dL  0.3    AST 10 - 35 U/L  13    ALT 9 - 46 U/L  18           Component Ref Range & Units 2 wk ago 2 yr ago 13 yr ago  Microalb, Ur  0.0 - 1.9 mg/dL 83.3 High  38.3 High  2.91 High  R  Creatinine,U mg/dL 91.6 606.0   Microalb Creat Ratio 0.0 - 30.0 mg/g 52.3 High  19.3      Assessment & Plan:   1. Essential hypertension Blood pressure remains high. In light of his CKD, I think it reasonable to start an additional medication. He has a history of cough with lisinopril. I will add losartan to his regimen. I will reassess him in 1 month.  - losartan (COZAAR) 25 MG tablet; Take 1 tablet (25 mg total) by mouth daily.  Dispense: 90 tablet; Refill: 3  2. S/P transmetatarsal amputation of foot, left (HCC) Healing well. However, he is having ongoing weakness to his leg which impacts his ability to return to work. I will refer him to PT to work on strengthening and his gait. I recommend we extend his medical leave another 60 days. I discussed with Mr. Lefever about him looking into options for vocational rehabilitation to try and find work that would accommodate the need to stay off the left leg more.  - Ambulatory referral to Physical Therapy  3. Type 2 diabetes mellitus with diabetic neuropathy, with long-term current use of insulin (HCC) The A1c is improved, but not at goal. Continue to work with endocrinology. He will continue dapagliflozin Marcelline Deist) 5 mg daily, insulin degludec 30 units bid, and semaglutide (Ozempic) weekly.   4. Diabetic polyneuropathy associated with type 2 diabetes mellitus (HCC) As noted above. Remains at high risk for foot infection. I urged him to follow-up with podiatry.  - Ambulatory referral to Physical Therapy  5. Chronic kidney disease (CKD) stage G1/A2, glomerular filtration rate (GFR) equal to or greater than 90 mL/min/1.73 square meter and albuminuria creatinine ratio between 30-299 mg/g As above. We will try and get his BP closer to 120/70.   Return in about 4 weeks (around 12/23/2022) for Reassessment.   Loyola Mast, MD

## 2022-11-26 ENCOUNTER — Telehealth: Payer: Self-pay

## 2022-11-26 NOTE — Telephone Encounter (Signed)
Letter for work faxed to [employer] [facility] Attn:Jill [place] @ [phone].  Dm/cma

## 2022-11-27 ENCOUNTER — Telehealth: Payer: Self-pay | Admitting: Family Medicine

## 2022-11-27 NOTE — Telephone Encounter (Signed)
Type of forms received: pt wife(short time disability  Routed RJ:JOACZY  Paperwork received by : terrill   Individual made aware of 3-5 business day turn around (Y/N): yes Form completed and patient made aware of charges(Y/N):  yes Faxed to :   Form location:  dr Gena Fray box

## 2022-11-28 NOTE — Telephone Encounter (Signed)
Form placed on provider's desk. Dm/cma  

## 2022-12-01 NOTE — Telephone Encounter (Signed)
Form faxed to [facility] @ [phone].Dm/cma

## 2022-12-11 DIAGNOSIS — E1165 Type 2 diabetes mellitus with hyperglycemia: Secondary | ICD-10-CM | POA: Diagnosis not present

## 2022-12-11 DIAGNOSIS — Z794 Long term (current) use of insulin: Secondary | ICD-10-CM | POA: Diagnosis not present

## 2022-12-11 DIAGNOSIS — Z1330 Encounter for screening examination for mental health and behavioral disorders, unspecified: Secondary | ICD-10-CM | POA: Diagnosis not present

## 2022-12-16 ENCOUNTER — Ambulatory Visit: Payer: BC Managed Care – PPO | Attending: Family Medicine

## 2022-12-16 DIAGNOSIS — E1142 Type 2 diabetes mellitus with diabetic polyneuropathy: Secondary | ICD-10-CM | POA: Diagnosis not present

## 2022-12-16 DIAGNOSIS — R278 Other lack of coordination: Secondary | ICD-10-CM | POA: Diagnosis not present

## 2022-12-16 DIAGNOSIS — M79672 Pain in left foot: Secondary | ICD-10-CM | POA: Insufficient documentation

## 2022-12-16 DIAGNOSIS — Z89432 Acquired absence of left foot: Secondary | ICD-10-CM | POA: Insufficient documentation

## 2022-12-16 DIAGNOSIS — R262 Difficulty in walking, not elsewhere classified: Secondary | ICD-10-CM | POA: Insufficient documentation

## 2022-12-16 NOTE — Therapy (Signed)
OUTPATIENT PHYSICAL THERAPY LOWER EXTREMITY EVALUATION   Patient Name: Aaron Franco MRN: [id number] DOB:1971/06/07, 52 y.o., male Today's Date: 12/16/2022  END OF SESSION:  PT End of Session - 12/16/22 1719     Visit Number 1    Date for PT Re-Evaluation 02/17/23    Authorization Type BCBS    PT Start Time 1719    PT Stop Time 1800    PT Time Calculation (min) 41 min    Activity Tolerance Patient tolerated treatment well    Behavior During Therapy WFL for tasks assessed/performed             Past Medical History:  Diagnosis Date   CAD (coronary artery disease)    a. 05/2013 NSTEMI/PCI: mCFX 100%-> 3.0 mm  x 20 mm Promus premier DES, EF 65%;  01/2014 Cath: LM nl, LAD 20-30d, LCX patent distal stent, RCA 30-62m, EF 55-65%.   Diabetes mellitus    Essential hypertension    GERD (gastroesophageal reflux disease)    Gout    History of echocardiogram    a. 03/2011 Echo: EF 65%, mild LVH, no rwma, mildly dil LA.   Hyperlipidemia    MI, old    Microalbuminuria due to type 2 diabetes mellitus ([place]) 12/14/2017   Obesity    Past Surgical History:  Procedure Laterality Date   AMPUTATION Left 07/25/2022   Procedure: AMPUTATION LEFT FOOT THROUGH MIDDLE OF FOOT;  Surgeon: Newt Minion, MD;  Location: [place];  Service: Orthopedics;  Laterality: Left;   BICEPS TENDON REPAIR  Nov 13 2010   right   HAND SURGERY  1990   left   LEFT HEART CATH N/A 05/12/2013   Procedure: LEFT HEART CATH;  Surgeon: Sinclair Grooms, MD;  Location: [facility] CATH LAB;  Service: Cardiovascular;  Laterality: N/A;   LEFT HEART CATHETERIZATION WITH CORONARY ANGIOGRAM N/A 01/24/2014   Procedure: LEFT HEART CATHETERIZATION WITH CORONARY ANGIOGRAM;  Surgeon: Peter M Martinique, MD;  Location: [facility] CATH LAB;  Service: Cardiovascular;  Laterality: N/A;   PERCUTANEOUS CORONARY STENT INTERVENTION (PCI-S)  05/2013   mCx 3.22mm x 20 mm Promus P DES   PERCUTANEOUS CORONARY STENT INTERVENTION (PCI-S)  05/12/2013   Procedure:  PERCUTANEOUS CORONARY STENT INTERVENTION (PCI-S);  Surgeon: Sinclair Grooms, MD;  Location: [facility] CATH LAB;  Service: Cardiovascular;;   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 without macular edema, with long-term current use of insulin ([place]) 09/09/2022   S/P transmetatarsal amputation of foot, left ([place]) 08/18/2022   Cellulitis and abscess of foot 07/24/2022   Sepsis ([place]) 07/24/2022   Normocytic anemia 07/24/2022   Diabetic foot infection ([place]) 07/23/2022   Chronic venous insufficiency of lower extremity 01/23/2021   Type 2 diabetes mellitus with hyperglycemia, with long-term current use of insulin ([place]) 12/07/2020   Allergic rhinitis due to pollen 06/29/2020   Diabetic neuropathy ([place]) 03/13/2020   Erectile dysfunction due to arterial insufficiency 03/13/2020   History of gout 11/28/2019   Coronary artery disease involving native heart without angina pectoris 03/29/2019   Onychomycosis 12/01/2017   Obesity (BMI 30-39.9) 02/18/2016   Chest pain with moderate risk of acute coronary syndrome 09/17/2015   Type 2 diabetes mellitus with diabetic neuropathy, unspecified ([place]) 04/23/2015  Thrush 04/23/2015   Type II diabetes mellitus with renal manifestations ([place]) 02/16/2014   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   Hammer toe, acquired 02/16/2014   CAD S/P percutaneous coronary angioplasty -  01/24/2014   Cardiac murmur 02/21/2011   Bilateral lower extremity edema 12/24/2010   Vitamin B12 deficiency 07/19/2010   Anemia due to vitamin B12 deficiency 10/31/2009   Trigger finger 10/16/2009   Dyslipidemia 03/01/2007   Gout 03/01/2007   Essential hypertension 03/01/2007   Gastroesophageal reflux  disease 03/01/2007    PCP: Arlester Marker  REFERRING PROVIDER: Arlester Marker  REFERRING DIAG: Z61.096, E11.42- s/p transmetatarsal amputation of foot, left and polyneuropathy with type 2 DM  THERAPY DIAG:  S/P transmetatarsal amputation of foot, left (HCC)  Pain in left foot  Difficulty in walking, not elsewhere classified  Other lack of coordination  Rationale for Evaluation and Treatment: Rehabilitation  ONSET DATE: 07/25/22  SUBJECTIVE:   SUBJECTIVE STATEMENT: I am maintaining, trying to hold it together. I want to make sure my balance is good. I have a carbon plate and a fitter.   PERTINENT HISTORY: DM, obesity, CKD  PAIN:  Are you having pain? Yes: NPRS scale: 6/10 Pain location: L foot and leg swells Pain description: phantom pain, sometimes feels like pressure Aggravating factors: a lot of walking Relieving factors: nothing really  PRECAUTIONS: None  WEIGHT BEARING RESTRICTIONS: No  FALLS:  Has patient fallen in last 6 months? 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  PLOF: Independent  PATIENT GOALS: work on my balance and walking better  OBJECTIVE:   COGNITION: Overall cognitive status: Within functional limits for tasks assessed     SENSATION: WFL  MUSCLE LENGTH: Hamstrings: moderate tightness bilaterally  POSTURE: rounded shoulders  LOWER EXTREMITY ROM: WNL  LOWER EXTREMITY MMT: grossly 5/5   FUNCTIONAL TESTS:  5 times sit to stand: 12.49s Berg Balance Scale: 54/56 Functional gait assessment: 25/30  GAIT: Distance walked: in clinic distances Assistive device utilized: None Level of assistance: Complete Independence Comments: decreased stance time on L foot, poor foot clearance, decreased toe off  due to amputation   TODAY'S TREATMENT:                                                                                                                              DATE: 12/16/22-EVAL    PATIENT EDUCATION:  Education details: HEP and POC Person educated: Patient Education method: Explanation Education comprehension: verbalized understanding  HOME EXERCISE PROGRAM: Access Code: [email or web address] URL: [email or web address] Date: 12/16/2022 Prepared by: Andris Baumann  Exercises - Tandem Stance  - 1 x daily - 7 x weekly - 10 reps - 15-30 hold - Single Leg Stance  - 1 x daily - 7 x weekly - 10 reps -  15-30 hold - Side Stepping with Resistance at Ankles  - 1 x daily - 7 x weekly - 2 sets - 10 reps  ASSESSMENT:  CLINICAL IMPRESSION: Patient is a 52 y.o. male who was seen today for physical therapy evaluation and treatment for transmetatarsal L foot amputation. Patient presents with good overall strength and ROM, his gait pattern does not have significant findings. He is wearing a carbon plate and toe fitter which help with his walking. Patient has some mild impairments with his balance as found with BERG and FGA. Patient will benefit from skilled PT to address his pain and balance deficits to be able to walk and return to work without difficulty.  OBJECTIVE IMPAIRMENTS: Abnormal gait, decreased balance, difficulty walking, and pain.   REHAB POTENTIAL: Good  CLINICAL DECISION MAKING: Stable/uncomplicated  EVALUATION COMPLEXITY: Low   GOALS: GOALS: Goals reviewed with patient? Yes  SHORT TERM GOALS: Target date: 01/13/23  Patient will be independent with initial HEP. Goal status: INITIAL  2.   Patient will demonstrate SLS at least 10s bilaterally Baseline: 5s at best Goal status: INITIAL    LONG TERM GOALS: Target date: 02/17/23  Patient will be independent with advanced/ongoing HEP to improve outcomes and carryover.  Goal status: INITIAL  2.  Patient will be able to  take 10 tandem steps  Baseline: 5-7 steps at best Goal status: INITIAL  3.  Patient will demonstrate SLS on foam pad 5s or better Baseline: 2s at best Goal status: INITIAL   4.  Patient report decrease in foot pain by 50% Baseline: 6/10 Goal status: INITIAL  PLAN:  PT FREQUENCY: 1x/week  PT DURATION: 8 weeks  PLANNED INTERVENTIONS: Therapeutic exercises, Therapeutic activity, Neuromuscular re-education, Balance training, Gait training, Patient/Family education, Self Care, Joint mobilization, Stair training, Cryotherapy, Moist heat, and Manual therapy  PLAN FOR NEXT SESSION: balance training, gym activities working on hip/knee/ankle strengthening as tolerated    [employer], PT 12/16/2022, 6:11 PM

## 2022-12-17 ENCOUNTER — Ambulatory Visit (INDEPENDENT_AMBULATORY_CARE_PROVIDER_SITE_OTHER): Payer: BC Managed Care – PPO | Admitting: Podiatry

## 2022-12-17 ENCOUNTER — Ambulatory Visit: Payer: BC Managed Care – PPO | Admitting: Internal Medicine

## 2022-12-17 DIAGNOSIS — B353 Tinea pedis: Secondary | ICD-10-CM

## 2022-12-17 DIAGNOSIS — B351 Tinea unguium: Secondary | ICD-10-CM

## 2022-12-17 DIAGNOSIS — E1149 Type 2 diabetes mellitus with other diabetic neurological complication: Secondary | ICD-10-CM

## 2022-12-17 DIAGNOSIS — E1140 Type 2 diabetes mellitus with diabetic neuropathy, unspecified: Secondary | ICD-10-CM

## 2022-12-17 DIAGNOSIS — Z794 Long term (current) use of insulin: Secondary | ICD-10-CM

## 2022-12-17 MED ORDER — KETOCONAZOLE 2 % EX CREA: 1.0000 | TOPICAL_CREAM | Freq: Every day | CUTANEOUS | 2 refills | Status: AC

## 2022-12-21 NOTE — Progress Notes (Signed)
  Subjective:  Patient ID: Aaron Franco, male    DOB: 10-15-1971,  MRN: [id number]  Chief Complaint  Patient presents with   Nail Problem    Thick painful toenails, 3 month follow up    52 y.o. male presents with the above complaint. History confirmed with patient.  He has a previous partial foot amputation on the left foot, his nails are thickened elongated and painful on the right foot.  Unable to cut them.  His diabetes is getting better working on getting his A1c down.  Notes dry skin on his foot.  Objective:  Physical Exam: warm, good capillary refill, no trophic changes or ulcerative lesions, normal DP and PT pulses, tinea pedis bilateral moccasin distribution, and abnormal sensory exam with diffuse neuropathy. Left Foot:  Well-healed transmetatarsal amputation Right Foot: dystrophic yellowed discolored nail plates with subungual debris   Assessment:   1. Tinea pedis of both feet   2. Onychomycosis   3. Diabetic neuropathy with neurologic complication ([name])   4. Type 2 diabetes mellitus with diabetic neuropathy, with long-term current use of insulin ([name])      Plan:  Patient was evaluated and treated and all questions answered.  Patient educated on diabetes. Discussed proper diabetic foot care and discussed risks and complications of disease. Educated patient in depth on reasons to return to the office immediately should he/she discover anything concerning or new on the feet. All questions answered. Discussed proper shoes as well.   Discussed the etiology and treatment options for the condition in detail with the patient. Educated patient on the topical and oral treatment options for mycotic nails. Recommended debridement of the nails today. Sharp and mechanical debridement performed of all painful and mycotic nails today. Nails debrided in length and thickness using a nail nipper to level of comfort. Discussed treatment options including appropriate shoe gear. Follow up as  needed for painful nails.  Discussed the etiology and treatment options for tinea pedis.  Discussed topical and oral treatment.  Recommended topical treatment with 2% ketoconazole cream.  This was sent to the patient's pharmacy.  Also discussed appropriate foot hygiene, use of antifungal spray such as Tinactin in shoes, as well as cleaning her foot surfaces such as showers and bathroom floors with bleach.   Return in about 3 months (around 03/17/2023) for at risk diabetic foot care.

## 2022-12-23 ENCOUNTER — Encounter: Payer: Self-pay | Admitting: Family Medicine

## 2022-12-23 ENCOUNTER — Ambulatory Visit (INDEPENDENT_AMBULATORY_CARE_PROVIDER_SITE_OTHER): Payer: BC Managed Care – PPO | Admitting: Family Medicine

## 2022-12-23 VITALS — BP 122/70 | HR 89 | Temp 99.0°F | Ht 72.0 in | Wt 296.6 lb

## 2022-12-23 DIAGNOSIS — N181 Chronic kidney disease, stage 1: Secondary | ICD-10-CM

## 2022-12-23 DIAGNOSIS — Z794 Long term (current) use of insulin: Secondary | ICD-10-CM

## 2022-12-23 DIAGNOSIS — Z89432 Acquired absence of left foot: Secondary | ICD-10-CM

## 2022-12-23 DIAGNOSIS — E1140 Type 2 diabetes mellitus with diabetic neuropathy, unspecified: Secondary | ICD-10-CM

## 2022-12-23 DIAGNOSIS — I10 Essential (primary) hypertension: Secondary | ICD-10-CM | POA: Diagnosis not present

## 2022-12-23 DIAGNOSIS — E1142 Type 2 diabetes mellitus with diabetic polyneuropathy: Secondary | ICD-10-CM | POA: Diagnosis not present

## 2022-12-23 NOTE — Assessment & Plan Note (Signed)
I support Aaron Franco looking into options for vocational rehabilitation. He also plans to look into permanent disability, which is reasonable.

## 2022-12-23 NOTE — Progress Notes (Signed)
Aaron Franco Dept: [phone] Dept Fax: [phone]  Chronic Care Office Visit  Subjective:    Patient ID: Aaron Franco, male    DOB: [birth year], 52 y.o..   MRN: [id number]  Chief Complaint  Patient presents with   Medical Management of Chronic Issues    4 week f/u.  No concerns.     History of Present Illness:  Patient is in today for reassessment of chronic medical issues.  [name] had a left transmetatarsal amputation secondary to a diabetic foot infection in mid-Sept. He has been progressing in his healing. He was recently seen by podiatry. He is recognizing and reconciling himself tot the idea that he may not be able to return to his prior job, which required him to walk around a great deal. He notes he does have an upcoming assessment with Vocational Rehabilitation in early March.   [name] has a history of hypertension. He is on amlodipine 10 mg daily, nebivolol 5 mg daily, and losartan 25 mg daily (added at his last visit).   [name] has had issues with uncontrolled diabetes. He is followed by Prentice Docker, NP (endocrinology). He continues to be on dapagliflozin Wilder Glade) 5 mg daily, insulin degludec 30 units bid, and semaglutide (Ozempic) weekly. Recently Ms. Pang recommended he increase his dose to 1 mg weekly of the semaglutide. He has not picked up the higher dose yet.  Past Medical History: 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 without macular edema, with long-term current use of insulin ([place]) 09/09/2022   Uncontrolled type 2 diabetes mellitus with hyperglycemia, with  long-term current use of insulin ([place]) 09/09/2022   S/P transmetatarsal amputation of foot, left ([place]) 08/18/2022   Cellulitis and abscess of foot 07/24/2022   Sepsis ([place]) 07/24/2022   Normocytic anemia 07/24/2022   Diabetic foot infection ([place]) 07/23/2022   Chronic venous insufficiency of lower extremity 01/23/2021   Allergic rhinitis due to pollen 06/29/2020   Diabetic neuropathy ([place]) 03/13/2020   Erectile dysfunction due to arterial insufficiency 03/13/2020   History of gout 11/28/2019   Coronary artery disease involving native heart without angina pectoris 03/29/2019   Onychomycosis 12/01/2017   Obesity (BMI 30-39.9) 02/18/2016   Chest pain with moderate risk of acute coronary syndrome 09/17/2015   Type 2 diabetes mellitus with diabetic neuropathy, unspecified ([place]) 04/23/2015   Thrush 04/23/2015   Type II diabetes mellitus with renal manifestations ([place]) 02/16/2014   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   Hammer toe, acquired 02/16/2014   CAD S/P percutaneous coronary angioplasty -  01/24/2014   Cardiac murmur 02/21/2011   Bilateral lower extremity edema 12/24/2010   Vitamin B12 deficiency 07/19/2010   Anemia due to vitamin B12 deficiency 10/31/2009   Trigger finger 10/16/2009   Dyslipidemia 03/01/2007   Gout 03/01/2007   Essential hypertension 03/01/2007   Gastroesophageal reflux disease 03/01/2007   Past Surgical History:  Procedure Laterality Date   AMPUTATION Left 07/25/2022   Procedure: AMPUTATION LEFT FOOT THROUGH MIDDLE OF FOOT;  Surgeon: Newt Minion, MD;  Location: [facility];  Service: Orthopedics;  Laterality: Left;   BICEPS TENDON REPAIR  Nov 13 2010   right   HAND SURGERY  1990   left   LEFT HEART CATH N/A 05/12/2013   Procedure: LEFT HEART CATH;  Surgeon: Sinclair Grooms, MD;  Location: [facility] CATH LAB;  Service: Cardiovascular;  Laterality: N/A;   LEFT HEART CATHETERIZATION WITH CORONARY ANGIOGRAM N/A 01/24/2014   Procedure: LEFT HEART CATHETERIZATION WITH  CORONARY ANGIOGRAM;  Surgeon: Peter M Martinique, MD;  Location: [facility] CATH LAB;  Service: Cardiovascular;  Laterality: N/A;   PERCUTANEOUS CORONARY STENT INTERVENTION (PCI-S)  05/2013   mCx 3.59m x 20 mm Promus P DES   PERCUTANEOUS CORONARY STENT INTERVENTION (PCI-S)  05/12/2013   Procedure: PERCUTANEOUS CORONARY STENT INTERVENTION (PCI-S);  Surgeon: HSinclair Grooms MD;  [facility];  Service: Cardiovascular;;   Family History  Problem Relation Age of Onset   Heart attack Father        deceased   Diabetes Father    Congestive Heart Failure Father    Stroke Mother    Pancreatic cancer Maternal Aunt    Colon cancer Neg Hx    Outpatient Medications Prior to Visit  Medication Sig Dispense Refill   acetaminophen (TYLENOL) 325 MG tablet Take 2 tablets (650 mg total) by mouth every 4 (four) hours as needed for headache or mild pain.     allopurinol (ZYLOPRIM) 100 MG tablet TAKE 1 TABLET(100 MG) BY MOUTH DAILY (Patient taking differently: Take 100 mg by mouth daily as needed (as directed- for gout).) 90 tablet 0   alum & mag hydroxide-simeth (MAALOX/MYLANTA) 200-200-20 MG/5ML suspension Take 15-30 mLs by mouth every 2 (two) hours as needed for indigestion. 355 mL 0   amLODipine (NORVASC) 10 MG tablet NEW PRESCRIPTION REQUEST: TAKE ONE TABLET BY MOUTH DAILY 90 tablet 3   aspirin 81 MG chewable tablet Chew 1 tablet (81 mg total) by mouth daily. (Patient taking differently: Chew 81 mg by mouth daily at 12 noon.)     bisacodyl (DULCOLAX) 5 MG EC tablet Take 1 tablet (5 mg total) by mouth daily as needed for moderate constipation. 30 tablet 0   dapagliflozin propanediol (FARXIGA) 5 MG TABS tablet NEW PRESCRIPTION REQUEST: TAKE ONE TABLET BY MOUTH DAILY 90 tablet 3   docusate sodium (COLACE) 100 MG capsule Take 1 capsule (100 mg total) by mouth daily. 10 capsule 0   Evolocumab (REPATHA SURECLICK) 1XX123456MG/ML SOAJ Inject 140 mg into the skin every 14 (fourteen) days. 6 mL 3   fluticasone (FLONASE) 50  MCG/ACT nasal spray Place 1 spray into both nostrils daily as needed for allergies or rhinitis. 18.2 mL 6   Glycerin-Polysorbate 80 (REFRESH DRY EYE THERAPY OP) Place 1 drop into both eyes 3 (three) times daily as needed (for dryness).     hydrocortisone (ANUSOL-HC) 25 MG suppository Place 1 suppository (25 mg total) rectally 2 (two) times daily. 12 suppository 0   insulin degludec (TRESIBA FLEXTOUCH) 100 UNIT/ML FlexTouch Pen Inject 30 Units into the skin 2 (two) times daily. 45 mL 5   Insulin Pen Needle 31G X 8 MM MISC 1 Device by Does not apply route in the morning, at noon, in the evening, and at bedtime. 400 each 2   ketoconazole (NIZORAL) 2 % cream Apply 1 Application topically daily. 60 g 2   losartan (COZAAR) 25 MG tablet Take 1 tablet (25 mg total) by mouth daily. 90 tablet 3   nebivolol (BYSTOLIC) 5 MG tablet NEW PRESCRIPTION REQUEST: TAKE ONE TABLET BY MOUTH DAILY 90 tablet 3   nitroGLYCERIN (NITROSTAT) 0.4 MG SL tablet DISSOLVE 1 TABLET  UNDER THE TONGUE EVERY 5 MINUTES AS NEEDED FOR CHEST PAIN. DO NOT EXCEED A TOTAL OF 3 DOSES IN 15 MINUTES. 25 tablet 2   oxyCODONE (OXY IR/ROXICODONE) 5 MG immediate release tablet Take 1-2 tablets (5-10 mg total) by mouth every 4 (four) hours as needed for moderate pain (pain score 4-6). 30 tablet 0   pantoprazole (PROTONIX) 40 MG tablet Take 1 tablet (40 mg total) by mouth daily. 90 tablet 3   polyethylene glycol (MIRALAX / GLYCOLAX) 17 g packet Take 17 g by mouth 2 (two) times daily. 14 each 0   rosuvastatin (CRESTOR) 40 MG tablet NEW PRESCRIPTION REQUEST: TAKE ONE TABLET BY MOUTH DAILY 90 tablet 3   Semaglutide, 1 MG/DOSE, 4 MG/3ML SOPN Inject 1 mg into the skin once a week.     tadalafil (CIALIS) 20 MG tablet Take 20 mg by mouth daily as needed.     vitamin B-12 (CYANOCOBALAMIN) 500 MCG tablet NEW PRESCRIPTION REQUEST: TAKE ONE TABLET BY MOUTH DAILY 90 tablet 3   OZEMPIC, 0.25 OR 0.5 MG/DOSE, 2 MG/3ML SOPN Inject into the skin.     No  facility-administered medications prior to visit.   Allergies  Allergen Reactions   Influenza Vaccines Anaphylaxis    Reaction June 2017   Influenza Virus Vaccine H5n1 Anaphylaxis   Fenofibrate Other (See Comments)    Hot flashes   Lisinopril Cough   Metformin Other (See Comments)    Chest discomfort   Prednisone Other (See Comments)    Mood swings   Augmentin [Amoxicillin-Pot Clavulanate] Rash    07/2022 tolerated cefazolin   Latex Rash   Objective:   Today's Vitals   12/23/22 1552  BP: 122/70  Pulse: 89  Temp: 99 F (37.2 C)  TempSrc: Temporal  SpO2: 98%  Weight: 296 lb 9.6 oz (134.5 kg)  Height: 6' (1.829 m)   Body mass index is 40.23 kg/m.   General: Well developed, well nourished. No acute distress. Extremities: Left foot amputation scar is mostly healed at this point. Psych: Alert and oriented. Normal mood and affect.  Health Maintenance Due  Topic Date Due   OPHTHALMOLOGY EXAM  Never done   Hepatitis C Screening  Never done   COLONOSCOPY (Pts 45-44yr Insurance coverage will need to be confirmed)  Never done   Zoster Vaccines- Shingrix (1 of 2) Never done   DTaP/Tdap/Td (4 - Td or Tdap) 10/10/2022     Assessment & Plan:   Problem List Items Addressed This Visit       Cardiovascular and Mediastinum   Essential hypertension - Primary (Chronic)    Blood pressure is in much better control. Continue amlodipine 10 mg daily, nebivolol 5 mg daily, and losartan 25 mg daily.         Endocrine   Type 2 diabetes mellitus with diabetic neuropathy, unspecified ([place]    Continue to follow with Ms. PMinna Antis Continue apagliflozin (Farxiga) 5 mg daily, insulin degludec 30 units bid, and semaglutide (Ozempic) 1 mg weekly (urged him to start this).      Relevant Medications   Semaglutide, 1 MG/DOSE, 4 MG/3ML SOPN   Diabetic neuropathy (HCC)   Relevant Medications   Semaglutide, 1 MG/DOSE, 4 MG/3ML SOPN     Genitourinary   Chronic kidney disease (CKD) stage G1/A2,  glomerular filtration rate (GFR) equal to or greater than 90 mL/min/1.73 square meter and albuminuria creatinine ratio between 30-299 mg/g     Other   S/P transmetatarsal amputation of foot, left ([place]  I support Mr. Caruth looking into options for vocational rehabilitation. He also plans to look into permanent disability, which is reasonable.       Return in about 6 weeks (around 02/03/2023) for Reassessment.   Haydee Salter, MD

## 2022-12-23 NOTE — Assessment & Plan Note (Signed)
Blood pressure is in much better control. Continue amlodipine 10 mg daily, nebivolol 5 mg daily, and losartan 25 mg daily.

## 2022-12-23 NOTE — Assessment & Plan Note (Signed)
Continue to follow with Ms. Minna Antis. Continue apagliflozin (Farxiga) 5 mg daily, insulin degludec 30 units bid, and semaglutide (Ozempic) 1 mg weekly (urged him to start this).

## 2022-12-24 ENCOUNTER — Telehealth: Payer: Self-pay

## 2022-12-24 NOTE — Telephone Encounter (Signed)
Forms faxed to both [facility] @ [phone] and to [facility] @ [phone].  Dm/cma

## 2022-12-26 LAB — COLOGUARD: COLOGUARD: NEGATIVE

## 2022-12-30 ENCOUNTER — Encounter: Payer: Self-pay | Admitting: Family Medicine

## 2022-12-30 DIAGNOSIS — I2510 Atherosclerotic heart disease of native coronary artery without angina pectoris: Secondary | ICD-10-CM

## 2022-12-30 LAB — HM DIABETES EYE EXAM

## 2022-12-30 MED ORDER — NEBIVOLOL HCL 5 MG PO TABS: ORAL_TABLET | ORAL | 3 refills | Status: DC

## 2022-12-31 ENCOUNTER — Ambulatory Visit: Payer: BC Managed Care – PPO | Admitting: Physical Therapy

## 2023-01-07 ENCOUNTER — Ambulatory Visit: Payer: BC Managed Care – PPO | Admitting: Physical Therapy

## 2023-01-07 ENCOUNTER — Encounter: Payer: Self-pay | Admitting: Physical Therapy

## 2023-01-07 DIAGNOSIS — Z89432 Acquired absence of left foot: Secondary | ICD-10-CM

## 2023-01-07 DIAGNOSIS — R278 Other lack of coordination: Secondary | ICD-10-CM | POA: Diagnosis not present

## 2023-01-07 DIAGNOSIS — M79672 Pain in left foot: Secondary | ICD-10-CM | POA: Diagnosis not present

## 2023-01-07 DIAGNOSIS — R262 Difficulty in walking, not elsewhere classified: Secondary | ICD-10-CM | POA: Diagnosis not present

## 2023-01-07 DIAGNOSIS — E1142 Type 2 diabetes mellitus with diabetic polyneuropathy: Secondary | ICD-10-CM | POA: Diagnosis not present

## 2023-01-07 NOTE — Therapy (Signed)
OUTPATIENT PHYSICAL THERAPY LOWER EXTREMITY EVALUATION   Patient Name: Aaron Franco MRN: [id number] DOB:1971-07-30, 52 y.o., male Today's Date: 01/07/2023  END OF SESSION:  PT End of Session - 01/07/23 1719     Visit Number 2    Date for PT Re-Evaluation 02/17/23    PT Start Time 1717    PT Stop Time 1757    PT Time Calculation (min) 40 min    Activity Tolerance Patient tolerated treatment well    Behavior During Therapy [facility] for tasks assessed/performed              Past Medical History:  Diagnosis Date   CAD (coronary artery disease)    a. 05/2013 NSTEMI/PCI: mCFX 100%-> 3.0 mm  x 20 mm Promus premier DES, EF 65%;  01/2014 Cath: LM nl, LAD 20-30d, LCX patent distal stent, RCA 30-17m EF 55-65%.   Diabetes mellitus    Essential hypertension    GERD (gastroesophageal reflux disease)    Gout    History of echocardiogram    a. 03/2011 Echo: EF 65%, mild LVH, no rwma, mildly dil LA.   Hyperlipidemia    MI, old    Microalbuminuria due to type 2 diabetes mellitus ([place] 12/14/2017   Obesity    Past Surgical History:  Procedure Laterality Date   AMPUTATION Left 07/25/2022   Procedure: AMPUTATION LEFT FOOT THROUGH MIDDLE OF FOOT;  Surgeon: DNewt Minion MD;  [place]  Service: Orthopedics;  Laterality: Left;   BICEPS TENDON REPAIR  Nov 13 2010   right   HAND SURGERY  1990   left   LEFT HEART CATH N/A 05/12/2013   Procedure: LEFT HEART CATH;  Surgeon: HSinclair Grooms MD;  [facility];  Service: Cardiovascular;  Laterality: N/A;   LEFT HEART CATHETERIZATION WITH CORONARY ANGIOGRAM N/A 01/24/2014   Procedure: LEFT HEART CATHETERIZATION WITH CORONARY ANGIOGRAM;  Surgeon: Peter M JMartinique MD;  [facility];  Service: Cardiovascular;  Laterality: N/A;   PERCUTANEOUS CORONARY STENT INTERVENTION (PCI-S)  05/2013   mCx 3.066mx 20 mm Promus P DES   PERCUTANEOUS CORONARY STENT INTERVENTION (PCI-S)  05/12/2013   Procedure: PERCUTANEOUS CORONARY STENT  INTERVENTION (PCI-S);  Surgeon: HeSinclair GroomsMD;  [facility];  Service: Cardiovascular;;   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 without macular edema, with long-term current use of insulin (HCGene [place]10/31/2023   Uncontrolled type 2 diabetes mellitus with hyperglycemia, with long-term current use of insulin (HCLodge[place]10/31/2023   S/P transmetatarsal amputation of foot, left (HCElm[place]10/07/2022   Cellulitis and abscess of foot 07/24/2022   Sepsis (HCPige[place]09/14/2023   Normocytic anemia 07/24/2022   Diabetic foot infection (HCMarm[place]09/13/2023   Chronic venous insufficiency of lower extremity 01/23/2021   Allergic rhinitis due to pollen 06/29/2020   Diabetic neuropathy (HC[place]05/02/2020   Erectile dysfunction due to arterial insufficiency 03/13/2020   History of gout 11/28/2019   Coronary artery disease involving native heart without angina pectoris 03/29/2019   Onychomycosis 12/01/2017   Obesity (BMI 30-39.9) 02/18/2016   Chest pain with moderate risk of acute coronary syndrome 09/17/2015   Type 2 diabetes mellitus with diabetic neuropathy, unspecified (HCPione[place]06/13/2016   Thrush 04/23/2015  Type II diabetes mellitus with renal manifestations ([place]) 02/16/2014   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   Hammer toe, acquired 02/16/2014   CAD S/P percutaneous coronary angioplasty -  01/24/2014   Cardiac murmur 02/21/2011   Bilateral lower extremity edema 12/24/2010   Vitamin B12 deficiency 07/19/2010   Anemia due to vitamin B12 deficiency 10/31/2009   Trigger finger 10/16/2009   Dyslipidemia 03/01/2007   Gout 03/01/2007   Essential hypertension 03/01/2007   Gastroesophageal reflux disease 03/01/2007     PCP: Arlester Marker  REFERRING PROVIDER: Arlester Marker  REFERRING DIAG: [id number], E11.42- s/p transmetatarsal amputation of foot, left and polyneuropathy with type 2 DM  THERAPY DIAG:  S/P transmetatarsal amputation of foot, left (HCC)  Pain in left foot  Difficulty in walking, not elsewhere classified  Other lack of coordination  Rationale for Evaluation and Treatment: Rehabilitation  ONSET DATE: 07/25/22  SUBJECTIVE:   SUBJECTIVE STATEMENT: I am maintaining, trying to hold it together. I want to make sure my balance is good. I have a carbon plate and a fitter.   PERTINENT HISTORY: DM, obesity, CKD  PAIN:  Are you having pain? Yes: NPRS scale: 6/10 Pain location: L foot and leg swells Pain description: phantom pain, sometimes feels like pressure Aggravating factors: a lot of walking Relieving factors: nothing really  PRECAUTIONS: None  WEIGHT BEARING RESTRICTIONS: No  FALLS:  Has patient fallen in last 6 months? 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  PLOF: Independent  PATIENT GOALS: work on my balance and walking better  OBJECTIVE:   COGNITION: Overall cognitive status: Within functional limits for tasks assessed     SENSATION: WFL  MUSCLE LENGTH: Hamstrings: moderate tightness bilaterally  POSTURE: rounded shoulders  LOWER EXTREMITY ROM: WNL  LOWER EXTREMITY MMT: grossly 5/5   FUNCTIONAL TESTS:  5 times sit to stand: 12.49s Berg Balance Scale: 54/56 Functional gait assessment: 25/30  GAIT: Distance walked: in clinic distances Assistive device utilized: None Level of assistance: Complete Independence Comments: decreased stance time on L foot, poor foot clearance, decreased toe off due to  amputation   TODAY'S TREATMENT:                                                                                                                              DATE:  12/16/22-EVAL  01/07/23 Supine trunk stabilization-bridge over ball, repeat with Bhip IR, repeat with small roll side to side 10 reps each Sit to stand from elevated mat with feet on Air Ex pad x 10 Standing weight shifts on Air ex x 10 each direction, tended to shift hips only when moving ant/post, required VC to perform an actual weight shift Paloff press with 10#, 2 x 10 each direction. Hip abd and ext against  10# resistance x 10 each leg. B side stepping on Air ex beam 4 x each direction. 4 square stepping clockwise, CCW, then changing direction on command of therapist. No unsteadiness noted.   PATIENT EDUCATION:  Education details: HEP and POC Person educated: Patient Education method: Explanation Education comprehension: verbalized understanding  HOME EXERCISE PROGRAM: Access Code: [email or web address] URL: [email or web address] Date: 12/16/2022 Prepared by: Andris Baumann  Exercises - Tandem Stance  - 1 x daily - 7 x weekly - 10 reps - 15-30 hold - Single Leg Stance  - 1 x daily - 7 x weekly - 10 reps - 15-30 hold - Side Stepping with Resistance at Ankles  - 1 x daily - 7 x weekly - 2 sets - 10 reps  ASSESSMENT:  CLINICAL IMPRESSION: Patient reports no issues. He is looking for a good pair of shoes in which he will feel more stable. Treatment addressed trunk stability, LE strength, and balance training. He required occasional VC for posture.  OBJECTIVE IMPAIRMENTS: Abnormal gait, decreased balance, difficulty walking, and pain.   REHAB POTENTIAL: Good  CLINICAL DECISION MAKING: Stable/uncomplicated  EVALUATION COMPLEXITY: Low   GOALS: GOALS: Goals reviewed with patient? Yes  SHORT TERM GOALS: Target date: 01/13/23  Patient will be independent with initial HEP. Goal status: Met 01/07/23  2.   Patient  will demonstrate SLS at least 10s bilaterally Baseline: 5s at best Goal status: INITIAL    LONG TERM GOALS: Target date: 02/17/23  Patient will be independent with advanced/ongoing HEP to improve outcomes and carryover.  Goal status: INITIAL  2.  Patient will be able to take 10 tandem steps  Baseline: 5-7 steps at best Goal status: INITIAL  3.  Patient will demonstrate SLS on foam pad 5s or better Baseline: 2s at best Goal status: INITIAL   4.  Patient report decrease in foot pain by 50% Baseline: 6/10 Goal status: INITIAL  PLAN:  PT FREQUENCY: 1x/week  PT DURATION: 8 weeks  PLANNED INTERVENTIONS: Therapeutic exercises, Therapeutic activity, Neuromuscular re-education, Balance training, Gait training, Patient/Family education, Self Care, Joint mobilization, Stair training, Cryotherapy, Moist heat, and Manual therapy  PLAN FOR NEXT SESSION: balance training, gym activities working on hip/knee/ankle strengthening as tolerated    Marcelina Morel, DPT 01/07/2023, 5:59 PM

## 2023-01-12 ENCOUNTER — Ambulatory Visit: Payer: BC Managed Care – PPO

## 2023-01-12 ENCOUNTER — Encounter: Payer: Self-pay | Admitting: Family Medicine

## 2023-01-13 ENCOUNTER — Other Ambulatory Visit: Payer: Self-pay | Admitting: Family Medicine

## 2023-01-19 ENCOUNTER — Other Ambulatory Visit (HOSPITAL_COMMUNITY): Payer: Self-pay

## 2023-01-21 ENCOUNTER — Ambulatory Visit: Payer: BC Managed Care – PPO | Attending: Family Medicine

## 2023-01-27 ENCOUNTER — Encounter: Payer: Self-pay | Admitting: Family Medicine

## 2023-02-03 ENCOUNTER — Encounter: Payer: Self-pay | Admitting: Family Medicine

## 2023-02-03 ENCOUNTER — Ambulatory Visit (INDEPENDENT_AMBULATORY_CARE_PROVIDER_SITE_OTHER): Payer: BC Managed Care – PPO | Admitting: Family Medicine

## 2023-02-03 VITALS — BP 136/82 | HR 96 | Temp 97.8°F | Ht 72.0 in | Wt 300.6 lb

## 2023-02-03 DIAGNOSIS — I10 Essential (primary) hypertension: Secondary | ICD-10-CM | POA: Diagnosis not present

## 2023-02-03 DIAGNOSIS — N181 Chronic kidney disease, stage 1: Secondary | ICD-10-CM

## 2023-02-03 DIAGNOSIS — Z89432 Acquired absence of left foot: Secondary | ICD-10-CM

## 2023-02-03 DIAGNOSIS — Z794 Long term (current) use of insulin: Secondary | ICD-10-CM

## 2023-02-03 DIAGNOSIS — Z1159 Encounter for screening for other viral diseases: Secondary | ICD-10-CM

## 2023-02-03 DIAGNOSIS — E1122 Type 2 diabetes mellitus with diabetic chronic kidney disease: Secondary | ICD-10-CM | POA: Diagnosis not present

## 2023-02-03 MED ORDER — LOSARTAN POTASSIUM 50 MG PO TABS: 50.0000 mg | ORAL_TABLET | Freq: Every day | ORAL | 3 refills | Status: DC

## 2023-02-03 NOTE — Assessment & Plan Note (Signed)
Reviewed renal labs and implications of CKD. He is currently on a SGLT2i and ARB. Reviewed avoidance of NSAIDs.

## 2023-02-03 NOTE — Assessment & Plan Note (Signed)
Blood pressure is ia little higher today. Continue amlodipine 10 mg daily and nebivolol 5 mg daily. I iwll increase the losartan to 50 mg daily.

## 2023-02-03 NOTE — Assessment & Plan Note (Signed)
Continue to follow with Aaron Franco. Continue dapagliflozin (Farxiga) 5 mg daily, insulin degludec 30 units bid, and semaglutide (Ozempic) 1 mg weekly. I will check his A1c and blood sugar.

## 2023-02-03 NOTE — Progress Notes (Signed)
[facility] [address] [place] [address] Dept: [phone] Dept Fax: [phone]  Chronic Care Office Visit  Subjective:    Patient ID: Aaron Franco, male    DOB: May 29, 1971, 52 y.o..   MRN: [id number]  Chief Complaint  Patient presents with   Medical Management of Chronic Issues    6 week f/u.  C/o having redness on LT lower leg.     History of Present Illness:  Patient is in today for reassessment of chronic medical issues.  Aaron Franco had a left transmetatarsal amputation secondary to a diabetic foot infection in mid-Sept. He has been progressing in his healing. He has had some recent concerns about redness to the left shin area. He has some chronic edema of the left lower leg. He used compression stockings in the past, but has not returned to this since his amputation. He had a recent assessment with Vocational Rehabilitation and is awaiting their advice/assistance concerning potential job retraining for work that will not involve prolonged standing.   Aaron Franco has a history of hypertension. He is on amlodipine 10 mg daily, nebivolol 5 mg daily, and losartan 25 mg daily. His kidney studies shows he has G1A2 CKD.   Aaron Franco has had issues with uncontrolled diabetes. He is followed by Prentice Docker, NP (endocrinology). He continues to be on dapagliflozin Wilder Glade) 5 mg daily, insulin degludec 30 units bid, and semaglutide (Ozempic) 1 mg weekly. He notes his mornign blood sugars have recently been around 130, which is the best he has ever seen.  Past Medical History: 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 without macular  edema, with long-term current use of insulin ([place]) 09/09/2022   Uncontrolled type 2 diabetes mellitus with hyperglycemia, with long-term current use of insulin ([place]) 09/09/2022   S/P transmetatarsal amputation of foot, left ([place]) 08/18/2022   Sepsis ([place]) 07/24/2022   Normocytic anemia 07/24/2022   Diabetic foot infection ([place]) 07/23/2022   Chronic venous insufficiency of lower extremity 01/23/2021   Allergic rhinitis due to pollen 06/29/2020   Diabetic neuropathy ([place]) 03/13/2020   Erectile dysfunction due to arterial insufficiency 03/13/2020   History of gout 11/28/2019   Coronary artery disease involving native heart without angina pectoris 03/29/2019   Onychomycosis 12/01/2017   Obesity (BMI 30-39.9) 02/18/2016   Chest pain with moderate risk of acute coronary syndrome 09/17/2015   Type 2 diabetes mellitus with diabetic neuropathy, unspecified ([place]) 04/23/2015   Thrush 04/23/2015   Type 2 diabetes 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, with long-term current use of insulin ([place]) 02/16/2014   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   Hammer toe, acquired 02/16/2014   CAD S/P percutaneous coronary angioplasty -  01/24/2014   Cardiac murmur 02/21/2011   Bilateral lower extremity edema 12/24/2010   Vitamin B12 deficiency 07/19/2010   Anemia due to vitamin B12 deficiency 10/31/2009   Trigger finger 10/16/2009   Dyslipidemia 03/01/2007   Gout 03/01/2007   Essential hypertension 03/01/2007   Gastroesophageal reflux disease 03/01/2007   Past Surgical History:  Procedure Laterality Date   AMPUTATION Left 07/25/2022   Procedure: AMPUTATION LEFT FOOT THROUGH MIDDLE OF FOOT;  Surgeon: Newt Minion, MD;  Location: [facility];  Service: Orthopedics;  Laterality: Left;   BICEPS TENDON REPAIR  Nov 13 2010   right   HAND SURGERY  1990   left   LEFT HEART CATH N/A 05/12/2013   Procedure: LEFT HEART CATH;  Surgeon: Sinclair Grooms, MD;  Location: [facility] C[facility] LAB;  Service: Cardiovascular;   Laterality: N/A;   LEFT HEART CATHETERIZATION WITH CORONARY ANGIOGRAM N/A 01/24/2014   Procedure: LEFT HEART CATHETERIZATION WITH CORONARY ANGIOGRAM;  Surgeon: Peter M Martinique, MD;  Location: [facility] CATH LAB;  Service: Cardiovascular;  Laterality: N/A;   PERCUTANEOUS CORONARY STENT INTERVENTION (PCI-S)  05/2013   mCx 3.46mm x 20 mm Promus P DES   PERCUTANEOUS CORONARY STENT INTERVENTION (PCI-S)  05/12/2013   Procedure: PERCUTANEOUS CORONARY STENT INTERVENTION (PCI-S);  Surgeon: Sinclair Grooms, MD;  Location: [facility] CATH LAB;  Service: Cardiovascular;;   Family History  Problem Relation Age of Onset   Heart attack Father        deceased   Diabetes Father    Congestive Heart Failure Father    Stroke Mother    Pancreatic cancer Maternal Aunt    Colon cancer Neg Hx    Outpatient Medications Prior to Visit  Medication Sig Dispense Refill   acetaminophen (TYLENOL) 325 MG tablet Take 2 tablets (650 mg total) by mouth every 4 (four) hours as needed for headache or mild pain.     allopurinol (ZYLOPRIM) 100 MG tablet TAKE 1 TABLET(100 MG) BY MOUTH DAILY (Patient taking differently: Take 100 mg by mouth daily as needed (as directed- for gout).) 90 tablet 0   alum & mag hydroxide-simeth (MAALOX/MYLANTA) 200-200-20 MG/5ML suspension Take 15-30 mLs by mouth every 2 (two) hours as needed for indigestion. 355 mL 0   amLODipine (NORVASC) 10 MG tablet NEW PRESCRIPTION REQUEST: TAKE ONE TABLET BY MOUTH DAILY 90 tablet 3   aspirin 81 MG chewable tablet Chew 1 tablet (81 mg total) by mouth daily. (Patient taking differently: Chew 81 mg by mouth daily at 12 noon.)     bisacodyl (DULCOLAX) 5 MG EC tablet Take 1 tablet (5 mg total) by mouth daily as needed for moderate constipation. 30 tablet 0   docusate sodium (COLACE) 100 MG capsule Take 1 capsule (100 mg total) by mouth daily. 10 capsule 0   Evolocumab (REPATHA SURECLICK) [id number] MG/ML SOAJ Inject 140 mg into the skin every 14 (fourteen) days. 6 mL 3   FARXIGA 5 MG TABS  tablet TAKE 1 TABLET(5 MG) BY MOUTH DAILY BREAKFAST 90 tablet 3   fluticasone (FLONASE) 50 MCG/ACT nasal spray Place 1 spray into both nostrils daily as needed for allergies or rhinitis. 18.2 mL 6   Glycerin-Polysorbate 80 (REFRESH DRY EYE THERAPY OP) Place 1 drop into both eyes 3 (three) times daily as needed (for dryness).     hydrocortisone (ANUSOL-HC) 25 MG suppository Place 1 suppository (25 mg total) rectally 2 (two) times daily. 12 suppository 0   insulin degludec (TRESIBA FLEXTOUCH) 100 UNIT/ML FlexTouch Pen Inject 30 Units into the skin 2 (two) times daily. 45 mL 5   Insulin Pen Needle 31G X 8 MM MISC 1 Device by Does not apply route in the morning, at noon, in the evening, and at bedtime. 400 each 2   ketoconazole (NIZORAL) 2 % cream Apply 1 Application topically daily. 60 g 2   nebivolol (BYSTOLIC) 5 MG tablet NEW PRESCRIPTION REQUEST: TAKE ONE TABLET BY MOUTH DAILY 90 tablet 3   nitroGLYCERIN (NITROSTAT) 0.4 MG SL tablet DISSOLVE 1 TABLET UNDER THE TONGUE  EVERY 5 MINUTES AS NEEDED FOR CHEST PAIN. DO NOT EXCEED A TOTAL OF 3 DOSES IN 15 MINUTES. 25 tablet 2   pantoprazole (PROTONIX) 40 MG tablet Take 1 tablet (40 mg total) by mouth daily. 90 tablet 3   polyethylene glycol (MIRALAX / GLYCOLAX) 17 g packet Take 17 g by mouth 2 (two) times daily. 14 each 0   rosuvastatin (CRESTOR) 40 MG tablet NEW PRESCRIPTION REQUEST: TAKE ONE TABLET BY MOUTH DAILY 90 tablet 3   Semaglutide, 1 MG/DOSE, 4 MG/3ML SOPN Inject 1 mg into the skin once a week.     tadalafil (CIALIS) 20 MG tablet Take 20 mg by mouth daily as needed.     vitamin B-12 (CYANOCOBALAMIN) 500 MCG tablet NEW PRESCRIPTION REQUEST: TAKE ONE TABLET BY MOUTH DAILY 90 tablet 3   losartan (COZAAR) 25 MG tablet Take 1 tablet (25 mg total) by mouth daily. 90 tablet 3   oxyCODONE (OXY IR/ROXICODONE) 5 MG immediate release tablet Take 1-2 tablets (5-10 mg total) by mouth every 4 (four) hours as needed for moderate pain (pain score 4-6). (Patient not  taking: Reported on 02/03/2023) 30 tablet 0   No facility-administered medications prior to visit.   Allergies  Allergen Reactions   Influenza Vaccines Anaphylaxis    Reaction June 2017   Influenza Virus Vaccine H5n1 Anaphylaxis   Fenofibrate Other (See Comments)    Hot flashes   Lisinopril Cough   Metformin Other (See Comments)    Chest discomfort   Prednisone Other (See Comments)    Mood swings   Augmentin [Amoxicillin-Pot Clavulanate] Rash    07/2022 tolerated cefazolin   Latex Rash   Objective:   Today's Vitals   02/03/23 1547  BP: 136/82  Pulse: 96  Temp: 97.8 F (36.6 C)  TempSrc: Temporal  SpO2: 99%  Weight: (!) 300 lb 9.6 oz (136.4 kg)  Height: 6' (1.829 m)   Body mass index is 40.77 kg/m.   General: Well developed, well nourished. No acute distress. Extremities: The left shin shows some mild redness. There is 1-2+ edema of the lower leg. The left foot stump is nearly   completely healed at this point. Psych: Alert and oriented. Normal mood and affect.  Health Maintenance Due  Topic Date Due   OPHTHALMOLOGY EXAM  Never done   Hepatitis C Screening  Never done   Zoster Vaccines- Shingrix (1 of 2) Never done   DTaP/Tdap/Td (4 - Td or Tdap) 10/10/2022     Assessment & Plan:   Problem List Items Addressed This Visit       Cardiovascular and Mediastinum   Essential hypertension (Chronic)    Blood pressure is ia little higher today. Continue amlodipine 10 mg daily and nebivolol 5 mg daily. I iwll increase the losartan to 50 mg daily.       Relevant Medications   losartan (COZAAR) 50 MG tablet     Endocrine   Type 2 diabetes 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, with long-term current use of insulin ([facility]) - Primary    Continue to follow with Ms. Minna Antis. Continue dapagliflozin (Farxiga) 5 mg daily, insulin degludec 30 units bid, and semaglutide (Ozempic) 1 mg weekly. I will check his A1c and blood sugar.       Relevant Medications   losartan  (COZAAR) 50 MG tablet   Other Relevant Orders   Glucose, random   Hemoglobin A1c     Genitourinary   Chronic kidney disease (CKD) stage G1/A2, glomerular filtration rate (  GFR) equal to or greater than 90 mL/min/1.73 square meter and albuminuria creatinine ratio between 30-299 mg/g    Reviewed renal labs and implications of CKD. He is currently on a SGLT2i and ARB. Reviewed avoidance of NSAIDs.        Other   S/P transmetatarsal amputation of foot, left (HCC)    Improved healing. I will check labs to see if there is any sing of lingering infection. The redness may be more of a venous stasis issue. I support Aaron Franco looking into options for vocational rehabilitation and to consider looking into permanent disability, which is reasonable.      Relevant Orders   C-reactive protein   CBC with Differential/Platelet   Sedimentation rate   Other Visit Diagnoses     Encounter for hepatitis C screening test for low risk patient       Relevant Orders   HCV Ab w Reflex to Quant PCR       Return in about 6 weeks (around 03/17/2023) for Reassessment.   Haydee Salter, MD

## 2023-02-03 NOTE — Assessment & Plan Note (Signed)
Improved healing. I will check labs to see if there is any sing of lingering infection. The redness may be more of a venous stasis issue. I support [name] looking into options for vocational rehabilitation and to consider looking into permanent disability, which is reasonable.

## 2023-02-04 ENCOUNTER — Encounter: Payer: Self-pay | Admitting: Family Medicine

## 2023-02-04 ENCOUNTER — Other Ambulatory Visit (INDEPENDENT_AMBULATORY_CARE_PROVIDER_SITE_OTHER): Payer: BC Managed Care – PPO

## 2023-02-04 DIAGNOSIS — Z1159 Encounter for screening for other viral diseases: Secondary | ICD-10-CM

## 2023-02-04 DIAGNOSIS — Z89432 Acquired absence of left foot: Secondary | ICD-10-CM | POA: Diagnosis not present

## 2023-02-04 DIAGNOSIS — Z794 Long term (current) use of insulin: Secondary | ICD-10-CM | POA: Diagnosis not present

## 2023-02-04 DIAGNOSIS — N181 Chronic kidney disease, stage 1: Secondary | ICD-10-CM

## 2023-02-04 DIAGNOSIS — E1122 Type 2 diabetes mellitus with diabetic chronic kidney disease: Secondary | ICD-10-CM

## 2023-02-04 DIAGNOSIS — E1169 Type 2 diabetes mellitus with other specified complication: Secondary | ICD-10-CM

## 2023-02-04 LAB — CBC WITH DIFFERENTIAL/PLATELET
Basophils Absolute: 0.1 10*3/uL (ref 0.0–0.1)
Basophils Relative: 0.7 % (ref 0.0–3.0)
Eosinophils Absolute: 0.1 10*3/uL (ref 0.0–0.7)
Eosinophils Relative: 1 % (ref 0.0–5.0)
HCT: 42.8 % (ref 39.0–52.0)
Hemoglobin: 13.9 g/dL (ref 13.0–17.0)
Lymphocytes Relative: 25.5 % (ref 12.0–46.0)
Lymphs Abs: 1.9 10*3/uL (ref 0.7–4.0)
MCHC: 32.5 g/dL (ref 30.0–36.0)
MCV: 76 fl — ABNORMAL LOW (ref 78.0–100.0)
Monocytes Absolute: 0.8 10*3/uL (ref 0.1–1.0)
Monocytes Relative: 10.6 % (ref 3.0–12.0)
Neutro Abs: 4.7 10*3/uL (ref 1.4–7.7)
Neutrophils Relative %: 62.2 % (ref 43.0–77.0)
Platelets: 333 10*3/uL (ref 150.0–400.0)
RBC: 5.64 Mil/uL (ref 4.22–5.81)
RDW: 17 % — ABNORMAL HIGH (ref 11.5–15.5)
WBC: 7.6 10*3/uL (ref 4.0–10.5)

## 2023-02-04 LAB — SEDIMENTATION RATE: Sed Rate: 72 mm/hr — ABNORMAL HIGH (ref 0–20)

## 2023-02-04 LAB — HEMOGLOBIN A1C: Hgb A1c MFr Bld: 9 % — ABNORMAL HIGH (ref 4.6–6.5)

## 2023-02-04 LAB — C-REACTIVE PROTEIN: CRP: <1 mg/dL (ref 0.5–20.0)

## 2023-02-04 LAB — GLUCOSE, RANDOM: Glucose, Bld: 174 mg/dL — ABNORMAL HIGH (ref 70–99)

## 2023-02-05 LAB — COMPREHENSIVE METABOLIC PANEL
AG Ratio: 1.2 (calc) (ref 1.0–2.5)
ALT: 15 U/L (ref 9–46)
AST: 15 U/L (ref 10–35)
Albumin: 4.2 g/dL (ref 3.6–5.1)
Alkaline phosphatase (APISO): 87 U/L (ref 35–144)
BUN: 17 mg/dL (ref 7–25)
CO2: 20 mmol/L (ref 20–32)
Calcium: 9.5 mg/dL (ref 8.6–10.3)
Chloride: 99 mmol/L (ref 98–110)
Creat: 0.99 mg/dL (ref 0.70–1.30)
Globulin: 3.4 g/dL (calc) (ref 1.9–3.7)
Glucose, Bld: 174 mg/dL — ABNORMAL HIGH (ref 65–99)
Potassium: 4.4 mmol/L (ref 3.5–5.3)
Sodium: 138 mmol/L (ref 135–146)
Total Bilirubin: 0.4 mg/dL (ref 0.2–1.2)
Total Protein: 7.6 g/dL (ref 6.1–8.1)

## 2023-02-06 LAB — HCV INTERPRETATION

## 2023-02-06 LAB — HCV AB W REFLEX TO QUANT PCR: HCV Ab: NONREACTIVE

## 2023-02-10 ENCOUNTER — Encounter: Payer: Self-pay | Admitting: Family Medicine

## 2023-02-12 ENCOUNTER — Encounter: Payer: Self-pay | Admitting: Family Medicine

## 2023-02-13 NOTE — Telephone Encounter (Signed)
Dis regard message.  I meant to ask patient.  Dm/cma

## 2023-02-16 ENCOUNTER — Ambulatory Visit (INDEPENDENT_AMBULATORY_CARE_PROVIDER_SITE_OTHER): Payer: BC Managed Care – PPO | Admitting: Internal Medicine

## 2023-02-16 ENCOUNTER — Encounter: Payer: Self-pay | Admitting: Internal Medicine

## 2023-02-16 ENCOUNTER — Other Ambulatory Visit: Payer: Self-pay

## 2023-02-16 VITALS — BP 167/89 | HR 89 | Resp 16 | Ht 72.0 in | Wt 298.0 lb

## 2023-02-16 DIAGNOSIS — M869 Osteomyelitis, unspecified: Secondary | ICD-10-CM | POA: Diagnosis not present

## 2023-02-16 DIAGNOSIS — E1169 Type 2 diabetes mellitus with other specified complication: Secondary | ICD-10-CM | POA: Diagnosis not present

## 2023-02-16 DIAGNOSIS — L0390 Cellulitis, unspecified: Secondary | ICD-10-CM | POA: Diagnosis not present

## 2023-02-16 NOTE — Progress Notes (Unsigned)
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 without macular edema, with long-term current use of insulin 09/09/2022   Uncontrolled type 2 diabetes mellitus with hyperglycemia, with long-term current use of insulin 09/09/2022   S/P transmetatarsal amputation of foot, left 08/18/2022   Sepsis 07/24/2022   Normocytic anemia 07/24/2022   Diabetic foot infection 07/23/2022   Chronic venous insufficiency of lower extremity 01/23/2021   Allergic rhinitis due to pollen 06/29/2020   Diabetic neuropathy 03/13/2020   Erectile dysfunction due to arterial insufficiency 03/13/2020   History of gout 11/28/2019   Coronary artery disease involving native heart without angina pectoris 03/29/2019   Onychomycosis 12/01/2017   Obesity (BMI 30-39.9) 02/18/2016   Chest pain with moderate risk of acute coronary syndrome 09/17/2015   Type 2 diabetes mellitus with diabetic neuropathy, unspecified 04/23/2015   Thrush 04/23/2015   Type 2 diabetes 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, with long-term current use of insulin 02/16/2014   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   Hammer toe, acquired 02/16/2014   CAD S/P percutaneous coronary angioplasty -  01/24/2014   Cardiac murmur 02/21/2011   Bilateral lower extremity edema 12/24/2010   Vitamin B12 deficiency 07/19/2010   Anemia due to vitamin B12 deficiency 10/31/2009   Trigger finger 10/16/2009   Dyslipidemia 03/01/2007   Gout 03/01/2007   Essential hypertension 03/01/2007   Gastroesophageal reflux disease 03/01/2007    Patient's Medications  New Prescriptions   No medications on file  Previous Medications   ACETAMINOPHEN (TYLENOL) 325 MG TABLET     Take 2 tablets (650 mg total) by mouth every 4 (four) hours as needed for headache or mild pain.   ALLOPURINOL (ZYLOPRIM) 100 MG TABLET    TAKE 1 TABLET(100 MG) BY MOUTH DAILY   ALUM & MAG HYDROXIDE-SIMETH (MAALOX/MYLANTA) 200-200-20 MG/5ML SUSPENSION    Take 15-30 mLs by mouth every 2 (two) hours as needed for indigestion.   AMLODIPINE (NORVASC) 10 MG TABLET    NEW PRESCRIPTION REQUEST: TAKE ONE TABLET BY MOUTH DAILY   ASPIRIN 81 MG CHEWABLE TABLET    Chew 1 tablet (81 mg total) by mouth daily.   BISACODYL (DULCOLAX) 5 MG EC TABLET    Take 1 tablet (5 mg total) by mouth daily as needed for moderate constipation.   DOCUSATE SODIUM (COLACE) 100 MG CAPSULE    Take 1 capsule (100 mg total) by mouth daily.   EVOLOCUMAB (REPATHA SURECLICK) 140 MG/ML SOAJ    Inject 140 mg into the skin every 14 (fourteen) days.   FARXIGA 5 MG TABS TABLET    TAKE 1 TABLET(5 MG) BY MOUTH DAILY BREAKFAST   FLUTICASONE (FLONASE) 50 MCG/ACT NASAL SPRAY    Place 1 spray into both nostrils daily as needed for allergies or rhinitis.   GLYCERIN-POLYSORBATE 80 (REFRESH DRY EYE THERAPY OP)    Place 1 drop into both eyes 3 (three) times daily as needed (for dryness).   HYDROCORTISONE (ANUSOL-HC) 25 MG SUPPOSITORY    Place 1 suppository (25 mg total) rectally 2 (two) times daily.   INSULIN DEGLUDEC (TRESIBA FLEXTOUCH) 100 UNIT/ML FLEXTOUCH PEN    Inject 30 Units into the skin 2 (two) times daily.   INSULIN PEN NEEDLE 31G  X 8 MM MISC    1 Device by Does not apply route in the morning, at noon, in the evening, and at bedtime.   KETOCONAZOLE (NIZORAL) 2 % CREAM    Apply 1 Application topically daily.   LOSARTAN (COZAAR) 50 MG TABLET    Take 1 tablet (50 mg total) by mouth daily.   NEBIVOLOL (BYSTOLIC) 5 MG TABLET    NEW PRESCRIPTION REQUEST: TAKE ONE TABLET BY MOUTH DAILY   NITROGLYCERIN (NITROSTAT) 0.4 MG SL TABLET    DISSOLVE 1 TABLET UNDER THE TONGUE EVERY 5 MINUTES AS NEEDED FOR CHEST PAIN. DO NOT EXCEED A TOTAL OF 3 DOSES IN 15  MINUTES.   OXYCODONE (OXY IR/ROXICODONE) 5 MG IMMEDIATE RELEASE TABLET    Take 1-2 tablets (5-10 mg total) by mouth every 4 (four) hours as needed for moderate pain (pain score 4-6).   PANTOPRAZOLE (PROTONIX) 40 MG TABLET    Take 1 tablet (40 mg total) by mouth daily.   POLYETHYLENE GLYCOL (MIRALAX / GLYCOLAX) 17 G PACKET    Take 17 g by mouth 2 (two) times daily.   ROSUVASTATIN (CRESTOR) 40 MG TABLET    NEW PRESCRIPTION REQUEST: TAKE ONE TABLET BY MOUTH DAILY   SEMAGLUTIDE, 1 MG/DOSE, 4 MG/3ML SOPN    Inject 1 mg into the skin once a week.   TADALAFIL (CIALIS) 20 MG TABLET    Take 20 mg by mouth daily as needed.   VITAMIN B-12 (CYANOCOBALAMIN) 500 MCG TABLET    NEW PRESCRIPTION REQUEST: TAKE ONE TABLET BY MOUTH DAILY  Modified Medications   No medications on file  Discontinued Medications   No medications on file    Subjective: Aaron Franco is a 52 y.o. male with hypertension, hyperlipidemia, CAD status post PCI, heart failure, poorly controlled diabetes, GERD, obesity presented for left foot infection.  On arrival to the ED he had a temp of 101.3, WBC 14 K.  MRI of left foot showed 5th metatarsal head, proximal phalanx bone marrow edema with small joint effusion, 4th metatarsal head bone marrow edema. Intramuscular signal plantar muscle c/w myositis, abscess 1.5x3.4x3.3cm about dorsal 4, 5th metrasal heads.  He was started on broad-spectrum antibiotics. Taken to OR with Dr. Lajoyce Corners on 9/15 for left TMA, abscess necrotic tissue Cx+ staph lugdunensis, E faecalis, corynebacterium sp(contamination). Pt had rash to augmentin and dishcarged on linezolid and infectious disease follow-up.  Patient notes that he only took linezolid for 2 days as he picked it up later.  Then called orthopedics office due to confusion over Augmentin(pt was taking linezolid at the time).  He was switched to doxycycline, he has taken about 2 days of Doxy.  08/04/22: He denies fevers and chills.  No nausea or vomiting.  Reports  he was not on antibiotics prior to hospitalization.  Reports he was working on glycemic control. 08/11/22: No new complaints. Denies fever and chills. Wound vac is off.  100% adherent to antibiotics.  Reports blurry vision this AM.  Today 02/16/23:   Review of Systems: Review of Systems  All other systems reviewed and are negative.   Past Medical History:  Diagnosis Date   CAD (coronary artery disease)    a. 05/2013 NSTEMI/PCI: mCFX 100%-> 3.0 mm  x 20 mm Promus premier DES, EF 65%;  01/2014 Cath: LM nl, LAD 20-30d, LCX patent distal stent, RCA 30-68m, EF 55-65%.   Diabetes mellitus    Essential hypertension    GERD (gastroesophageal reflux disease)    Gout    History  of echocardiogram    a. 03/2011 Echo: EF 65%, mild LVH, no rwma, mildly dil LA.   Hyperlipidemia    MI, old    Microalbuminuria due to type 2 diabetes mellitus (HCC) 12/14/2017   Obesity     Social History   Tobacco Use   Smoking status: Former    Types: Cigarettes    Quit date: 11/10/1998    Years since quitting: 24.2   Smokeless tobacco: Never   Tobacco comments:    pt only smoked 1-2 cigs a day, not everyday on occs x 2 years  Vaping Use   Vaping Use: Never used  Substance Use Topics   Alcohol use: Yes    Comment: rare   Drug use: No    Family History  Problem Relation Age of Onset   Heart attack Father        deceased   Diabetes Father    Congestive Heart Failure Father    Stroke Mother    Pancreatic cancer Maternal Aunt    Colon cancer Neg Hx     Allergies  Allergen Reactions   Influenza Vaccines Anaphylaxis    Reaction June 2017   Influenza Virus Vaccine H5n1 Anaphylaxis   Fenofibrate Other (See Comments)    Hot flashes   Lisinopril Cough   Metformin Other (See Comments)    Chest discomfort   Prednisone Other (See Comments)    Mood swings   Augmentin [Amoxicillin-Pot Clavulanate] Rash    07/2022 tolerated cefazolin   Latex Rash    Health Maintenance  Topic Date Due   OPHTHALMOLOGY  EXAM  Never done   Zoster Vaccines- Shingrix (1 of 2) Never done   DTaP/Tdap/Td (4 - Td or Tdap) 10/10/2022   COVID-19 Vaccine (1) 02/19/2023 (Originally 01/02/1972)   INFLUENZA VACCINE  06/11/2023   HEMOGLOBIN A1C  08/07/2023   Diabetic kidney evaluation - Urine ACR  11/12/2023   FOOT EXAM  11/12/2023   Diabetic kidney evaluation - eGFR measurement  02/04/2024   Fecal DNA (Cologuard)  12/15/2025   Hepatitis C Screening  Completed   HIV Screening  Completed   HPV VACCINES  Aged Out    Objective:  There were no vitals filed for this visit. There is no height or weight on file to calculate BMI.  Physical Exam Constitutional:      General: He is not in acute distress.    Appearance: He is normal weight. He is not toxic-appearing.  HENT:     Head: Normocephalic and atraumatic.     Right Ear: External ear normal.     Left Ear: External ear normal.     Nose: No congestion or rhinorrhea.     Mouth/Throat:     Mouth: Mucous membranes are moist.     Pharynx: Oropharynx is clear.  Eyes:     Extraocular Movements: Extraocular movements intact.     Conjunctiva/sclera: Conjunctivae normal.     Pupils: Pupils are equal, round, and reactive to light.  Cardiovascular:     Rate and Rhythm: Normal rate and regular rhythm.     Heart sounds: No murmur heard.    No friction rub. No gallop.  Pulmonary:     Effort: Pulmonary effort is normal.     Breath sounds: Normal breath sounds.  Abdominal:     General: Abdomen is flat. Bowel sounds are normal.     Palpations: Abdomen is soft.  Musculoskeletal:        General: No swelling. Normal range of motion.  Cervical back: Normal range of motion and neck supple.  Skin:    General: Skin is warm and dry.  Neurological:     General: No focal deficit present.     Mental Status: He is oriented to person, place, and time.  Psychiatric:        Mood and Affect: Mood normal.     Lab Results Lab Results  Component Value Date   WBC 7.6  02/04/2023   HGB 13.9 02/04/2023   HCT 42.8 02/04/2023   MCV 76.0 (L) 02/04/2023   PLT 333.0 02/04/2023    Lab Results  Component Value Date   CREATININE 0.99 02/04/2023   BUN 17 02/04/2023   NA 138 02/04/2023   K 4.4 02/04/2023   CL 99 02/04/2023   CO2 20 02/04/2023    Lab Results  Component Value Date   ALT 15 02/04/2023   AST 15 02/04/2023   ALKPHOS 91 07/23/2022   BILITOT 0.4 02/04/2023    Lab Results  Component Value Date   CHOL 352 (H) 11/28/2021   HDL 34 (L) 11/28/2021   LDLCALC Comment (A) 11/28/2021   LDLDIRECT 151 (H) 11/28/2021   TRIG 1,016 (HH) 11/28/2021   CHOLHDL 10.4 (H) 11/28/2021   No results found for: "LABRPR", "RPRTITER" No results found for: "HIV1RNAQUANT", "HIV1RNAVL", "CD4TABS"   Problem List Items Addressed This Visit   None  Assessment/Plan  #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 Intramuscular signal plantar muscle c/w myositis, abscess 1.5x3.4x3.3cm about dorsal 4, 5th metrasal heads OR with Dr. Lajoyce Corners on 9/15 for left TMA  for OM and abscess, abscess necrotic tissue Cx+ stpah lugdunedsis and E faecalis, corynebacterium sp(contamination). Found to have large abscess extended to over midfoot. - Pt had rash to augmentin and dishcarged on linezolid.  -Pt took about 2 days of linezolid then on 9/22 linezolid switched to doxy by Ortho(concern pt was on augmentin) - At ID visit on 9/25 stopped doxy as it does not cover  E faecalis and Rx 1 week of linezolid to cover efaecalis and staph lugeneisis as wound vac was still on and could not assess wound.  Lab 9/25 esr 108(99), crp 26(21), wbc 11k -  We have no bone cx, per OR note tissue was viable . No concern for skin soft tisssue infection on  exam today. As such treatment for complicated skin soft tissue infection/abscess complete. Abx stopped at last visit. Today, wound is healed.  Plan: -Follow-up PRN     #Elevated BP -rechecked 168/89 -F/U with PCP on anti-htn meds  #DM -On ozempic -A1c 9, improved glycemic control   Danelle Earthly, MD [facility] 02/16/2023, 11:13 AM   I have personally spent 45 minutes involved in face-to-face and non-face-to-face activities for this patient on the day of the visit. Professional time spent includes the following activities: Preparing to see the patient (review of tests), Obtaining and/or reviewing separately obtained history (admission/discharge record), Performing a medically appropriate examination and/or evaluation , Ordering medications/tests/procedures, referring and communicating with other health care professionals, Documenting clinical information in the EMR, Independently interpreting results (not separately reported), Communicating results to the patient/family/caregiver, Counseling and educating the patient/family/caregiver and Care coordination (not separately reported).

## 2023-02-17 ENCOUNTER — Encounter: Payer: Self-pay | Admitting: Family Medicine

## 2023-03-06 ENCOUNTER — Telehealth: Payer: Self-pay

## 2023-03-06 NOTE — Telephone Encounter (Signed)
PA initiated via Covermymeds; KEY: [id number]. PA approved.   Your PA request has been approved. Additional information will be provided in the approval communication. (Message [id number]) Authorization Expiration Date: 03/05/2024

## 2023-03-10 ENCOUNTER — Encounter: Payer: Self-pay | Admitting: Family Medicine

## 2023-03-10 ENCOUNTER — Ambulatory Visit (INDEPENDENT_AMBULATORY_CARE_PROVIDER_SITE_OTHER): Payer: BC Managed Care – PPO | Admitting: Family Medicine

## 2023-03-10 VITALS — BP 160/90 | HR 99 | Temp 97.8°F | Ht 72.0 in | Wt 296.0 lb

## 2023-03-10 DIAGNOSIS — E1140 Type 2 diabetes mellitus with diabetic neuropathy, unspecified: Secondary | ICD-10-CM

## 2023-03-10 DIAGNOSIS — Z794 Long term (current) use of insulin: Secondary | ICD-10-CM | POA: Diagnosis not present

## 2023-03-10 DIAGNOSIS — I10 Essential (primary) hypertension: Secondary | ICD-10-CM

## 2023-03-10 DIAGNOSIS — Z89432 Acquired absence of left foot: Secondary | ICD-10-CM

## 2023-03-10 NOTE — Progress Notes (Signed)
[facility] [address] [place] [address] Dept: [phone] Dept Fax: [phone]  Chronic Care Office Visit  Subjective:    Patient ID: Aaron Franco, male    DOB: 10-Nov-1971, 52 y.o..   MRN: [id number]  Chief Complaint  Patient presents with   Medical Management of Chronic Issues   History of Present Illness:  Patient is in today for reassessment of chronic medical issues.  Aaron Franco had a left transmetatarsal amputation secondary to a diabetic foot infection in mid-Sept. He is near fully healed at this point. He has some chronic edema of the left lower leg. He had an assessment with Vocational Rehabilitation and is awaiting their advice/assistance concerning potential job retraining for work that will not involve prolonged standing. At this point, I do not believe it will be advisable for him to return tot he heavier physical labor he was doing previously.   Aaron Franco has a history of hypertension. He is on amlodipine 10 mg daily, nebivolol 5 mg daily, and losartan 25 mg daily. He notes he has checked some Bps at home and these are under 130/80.   Aaron Franco has had issues with uncontrolled diabetes. He is followed by Sonny Dandy, NP (endocrinology). He continues to be on dapagliflozin Marcelline Deist) 5 mg daily, insulin degludec 30 units bid, and semaglutide (Ozempic) 1 mg weekly. He is pleased to see he is still have some mild weight loss.  Past Medical History: 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 without macular edema, with long-term current use of insulin (HCC) 09/09/2022   Uncontrolled type 2 diabetes mellitus with  hyperglycemia, with long-term current use of insulin (HCC) 09/09/2022   S/P transmetatarsal amputation of foot, left (HCC) 08/18/2022   Normocytic anemia 07/24/2022   Diabetic foot infection (HCC) 07/23/2022   Chronic venous insufficiency of lower extremity 01/23/2021   Allergic rhinitis due to pollen 06/29/2020   Diabetic neuropathy (HCC) 03/13/2020   Erectile dysfunction due to arterial insufficiency 03/13/2020   Coronary artery disease involving native heart without angina pectoris 03/29/2019   Onychomycosis 12/01/2017   Obesity (BMI 30-39.9) 02/18/2016   Chest pain with moderate risk of acute coronary syndrome 09/17/2015   Type 2 diabetes mellitus with diabetic neuropathy, unspecified (HCC) 04/23/2015   Thrush 04/23/2015   Type 2 diabetes 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, with long-term current use of insulin (HCC) 02/16/2014   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   Hammer toe, acquired 02/16/2014   CAD S/P percutaneous coronary angioplasty -  01/24/2014   Cardiac murmur 02/21/2011   Bilateral lower extremity edema 12/24/2010   Vitamin B12 deficiency 07/19/2010   Anemia due to vitamin B12 deficiency 10/31/2009   Trigger finger 10/16/2009   Dyslipidemia 03/01/2007   Gout 03/01/2007   Essential hypertension 03/01/2007   Gastroesophageal reflux disease 03/01/2007   Past Surgical History:  Procedure Laterality Date   AMPUTATION Left 07/25/2022   Procedure: AMPUTATION LEFT FOOT THROUGH MIDDLE OF FOOT;  Surgeon: Nadara Mustard, MD;  Location: [facility] OR;  Service: Orthopedics;  Laterality: Left;   BICEPS TENDON REPAIR  Nov 13 2010   right   HAND SURGERY  1990   left   LEFT HEART CATH N/A 05/12/2013  Procedure: LEFT HEART CATH;  Surgeon: Lesleigh Noe, MD;  Location: [facility] CATH LAB;  Service: Cardiovascular;  Laterality: N/A;   LEFT HEART CATHETERIZATION WITH CORONARY ANGIOGRAM N/A 01/24/2014   Procedure: LEFT HEART CATHETERIZATION WITH CORONARY ANGIOGRAM;  Surgeon: Peter M  Swaziland, MD;  Location: [facility] CATH LAB;  Service: Cardiovascular;  Laterality: N/A;   PERCUTANEOUS CORONARY STENT INTERVENTION (PCI-S)  05/2013   mCx 3.43mm x 20 mm Promus P DES   PERCUTANEOUS CORONARY STENT INTERVENTION (PCI-S)  05/12/2013   Procedure: PERCUTANEOUS CORONARY STENT INTERVENTION (PCI-S);  Surgeon: Lesleigh Noe, MD;  Location: [facility] CATH LAB;  Service: Cardiovascular;;   Family History  Problem Relation Age of Onset   Heart attack Father        deceased   Diabetes Father    Congestive Heart Failure Father    Stroke Mother    Pancreatic cancer Maternal Aunt    Colon cancer Neg Hx    Outpatient Medications Prior to Visit  Medication Sig Dispense Refill   acetaminophen (TYLENOL) 325 MG tablet Take 2 tablets (650 mg total) by mouth every 4 (four) hours as needed for headache or mild pain.     allopurinol (ZYLOPRIM) 100 MG tablet TAKE 1 TABLET(100 MG) BY MOUTH DAILY (Patient taking differently: Take 100 mg by mouth daily as needed (as directed- for gout).) 90 tablet 0   alum & mag hydroxide-simeth (MAALOX/MYLANTA) 200-200-20 MG/5ML suspension Take 15-30 mLs by mouth every 2 (two) hours as needed for indigestion. 355 mL 0   amLODipine (NORVASC) 10 MG tablet NEW PRESCRIPTION REQUEST: TAKE ONE TABLET BY MOUTH DAILY 90 tablet 3   aspirin 81 MG chewable tablet Chew 1 tablet (81 mg total) by mouth daily. (Patient taking differently: Chew 81 mg by mouth daily at 12 noon.)     bisacodyl (DULCOLAX) 5 MG EC tablet Take 1 tablet (5 mg total) by mouth daily as needed for moderate constipation. 30 tablet 0   docusate sodium (COLACE) 100 MG capsule Take 1 capsule (100 mg total) by mouth daily. 10 capsule 0   Evolocumab (REPATHA SURECLICK) 140 MG/ML SOAJ Inject 140 mg into the skin every 14 (fourteen) days. 6 mL 3   FARXIGA 5 MG TABS tablet TAKE 1 TABLET(5 MG) BY MOUTH DAILY BREAKFAST 90 tablet 3   fluticasone (FLONASE) 50 MCG/ACT nasal spray Place 1 spray into both nostrils daily as needed for  allergies or rhinitis. 18.2 mL 6   Glycerin-Polysorbate 80 (REFRESH DRY EYE THERAPY OP) Place 1 drop into both eyes 3 (three) times daily as needed (for dryness).     hydrocortisone (ANUSOL-HC) 25 MG suppository Place 1 suppository (25 mg total) rectally 2 (two) times daily. 12 suppository 0   insulin degludec (TRESIBA FLEXTOUCH) 100 UNIT/ML FlexTouch Pen Inject 30 Units into the skin 2 (two) times daily. 45 mL 5   Insulin Pen Needle 31G X 8 MM MISC 1 Device by Does not apply route in the morning, at noon, in the evening, and at bedtime. 400 each 2   ketoconazole (NIZORAL) 2 % cream Apply 1 Application topically daily. 60 g 2   losartan (COZAAR) 50 MG tablet Take 1 tablet (50 mg total) by mouth daily. 90 tablet 3   nebivolol (BYSTOLIC) 5 MG tablet NEW PRESCRIPTION REQUEST: TAKE ONE TABLET BY MOUTH DAILY 90 tablet 3   nitroGLYCERIN (NITROSTAT) 0.4 MG SL tablet DISSOLVE 1 TABLET UNDER THE TONGUE EVERY 5 MINUTES AS NEEDED FOR CHEST PAIN. DO NOT EXCEED A TOTAL  OF 3 DOSES IN 15 MINUTES. 25 tablet 2   oxyCODONE (OXY IR/ROXICODONE) 5 MG immediate release tablet Take 1-2 tablets (5-10 mg total) by mouth every 4 (four) hours as needed for moderate pain (pain score 4-6). 30 tablet 0   pantoprazole (PROTONIX) 40 MG tablet Take 1 tablet (40 mg total) by mouth daily. 90 tablet 3   polyethylene glycol (MIRALAX / GLYCOLAX) 17 g packet Take 17 g by mouth 2 (two) times daily. 14 each 0   rosuvastatin (CRESTOR) 40 MG tablet NEW PRESCRIPTION REQUEST: TAKE ONE TABLET BY MOUTH DAILY 90 tablet 3   Semaglutide, 1 MG/DOSE, 4 MG/3ML SOPN Inject 1 mg into the skin once a week.     tadalafil (CIALIS) 20 MG tablet Take 20 mg by mouth daily as needed.     vitamin B-12 (CYANOCOBALAMIN) 500 MCG tablet NEW PRESCRIPTION REQUEST: TAKE ONE TABLET BY MOUTH DAILY 90 tablet 3   No facility-administered medications prior to visit.   Allergies  Allergen Reactions   Influenza Vaccines Anaphylaxis    Reaction June 2017   Influenza  Virus Vaccine H5n1 Anaphylaxis   Fenofibrate Other (See Comments)    Hot flashes   Lisinopril Cough   Metformin Other (See Comments)    Chest discomfort   Prednisone Other (See Comments)    Mood swings   Augmentin [Amoxicillin-Pot Clavulanate] Rash    07/2022 tolerated cefazolin   Latex Rash   Objective:   Today's Vitals   03/10/23 1548 03/10/23 1608  BP: (!) 174/90 (!) 160/90  Pulse: 99   Temp: 97.8 F (36.6 C)   TempSrc: Temporal   SpO2: 99%   Weight: 296 lb (134.3 kg)   Height: 6' (1.829 m)    Body mass index is 40.14 kg/m.   General: Well developed, well nourished. No acute distress. Feet: Skin intact. No sing of maceration between toes ont he right foot. Stump well healed on the left. Psych: Alert and oriented. Normal mood and affect.  Health Maintenance Due  Topic Date Due   COVID-19 Vaccine (1) Never done   OPHTHALMOLOGY EXAM  Never done   Zoster Vaccines- Shingrix (1 of 2) Never done   DTaP/Tdap/Td (4 - Td or Tdap) 10/10/2022   Lab Results    Latest Ref Rng & Units 02/04/2023   11:05 AM 11/11/2022    3:59 PM 08/11/2022    3:51 PM  CMP  Glucose 70 - 99 mg/dL 65 - 99 mg/dL 161    096  045  409   BUN 7 - 25 mg/dL 17  16  17    Creatinine 0.70 - 1.30 mg/dL 8.11  9.14  7.82   Sodium 135 - 146 mmol/L 138  138  139   Potassium 3.5 - 5.3 mmol/L 4.4  4.1  4.6   Chloride 98 - 110 mmol/L 99  102  105   CO2 20 - 32 mmol/L 20  25  22    Calcium 8.6 - 10.3 mg/dL 9.5  9.6  9.3   Total Protein 6.1 - 8.1 g/dL 7.6   7.8   Total Bilirubin 0.2 - 1.2 mg/dL 0.4   0.3   AST 10 - 35 U/L 15   13   ALT 9 - 46 U/L 15   18    Last hemoglobin A1c Lab Results  Component Value Date   HGBA1C 9.0 (H) 02/04/2023      Assessment & Plan:   Problem List Items Addressed This Visit  Cardiovascular and Mediastinum   Essential hypertension - Primary (Chronic)    Blood pressure continues to be high in the office. I have asked Aaron Franco to record his BP at home for 2 weeks  and send me the results. Continue amlodipine 10 mg daily, nebivolol 5 mg daily, and losartan 50 mg daily.         Endocrine   Type 2 diabetes mellitus with diabetic neuropathy, unspecified (HCC)    Continue to follow with Ms. Ricki Miller. Continue dapagliflozin (Farxiga) 5 mg daily, insulin degludec 30 units bid, and semaglutide (Ozempic) 1 mg weekly.        Other   S/P transmetatarsal amputation of foot, left (HCC)    Near fully healed. I support Aaron Franco looking into options for vocational rehabilitation and to consider looking into permanent disability, which is reasonable. I will write a letter for him to his current employers.      Return in about 2 months (around 05/10/2023).   Loyola Mast, MD

## 2023-03-10 NOTE — Assessment & Plan Note (Signed)
Near fully healed. I support Aaron Franco looking into options for vocational rehabilitation and to consider looking into permanent disability, which is reasonable. I will write a letter for him to his current employers.

## 2023-03-10 NOTE — Assessment & Plan Note (Signed)
Continue to follow with Ms. Aaron Franco. Continue dapagliflozin (Farxiga) 5 mg daily, insulin degludec 30 units bid, and semaglutide (Ozempic) 1 mg weekly.

## 2023-03-10 NOTE — Assessment & Plan Note (Addendum)
Blood pressure continues to be high in the office. I have asked Aaron Franco to record his BP at home for 2 weeks and send me the results. Continue amlodipine 10 mg daily, nebivolol 5 mg daily, and losartan 50 mg daily.

## 2023-03-11 ENCOUNTER — Telehealth: Payer: Self-pay

## 2023-03-11 DIAGNOSIS — E1165 Type 2 diabetes mellitus with hyperglycemia: Secondary | ICD-10-CM | POA: Diagnosis not present

## 2023-03-11 DIAGNOSIS — Z794 Long term (current) use of insulin: Secondary | ICD-10-CM | POA: Diagnosis not present

## 2023-03-11 NOTE — Telephone Encounter (Signed)
Faxed letter to both [facility]) [phone], and [facility] Burton [phone].  Dm/cma

## 2023-03-12 ENCOUNTER — Encounter: Payer: Self-pay | Admitting: Family Medicine

## 2023-03-13 ENCOUNTER — Other Ambulatory Visit (HOSPITAL_COMMUNITY): Payer: Self-pay

## 2023-03-17 ENCOUNTER — Encounter: Payer: Self-pay | Admitting: Podiatry

## 2023-03-17 ENCOUNTER — Ambulatory Visit (INDEPENDENT_AMBULATORY_CARE_PROVIDER_SITE_OTHER): Payer: BC Managed Care – PPO | Admitting: Podiatry

## 2023-03-17 ENCOUNTER — Encounter: Payer: Self-pay | Admitting: *Deleted

## 2023-03-17 DIAGNOSIS — B351 Tinea unguium: Secondary | ICD-10-CM

## 2023-03-17 DIAGNOSIS — Z794 Long term (current) use of insulin: Secondary | ICD-10-CM

## 2023-03-17 DIAGNOSIS — B353 Tinea pedis: Secondary | ICD-10-CM

## 2023-03-17 DIAGNOSIS — E1140 Type 2 diabetes mellitus with diabetic neuropathy, unspecified: Secondary | ICD-10-CM

## 2023-03-17 DIAGNOSIS — Z89432 Acquired absence of left foot: Secondary | ICD-10-CM

## 2023-03-18 NOTE — Progress Notes (Signed)
  Subjective:  Patient ID: Aaron Franco, male    DOB: 12/25/1970,  MRN: [id number]  Aaron Franco presents to clinic today for at risk foot care. Patient has h/o NIDDM, neuropathy with amputation of Transmetatarsal amputation left lower extremity. He is accompanied by his wife on today's visit. Chief Complaint  Patient presents with   Nail Problem    [facility] BS-did not check today A1C-8.0 PCP-[facility] PCP VST-03/11/2023   New problem(s): None.   PCP is Loyola Mast, MD.  Allergies  Allergen Reactions   Influenza Vaccines Anaphylaxis    Reaction June 2017   Influenza Virus Vaccine H5n1 Anaphylaxis   Fenofibrate Other (See Comments)    Hot flashes   Lisinopril Cough   Metformin Other (See Comments)    Chest discomfort   Prednisone Other (See Comments)    Mood swings   Augmentin [Amoxicillin-Pot Clavulanate] Rash    07/2022 tolerated cefazolin   Latex Rash    Review of Systems: Negative except as noted in the HPI.  Objective: No changes noted in today's physical examination. There were no vitals filed for this visit. Aaron Franco is a pleasant 52 y.o. male morbidly obese in NAD. AAO x 3.  Vascular Examination: Capillary refill time immediate b/l. Vascular status intact b/l with palpable pedal pulses. Pedal hair sparse b/l. No edema. No pain with calf compression b/l. Skin temperature gradient WNL b/l. No ischemia or gangrene noted b/l LE. No cyanosis or clubbing noted b/l LE.  Neurological Examination: Protective sensation diminished with 10g monofilament b/l.  Dermatological Examination: Pedal skin with normal turgor, texture and tone b/l.  No open wounds. No interdigital macerations.   Toenails 1-5 right foot thick, discolored, elongated with subungual debris and pain on dorsal palpation.   Minimal hyperkeratosis right 1st metatarsal.  Musculoskeletal Examination: Muscle strength 5/5 to all lower extremity muscle groups bilaterally. Lower extremity  amputation(s): Transmetatarsal amputation left lower extremity. Pes planus deformity noted right foot.  Radiographs: None  Last A1c:      Latest Ref Rng & Units 02/04/2023   11:05 AM 11/11/2022    3:59 PM 07/23/2022    9:11 PM  Hemoglobin A1C  Hemoglobin-A1c 4.6 - 6.5 % 9.0  10.0  13.4    Assessment/Plan: 1. Onychomycosis   2. Tinea pedis of both feet   3. S/P transmetatarsal amputation of foot, left (HCC)   4. Type 2 diabetes mellitus with diabetic neuropathy, with long-term current use of insulin (HCC)    -Patient's family member present. All questions/concerns addressed on today's visit. -Patient to continue soft, supportive shoe gear daily. -Toenails were debrided in length and girth 1-5 right foot with sterile nail nippers and dremel without iatrogenic bleeding.  -As a courtesy, callus(es) submet head 1 right foot pared utilizing rotary bur without complication or incident. Total number pared =1. -Patient to resume using Ketoconazole Cream 2% to both feet once daily for an additional 6 weeks. He has this at home. Contact pharmacy if he needs a refill. He and his wife related understanding. -Patient/POA to call should there be question/concern in the interim.   Return in about 3 months (around 06/17/2023).  Freddie Breech, DPM

## 2023-03-20 ENCOUNTER — Other Ambulatory Visit: Payer: Self-pay | Admitting: Family Medicine

## 2023-03-20 ENCOUNTER — Encounter: Payer: Self-pay | Admitting: Family Medicine

## 2023-03-20 DIAGNOSIS — I10 Essential (primary) hypertension: Secondary | ICD-10-CM

## 2023-03-25 ENCOUNTER — Ambulatory Visit: Payer: BC Managed Care – PPO | Admitting: Cardiology

## 2023-03-27 ENCOUNTER — Ambulatory Visit: Payer: BC Managed Care – PPO | Admitting: Family Medicine

## 2023-04-01 ENCOUNTER — Encounter: Payer: Self-pay | Admitting: Family Medicine

## 2023-04-15 ENCOUNTER — Encounter: Payer: Self-pay | Admitting: Family Medicine

## 2023-05-11 ENCOUNTER — Ambulatory Visit (INDEPENDENT_AMBULATORY_CARE_PROVIDER_SITE_OTHER): Payer: BC Managed Care – PPO | Admitting: Family Medicine

## 2023-05-11 ENCOUNTER — Encounter: Payer: Self-pay | Admitting: Family Medicine

## 2023-05-11 VITALS — BP 130/78 | HR 90 | Temp 98.2°F | Ht 72.0 in | Wt 298.6 lb

## 2023-05-11 DIAGNOSIS — I10 Essential (primary) hypertension: Secondary | ICD-10-CM

## 2023-05-11 DIAGNOSIS — M1A09X0 Idiopathic chronic gout, multiple sites, without tophus (tophi): Secondary | ICD-10-CM

## 2023-05-11 DIAGNOSIS — E1142 Type 2 diabetes mellitus with diabetic polyneuropathy: Secondary | ICD-10-CM | POA: Diagnosis not present

## 2023-05-11 DIAGNOSIS — E785 Hyperlipidemia, unspecified: Secondary | ICD-10-CM

## 2023-05-11 DIAGNOSIS — E1140 Type 2 diabetes mellitus with diabetic neuropathy, unspecified: Secondary | ICD-10-CM | POA: Diagnosis not present

## 2023-05-11 DIAGNOSIS — Z794 Long term (current) use of insulin: Secondary | ICD-10-CM

## 2023-05-11 DIAGNOSIS — R600 Localized edema: Secondary | ICD-10-CM

## 2023-05-11 DIAGNOSIS — I2510 Atherosclerotic heart disease of native coronary artery without angina pectoris: Secondary | ICD-10-CM

## 2023-05-11 MED ORDER — FUROSEMIDE 20 MG PO TABS
20.0000 mg | ORAL_TABLET | Freq: Every day | ORAL | 3 refills | Status: AC
Start: 2023-05-11 — End: ?

## 2023-05-11 MED ORDER — GABAPENTIN 300 MG PO CAPS
300.0000 mg | ORAL_CAPSULE | Freq: Three times a day (TID) | ORAL | 3 refills | Status: DC
Start: 2023-05-11 — End: 2023-09-25

## 2023-05-11 MED ORDER — ALLOPURINOL 100 MG PO TABS: ORAL_TABLET | ORAL | 0 refills | Status: DC

## 2023-05-11 MED ORDER — LOSARTAN POTASSIUM 50 MG PO TABS
50.0000 mg | ORAL_TABLET | Freq: Two times a day (BID) | ORAL | 3 refills | Status: AC
Start: 2023-05-11 — End: ?

## 2023-05-11 MED ORDER — NITROGLYCERIN 0.4 MG SL SUBL: SUBLINGUAL_TABLET | SUBLINGUAL | 2 refills | Status: DC

## 2023-05-11 NOTE — Assessment & Plan Note (Signed)
I will try adding some furosemide to reduce lower leg edema.

## 2023-05-11 NOTE — Assessment & Plan Note (Signed)
Continue to follow with Ms. Aaron Franco. Improving by history. I will check an A1c today. Continue dapagliflozin (Farxiga) 5 mg daily, insulin degludec 30 units bid, and semaglutide (Ozempic) 1 mg weekly.

## 2023-05-11 NOTE — Assessment & Plan Note (Signed)
Patient asks for renewal of gabapentin, which has helped for intermittent use related to his lower leg pain.

## 2023-05-11 NOTE — Assessment & Plan Note (Signed)
Stable. No angina. I will renew his NTG for PRN use.

## 2023-05-11 NOTE — Assessment & Plan Note (Signed)
Stable. Continue Repatha and rosuvastatin 40 mg daily.

## 2023-05-11 NOTE — Assessment & Plan Note (Signed)
Stable. I will refill his allopurinol.

## 2023-05-11 NOTE — Assessment & Plan Note (Signed)
Blood pressure continues to be high in the office. Continue amlodipine 10 mg daily, nebivolol 5 mg daily, and increase losartan to 50 mg twice daily.

## 2023-05-11 NOTE — Progress Notes (Signed)
[facility] [address] [place] [address] Dept: [phone] Dept Fax: [phone]  Chronic Care Office Visit  Subjective:    Patient ID: Aaron Franco, male    DOB: 1970/12/02, 52 y.o..   MRN: [id number]  Chief Complaint  Patient presents with   Medical Management of Chronic Issues    2 month f/u.  C/o having swelling on the LT Foot/leg and RT foot.      History of Present Illness:  Patient is in today for reassessment of chronic medical issues.  Mr. Streng had a left transmetatarsal amputation secondary to a diabetic foot infection in mid-Sept. 2023. He is fully healed at this point. He has some chronic edema of both lower legs, L>R.    Mr. Najafi has a history of hypertension. He is on amlodipine 10 mg daily, nebivolol 5 mg daily, and losartan 50 mg daily.    Mr. Colliver has had issues with uncontrolled diabetes. He is followed by Sonny Dandy, NP (endocrinology). He continues to be on dapagliflozin Marcelline Deist) 5 mg daily, insulin degludec 30 units bid, and semaglutide (Ozempic) 1 mg weekly. He last A1c was reported as 8%.  Past Medical History: 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 without macular edema, with long-term current use of insulin (HCC) 09/09/2022   Uncontrolled type 2 diabetes mellitus with hyperglycemia, with long-term current use of insulin (HCC) 09/09/2022   S/P transmetatarsal amputation of foot, left (HCC) 08/18/2022   Normocytic anemia 07/24/2022   Diabetic foot infection (HCC) 07/23/2022   Chronic venous insufficiency of lower extremity 01/23/2021   Allergic rhinitis due to pollen 06/29/2020    Diabetic neuropathy (HCC) 03/13/2020   Erectile dysfunction due to arterial insufficiency 03/13/2020   Coronary artery disease involving native heart without angina pectoris 03/29/2019   Onychomycosis 12/01/2017   Obesity (BMI 30-39.9) 02/18/2016   Chest pain with moderate risk of acute coronary syndrome 09/17/2015   Type 2 diabetes mellitus with diabetic neuropathy, unspecified (HCC) 04/23/2015   Thrush 04/23/2015   Type 2 diabetes 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, with long-term current use of insulin (HCC) 02/16/2014   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   Hammer toe, acquired 02/16/2014   CAD S/P percutaneous coronary angioplasty -  01/24/2014   Cardiac murmur 02/21/2011   Bilateral lower extremity edema 12/24/2010   Vitamin B12 deficiency 07/19/2010   Anemia due to vitamin B12 deficiency 10/31/2009   Trigger finger 10/16/2009   Dyslipidemia 03/01/2007   Gout 03/01/2007   Essential hypertension 03/01/2007   Gastroesophageal reflux disease 03/01/2007   Past Surgical History:  Procedure Laterality Date   AMPUTATION Left 07/25/2022   Procedure: AMPUTATION LEFT FOOT THROUGH MIDDLE OF FOOT;  Surgeon: Nadara Mustard, MD;  Location: [facility] OR;  Service: Orthopedics;  Laterality: Left;   BICEPS TENDON REPAIR  Nov 13 2010   right   HAND SURGERY  1990   left   LEFT HEART CATH N/A 05/12/2013   Procedure: LEFT HEART CATH;  Surgeon: Lesleigh Noe, MD;  Location: [facility] CATH LAB;  Service: Cardiovascular;  Laterality: N/A;   LEFT HEART CATHETERIZATION WITH CORONARY ANGIOGRAM N/A 01/24/2014   Procedure: LEFT HEART CATHETERIZATION WITH CORONARY ANGIOGRAM;  Surgeon: Demetria Pore  [place], MD;  Location: [facility] CATH LAB;  Service: Cardiovascular;  Laterality: N/A;   PERCUTANEOUS CORONARY STENT INTERVENTION (PCI-S)  05/2013   mCx 3.15mm x 20 mm Promus P DES   PERCUTANEOUS CORONARY STENT INTERVENTION (PCI-S)  05/12/2013   Procedure: PERCUTANEOUS CORONARY STENT INTERVENTION (PCI-S);  Surgeon: Lesleigh Noe, MD;  Location: [facility] CATH LAB;  Service: Cardiovascular;;   Family History  Problem Relation Age of Onset   Heart attack Father        deceased   Diabetes Father    Congestive Heart Failure Father    Stroke Mother    Pancreatic cancer Maternal Aunt    Colon cancer Neg Hx    Outpatient Medications Prior to Visit  Medication Sig Dispense Refill   acetaminophen (TYLENOL) 325 MG tablet Take 2 tablets (650 mg total) by mouth every 4 (four) hours as needed for headache or mild pain.     alum & mag hydroxide-simeth (MAALOX/MYLANTA) 200-200-20 MG/5ML suspension Take 15-30 mLs by mouth every 2 (two) hours as needed for indigestion. 355 mL 0   amLODipine (NORVASC) 10 MG tablet TAKE 1 TABLET(10 MG) BY MOUTH DAILY 90 tablet 3   aspirin 81 MG chewable tablet Chew 1 tablet (81 mg total) by mouth daily. (Patient taking differently: Chew 81 mg by mouth daily at 12 noon.)     Evolocumab (REPATHA SURECLICK) 140 MG/ML SOAJ Inject 140 mg into the skin every 14 (fourteen) days. 6 mL 3   FARXIGA 5 MG TABS tablet TAKE 1 TABLET(5 MG) BY MOUTH DAILY BREAKFAST 90 tablet 3   fluticasone (FLONASE) 50 MCG/ACT nasal spray Place 1 spray into both nostrils daily as needed for allergies or rhinitis. 18.2 mL 6   Glycerin-Polysorbate 80 (REFRESH DRY EYE THERAPY OP) Place 1 drop into both eyes 3 (three) times daily as needed (for dryness).     insulin degludec (TRESIBA FLEXTOUCH) 100 UNIT/ML FlexTouch Pen Inject 30 Units into the skin 2 (two) times daily. 45 mL 5   Insulin Pen Needle 31G X 8 MM MISC 1 Device by Does not apply route in the morning, at noon, in the evening, and at bedtime. 400 each 2   ketoconazole (NIZORAL) 2 % cream Apply 1 Application topically daily. 60 g 2   losartan (COZAAR) 50 MG tablet Take 1 tablet (50 mg total) by mouth daily. 90 tablet 3   nebivolol (BYSTOLIC) 5 MG tablet NEW PRESCRIPTION REQUEST: TAKE ONE TABLET BY MOUTH DAILY 90 tablet 3   OZEMPIC, 2 MG/DOSE, 8 MG/3ML SOPN Inject into the skin.      polyethylene glycol (MIRALAX / GLYCOLAX) 17 g packet Take 17 g by mouth 2 (two) times daily. 14 each 0   rosuvastatin (CRESTOR) 40 MG tablet NEW PRESCRIPTION REQUEST: TAKE ONE TABLET BY MOUTH DAILY 90 tablet 3   tadalafil (CIALIS) 20 MG tablet Take 20 mg by mouth daily as needed.     vitamin B-12 (CYANOCOBALAMIN) 500 MCG tablet NEW PRESCRIPTION REQUEST: TAKE ONE TABLET BY MOUTH DAILY 90 tablet 3   allopurinol (ZYLOPRIM) 100 MG tablet TAKE 1 TABLET(100 MG) BY MOUTH DAILY (Patient taking differently: Take 100 mg by mouth daily as needed (as directed- for gout).) 90 tablet 0   nitroGLYCERIN (NITROSTAT) 0.4 MG SL tablet DISSOLVE 1 TABLET UNDER THE TONGUE EVERY 5 MINUTES AS NEEDED FOR CHEST PAIN. DO NOT EXCEED A TOTAL OF 3 DOSES IN 15 MINUTES. 25 tablet 2   bisacodyl (DULCOLAX) 5 MG EC tablet Take 1 tablet (5  mg total) by mouth daily as needed for moderate constipation. (Patient not taking: Reported on 05/11/2023) 30 tablet 0   docusate sodium (COLACE) 100 MG capsule Take 1 capsule (100 mg total) by mouth daily. (Patient not taking: Reported on 05/11/2023) 10 capsule 0   hydrocortisone (ANUSOL-HC) 25 MG suppository Place 1 suppository (25 mg total) rectally 2 (two) times daily. (Patient not taking: Reported on 05/11/2023) 12 suppository 0   oxyCODONE (OXY IR/ROXICODONE) 5 MG immediate release tablet Take 1-2 tablets (5-10 mg total) by mouth every 4 (four) hours as needed for moderate pain (pain score 4-6). 30 tablet 0   pantoprazole (PROTONIX) 40 MG tablet Take 1 tablet (40 mg total) by mouth daily. 90 tablet 3   No facility-administered medications prior to visit.   Allergies  Allergen Reactions   Influenza Vaccines Anaphylaxis    Reaction June 2017   Influenza Virus Vaccine H5n1 Anaphylaxis   Fenofibrate Other (See Comments)    Hot flashes   Lisinopril Cough   Metformin Other (See Comments)    Chest discomfort   Prednisone Other (See Comments)    Mood swings   Augmentin [Amoxicillin-Pot  Clavulanate] Rash    07/2022 tolerated cefazolin   Latex Rash   Objective:   Today's Vitals   05/11/23 1542 05/11/23 1620  BP: (!) 144/86 130/78  Pulse: 90   Temp: 98.2 F (36.8 C)   TempSrc: Temporal   SpO2: 99%   Weight: 298 lb 9.6 oz (135.4 kg)   Height: 6' (1.829 m)    Body mass index is 40.5 kg/m.   General: Well developed, well nourished. No acute distress. Right Foot: There is a small 1-2 mm blood blister on the end of the right great toe. Skin is intact. Psych: Alert and oriented. Normal mood and affect.  Health Maintenance Due  Topic Date Due   OPHTHALMOLOGY EXAM  Never done   Zoster Vaccines- Shingrix (1 of 2) Never done   DTaP/Tdap/Td (4 - Td or Tdap) 10/10/2022     Assessment & Plan:   Problem List Items Addressed This Visit       Cardiovascular and Mediastinum   Essential hypertension - Primary (Chronic)    Blood pressure continues to be high in the office. Continue amlodipine 10 mg daily, nebivolol 5 mg daily, and increase losartan to 50 mg twice daily.       Relevant Medications   nitroGLYCERIN (NITROSTAT) 0.4 MG SL tablet   furosemide (LASIX) 20 MG tablet   losartan (COZAAR) 50 MG tablet   Coronary artery disease involving native heart without angina pectoris    Stable. No angina. I will renew his NTG for PRN use.      Relevant Medications   nitroGLYCERIN (NITROSTAT) 0.4 MG SL tablet   furosemide (LASIX) 20 MG tablet   losartan (COZAAR) 50 MG tablet     Endocrine   Type 2 diabetes mellitus with diabetic neuropathy, unspecified (HCC)    Continue to follow with Ms. Ricki Miller. Improving by history. I will check an A1c today. Continue dapagliflozin (Farxiga) 5 mg daily, insulin degludec 30 units bid, and semaglutide (Ozempic) 1 mg weekly.      Relevant Medications   losartan (COZAAR) 50 MG tablet   Other Relevant Orders   Glucose, random   Hemoglobin A1c   Diabetic neuropathy (HCC)    Patient asks for renewal of gabapentin, which has helped for  intermittent use related to his lower leg pain.      Relevant Medications  gabapentin (NEURONTIN) 300 MG capsule   losartan (COZAAR) 50 MG tablet     Other   Dyslipidemia    Stable. Continue Repatha and rosuvastatin 40 mg daily.      Relevant Orders   Lipid panel   Gout    Stable. I will refill his allopurinol.      Relevant Medications   allopurinol (ZYLOPRIM) 100 MG tablet   Bilateral lower extremity edema    I will try adding some furosemide to reduce lower leg edema.       Return in about 3 months (around 08/11/2023) for Reassessment.   Loyola Mast, MD

## 2023-05-18 ENCOUNTER — Encounter: Payer: Self-pay | Admitting: Family Medicine

## 2023-05-21 ENCOUNTER — Other Ambulatory Visit (HOSPITAL_COMMUNITY): Payer: Self-pay

## 2023-05-21 ENCOUNTER — Telehealth: Payer: Self-pay

## 2023-05-21 NOTE — Telephone Encounter (Signed)
Pharmacy Patient Advocate Encounter   Received notification that prior authorization for Dexlansoprazole 60MG  dr capsules is required/requested.   PA submitted to [facility] via CoverMyMeds Key or (Medicaid) confirmation # [id number] Status is pending

## 2023-05-22 ENCOUNTER — Other Ambulatory Visit (HOSPITAL_COMMUNITY): Payer: Self-pay

## 2023-05-22 NOTE — Telephone Encounter (Signed)
Pharmacy Patient Advocate Encounter  Received notification from  [employer]  that Prior Authorization for  Dexlansoprazole 60MG  dr capsules  has been APPROVED from 04/22/23 to 05/21/24.Marland Kitchen

## 2023-05-22 NOTE — Telephone Encounter (Signed)
Pharmacy Patient Advocate Encounter  Received notification from [facility] that Prior Authorization for Dexlansoprazole 60MG  dr capsules has been APPROVED from 05/21/23 to 05/19/24.Marland Kitchen  PA #/Case ID/Reference #: [id number]

## 2023-05-26 ENCOUNTER — Other Ambulatory Visit (INDEPENDENT_AMBULATORY_CARE_PROVIDER_SITE_OTHER): Payer: BC Managed Care – PPO

## 2023-05-26 DIAGNOSIS — Z794 Long term (current) use of insulin: Secondary | ICD-10-CM

## 2023-05-26 DIAGNOSIS — E785 Hyperlipidemia, unspecified: Secondary | ICD-10-CM

## 2023-05-26 DIAGNOSIS — E1140 Type 2 diabetes mellitus with diabetic neuropathy, unspecified: Secondary | ICD-10-CM | POA: Diagnosis not present

## 2023-05-26 LAB — GLUCOSE, RANDOM: Glucose, Bld: 172 mg/dL — ABNORMAL HIGH (ref 70–99)

## 2023-05-26 LAB — HEMOGLOBIN A1C: Hgb A1c MFr Bld: 8.2 % — ABNORMAL HIGH (ref 4.6–6.5)

## 2023-05-26 LAB — LIPID PANEL
Cholesterol: 208 mg/dL — ABNORMAL HIGH (ref 0–200)
HDL: 35.2 mg/dL — ABNORMAL LOW (ref >39.00)
Total CHOL/HDL Ratio: 6
Triglycerides: 405 mg/dL — ABNORMAL HIGH (ref 0.0–149.0)

## 2023-05-26 LAB — LDL CHOLESTEROL, DIRECT: Direct LDL: 97 mg/dL

## 2023-06-11 DIAGNOSIS — Z794 Long term (current) use of insulin: Secondary | ICD-10-CM | POA: Diagnosis not present

## 2023-06-11 DIAGNOSIS — E1165 Type 2 diabetes mellitus with hyperglycemia: Secondary | ICD-10-CM | POA: Diagnosis not present

## 2023-06-17 NOTE — Progress Notes (Deleted)
Cardiology Office Note:   Date:  06/17/2023  ID:  Aaron Franco, DOB June 24, 1971, MRN [id number] PCP: Loyola Mast, MD  [facility] Providers Cardiologist:  Rollene Rotunda, MD {  History of Present Illness:   Aaron Franco is a 52 y.o. male who presents for follow up of CAD.  He has a history of of STEMI 2014 w/ DES CFX.  Cath demonstrated non obstructive disease in 2015.   He had chest pain in 2018 and a negative perfusion study.  We saw him in Feb 2021 he was in the ED with chest pain.  He had a low risk perfusion study.    He returns for follow up.  ***   ***  Since I last saw him he was in the hospital.  This was in September.  He had sepsis.  He had an infection in his foot and had to have amputation of all of the toes on his left foot.  I reviewed these records and impressively he did not have any cardiac complications.  Because he had some staph bacteremia he did have an echocardiogram which demonstrated well-preserved ejection fraction.  There was some mild aortic stenosis but a minimal gradient.   He returns for follow-up.  He says he is doing well.  His blood sugar had an A1c of 14 and is now down to 10.  He was started on Ozempic.  He has not been taking his PCSK9.  He has been taking his blood pressure medications.  He has not been exercising as much as I would like and he has not been necessarily following his diet but he is going to try.  He denies any new cardiovascular symptoms and has had no shortness of breath, PND or orthopnea.  He had no palpitations, presyncope or syncope.  He denies any chest pain.  ROS: ***  Studies Reviewed:    EKG:       ***  Risk Assessment/Calculations:   {Does this patient have ATRIAL FIBRILLATION?[phone]} No BP recorded.  {Refresh Note OR Click here to enter BP  :1}***        Physical Exam:   VS:  There were no vitals taken for this visit.   Wt Readings from Last 3 Encounters:  05/11/23 298 lb 9.6 oz (135.4 kg)   03/10/23 296 lb (134.3 kg)  02/16/23 298 lb (135.2 kg)     GEN: Well nourished, well developed in no acute distress NECK: No JVD; No carotid bruits CARDIAC: ***RRR, no murmurs, rubs, gallops RESPIRATORY:  Clear to auscultation without rales, wheezing or rhonchi  ABDOMEN: Soft, non-tender, non-distended EXTREMITIES:  No edema; No deformity   ASSESSMENT AND PLAN:   CAD:   ***   The patient has no new sypmtoms.  No further cardiovascular testing is indicated.  We will continue with aggressive risk reduction and meds as listed.   Hypertension:   The blood pressure is ***  at elevated.  I am not quite convinced he has been compliant with his meds and he is encouraged to take the meds as listed and keep a blood pressure diary.  He also needs weight loss dietary changes.   Hyperlipidemia:   LDL was ***  151.  He has been off his Repatha.  I am going to reorder this and repeat a lipid profile in 3 months.    DM2:    A1c was ***  10.0 but he says this is down from 14.  He is now on Ozempic.  He is followed by an endocrinologist.  He understands importance of better diet.     {Are you ordering a CV Procedure (e.g. stress test, cath, DCCV, TEE, etc)?   Press F2        :[id number]}  Follow up ***  Signed, Rollene Rotunda, MD

## 2023-06-18 ENCOUNTER — Ambulatory Visit: Payer: BC Managed Care – PPO | Admitting: Cardiology

## 2023-06-18 DIAGNOSIS — I10 Essential (primary) hypertension: Secondary | ICD-10-CM

## 2023-06-18 DIAGNOSIS — E118 Type 2 diabetes mellitus with unspecified complications: Secondary | ICD-10-CM

## 2023-06-18 DIAGNOSIS — E785 Hyperlipidemia, unspecified: Secondary | ICD-10-CM

## 2023-06-18 DIAGNOSIS — I2510 Atherosclerotic heart disease of native coronary artery without angina pectoris: Secondary | ICD-10-CM

## 2023-06-19 ENCOUNTER — Encounter: Payer: Self-pay | Admitting: Family Medicine

## 2023-06-22 ENCOUNTER — Ambulatory Visit: Payer: BC Managed Care – PPO | Admitting: Podiatry

## 2023-06-22 DIAGNOSIS — B351 Tinea unguium: Secondary | ICD-10-CM | POA: Diagnosis not present

## 2023-06-22 DIAGNOSIS — R600 Localized edema: Secondary | ICD-10-CM

## 2023-06-22 DIAGNOSIS — Z794 Long term (current) use of insulin: Secondary | ICD-10-CM

## 2023-06-22 DIAGNOSIS — E1140 Type 2 diabetes mellitus with diabetic neuropathy, unspecified: Secondary | ICD-10-CM | POA: Diagnosis not present

## 2023-06-23 ENCOUNTER — Encounter: Payer: Self-pay | Admitting: Family Medicine

## 2023-06-23 ENCOUNTER — Other Ambulatory Visit (HOSPITAL_COMMUNITY): Payer: Self-pay

## 2023-06-23 ENCOUNTER — Telehealth: Payer: Self-pay

## 2023-06-23 ENCOUNTER — Ambulatory Visit (INDEPENDENT_AMBULATORY_CARE_PROVIDER_SITE_OTHER): Payer: BC Managed Care – PPO | Admitting: Family Medicine

## 2023-06-23 VITALS — BP 138/86 | HR 82 | Temp 98.7°F | Ht 72.0 in | Wt 295.8 lb

## 2023-06-23 DIAGNOSIS — H2513 Age-related nuclear cataract, bilateral: Secondary | ICD-10-CM | POA: Insufficient documentation

## 2023-06-23 DIAGNOSIS — E113293 Type 2 diabetes mellitus with mild nonproliferative diabetic retinopathy without macular edema, bilateral: Secondary | ICD-10-CM | POA: Insufficient documentation

## 2023-06-23 DIAGNOSIS — K0262 Dental caries on smooth surface penetrating into dentin: Secondary | ICD-10-CM | POA: Diagnosis not present

## 2023-06-23 DIAGNOSIS — E113413 Type 2 diabetes mellitus with severe nonproliferative diabetic retinopathy with macular edema, bilateral: Secondary | ICD-10-CM | POA: Insufficient documentation

## 2023-06-23 MED ORDER — ACETAMINOPHEN-CODEINE 300-30 MG PO TABS
1.0000 | ORAL_TABLET | ORAL | 0 refills | Status: DC | PRN
Start: 2023-06-23 — End: 2024-01-11

## 2023-06-23 MED ORDER — CEPHALEXIN 500 MG PO CAPS: 500.0000 mg | ORAL_CAPSULE | Freq: Four times a day (QID) | ORAL | 0 refills | Status: DC

## 2023-06-23 NOTE — Progress Notes (Signed)
  Subjective:  Patient ID: Aaron Franco, male    DOB: 1971-03-11,  MRN: [id number]  Chief Complaint  Patient presents with   Nail Problem    Dfc     52 y.o. male presents with the above complaint. History confirmed with patient.  He is doing well feels like the skin is doing better, the nails are still thickened elongated, not having much severe or consistent pain but is having persistent edema in both feet and ankles  Objective:  Physical Exam: warm, good capillary refill, no trophic changes or ulcerative lesions, normal DP and PT pulses, tinea pedis has resolved, and abnormal sensory exam with diffuse neuropathy. Left Foot:  Well-healed transmetatarsal amputation Right Foot: dystrophic yellowed discolored nail plates with subungual debris   Assessment:   1. Onychomycosis   2. Type 2 diabetes mellitus with diabetic neuropathy, with long-term current use of insulin (HCC)   3. Peripheral edema       Plan:  Patient was evaluated and treated and all questions answered.  We discussed lower extremity peripheral edema especially in the setting of surgery, we also discussed possible etiologies and that contribute to this including renal function, cardiovascular function and vein function.  He has utilize compression stockings before but this is difficult with his left foot TMA.  I recommend he use the 30 to 40 mmHg stockings on the right side and obtain OTC 10 to 20 mmHg stocking for the left side which likely will be easier to put on and more comfortable.  We also discussed elevating the legs at the end of the day and he will discuss further with his PCP at his next visit.  Discussed the etiology and treatment options for the condition in detail with the patient.  Recommended debridement of the nails today. Sharp and mechanical debridement performed of all painful and mycotic nails today. Nails debrided in length and thickness using a nail nipper to level of comfort. Discussed treatment  options including appropriate shoe gear. Follow up as needed for painful nails.      No follow-ups on file.

## 2023-06-23 NOTE — Patient Instructions (Signed)
Follow-up soon with dental provider.

## 2023-06-23 NOTE — Progress Notes (Signed)
[facility] [address] [place] [address] Dept: [phone] Dept Fax: [phone]  Office Visit  Subjective:    Patient ID: Aaron Franco, male    DOB: 07-Mar-1971, 52 y.o..   MRN: [id number]  Chief Complaint  Patient presents with   Dental Pain    C/o having tooth pain on the RT side x 1 1/2 weeks.  Has been using oral gel &Tylenol   History of Present Illness:  Patient is in today complaining of a 10-day history of pain in his right lower rear molar. He notes that the tooth had cracked some months ago. He had been doing okay, managing with good dental care. However, now this is painful with chewing. he does have a dentist that he has seen previously.  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 without macular edema, with long-term current use of insulin (HCC) 09/09/2022   Uncontrolled type 2 diabetes mellitus with hyperglycemia, with long-term current use of insulin (HCC) 09/09/2022   S/P transmetatarsal amputation of foot, left (HCC) 08/18/2022   Normocytic anemia 07/24/2022   Diabetic foot infection (HCC) 07/23/2022   Chronic venous insufficiency of lower extremity 01/23/2021   Allergic rhinitis due to pollen 06/29/2020   Diabetic neuropathy (HCC) 03/13/2020   Erectile dysfunction due to arterial insufficiency 03/13/2020   Coronary artery disease involving native heart without angina pectoris 03/29/2019   Onychomycosis 12/01/2017   Obesity (BMI 30-39.9) 02/18/2016   Chest pain  with moderate risk of acute coronary syndrome 09/17/2015   Type 2 diabetes mellitus with diabetic neuropathy, unspecified (HCC) 04/23/2015   Thrush 04/23/2015   Type 2 diabetes 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, with long-term current use of insulin (HCC) 02/16/2014   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   Hammer toe, acquired 02/16/2014   CAD S/P percutaneous coronary angioplasty -  01/24/2014   Cardiac murmur 02/21/2011   Bilateral lower extremity edema 12/24/2010   Vitamin B12 deficiency 07/19/2010   Anemia due to vitamin B12 deficiency 10/31/2009   Trigger finger 10/16/2009   Dyslipidemia 03/01/2007   Gout 03/01/2007   Essential hypertension 03/01/2007   Gastroesophageal reflux disease 03/01/2007   Past Surgical History:  Procedure Laterality Date   AMPUTATION Left 07/25/2022   Procedure: AMPUTATION LEFT FOOT THROUGH MIDDLE OF FOOT;  Surgeon: Nadara Mustard, MD;  Location: [facility] OR;  Service: Orthopedics;  Laterality: Left;   BICEPS TENDON REPAIR  Nov 13 2010   right   HAND SURGERY  1990   left   LEFT HEART CATH N/A 05/12/2013   Procedure: LEFT HEART CATH;  Surgeon: Lesleigh Noe, MD;  Location: [facility] CATH LAB;  Service: Cardiovascular;  Laterality: N/A;   LEFT HEART CATHETERIZATION WITH CORONARY ANGIOGRAM N/A 01/24/2014   Procedure: LEFT HEART CATHETERIZATION WITH CORONARY ANGIOGRAM;  Surgeon: Peter M Swaziland, MD;  Location: [facility] CATH LAB;  Service: Cardiovascular;  Laterality: N/A;   PERCUTANEOUS CORONARY STENT INTERVENTION (PCI-S)  05/2013   mCx 3.68mm x 20 mm Promus P DES   PERCUTANEOUS CORONARY STENT INTERVENTION (PCI-S)  05/12/2013   Procedure: PERCUTANEOUS CORONARY  STENT INTERVENTION (PCI-S);  Surgeon: Lesleigh Noe, MD;  Location: [facility] CATH LAB;  Service: Cardiovascular;;   Family History  Problem Relation Age of Onset   Heart attack Father        deceased   Diabetes Father    Congestive Heart Failure Father    Stroke Mother    Pancreatic cancer Maternal Aunt     Colon cancer Neg Hx    Outpatient Medications Prior to Visit  Medication Sig Dispense Refill   acetaminophen (TYLENOL) 325 MG tablet Take 2 tablets (650 mg total) by mouth every 4 (four) hours as needed for headache or mild pain.     allopurinol (ZYLOPRIM) 100 MG tablet TAKE 1 TABLET(100 MG) BY MOUTH DAILY 90 tablet 0   alum & mag hydroxide-simeth (MAALOX/MYLANTA) 200-200-20 MG/5ML suspension Take 15-30 mLs by mouth every 2 (two) hours as needed for indigestion. 355 mL 0   amLODipine (NORVASC) 10 MG tablet TAKE 1 TABLET(10 MG) BY MOUTH DAILY 90 tablet 3   aspirin 81 MG chewable tablet Chew 1 tablet (81 mg total) by mouth daily. (Patient taking differently: Chew 81 mg by mouth daily at 12 noon.)     bisacodyl (DULCOLAX) 5 MG EC tablet Take 1 tablet (5 mg total) by mouth daily as needed for moderate constipation. 30 tablet 0   docusate sodium (COLACE) 100 MG capsule Take 1 capsule (100 mg total) by mouth daily. 10 capsule 0   Evolocumab (REPATHA SURECLICK) 140 MG/ML SOAJ Inject 140 mg into the skin every 14 (fourteen) days. 6 mL 3   FARXIGA 5 MG TABS tablet TAKE 1 TABLET(5 MG) BY MOUTH DAILY BREAKFAST 90 tablet 3   fluticasone (FLONASE) 50 MCG/ACT nasal spray Place 1 spray into both nostrils daily as needed for allergies or rhinitis. 18.2 mL 6   furosemide (LASIX) 20 MG tablet Take 1 tablet (20 mg total) by mouth daily. 30 tablet 3   gabapentin (NEURONTIN) 300 MG capsule Take 1 capsule (300 mg total) by mouth 3 (three) times daily. 90 capsule 3   Glycerin-Polysorbate 80 (REFRESH DRY EYE THERAPY OP) Place 1 drop into both eyes 3 (three) times daily as needed (for dryness).     hydrocortisone (ANUSOL-HC) 25 MG suppository Place 1 suppository (25 mg total) rectally 2 (two) times daily. 12 suppository 0   insulin degludec (TRESIBA FLEXTOUCH) 100 UNIT/ML FlexTouch Pen Inject 30 Units into the skin 2 (two) times daily. 45 mL 5   Insulin Pen Needle 31G X 8 MM MISC 1 Device by Does not apply route in the  morning, at noon, in the evening, and at bedtime. 400 each 2   ketoconazole (NIZORAL) 2 % cream Apply 1 Application topically daily. 60 g 2   losartan (COZAAR) 50 MG tablet Take 1 tablet (50 mg total) by mouth 2 (two) times daily. 180 tablet 3   nebivolol (BYSTOLIC) 5 MG tablet NEW PRESCRIPTION REQUEST: TAKE ONE TABLET BY MOUTH DAILY 90 tablet 3   nitroGLYCERIN (NITROSTAT) 0.4 MG SL tablet DISSOLVE 1 TABLET UNDER THE TONGUE EVERY 5 MINUTES AS NEEDED FOR CHEST PAIN. DO NOT EXCEED A TOTAL OF 3 DOSES IN 15 MINUTES. 25 tablet 2   OZEMPIC, 2 MG/DOSE, 8 MG/3ML SOPN Inject into the skin.     polyethylene glycol (MIRALAX / GLYCOLAX) 17 g packet Take 17 g by mouth 2 (two) times daily. 14 each 0   rosuvastatin (CRESTOR) 40 MG tablet NEW PRESCRIPTION REQUEST: TAKE ONE TABLET BY MOUTH DAILY 90 tablet 3  tadalafil (CIALIS) 20 MG tablet Take 20 mg by mouth daily as needed.     vitamin B-12 (CYANOCOBALAMIN) 500 MCG tablet NEW PRESCRIPTION REQUEST: TAKE ONE TABLET BY MOUTH DAILY 90 tablet 3   No facility-administered medications prior to visit.   Allergies  Allergen Reactions   Influenza Vaccines Anaphylaxis    Reaction June 2017   Influenza Virus Vaccine H5n1 Anaphylaxis   Fenofibrate Other (See Comments)    Hot flashes   Lisinopril Cough   Metformin Other (See Comments)    Chest discomfort   Prednisone Other (See Comments)    Mood swings   Augmentin [Amoxicillin-Pot Clavulanate] Rash    07/2022 tolerated cefazolin   Latex Rash     Objective:   Today's Vitals   06/23/23 1558  BP: (!) 144/86  Pulse: 82  Temp: 98.7 F (37.1 C)  TempSrc: Temporal  SpO2: 99%  Weight: 295 lb 12.8 oz (134.2 kg)  Height: 6' (1.829 m)   Body mass index is 40.12 kg/m.   General: Well developed, well nourished. No acute distress. Mouth: There is a deep cavity in the biting surface of the right lower rear molar. There is some redness and mild   swelling to the gum surrounding the base of this tooth. Psych:  Alert and oriented. Normal mood and affect.  Health Maintenance Due  Topic Date Due   Zoster Vaccines- Shingrix (1 of 2) Never done   DTaP/Tdap/Td (4 - Td or Tdap) 10/10/2022     Assessment & Plan:   Problem List Items Addressed This Visit   None Visit Diagnoses     Dental caries extending into dentin    -  Primary   As there does seem to be some infection present, I will treat with a course of antibiotics. I will provide T#3 for pain. Urge dental follow-up.   Relevant Medications   cephALEXin (KEFLEX) 500 MG capsule   acetaminophen-codeine (TYLENOL #3) 300-30 MG tablet       Return if symptoms worsen or fail to improve.   Loyola Mast, MD

## 2023-06-23 NOTE — Telephone Encounter (Signed)
Pharmacy Patient Advocate Encounter   Received notification from Fax that prior authorization for REPATHA  is required/requested.   Insurance verification completed.   The patient is insured through [facility] [employer].   PA required; PA submitted to [employer] via CoverMyMeds Key/confirmation #/EOC [id number]      Status is pending

## 2023-06-24 ENCOUNTER — Telehealth: Payer: Self-pay

## 2023-06-24 ENCOUNTER — Telehealth: Payer: Self-pay | Admitting: Cardiology

## 2023-06-24 NOTE — Telephone Encounter (Signed)
Pt's spouse is requesting a callback regarding pt's needing premedication since he has a stent before having his tooth extraction and would like to speak with nurse in further detail. Please advise

## 2023-06-24 NOTE — Telephone Encounter (Signed)
   Name: Aaron Franco  DOB: 11/02/1971  MRN: [id number]  Primary Cardiologist: Rollene Rotunda, MD  Chart reviewed as part of pre-operative protocol coverage. Because of [name]'s past medical history and time since last visit, he will require a follow-up telephone visit in order to better assess preoperative cardiovascular risk.  Pre-op covering staff: - Please schedule appointment and call patient to inform them. If patient already had an upcoming appointment within acceptable timeframe, please add "pre-op clearance" to the appointment notes so provider is aware. - Please contact requesting surgeon's office via preferred method (i.e, phone, fax) to inform them of need for appointment prior to surgery.  Patient with a history of PCI to the circumflex back in 2014.  Is on a daily aspirin 81 mg.  If bleeding risk is too high, can hold ASA 81 mg 5 to 7 days prior to surgery.  Please resume medically safe to do so.   Sharlene Dory, PA-C  06/24/2023, 2:38 PM

## 2023-06-24 NOTE — Telephone Encounter (Signed)
Wife called back to note patient's dental appointment is this morning (8/14) at 10:30am

## 2023-06-24 NOTE — Telephone Encounter (Signed)
Pharmacy Patient Advocate Encounter  Received notification from  [facility] [employer]  that Prior Authorization for REPATHA has been  APPROVED FROM 7.14.24 TO 06/22/24

## 2023-06-24 NOTE — Telephone Encounter (Signed)
Returned pt call to pt Aaron Franco and she wanting to know if pt needs an ATB prior to his dental appt today. Stent was placed in 2014 per wife. Spoke with Dr. Flora Lipps and per his advice, no ATB is needing for a stent prior to his appt.

## 2023-06-24 NOTE — Telephone Encounter (Deleted)
   Pre-operative Risk Assessment    Patient Name: Aaron Franco  DOB: [birth year] MRN: [id number]      Request for Surgical Clearance    Procedure:   EVAR  Date of Surgery:  Clearance TBD                                 Surgeon:  Dr. Devota Pace Group or Practice Name:  [facility] Phone number:  [phone] Fax number:  [phone]   Type of Clearance Requested:   - Medical    Type of Anesthesia:  General    Additional requests/questions:    Garrel Ridgel   06/24/2023, 1:30 PM

## 2023-06-24 NOTE — Telephone Encounter (Signed)
This encounter was created in error - please disregard.

## 2023-06-29 NOTE — Telephone Encounter (Signed)
Two ROI forms placed in the FO for patient to complete and sign. Please provide the patient with a copy of the forms once completed.

## 2023-07-16 ENCOUNTER — Telehealth: Payer: Self-pay | Admitting: *Deleted

## 2023-07-16 NOTE — Telephone Encounter (Signed)
   Pre-operative Risk Assessment    Patient Name: Aaron Franco  DOB: 1971/09/07 MRN: [id number]    DATE OF LAST VISIT: 11/21/22 DR. HOCHREIN DATE OF NEXT VISIT: 09/11/23 DR. HOCHRIEN Request for Surgical Clearance    Procedure:   PLAN AT THIS TME IS JUST FOR A FULL DENTAL EXAM, INCLUDING DENTAL X-RAYS. PT WILL BE HAVING DENTAL EXTRACTIONS IN THE NEAR FUTURE WHICH WILL REQUIRE A NEW CLEARANCE REQUEST TO BE FAXED T OUR OFFICE WHEN READY FOR EXTRACTIONS.   Date of Surgery:  Clearance TBD                                 Surgeon:  DR. Lynnea Maizes Surgeon's Group or Practice Name:  [facility] Phone number:  [phone] Fax number:  [phone]   Type of Clearance Requested:   - Medical ; ASA ALSO DDS IS ASKING IF THE PT WILL REQUIRE SBE FOR ANY DENTAL PROCEDURES   Type of Anesthesia: DDS ASKING IS IT OK TO USE LOCAL WITH EPI OR MAY EVEN NEED GENERAL POSSIBLE IN THE NEAR FUTURE  6. Are there any other requests or questions from the surgeon?    :1}  Additional requests/questions:    Aaron Franco   07/16/2023, 4:06 PM

## 2023-07-17 ENCOUNTER — Encounter: Payer: Self-pay | Admitting: Family Medicine

## 2023-07-17 NOTE — Telephone Encounter (Signed)
   Patient Name: Aaron Franco  DOB: 1971-06-11 MRN: [id number]  Primary Cardiologist: Rollene Rotunda, MD  Chart reviewed as part of pre-operative protocol coverage.   IF SIMPLE EXTRACTION/CLEANINGS: Simple dental extractions (i.e. 1-2 teeth) are considered low risk procedures per guidelines and generally do not require any specific cardiac clearance. It is also generally accepted that for simple extractions and dental cleanings, there is no need to interrupt blood thinner therapy.  IF MULTIPLE EXTRACTIONS OR COMPLEX DENTAL PROCEDURES: (follow standard pre-op clearance process). The patient was advised that if he develops new symptoms prior to surgery to contact our office to arrange for a follow-up visit, and he verbalized understanding.  SBE prophylaxis is not required for the patient from a cardiac standpoint.  Please call with questions.  Joni Reining, NP 07/17/2023, 8:56 AM

## 2023-07-20 ENCOUNTER — Telehealth: Payer: Self-pay | Admitting: Family Medicine

## 2023-07-20 DIAGNOSIS — Z0279 Encounter for issue of other medical certificate: Secondary | ICD-10-CM

## 2023-07-20 NOTE — Telephone Encounter (Signed)
Form on providers desk t be filled out and then faxed. Dm/cma

## 2023-07-20 NOTE — Telephone Encounter (Signed)
Form filled out/faxed to [facility] @ [phone], then given to [facility] for billing.  Dm/cma

## 2023-07-20 NOTE — Telephone Encounter (Signed)
07/20/23 - Disability form was faxed in for the pt to be filled out by provider and faxed back to Eliezer Champagne, Benefit manager at [facility] at [phone] after completion.

## 2023-07-20 NOTE — Telephone Encounter (Signed)
Form printed and placed on providers desk. Dm/cma

## 2023-07-29 ENCOUNTER — Encounter: Payer: Self-pay | Admitting: Family Medicine

## 2023-07-30 ENCOUNTER — Other Ambulatory Visit: Payer: Self-pay

## 2023-07-30 MED ORDER — DEXLANSOPRAZOLE 60 MG PO CPDR: 60.0000 mg | DELAYED_RELEASE_CAPSULE | Freq: Every day | ORAL | 1 refills | Status: DC

## 2023-08-03 DIAGNOSIS — H25013 Cortical age-related cataract, bilateral: Secondary | ICD-10-CM | POA: Diagnosis not present

## 2023-08-03 DIAGNOSIS — H25043 Posterior subcapsular polar age-related cataract, bilateral: Secondary | ICD-10-CM | POA: Diagnosis not present

## 2023-08-03 DIAGNOSIS — H35043 Retinal micro-aneurysms, unspecified, bilateral: Secondary | ICD-10-CM | POA: Diagnosis not present

## 2023-08-03 DIAGNOSIS — E113293 Type 2 diabetes mellitus with mild nonproliferative diabetic retinopathy without macular edema, bilateral: Secondary | ICD-10-CM | POA: Diagnosis not present

## 2023-08-03 DIAGNOSIS — H2513 Age-related nuclear cataract, bilateral: Secondary | ICD-10-CM | POA: Diagnosis not present

## 2023-08-11 ENCOUNTER — Encounter: Payer: Self-pay | Admitting: Family Medicine

## 2023-08-11 ENCOUNTER — Ambulatory Visit (INDEPENDENT_AMBULATORY_CARE_PROVIDER_SITE_OTHER): Payer: BC Managed Care – PPO | Admitting: Family Medicine

## 2023-08-11 VITALS — BP 154/86 | HR 91 | Temp 98.3°F | Ht 72.0 in | Wt 302.4 lb

## 2023-08-11 DIAGNOSIS — Z7984 Long term (current) use of oral hypoglycemic drugs: Secondary | ICD-10-CM

## 2023-08-11 DIAGNOSIS — G629 Polyneuropathy, unspecified: Secondary | ICD-10-CM

## 2023-08-11 DIAGNOSIS — I10 Essential (primary) hypertension: Secondary | ICD-10-CM | POA: Diagnosis not present

## 2023-08-11 DIAGNOSIS — N181 Chronic kidney disease, stage 1: Secondary | ICD-10-CM

## 2023-08-11 DIAGNOSIS — Z794 Long term (current) use of insulin: Secondary | ICD-10-CM

## 2023-08-11 DIAGNOSIS — E1122 Type 2 diabetes mellitus with diabetic chronic kidney disease: Secondary | ICD-10-CM

## 2023-08-11 DIAGNOSIS — D519 Vitamin B12 deficiency anemia, unspecified: Secondary | ICD-10-CM | POA: Diagnosis not present

## 2023-08-11 DIAGNOSIS — Z7985 Long-term (current) use of injectable non-insulin antidiabetic drugs: Secondary | ICD-10-CM

## 2023-08-11 DIAGNOSIS — E538 Deficiency of other specified B group vitamins: Secondary | ICD-10-CM

## 2023-08-11 DIAGNOSIS — I2510 Atherosclerotic heart disease of native coronary artery without angina pectoris: Secondary | ICD-10-CM

## 2023-08-11 DIAGNOSIS — R600 Localized edema: Secondary | ICD-10-CM

## 2023-08-11 MED ORDER — LOSARTAN POTASSIUM-HCTZ 50-12.5 MG PO TABS: 1.0000 | ORAL_TABLET | Freq: Two times a day (BID) | ORAL | 3 refills | Status: DC

## 2023-08-11 MED ORDER — VITAMIN B-12 500 MCG PO TABS
1000.0000 ug | ORAL_TABLET | Freq: Every day | ORAL | 3 refills | Status: DC
Start: 2023-08-11 — End: 2024-07-15

## 2023-08-11 MED ORDER — FUROSEMIDE 20 MG PO TABS
20.0000 mg | ORAL_TABLET | Freq: Every day | ORAL | 3 refills | Status: DC
Start: 2023-08-11 — End: 2023-09-25

## 2023-08-11 NOTE — Assessment & Plan Note (Signed)
I will renew his Vitamin B12.

## 2023-08-11 NOTE — Assessment & Plan Note (Signed)
He is currently on a SGLT2i and ARB. Continue focus on blood pressure and glucose control, adequate hydration, and avoidance of nephrotoxic medications.

## 2023-08-11 NOTE — Assessment & Plan Note (Signed)
Continue to follow with Ms. Aaron Franco. Continue dapagliflozin (Farxiga) 5 mg daily, insulin degludec 30 units bid, and semaglutide (Ozempic) 2 mg weekly. I will check his A1c and blood sugar.

## 2023-08-11 NOTE — Assessment & Plan Note (Signed)
I will reassess his CBC today.

## 2023-08-11 NOTE — Progress Notes (Signed)
[facility] [address] [place] [address] Dept: [phone] Dept Fax: [phone]  Chronic Care Office Visit  Subjective:    Patient ID: Aaron Franco, male    DOB: [birth year], 52 y.o..   MRN: [id number]  Chief Complaint  Patient presents with   Hypertension    3 month f/u.  Average BS 140-160 at home.    History of Present Illness:  Patient is in today for reassessment of chronic medical issues.  Aaron Franco had a left transmetatarsal amputation secondary to a diabetic foot infection in mid-Sept. 2023. He is fully healed at this point. He has some chronic edema of both lower legs, L>R.    Aaron Franco has a history of hypertension. He is on amlodipine 10 mg daily, nebivolol 5 mg daily, and losartan 50 mg twice daily.    Aaron Franco has had issues with uncontrolled diabetes. He is followed by Sonny Dandy, NP (endocrinology). He continues to be on dapagliflozin Marcelline Deist) 5 mg daily, insulin degludec 30 units bid, and semaglutide (Ozempic) 2 mg weekly. He last A1c was 7.9% (06/11/2023). he did go for a recent eye exam. He has no evidence fo diabetic retinopathy. However, he does have cataracts, so will be having surgery for their removal.  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 without macular edema, with long-term current use of insulin (HCC) 09/09/2022   S/P transmetatarsal amputation of foot, left (HCC) 08/18/2022   Normocytic anemia 07/24/2022   Diabetic foot infection (HCC)  07/23/2022   Chronic venous insufficiency of lower extremity 01/23/2021   Allergic rhinitis due to pollen 06/29/2020   Diabetic neuropathy (HCC) 03/13/2020   Erectile dysfunction due to arterial insufficiency 03/13/2020   Coronary artery disease involving native heart without angina pectoris 03/29/2019   Onychomycosis 12/01/2017   Obesity (BMI 30-39.9) 02/18/2016   Chest pain with moderate risk of acute coronary syndrome 09/17/2015   Type 2 diabetes mellitus with diabetic neuropathy, unspecified (HCC) 04/23/2015   Thrush 04/23/2015   Type 2 diabetes 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, with long-term current use of insulin (HCC) 02/16/2014   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   Hammer toe, acquired 02/16/2014   CAD S/P percutaneous coronary angioplasty -  01/24/2014   Cardiac murmur 02/21/2011   Bilateral lower extremity edema 12/24/2010   Vitamin B12 deficiency 07/19/2010   Anemia due to vitamin B12 deficiency 10/31/2009   Trigger finger 10/16/2009   Dyslipidemia 03/01/2007   Gout 03/01/2007   Essential hypertension 03/01/2007   Gastroesophageal reflux disease 03/01/2007   Past Surgical History:  Procedure Laterality Date   AMPUTATION Left 07/25/2022   Procedure: AMPUTATION LEFT FOOT THROUGH MIDDLE OF FOOT;  Surgeon: Nadara Mustard, MD;  Location: [facility] OR;  Service: Orthopedics;  Laterality: Left;   BICEPS TENDON REPAIR  Nov 13 2010   right   HAND SURGERY  1990   left   LEFT HEART CATH N/A 05/12/2013   Procedure: LEFT HEART CATH;  Surgeon: Lesleigh Noe, MD;  Location: [facility] CATH LAB;  Service: Cardiovascular;  Laterality: N/A;   LEFT  HEART CATHETERIZATION WITH CORONARY ANGIOGRAM N/A 01/24/2014   Procedure: LEFT HEART CATHETERIZATION WITH CORONARY ANGIOGRAM;  Surgeon: Peter M Swaziland, MD;  Location: [facility] CATH LAB;  Service: Cardiovascular;  Laterality: N/A;   PERCUTANEOUS CORONARY STENT INTERVENTION (PCI-S)  05/2013   mCx 3.86mm x 20 mm Promus P DES   PERCUTANEOUS CORONARY  STENT INTERVENTION (PCI-S)  05/12/2013   Procedure: PERCUTANEOUS CORONARY STENT INTERVENTION (PCI-S);  Surgeon: Lesleigh Noe, MD;  Location: [facility] CATH LAB;  Service: Cardiovascular;;   Family History  Problem Relation Age of Onset   Heart attack Father        deceased   Diabetes Father    Congestive Heart Failure Father    Stroke Mother    Pancreatic cancer Maternal Aunt    Colon cancer Neg Hx    Outpatient Medications Prior to Visit  Medication Sig Dispense Refill   acetaminophen (TYLENOL) 325 MG tablet Take 2 tablets (650 mg total) by mouth every 4 (four) hours as needed for headache or mild pain.     acetaminophen-codeine (TYLENOL #3) 300-30 MG tablet Take 1 tablet by mouth every 4 (four) hours as needed for moderate pain. 15 tablet 0   allopurinol (ZYLOPRIM) 100 MG tablet TAKE 1 TABLET(100 MG) BY MOUTH DAILY 90 tablet 0   alum & mag hydroxide-simeth (MAALOX/MYLANTA) 200-200-20 MG/5ML suspension Take 15-30 mLs by mouth every 2 (two) hours as needed for indigestion. 355 mL 0   amLODipine (NORVASC) 10 MG tablet TAKE 1 TABLET(10 MG) BY MOUTH DAILY 90 tablet 3   aspirin 81 MG chewable tablet Chew 1 tablet (81 mg total) by mouth daily. (Patient taking differently: Chew 81 mg by mouth daily at 12 noon.)     bisacodyl (DULCOLAX) 5 MG EC tablet Take 1 tablet (5 mg total) by mouth daily as needed for moderate constipation. 30 tablet 0   dexlansoprazole (DEXILANT) 60 MG capsule Take 1 capsule (60 mg total) by mouth daily. 90 capsule 1   docusate sodium (COLACE) 100 MG capsule Take 1 capsule (100 mg total) by mouth daily. 10 capsule 0   Evolocumab (REPATHA SURECLICK) 140 MG/ML SOAJ Inject 140 mg into the skin every 14 (fourteen) days. 6 mL 3   FARXIGA 5 MG TABS tablet TAKE 1 TABLET(5 MG) BY MOUTH DAILY BREAKFAST 90 tablet 3   fluticasone (FLONASE) 50 MCG/ACT nasal spray Place 1 spray into both nostrils daily as needed for allergies or rhinitis. 18.2 mL 6   furosemide (LASIX) 20 MG tablet Take 1  tablet (20 mg total) by mouth daily. 30 tablet 3   gabapentin (NEURONTIN) 300 MG capsule Take 1 capsule (300 mg total) by mouth 3 (three) times daily. 90 capsule 3   Glycerin-Polysorbate 80 (REFRESH DRY EYE THERAPY OP) Place 1 drop into both eyes 3 (three) times daily as needed (for dryness).     hydrocortisone (ANUSOL-HC) 25 MG suppository Place 1 suppository (25 mg total) rectally 2 (two) times daily. 12 suppository 0   insulin degludec (TRESIBA FLEXTOUCH) 100 UNIT/ML FlexTouch Pen Inject 30 Units into the skin 2 (two) times daily. 45 mL 5   Insulin Pen Needle 31G X 8 MM MISC 1 Device by Does not apply route in the morning, at noon, in the evening, and at bedtime. 400 each 2   ketoconazole (NIZORAL) 2 % cream Apply 1 Application topically daily. 60 g 2   nebivolol (BYSTOLIC) 5 MG tablet NEW PRESCRIPTION REQUEST: TAKE ONE TABLET BY MOUTH DAILY 90 tablet 3   nitroGLYCERIN (  NITROSTAT) 0.4 MG SL tablet DISSOLVE 1 TABLET UNDER THE TONGUE EVERY 5 MINUTES AS NEEDED FOR CHEST PAIN. DO NOT EXCEED A TOTAL OF 3 DOSES IN 15 MINUTES. 25 tablet 2   OZEMPIC, 2 MG/DOSE, 8 MG/3ML SOPN Inject into the skin.     polyethylene glycol (MIRALAX / GLYCOLAX) 17 g packet Take 17 g by mouth 2 (two) times daily. 14 each 0   rosuvastatin (CRESTOR) 40 MG tablet NEW PRESCRIPTION REQUEST: TAKE ONE TABLET BY MOUTH DAILY 90 tablet 3   tadalafil (CIALIS) 20 MG tablet Take 20 mg by mouth daily as needed.     losartan (COZAAR) 50 MG tablet Take 1 tablet (50 mg total) by mouth 2 (two) times daily. 180 tablet 3   cephALEXin (KEFLEX) 500 MG capsule Take 1 capsule (500 mg total) by mouth 4 (four) times daily. 28 capsule 0   vitamin B-12 (CYANOCOBALAMIN) 500 MCG tablet NEW PRESCRIPTION REQUEST: TAKE ONE TABLET BY MOUTH DAILY (Patient not taking: Reported on 08/11/2023) 90 tablet 3   No facility-administered medications prior to visit.   Allergies  Allergen Reactions   Influenza Vaccines Anaphylaxis    Reaction June 2017   Influenza  Virus Vaccine [id number] Anaphylaxis   Fenofibrate Other (See Comments)    Hot flashes   Lisinopril Cough   Metformin Other (See Comments)    Chest discomfort   Prednisone Other (See Comments)    Mood swings   Augmentin [Amoxicillin-Pot Clavulanate] Rash    07/2022 tolerated cefazolin   Latex Rash   Objective:   Today's Vitals   08/11/23 1540  BP: (!) 150/92  Pulse: 91  Temp: 98.3 F (36.8 C)  TempSrc: Temporal  SpO2: 99%  Weight: (!) 302 lb 6.4 oz (137.2 kg)  Height: 6' (1.829 m)   Body mass index is 41.01 kg/m.   General: Well developed, well nourished. No acute distress. Psych: Alert and oriented. Normal mood and affect.  Health Maintenance Due  Topic Date Due   DTaP/Tdap/Td (4 - Td or Tdap) 10/10/2022     Assessment & Plan:   Problem List Items Addressed This Visit       Cardiovascular and Mediastinum   Essential hypertension (Chronic)    Blood pressure continues to be high in the office. Continue amlodipine 10 mg daily and nebivolol 5 mg daily. I will try switching his losartan to losartan-HCTZ 50-12.5 bid. If BP does not meet goals with this, I would consider addition of spironolactone.       Relevant Medications   losartan-hydrochlorothiazide (HYZAAR) 50-12.5 MG tablet   furosemide (LASIX) 20 MG tablet   Coronary artery disease involving native heart without angina pectoris - Primary    Stable. No angina. Continue nebivolol 5 mg daily, aspirin 81 mg daily and dapagliflozin 5 mg daily.      Relevant Medications   losartan-hydrochlorothiazide (HYZAAR) 50-12.5 MG tablet   furosemide (LASIX) 20 MG tablet     Endocrine   Type 2 diabetes 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, with long-term current use of insulin (HCC)    Continue to follow with Ms. Ricki Miller. Continue dapagliflozin (Farxiga) 5 mg daily, insulin degludec 30 units bid, and semaglutide (Ozempic) 2 mg weekly. I will check his A1c and blood sugar.       Relevant Medications    losartan-hydrochlorothiazide (HYZAAR) 50-12.5 MG tablet     Genitourinary   Chronic kidney disease (CKD) stage G1/A2, glomerular filtration rate (GFR) equal to or greater than 90 mL/min/1.73 square meter and  albuminuria creatinine ratio between 30-299 mg/g    He is currently on a SGLT2i and ARB. Continue focus on blood pressure and glucose control, adequate hydration, and avoidance of nephrotoxic medications.        Other   Anemia due to vitamin B12 deficiency    I will reassess his CBC today.      Relevant Medications   vitamin B-12 (CYANOCOBALAMIN) 500 MCG tablet   Other Relevant Orders   CBC   Bilateral lower extremity edema    Continue furosemide as needed.      Relevant Medications   furosemide (LASIX) 20 MG tablet   Vitamin B12 deficiency    I will renew his Vitamin B12.      Other Visit Diagnoses     Neuropathy       Relevant Medications   vitamin B-12 (CYANOCOBALAMIN) 500 MCG tablet       Return in about 3 months (around 11/11/2023) for Reassessment.   Loyola Mast, MD

## 2023-08-11 NOTE — Assessment & Plan Note (Signed)
Blood pressure continues to be high in the office. Continue amlodipine 10 mg daily and nebivolol 5 mg daily. I will try switching his losartan to losartan-HCTZ 50-12.5 bid. If BP does not meet goals with this, I would consider addition of spironolactone.

## 2023-08-11 NOTE — Assessment & Plan Note (Signed)
Continue furosemide as needed.

## 2023-08-11 NOTE — Assessment & Plan Note (Addendum)
Stable. No angina. Continue nebivolol 5 mg daily, aspirin 81 mg daily and dapagliflozin 5 mg daily.

## 2023-08-12 LAB — CBC
HCT: 43.2 % (ref 39.0–52.0)
Hemoglobin: 14.1 g/dL (ref 13.0–17.0)
MCHC: 32.5 g/dL (ref 30.0–36.0)
MCV: 78.3 fL (ref 78.0–100.0)
Platelets: 309 10*3/uL (ref 150.0–400.0)
RBC: 5.52 Mil/uL (ref 4.22–5.81)
RDW: 15.5 % (ref 11.5–15.5)
WBC: 7.8 10*3/uL (ref 4.0–10.5)

## 2023-08-25 DIAGNOSIS — H35042 Retinal micro-aneurysms, unspecified, left eye: Secondary | ICD-10-CM | POA: Diagnosis not present

## 2023-08-25 DIAGNOSIS — H25043 Posterior subcapsular polar age-related cataract, bilateral: Secondary | ICD-10-CM | POA: Diagnosis not present

## 2023-08-25 DIAGNOSIS — H04123 Dry eye syndrome of bilateral lacrimal glands: Secondary | ICD-10-CM | POA: Diagnosis not present

## 2023-08-25 DIAGNOSIS — H2513 Age-related nuclear cataract, bilateral: Secondary | ICD-10-CM | POA: Diagnosis not present

## 2023-08-25 DIAGNOSIS — E113293 Type 2 diabetes mellitus with mild nonproliferative diabetic retinopathy without macular edema, bilateral: Secondary | ICD-10-CM | POA: Diagnosis not present

## 2023-09-09 DIAGNOSIS — H2512 Age-related nuclear cataract, left eye: Secondary | ICD-10-CM | POA: Diagnosis not present

## 2023-09-09 DIAGNOSIS — H25812 Combined forms of age-related cataract, left eye: Secondary | ICD-10-CM | POA: Diagnosis not present

## 2023-09-09 DIAGNOSIS — H25811 Combined forms of age-related cataract, right eye: Secondary | ICD-10-CM | POA: Diagnosis not present

## 2023-09-11 ENCOUNTER — Ambulatory Visit: Payer: BC Managed Care – PPO | Attending: Cardiology | Admitting: Cardiology

## 2023-09-11 ENCOUNTER — Encounter: Payer: Self-pay | Admitting: Cardiology

## 2023-09-11 VITALS — BP 134/86 | HR 91 | Ht 72.0 in | Wt 303.2 lb

## 2023-09-11 DIAGNOSIS — I2510 Atherosclerotic heart disease of native coronary artery without angina pectoris: Secondary | ICD-10-CM | POA: Diagnosis not present

## 2023-09-11 DIAGNOSIS — Z9861 Coronary angioplasty status: Secondary | ICD-10-CM

## 2023-09-11 DIAGNOSIS — E785 Hyperlipidemia, unspecified: Secondary | ICD-10-CM

## 2023-09-11 DIAGNOSIS — I10 Essential (primary) hypertension: Secondary | ICD-10-CM

## 2023-09-11 DIAGNOSIS — E118 Type 2 diabetes mellitus with unspecified complications: Secondary | ICD-10-CM | POA: Diagnosis not present

## 2023-09-11 DIAGNOSIS — I350 Nonrheumatic aortic (valve) stenosis: Secondary | ICD-10-CM

## 2023-09-11 NOTE — Patient Instructions (Signed)
  Lab Work: FASTING Lipid. If you have labs (blood work) drawn today and your tests are completely normal, you will receive your results only by: MyChart Message (if you have MyChart) OR A paper copy in the mail If you have any lab test that is abnormal or we need to change your treatment, we will call you to review the results.   Testing/Procedures: Your physician has requested that you have an echocardiogram end of September 2025.Echocardiography is a painless test that uses sound waves to create images of your heart. It provides your doctor with information about the size and shape of your heart and how well your heart's chambers and valves are working. This procedure takes approximately one hour. There are no restrictions for this procedure.  [address] Please do NOT wear cologne, perfume, aftershave, or lotions (deodorant is allowed). Please arrive 15 minutes prior to your appointment time.  Please note: We ask at that you not bring children with you during ultrasound (echo/ vascular) testing. Due to room size and safety concerns, children are not allowed in the ultrasound rooms during exams. Our front office staff cannot provide observation of children in our lobby area while testing is being conducted. An adult accompanying a patient to their appointment will only be allowed in the ultrasound room at the discretion of the ultrasound technician under special circumstances. We apologize for any inconvenience.    Follow-Up: At [facility], you and your health needs are our priority.  As part of our continuing mission to provide you with exceptional heart care, we have created designated Provider Care Teams.  These Care Teams include your primary Cardiologist (physician) and Advanced Practice Providers (APPs -  Physician Assistants and Nurse Practitioners) who all work together to provide you with the care you need, when you need it.  We recommend signing up for the patient  portal called "MyChart".  Sign up information is provided on this After Visit Summary.  MyChart is used to connect with patients for Virtual Visits (Telemedicine).  Patients are able to view lab/test results, encounter notes, upcoming appointments, etc.  Non-urgent messages can be sent to your provider as well.   To learn more about what you can do with MyChart, go to [email or web address].    Your next appointment:   12 month(s)  Provider:   Rollene Rotunda, MD

## 2023-09-12 ENCOUNTER — Encounter: Payer: Self-pay | Admitting: Cardiology

## 2023-09-15 DIAGNOSIS — Z794 Long term (current) use of insulin: Secondary | ICD-10-CM | POA: Diagnosis not present

## 2023-09-15 DIAGNOSIS — E1165 Type 2 diabetes mellitus with hyperglycemia: Secondary | ICD-10-CM | POA: Diagnosis not present

## 2023-09-23 ENCOUNTER — Ambulatory Visit (INDEPENDENT_AMBULATORY_CARE_PROVIDER_SITE_OTHER): Payer: BC Managed Care – PPO | Admitting: Podiatry

## 2023-09-23 ENCOUNTER — Encounter: Payer: Self-pay | Admitting: Family Medicine

## 2023-09-23 DIAGNOSIS — Z91199 Patient's noncompliance with other medical treatment and regimen due to unspecified reason: Secondary | ICD-10-CM

## 2023-09-24 ENCOUNTER — Encounter: Payer: Self-pay | Admitting: Cardiology

## 2023-09-24 DIAGNOSIS — Z0279 Encounter for issue of other medical certificate: Secondary | ICD-10-CM

## 2023-09-24 NOTE — Progress Notes (Signed)
1. No-show for appointment     

## 2023-09-25 ENCOUNTER — Ambulatory Visit (INDEPENDENT_AMBULATORY_CARE_PROVIDER_SITE_OTHER): Payer: BC Managed Care – PPO | Admitting: Family Medicine

## 2023-09-25 ENCOUNTER — Encounter: Payer: Self-pay | Admitting: Family Medicine

## 2023-09-25 VITALS — BP 130/82 | HR 65 | Temp 98.6°F | Ht 72.0 in | Wt 297.6 lb

## 2023-09-25 DIAGNOSIS — E1142 Type 2 diabetes mellitus with diabetic polyneuropathy: Secondary | ICD-10-CM

## 2023-09-25 DIAGNOSIS — I872 Venous insufficiency (chronic) (peripheral): Secondary | ICD-10-CM

## 2023-09-25 DIAGNOSIS — Z89432 Acquired absence of left foot: Secondary | ICD-10-CM | POA: Diagnosis not present

## 2023-09-25 DIAGNOSIS — I10 Essential (primary) hypertension: Secondary | ICD-10-CM | POA: Diagnosis not present

## 2023-09-25 MED ORDER — FUROSEMIDE 20 MG PO TABS: 20.0000 mg | ORAL_TABLET | Freq: Every day | ORAL | 3 refills | Status: AC

## 2023-09-25 MED ORDER — GABAPENTIN 600 MG PO TABS: 600.0000 mg | ORAL_TABLET | Freq: Every day | ORAL | 3 refills | Status: DC | PRN

## 2023-09-25 NOTE — Assessment & Plan Note (Signed)
Foot well healed and no sign of complications.

## 2023-09-25 NOTE — Assessment & Plan Note (Signed)
Patient asks for renewal of gabapentin, which has helped for intermittent use related to his lower leg pain.

## 2023-09-25 NOTE — Assessment & Plan Note (Signed)
Previously evaluated by vascular. Patient has not been wearing his compression stockings. I recommended he consider using the 20 mmHG stockings as advised by podiatry. I will renew his furosemide.

## 2023-09-25 NOTE — Assessment & Plan Note (Signed)
Blood pressure is in acceptable control. Continue amlodipine 10 mg daily, nebivolol 5 mg daily and losartan-HCTZ 50-12.5 bid.

## 2023-09-25 NOTE — Progress Notes (Signed)
[facility]IMARY [facility] [address] [place] [address] Dept: [phone] Dept Fax: [phone]  Chronic Care Office Visit  Subjective:    Patient ID: Aaron Franco, male    DOB: [birth year], 52 y.o..   MRN: [id number]  Chief Complaint  Patient presents with   Leg Pain    C/o still having swelling in both legs/feet and numbness.       History of Present Illness:  Patient is in today for reassessment of chronic medical issues.  Mr. Agricola had a left transmetatarsal amputation secondary to a diabetic foot infection in mid-Sept. 2023. He is fully healed at this point. He has some chronic edema of both lower legs, L>R. Prior evaluation by vascular surgery showed chronic venous insufficiency. Mr. Strike has had some increased edema of the left leg and remains concerned for any problems with that foot. He did see cardiology recently and is scheduled for an echocardiogram. He notes he is out of his furosemide prescription.   Mr. Kotz has a history of hypertension. He is on amlodipine 10 mg daily, nebivolol 5 mg daily, and losartan-HCTZ (Hyzaar) 50-12.5 mg twice daily.     Mr. Piere has had issues with uncontrolled diabetes, complicated by peripheral neuropathy. He is followed by Sonny Dandy, NP (endocrinology). His last A1c was 7.9% (06/11/2023), which is a considerable improvement. His neuropathy is managed on gabapentin 600 mg daily as needed.  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 without  macular edema, with long-term current use of insulin (HCC) 09/09/2022   S/P transmetatarsal amputation of foot, left (HCC) 08/18/2022   Normocytic anemia 07/24/2022   Diabetic foot infection (HCC) 07/23/2022   Chronic venous insufficiency of lower extremity 01/23/2021   Allergic rhinitis due to pollen 06/29/2020   Diabetic neuropathy (HCC) 03/13/2020   Erectile dysfunction due to arterial insufficiency 03/13/2020   Coronary artery disease involving native heart without angina pectoris 03/29/2019   Onychomycosis 12/01/2017   Obesity (BMI 30-39.9) 02/18/2016   Chest pain with moderate risk of acute coronary syndrome 09/17/2015   Type 2 diabetes mellitus with diabetic neuropathy, unspecified (HCC) 04/23/2015   Thrush 04/23/2015   Type 2 diabetes 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, with long-term current use of insulin (HCC) 02/16/2014   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   Hammer toe, acquired 02/16/2014   CAD S/P percutaneous coronary angioplasty -  01/24/2014   Cardiac murmur 02/21/2011   Bilateral lower extremity edema 12/24/2010   Vitamin B12 deficiency 07/19/2010   Anemia due to vitamin B12 deficiency 10/31/2009   Trigger finger 10/16/2009   Dyslipidemia 03/01/2007   Gout 03/01/2007   Essential hypertension 03/01/2007   Gastroesophageal reflux disease 03/01/2007   Past Surgical History:  Procedure Laterality Date   AMPUTATION Left 07/25/2022   Procedure: AMPUTATION LEFT FOOT THROUGH MIDDLE OF FOOT;  Surgeon: Nadara Mustard, MD;  [facility] OR;  Service: Orthopedics;  Laterality: Left;   BICEPS TENDON REPAIR  Nov 13 2010   right   HAND SURGERY  1990   left   LEFT HEART CATH N/A 05/12/2013  Procedure: LEFT HEART CATH;  Surgeon: Lesleigh Noe, MD;  Location: [facility] CATH LAB;  Service: Cardiovascular;  Laterality: N/A;   LEFT HEART CATHETERIZATION WITH CORONARY ANGIOGRAM N/A 01/24/2014   Procedure: LEFT HEART CATHETERIZATION WITH CORONARY ANGIOGRAM;  Surgeon: Peter M  Swaziland, MD;  Location: [facility] CATH LAB;  Service: Cardiovascular;  Laterality: N/A;   PERCUTANEOUS CORONARY STENT INTERVENTION (PCI-S)  05/2013   mCx 3.42mm x 20 mm Promus P DES   PERCUTANEOUS CORONARY STENT INTERVENTION (PCI-S)  05/12/2013   Procedure: PERCUTANEOUS CORONARY STENT INTERVENTION (PCI-S);  Surgeon: Lesleigh Noe, MD;  Location: [facility] CATH LAB;  Service: Cardiovascular;;   Family History  Problem Relation Age of Onset   Heart attack Father        deceased   Diabetes Father    Congestive Heart Failure Father    Stroke Mother    Pancreatic cancer Maternal Aunt    Colon cancer Neg Hx    Outpatient Medications Prior to Visit  Medication Sig Dispense Refill   acetaminophen (TYLENOL) 325 MG tablet Take 2 tablets (650 mg total) by mouth every 4 (four) hours as needed for headache or mild pain.     acetaminophen-codeine (TYLENOL #3) 300-30 MG tablet Take 1 tablet by mouth every 4 (four) hours as needed for moderate pain. 15 tablet 0   allopurinol (ZYLOPRIM) 100 MG tablet TAKE 1 TABLET(100 MG) BY MOUTH DAILY 90 tablet 0   alum & mag hydroxide-simeth (MAALOX/MYLANTA) 200-200-20 MG/5ML suspension Take 15-30 mLs by mouth every 2 (two) hours as needed for indigestion. 355 mL 0   amLODipine (NORVASC) 10 MG tablet TAKE 1 TABLET(10 MG) BY MOUTH DAILY 90 tablet 3   aspirin 81 MG chewable tablet Chew 1 tablet (81 mg total) by mouth daily. (Patient taking differently: Chew 81 mg by mouth daily at 12 noon.)     bisacodyl (DULCOLAX) 5 MG EC tablet Take 1 tablet (5 mg total) by mouth daily as needed for moderate constipation. 30 tablet 0   Bromfenac Sodium 0.09 % SOLN Place 1 drop into the left eye daily.     dexlansoprazole (DEXILANT) 60 MG capsule Take 1 capsule (60 mg total) by mouth daily. 90 capsule 1   docusate sodium (COLACE) 100 MG capsule Take 1 capsule (100 mg total) by mouth daily. 10 capsule 0   Evolocumab (REPATHA SURECLICK) 140 MG/ML SOAJ Inject 140 mg into the skin every 14 (fourteen)  days. 6 mL 3   FARXIGA 5 MG TABS tablet TAKE 1 TABLET(5 MG) BY MOUTH DAILY BREAKFAST 90 tablet 3   fluticasone (FLONASE) 50 MCG/ACT nasal spray Place 1 spray into both nostrils daily as needed for allergies or rhinitis. 18.2 mL 6   Glycerin-Polysorbate 80 (REFRESH DRY EYE THERAPY OP) Place 1 drop into both eyes 3 (three) times daily as needed (for dryness).     hydrocortisone (ANUSOL-HC) 25 MG suppository Place 1 suppository (25 mg total) rectally 2 (two) times daily. 12 suppository 0   insulin degludec (TRESIBA FLEXTOUCH) 100 UNIT/ML FlexTouch Pen Inject 30 Units into the skin 2 (two) times daily. 45 mL 5   Insulin Pen Needle 31G X 8 MM MISC 1 Device by Does not apply route in the morning, at noon, in the evening, and at bedtime. 400 each 2   ketoconazole (NIZORAL) 2 % cream Apply 1 Application topically daily. 60 g 2   losartan-hydrochlorothiazide (HYZAAR) 50-12.5 MG tablet Take 1 tablet by mouth in the morning and at bedtime. 180 tablet 3  moxifloxacin (VIGAMOX) 0.5 % ophthalmic solution Place one drop into the left eye 3 (three) times a day.     nebivolol (BYSTOLIC) 5 MG tablet NEW PRESCRIPTION REQUEST: TAKE ONE TABLET BY MOUTH DAILY 90 tablet 3   nitroGLYCERIN (NITROSTAT) 0.4 MG SL tablet DISSOLVE 1 TABLET UNDER THE TONGUE EVERY 5 MINUTES AS NEEDED FOR CHEST PAIN. DO NOT EXCEED A TOTAL OF 3 DOSES IN 15 MINUTES. 25 tablet 2   OZEMPIC, 2 MG/DOSE, 8 MG/3ML SOPN Inject into the skin.     polyethylene glycol (MIRALAX / GLYCOLAX) 17 g packet Take 17 g by mouth 2 (two) times daily. 14 each 0   prednisoLONE acetate (PRED FORTE) 1 % ophthalmic suspension INSTILL 1 DROP INTO LEFT EYE FOUR TIMES DAILY. START AFTER ARRIVING HOME FROM SURGERY.     rosuvastatin (CRESTOR) 40 MG tablet NEW PRESCRIPTION REQUEST: TAKE ONE TABLET BY MOUTH DAILY 90 tablet 3   tadalafil (CIALIS) 20 MG tablet Take 20 mg by mouth daily as needed.     vitamin B-12 (CYANOCOBALAMIN) 500 MCG tablet Take 2 tablets (1,000 mcg total) by  mouth daily. 90 tablet 3   furosemide (LASIX) 20 MG tablet Take 1 tablet (20 mg total) by mouth daily. 30 tablet 3   gabapentin (NEURONTIN) 300 MG capsule Take 1 capsule (300 mg total) by mouth 3 (three) times daily. 90 capsule 3   No facility-administered medications prior to visit.   Allergies  Allergen Reactions   Influenza Vaccines Anaphylaxis    Reaction June 2017   Influenza Virus Vaccine H5n1 Anaphylaxis   Fenofibrate Other (See Comments)    Hot flashes   Lisinopril Cough   Metformin Other (See Comments)    Chest discomfort   Prednisone Other (See Comments)    Mood swings   Augmentin [Amoxicillin-Pot Clavulanate] Rash    07/2022 tolerated cefazolin   Latex Rash   Objective:   Today's Vitals   09/25/23 1509  BP: 130/82  Pulse: 65  Temp: 98.6 F (37 C)  TempSrc: Temporal  SpO2: 100%  Weight: 297 lb 9.6 oz (135 kg)  Height: 6' (1.829 m)   Body mass index is 40.36 kg/m.   General: Well developed, well nourished. No acute distress. Extremities: Trace edema tot he right lower leg and 1+ edema on the left. The stump of the left foot is well healed   and shows no sign of current infection. The right foot appears normal. Skin: Warm and dry. No rashes. Neuro: CN II-XII intact. Normal sensation and DTR bilaterally. Psych: Alert and oriented. Normal mood and affect.  Health Maintenance Due  Topic Date Due   DTaP/Tdap/Td (4 - Td or Tdap) 10/10/2022   OPHTHALMOLOGY EXAM  09/05/2023     Assessment & Plan:   Problem List Items Addressed This Visit       Cardiovascular and Mediastinum   Essential hypertension - Primary (Chronic)    Blood pressure is in acceptable control. Continue amlodipine 10 mg daily, nebivolol 5 mg daily and losartan-HCTZ 50-12.5 bid.       Relevant Medications   furosemide (LASIX) 20 MG tablet   Chronic venous insufficiency of lower extremity    Previously evaluated by vascular. Patient has not been wearing his compression stockings. I  recommended he consider using the 20 mmHG stockings as advised by podiatry. I will renew his furosemide.      Relevant Medications   furosemide (LASIX) 20 MG tablet     Endocrine   Diabetic neuropathy (HCC)  Patient asks for renewal of gabapentin, which has helped for intermittent use related to his lower leg pain.      Relevant Medications   gabapentin (NEURONTIN) 600 MG tablet     Other   S/P transmetatarsal amputation of foot, left (HCC)    Foot well healed and no sign of complications.       Return for Follow-up as scheduled.   Loyola Mast, MD

## 2023-09-27 ENCOUNTER — Encounter: Payer: Self-pay | Admitting: Family Medicine

## 2023-09-27 DIAGNOSIS — I2510 Atherosclerotic heart disease of native coronary artery without angina pectoris: Secondary | ICD-10-CM

## 2023-09-28 MED ORDER — NEBIVOLOL HCL 5 MG PO TABS: ORAL_TABLET | ORAL | 1 refills | Status: DC

## 2023-09-28 NOTE — Addendum Note (Signed)
Addended by: Waymond Cera on: 09/28/2023 10:35 AM   Modules accepted: Orders

## 2023-09-28 NOTE — Telephone Encounter (Signed)
Rx sent to the [facility] for patient due to the original one was sent to [facility] on 12/14/22, #90, 3 rf.  Patient notified VIA phone. Dm/cma

## 2023-09-29 DIAGNOSIS — H2511 Age-related nuclear cataract, right eye: Secondary | ICD-10-CM | POA: Diagnosis not present

## 2023-09-29 DIAGNOSIS — H25811 Combined forms of age-related cataract, right eye: Secondary | ICD-10-CM | POA: Diagnosis not present

## 2023-09-29 DIAGNOSIS — H25812 Combined forms of age-related cataract, left eye: Secondary | ICD-10-CM | POA: Diagnosis not present

## 2023-10-06 NOTE — Telephone Encounter (Signed)
error 

## 2023-10-07 ENCOUNTER — Other Ambulatory Visit: Payer: Self-pay | Admitting: Family Medicine

## 2023-10-12 ENCOUNTER — Ambulatory Visit (INDEPENDENT_AMBULATORY_CARE_PROVIDER_SITE_OTHER): Payer: BC Managed Care – PPO | Admitting: Podiatry

## 2023-10-12 ENCOUNTER — Encounter: Payer: Self-pay | Admitting: Podiatry

## 2023-10-12 DIAGNOSIS — Z794 Long term (current) use of insulin: Secondary | ICD-10-CM | POA: Diagnosis not present

## 2023-10-12 DIAGNOSIS — B351 Tinea unguium: Secondary | ICD-10-CM | POA: Diagnosis not present

## 2023-10-12 DIAGNOSIS — E1140 Type 2 diabetes mellitus with diabetic neuropathy, unspecified: Secondary | ICD-10-CM | POA: Diagnosis not present

## 2023-10-13 NOTE — Progress Notes (Signed)
  Subjective:  Patient ID: Aaron Franco, male    DOB: [birth year],  MRN: [id number]  Chief Complaint  Patient presents with   Debridement    Trim toenails-diabetic - 8.2    52 y.o. male presents with the above complaint. History confirmed with patient.  Still dealing with the swelling has not changed much, the nails are still thickened elongated causing discomfort in shoe gear Objective:  Physical Exam: warm, good capillary refill, no trophic changes or ulcerative lesions, normal DP and PT pulses, still some persistent edema bilaterally, and abnormal sensory exam with diffuse neuropathy. Left Foot:  Well-healed transmetatarsal amputation Right Foot: dystrophic yellowed discolored nail plates with subungual debris   Assessment:   1. Onychomycosis   2. Type 2 diabetes mellitus with diabetic neuropathy, with long-term current use of insulin (HCC)       Plan:  Patient was evaluated and treated and all questions answered.  Continue elevating legs and use of compression garments for edema  Discussed the etiology and treatment options for the condition in detail with the patient.  Recommended debridement of the nails today. Sharp and mechanical debridement performed of all painful and mycotic nails today. Nails debrided in length and thickness using a nail nipper to level of comfort. Discussed treatment options including appropriate shoe gear. Follow up as needed for painful nails.      Return in about 3 months (around 01/10/2024) for at risk diabetic foot care.

## 2023-10-14 ENCOUNTER — Encounter: Payer: Self-pay | Admitting: Family

## 2023-10-14 ENCOUNTER — Ambulatory Visit: Payer: BC Managed Care – PPO | Admitting: Family

## 2023-10-14 DIAGNOSIS — I872 Venous insufficiency (chronic) (peripheral): Secondary | ICD-10-CM

## 2023-10-14 DIAGNOSIS — Z89432 Acquired absence of left foot: Secondary | ICD-10-CM | POA: Diagnosis not present

## 2023-10-14 NOTE — Progress Notes (Signed)
Office Visit Note   Patient: Aaron Franco           Date of Birth: 10-Aug-1971           MRN: [id number] Visit Date: 10/14/2023              Requested by: Loyola Mast, MD [address] [place],  [place] [address] PCP: Loyola Mast, MD  Chief Complaint  Patient presents with   Left Foot - Follow-up      HPI: The patient is a 52 year old gentleman who is seen in follow-up is status post transmetatarsal amputation about a year ago he would like to have have a bilateral foot evaluation.  Has not had any new ulcers also follows with podiatry.  Does question some darkened discoloration to the left lower extremity.  He reports wearing 30 to 40 mmHg compression garment on the right lower extremity and a 15-20 on the left  Assessment & Plan: Visit Diagnoses: No diagnosis found.  Plan: Reassurance provided.  The transmetatarsal amputation is well-healed there is no callus or impending ulceration.  Recommended he continue with his compression garments.  Follow-Up Instructions: No follow-ups on file.   Ortho Exam  Patient is alert, oriented, no adenopathy, well-dressed, normal affect, normal respiratory effort. On examination bilateral lower extremities there is no open area no erythema no edema of the left lower extremity has hemosiderin staining consistent with venous insufficiency.  The transmetatarsal amputation is well-healed  Imaging: No results found. No images are attached to the encounter.  Labs: Lab Results  Component Value Date   HGBA1C 8.2 (H) 05/26/2023   HGBA1C 9.0 (H) 02/04/2023   HGBA1C 10.0 (H) 11/11/2022   ESRSEDRATE 72 (H) 02/04/2023   ESRSEDRATE 52 (H) 11/11/2022   ESRSEDRATE 72 (H) 08/11/2022   CRP <1.0 02/04/2023   CRP <1.0 11/11/2022   CRP 4.7 08/11/2022   LABURIC 6.9 03/13/2020   LABURIC 5.0 06/06/2010   LABURIC 6.1 03/16/2009   REPTSTATUS 07/30/2022 FINAL 07/25/2022   GRAMSTAIN  07/25/2022    ABUNDANT WBC PRESENT, PREDOMINANTLY  PMN FEW GRAM POSITIVE COCCI IN PAIRS IN CLUSTERS INTRACELLULAR EXTRACELLULAR    CULT  07/25/2022    MODERATE STAPHYLOCOCCUS LUGDUNENSIS FEW ENTEROCOCCUS FAECALIS MODERATE DIPHTHEROIDS(CORYNEBACTERIUM SPECIES) Standardized susceptibility testing for this organism is not available. NO ANAEROBES ISOLATED Performed at [facility], [address] [place], [place] [address]    [facility] STAPHYLOCOCCUS LUGDUNENSIS 07/25/2022   LABORGA ENTEROCOCCUS FAECALIS 07/25/2022     Lab Results  Component Value Date   ALBUMIN 3.1 (L) 07/23/2022   ALBUMIN 4.4 11/28/2021   ALBUMIN 4.3 11/30/2020    No results found for: "MG" No results found for: "VD25OH"  No results found for: "PREALBUMIN"    Latest Ref Rng & Units 08/11/2023    4:37 PM 02/04/2023   11:05 AM 11/11/2022    3:59 PM  CBC EXTENDED  WBC 4.0 - 10.5 K/uL 7.8  7.6  7.1   RBC 4.22 - 5.81 Mil/uL 5.52  5.64  5.52   Hemoglobin 13.0 - 17.0 g/dL 65.7  84.6  96.2   HCT 39.0 - 52.0 % 43.2  42.8  42.6   Platelets 150.0 - 400.0 K/uL 309.0  333.0  291.0   NEUT# 1.4 - 7.7 K/uL  4.7  4.6   Lymph# 0.7 - 4.0 K/uL  1.9  1.7      There is no height or weight on file to calculate BMI.  Orders:  No orders of  the defined types were placed in this encounter.  No orders of the defined types were placed in this encounter.    Procedures: No procedures performed  Clinical Data: No additional findings.  ROS:  All other systems negative, except as noted in the HPI. Review of Systems  Objective: Vital Signs: There were no vitals taken for this visit.  Specialty Comments:  No specialty comments available.  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) 06/23/2023   Nuclear sclerotic cataract of both eyes 06/23/2023   Chronic kidney disease (CKD) stage G1/A2, glomerular filtration rate (GFR) equal to or greater than 90 mL/min/1.73 square meter and  albuminuria creatinine ratio between 30-299 mg/g 11/12/2022   Type 2 diabetes mellitus with both eyes affected by mild nonproliferative retinopathy without macular edema, with long-term current use of insulin (HCC) 09/09/2022   S/P transmetatarsal amputation of foot, left (HCC) 08/18/2022   Normocytic anemia 07/24/2022   Diabetic foot infection (HCC) 07/23/2022   Chronic venous insufficiency of lower extremity 01/23/2021   Allergic rhinitis due to pollen 06/29/2020   Diabetic neuropathy (HCC) 03/13/2020   Erectile dysfunction due to arterial insufficiency 03/13/2020   Coronary artery disease involving native heart without angina pectoris 03/29/2019   Onychomycosis 12/01/2017   Obesity (BMI 30-39.9) 02/18/2016   Chest pain with moderate risk of acute coronary syndrome 09/17/2015   Type 2 diabetes mellitus with diabetic neuropathy, unspecified (HCC) 04/23/2015   Thrush 04/23/2015   Type 2 diabetes 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, with long-term current use of insulin (HCC) 02/16/2014   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   Hammer toe, acquired 02/16/2014   CAD S/P percutaneous coronary angioplasty -  01/24/2014   Cardiac murmur 02/21/2011   Vitamin B12 deficiency 07/19/2010   Anemia due to vitamin B12 deficiency 10/31/2009   Trigger finger 10/16/2009   Dyslipidemia 03/01/2007   Gout 03/01/2007   Essential hypertension 03/01/2007   Gastroesophageal reflux disease 03/01/2007   Past Medical History:  Diagnosis Date   CAD (coronary artery disease)    a. 05/2013 NSTEMI/PCI: mCFX 100%-> 3.0 mm  x 20 mm Promus premier DES, EF 65%;  01/2014 Cath: LM nl, LAD 20-30d, LCX patent distal stent, RCA 30-9m, EF 55-65%.   Diabetes mellitus    Essential hypertension    GERD (gastroesophageal reflux disease)    Gout    History of echocardiogram    a. 03/2011 Echo: EF 65%, mild LVH, no rwma, mildly dil LA.   Hyperlipidemia    MI, old    Microalbuminuria due to type 2 diabetes mellitus  (HCC) 12/14/2017   Obesity     Family History  Problem Relation Age of Onset   Heart attack Father        deceased   Diabetes Father    Congestive Heart Failure Father    Stroke Mother    Pancreatic cancer Maternal Aunt    Colon cancer Neg Hx     Past Surgical History:  Procedure Laterality Date   AMPUTATION Left 07/25/2022   Procedure: AMPUTATION LEFT FOOT THROUGH MIDDLE OF FOOT;  Surgeon: Nadara Mustard, MD;  Location: [facility] OR;  Service: Orthopedics;  Laterality: Left;   BICEPS TENDON REPAIR  Nov 13 2010   right   HAND SURGERY  1990   left   LEFT HEART CATH N/A 05/12/2013   Procedure: LEFT HEART CATH;  Surgeon: Lesleigh Noe, MD;  [facility]  CATH LAB;  Service: Cardiovascular;  Laterality: N/A;   LEFT HEART CATHETERIZATION WITH CORONARY ANGIOGRAM N/A 01/24/2014   Procedure: LEFT HEART CATHETERIZATION WITH CORONARY ANGIOGRAM;  Surgeon: Peter M Swaziland, MD;  Location: [facility] CATH LAB;  Service: Cardiovascular;  Laterality: N/A;   PERCUTANEOUS CORONARY STENT INTERVENTION (PCI-S)  05/2013   mCx 3.67mm x 20 mm Promus P DES   PERCUTANEOUS CORONARY STENT INTERVENTION (PCI-S)  05/12/2013   Procedure: PERCUTANEOUS CORONARY STENT INTERVENTION (PCI-S);  Surgeon: Lesleigh Noe, MD;  Location: [facility] CATH LAB;  Service: Cardiovascular;;   Social History   Occupational History   Occupation: floor maintenance    Employer: [employer]    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

## 2023-10-20 ENCOUNTER — Ambulatory Visit (HOSPITAL_COMMUNITY): Payer: BC Managed Care – PPO | Attending: Cardiology

## 2023-10-20 DIAGNOSIS — I350 Nonrheumatic aortic (valve) stenosis: Secondary | ICD-10-CM | POA: Insufficient documentation

## 2023-10-20 LAB — ECHOCARDIOGRAM COMPLETE
AR max vel: 2.04 cm2
AV Area VTI: 2.13 cm2
AV Area mean vel: 1.87 cm2
AV Mean grad: 11.8 mm[Hg]
AV Peak grad: 18.4 mm[Hg]
Ao pk vel: 2.15 m/s
Calc EF: 60.4 %
S' Lateral: 2.99 cm
Single Plane A2C EF: 59.8 %
Single Plane A4C EF: 58.5 %

## 2023-10-21 ENCOUNTER — Other Ambulatory Visit (HOSPITAL_COMMUNITY): Payer: Self-pay

## 2023-10-21 DIAGNOSIS — I3139 Other pericardial effusion (noninflammatory): Secondary | ICD-10-CM

## 2023-10-30 LAB — HM DIABETES EYE EXAM

## 2023-11-13 ENCOUNTER — Encounter: Payer: Self-pay | Admitting: Family Medicine

## 2023-11-16 ENCOUNTER — Encounter: Payer: Self-pay | Admitting: Cardiology

## 2023-11-16 NOTE — Telephone Encounter (Signed)
 Copied from CRM [id number]. Topic: Clinical - Prescription Issue >> Nov 13, 2023  4:57 PM Tiffany H wrote: Reason for CRM: Johnston with [facility] called to advise that patient is out of this medication. There is a prior authorization required for this medication. Johnston advised that prior authorization can be done over the phone. Requested expedited request. Requested urgent request.   Phone: [phone] Instructions: option 2, then option 1

## 2023-11-16 NOTE — Telephone Encounter (Signed)
 Can you please and thank you start a PA? Thanks. Dm/cma

## 2023-11-16 NOTE — Telephone Encounter (Signed)
 Aaron Franco, CMA sent message to Prior Auth team to have PA initiated.

## 2023-11-17 ENCOUNTER — Telehealth: Payer: Self-pay

## 2023-11-17 NOTE — Telephone Encounter (Signed)
 Can you please and thank you start a PA for patients dexlansoprazole 60 mg for a 90 day supply.   Thanks.   Ian Bushman, cma

## 2023-11-18 ENCOUNTER — Telehealth: Payer: Self-pay

## 2023-11-18 ENCOUNTER — Other Ambulatory Visit (HOSPITAL_COMMUNITY): Payer: Self-pay

## 2023-11-18 NOTE — Telephone Encounter (Signed)
 Pharmacy Patient Advocate Encounter   Received notification from Pt Calls Messages that prior authorization for Dexlansoprazole  60mg  is required/requested.   Insurance verification completed.   The patient is insured through [employer] [facility] .   Per test claim: PA required; PA started via CoverMyMeds. KEY [id number] . Waiting for clinical questions to populate.

## 2023-11-19 ENCOUNTER — Other Ambulatory Visit (HOSPITAL_COMMUNITY): Payer: Self-pay

## 2023-11-20 ENCOUNTER — Other Ambulatory Visit (HOSPITAL_COMMUNITY): Payer: Self-pay

## 2023-11-23 ENCOUNTER — Other Ambulatory Visit (HOSPITAL_COMMUNITY): Payer: Self-pay

## 2023-11-25 ENCOUNTER — Other Ambulatory Visit (HOSPITAL_COMMUNITY): Payer: Self-pay

## 2023-11-25 ENCOUNTER — Encounter: Payer: Self-pay | Admitting: Family Medicine

## 2023-11-25 MED ORDER — DEXLANSOPRAZOLE 60 MG PO CPDR: 60.0000 mg | DELAYED_RELEASE_CAPSULE | Freq: Every day | ORAL | 1 refills | Status: DC

## 2023-11-25 NOTE — Telephone Encounter (Signed)
 Pharmacy Patient Advocate Encounter  Received notification from [employer] [facility] that Prior Authorization for Dexlansoprazole  60mg  caps has been APPROVED from 11/25/23 to 11/24/24  Insurance will fax over approval letter.

## 2023-12-01 ENCOUNTER — Ambulatory Visit
Admission: EM | Admit: 2023-12-01 | Discharge: 2023-12-01 | Disposition: A | Discharge status: Home / Self Care | Payer: Federal, State, Local not specified - PPO | Attending: Family Medicine | Admitting: Family Medicine

## 2023-12-01 DIAGNOSIS — K5900 Constipation, unspecified: Secondary | ICD-10-CM | POA: Diagnosis not present

## 2023-12-01 LAB — POC HEMOCCULT BLD/STL (OFFICE/1-CARD/DIAGNOSTIC): Fecal Occult Blood, POC: NEGATIVE

## 2023-12-01 NOTE — ED Provider Notes (Signed)
[name]    CSN: [id number] Arrival date & time: 12/01/23  1906      History   Chief Complaint Chief Complaint  Patient presents with   Constipation    HPI Aaron Franco is a 53 y.o. male.   The history is provided by the patient.  Constipation Associated symptoms: no abdominal pain, no diarrhea, no nausea and no vomiting   Constipation this is an ongoing problem, this episode started 2 days ago.  Has taken MiraLAX 1 capful daily, took mag citrate about 5 hours ago.  He admits a lot of rectal pressure and pain with attempted bowel movements.  Saw blood several days ago none today.  Denies abdominal pain, fever, vomiting.  Has had Cologuard test done never had colonoscopy.  Past Medical History:  Diagnosis Date   CAD (coronary artery disease)    a. 05/2013 NSTEMI/PCI: mCFX 100%-> 3.0 mm  x 20 mm Promus premier DES, EF 65%;  01/2014 Cath: LM nl, LAD 20-30d, LCX patent distal stent, RCA 30-53m, EF 55-65%.   Diabetes mellitus    Essential hypertension    GERD (gastroesophageal reflux disease)    Gout    History of echocardiogram    a. 03/2011 Echo: EF 65%, mild LVH, no rwma, mildly dil LA.   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) 06/23/2023   Nuclear sclerotic cataract of both eyes 06/23/2023   Chronic kidney disease (CKD) stage G1/A2, glomerular filtration rate (GFR) equal to or greater than 90 mL/min/1.73 square meter and albuminuria creatinine ratio between 30-299 mg/g 11/12/2022   Type 2 diabetes mellitus with both eyes affected by mild nonproliferative retinopathy without macular edema, with long-term current use of insulin (HCC) 09/09/2022   S/P transmetatarsal amputation of foot, left (HCC) 08/18/2022   Normocytic anemia 07/24/2022   Diabetic foot infection (HCC)  07/23/2022   Chronic venous insufficiency of lower extremity 01/23/2021   Allergic rhinitis due to pollen 06/29/2020   Diabetic neuropathy (HCC) 03/13/2020   Erectile dysfunction due to arterial insufficiency 03/13/2020   Coronary artery disease involving native heart without angina pectoris 03/29/2019   Onychomycosis 12/01/2017   Obesity (BMI 30-39.9) 02/18/2016   Chest pain with moderate risk of acute coronary syndrome 09/17/2015   Type 2 diabetes mellitus with diabetic neuropathy, unspecified (HCC) 04/23/2015   Thrush 04/23/2015   Type 2 diabetes 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, with long-term current use of insulin (HCC) 02/16/2014   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   Hammer toe, acquired 02/16/2014   CAD S/P percutaneous coronary angioplasty -  01/24/2014   Cardiac murmur 02/21/2011   Vitamin B12 deficiency 07/19/2010   Anemia due to vitamin B12 deficiency 10/31/2009   Trigger finger 10/16/2009   Dyslipidemia 03/01/2007   Gout 03/01/2007   Essential hypertension 03/01/2007   Gastroesophageal reflux disease 03/01/2007    Past Surgical History:  Procedure Laterality Date   AMPUTATION Left 07/25/2022   Procedure: AMPUTATION LEFT FOOT THROUGH MIDDLE OF FOOT;  Surgeon: Nadara Mustard, MD;  Location: [facility] OR;  Service: Orthopedics;  Laterality: Left;   BICEPS TENDON REPAIR  Nov 13 2010   right   HAND SURGERY  1990   left   LEFT HEART CATH N/A 05/12/2013   Procedure: LEFT HEART CATH;  Surgeon: Sherilyn Cooter  Jamey Reas, MD;  Location: [facility] CATH LAB;  Service: Cardiovascular;  Laterality: N/A;   LEFT HEART CATHETERIZATION WITH CORONARY ANGIOGRAM N/A 01/24/2014   Procedure: LEFT HEART CATHETERIZATION WITH CORONARY ANGIOGRAM;  Surgeon: Peter M Swaziland, MD;  Location: [facility] CATH LAB;  Service: Cardiovascular;  Laterality: N/A;   PERCUTANEOUS CORONARY STENT INTERVENTION (PCI-S)  05/2013   mCx 3.37mm x 20 mm Promus P DES   PERCUTANEOUS CORONARY STENT INTERVENTION (PCI-S)  05/12/2013    Procedure: PERCUTANEOUS CORONARY STENT INTERVENTION (PCI-S);  Surgeon: Lesleigh Noe, MD;  Location: [facility] CATH LAB;  Service: Cardiovascular;;       Home Medications    Prior to Admission medications   Medication Sig Start Date End Date Taking? Authorizing Provider  acetaminophen (TYLENOL) 325 MG tablet Take 2 tablets (650 mg total) by mouth every 4 (four) hours as needed for headache or mild pain. 05/06/17   Abelino Derrick, PA-C  acetaminophen-codeine (TYLENOL #3) 300-30 MG tablet Take 1 tablet by mouth every 4 (four) hours as needed for moderate pain. 06/23/23   Loyola Mast, MD  allopurinol (ZYLOPRIM) 100 MG tablet TAKE 1 TABLET(100 MG) BY MOUTH DAILY 05/11/23   Loyola Mast, MD  alum & mag hydroxide-simeth (MAALOX/MYLANTA) 200-200-20 MG/5ML suspension Take 15-30 mLs by mouth every 2 (two) hours as needed for indigestion. 07/28/22   Azucena Fallen, MD  amLODipine (NORVASC) 10 MG tablet TAKE 1 TABLET(10 MG) BY MOUTH DAILY 03/22/23   Loyola Mast, MD  aspirin 81 MG chewable tablet Chew 1 tablet (81 mg total) by mouth daily. Patient taking differently: Chew 81 mg by mouth daily at 12 noon. 05/14/13   Tereso Newcomer T, PA-C  bisacodyl (DULCOLAX) 5 MG EC tablet Take 1 tablet (5 mg total) by mouth daily as needed for moderate constipation. 07/28/22   Azucena Fallen, MD  Bromfenac Sodium 0.09 % SOLN Place 1 drop into the left eye daily.    [provider]  dexlansoprazole (DEXILANT) 60 MG capsule Take 1 capsule (60 mg total) by mouth daily. 11/25/23   Loyola Mast, MD  docusate sodium (COLACE) 100 MG capsule Take 1 capsule (100 mg total) by mouth daily. 07/29/22   Azucena Fallen, MD  Evolocumab (REPATHA SURECLICK) 140 MG/ML SOAJ Inject 140 mg into the skin every 14 (fourteen) days. 11/21/22   Rollene Rotunda, MD  FARXIGA 5 MG TABS tablet TAKE 1 TABLET(5 MG) BY MOUTH DAILY BREAKFAST 01/13/23   Loyola Mast, MD  fluticasone [facility]) 50 MCG/ACT nasal spray Place 1 spray  into both nostrils daily as needed for allergies or rhinitis. 06/29/20 10/13/24  Mliss Sax, MD  furosemide (LASIX) 20 MG tablet Take 1 tablet (20 mg total) by mouth daily. 09/25/23   Loyola Mast, MD  gabapentin (NEURONTIN) 600 MG tablet Take 1 tablet (600 mg total) by mouth daily as needed. 09/25/23   Loyola Mast, MD  Glycerin-Polysorbate 80 (REFRESH DRY EYE THERAPY OP) Place 1 drop into both eyes 3 (three) times daily as needed (for dryness).    [provider]  hydrocortisone (ANUSOL-HC) 25 MG suppository Place 1 suppository (25 mg total) rectally 2 (two) times daily. 10/15/22   Loyola Mast, MD  insulin degludec (TRESIBA FLEXTOUCH) 100 UNIT/ML FlexTouch Pen Inject 30 Units into the skin 2 (two) times daily. 09/29/22   Loyola Mast, MD  Insulin Pen Needle 31G X 8 MM MISC 1 Device by Does not apply route in the morning,  at noon, in the evening, and at bedtime. 01/04/21   Shamleffer, Konrad Dolores, MD  ketoconazole (NIZORAL) 2 % cream Apply 1 Application topically daily. 12/17/22   McDonald, Rachelle Hora, DPM  losartan-hydrochlorothiazide (HYZAAR) 50-12.5 MG tablet Take 1 tablet by mouth in the morning and at bedtime. 08/11/23   Loyola Mast, MD  moxifloxacin (VIGAMOX) 0.5 % ophthalmic solution Place one drop into the left eye 3 (three) times a day. 08/26/23   [provider]  nebivolol (BYSTOLIC) 5 MG tablet NEW PRESCRIPTION REQUEST: TAKE ONE TABLET BY MOUTH DAILY 09/28/23   Loyola Mast, MD  nitroGLYCERIN (NITROSTAT) 0.4 MG SL tablet DISSOLVE 1 TABLET UNDER THE TONGUE EVERY 5 MINUTES AS NEEDED FOR CHEST PAIN. DO NOT EXCEED A TOTAL OF 3 DOSES IN 15 MINUTES. 05/11/23   Loyola Mast, MD  OZEMPIC, 2 MG/DOSE, 8 MG/3ML SOPN Inject into the skin.    [provider]  polyethylene glycol (MIRALAX / GLYCOLAX) 17 g packet Take 17 g by mouth 2 (two) times daily. 07/28/22   Azucena Fallen, MD  prednisoLONE acetate (PRED FORTE) 1 % ophthalmic suspension  INSTILL 1 DROP INTO LEFT EYE FOUR TIMES DAILY. START AFTER ARRIVING HOME FROM SURGERY.    [provider]  rosuvastatin (CRESTOR) 40 MG tablet TAKE 1 TABLET(40 MG) BY MOUTH AT BEDTIME 10/07/23   Worthy Rancher B, FNP  tadalafil (CIALIS) 20 MG tablet Take 20 mg by mouth daily as needed. 10/28/22   [provider]  vitamin B-12 (CYANOCOBALAMIN) 500 MCG tablet Take 2 tablets (1,000 mcg total) by mouth daily. 08/11/23   Loyola Mast, MD    Family History Family History  Problem Relation Age of Onset   Stroke Mother    Heart attack Father        deceased   Diabetes Father    Congestive Heart Failure Father    Pancreatic cancer Maternal Aunt    Colon cancer Neg Hx     Social History Social History   Tobacco Use   Smoking status: Former    Current packs/day: 0.00    Types: Cigarettes    Quit date: 11/10/1998    Years since quitting: 25.0   Smokeless tobacco: Never   Tobacco comments:    pt only smoked 1-2 cigs a day, not everyday on occs x 2 years  Vaping Use   Vaping status: Never Used  Substance Use Topics   Alcohol use: Yes    Comment: rare   Drug use: No     Allergies   Influenza vaccines, Influenza virus vaccine h5n1, Fenofibrate, Lisinopril, Metformin, Prednisone, Augmentin [amoxicillin-pot clavulanate], and Latex   Review of Systems Review of Systems  Gastrointestinal:  Positive for abdominal distention, blood in stool, constipation and rectal pain. Negative for abdominal pain, anal bleeding, diarrhea, nausea and vomiting.     Physical Exam Triage Vital Signs ED Triage Vitals  Encounter Vitals Group     BP 12/01/23 1951 123/80     Systolic BP Percentile --      Diastolic BP Percentile --      Pulse Rate 12/01/23 1951 97     Resp 12/01/23 1951 20     Temp 12/01/23 1951 98.9 F (37.2 C)     Temp Source 12/01/23 1951 Oral     SpO2 12/01/23 1951 97 %     Weight 12/01/23 1949 290 lb (131.5 kg)     Height 12/01/23 1949 6' (1.829 m)     Head  Circumference --      Peak Flow --      Pain Score 12/01/23 1948 8     Pain Loc --      Pain Education --      Exclude from Growth Chart --    No data found.  Updated Vital Signs BP 123/80 (BP Location: Right Arm)   Pulse 97   Temp 98.9 F (37.2 C) (Oral)   Resp 20   Ht 6' (1.829 m)   Wt 290 lb (131.5 kg)   SpO2 97%   BMI 39.33 kg/m   Visual Acuity Right Eye Distance:   Left Eye Distance:   Bilateral Distance:    Right Eye Near:   Left Eye Near:    Bilateral Near:     Physical Exam Exam conducted with a chaperone present.  Constitutional:      Appearance: He is not ill-appearing.  Cardiovascular:     Rate and Rhythm: Normal rate.  Pulmonary:     Effort: Pulmonary effort is normal.  Abdominal:     General: Abdomen is protuberant. Bowel sounds are normal.     Comments: Abdomen is firm but nontender  Genitourinary:    Rectum: Guaiac result negative. Mass present.     Comments: Soft stool in rectal vault, there is hard stool higher in rectum Neurological:     Mental Status: He is alert.      UC Treatments / Results  Labs (all labs ordered are listed, but only abnormal results are displayed) Labs Reviewed  POC HEMOCCULT BLD/STL (OFFICE/1-CARD/DIAGNOSTIC)    EKG   Radiology No results found.  Procedures Procedures (including critical care time)  Medications Ordered in UC Medications - No data to display  Initial Impression / Assessment and Plan / UC Course  I have reviewed the triage vital signs and the nursing notes.  Pertinent labs & imaging results that were available during my care of the patient were reviewed by me and considered in my medical decision making (see chart for details).     53 year old with chronic constipation worsening symptoms for 2 days unresponsive to MiraLAX and citrate.  Firm stool noted higher in rectum soft stool distally.  Hemoccult negative recommend patient try MiraLAX cleanout, enema and rectal suppositories if no  result recommend ED evaluation for possible disimpaction Final Clinical Impressions(s) / UC Diagnoses   Final diagnoses:  Constipation, unspecified constipation type     Discharge Instructions      1 238 g bottle of MiraLAX mixed with 64 ounces of clear liquids Drink 8 ounce cup of mixture every 15 minutes When you finish the MiraLAX drink drink 4 to 6 cups of clear liquid  Can try glycerin suppositories or enema If no results go to emergency department   ED Prescriptions   None    PDMP not reviewed this encounter.   Meliton Rattan, [place] 12/01/23 2014

## 2023-12-01 NOTE — ED Triage Notes (Signed)
Pt presents with complaints of constipation x 2 days. Last BM was Sunday 1/19. Pt states he has tried OTC laxatives such as Miralax and Magnesium Citrate. Pt is also increasing fluid intake. Pt does feel the urge to go but cannot, feels pressure "but cannot come all the way through." Pt currently rates his overall pain a 10/10. Pt is currently standing, feels more comfortable than sitting.

## 2023-12-01 NOTE — Discharge Instructions (Signed)
1 238 g bottle of MiraLAX mixed with 64 ounces of clear liquids Drink 8 ounce cup of mixture every 15 minutes When you finish the MiraLAX drink drink 4 to 6 cups of clear liquid  Can try glycerin suppositories or enema If no results go to emergency department

## 2023-12-02 ENCOUNTER — Ambulatory Visit: Payer: Self-pay

## 2023-12-16 DIAGNOSIS — Z794 Long term (current) use of insulin: Secondary | ICD-10-CM | POA: Diagnosis not present

## 2023-12-16 DIAGNOSIS — E1165 Type 2 diabetes mellitus with hyperglycemia: Secondary | ICD-10-CM | POA: Diagnosis not present

## 2023-12-16 DIAGNOSIS — Z1330 Encounter for screening examination for mental health and behavioral disorders, unspecified: Secondary | ICD-10-CM | POA: Diagnosis not present

## 2023-12-26 LAB — HEMOGLOBIN A1C: Hemoglobin A1C: 8

## 2024-01-05 ENCOUNTER — Encounter: Payer: Self-pay | Admitting: Family Medicine

## 2024-01-07 ENCOUNTER — Encounter: Payer: Self-pay | Admitting: Cardiology

## 2024-01-08 ENCOUNTER — Other Ambulatory Visit (HOSPITAL_COMMUNITY): Payer: Self-pay

## 2024-01-08 ENCOUNTER — Telehealth: Payer: Self-pay | Admitting: Pharmacy Technician

## 2024-01-08 NOTE — Telephone Encounter (Signed)
  Pharmacy Patient Advocate Encounter-  2 insurances   Received notification from Patient Advice Request messages that prior authorization for Repatha is required/requested.   Insurance verification completed.   The patient is insured through [employer] .   Per test claim: PA required; PA submitted to above mentioned insurance via CoverMyMeds Key/confirmation #/EOC [id number] Status is pending    2nd insurance: Pharmacy Patient Advocate Encounter   Received notification from Patient Advice Request messages that prior authorization for Repatha is required/requested.   Insurance verification completed.   The patient is insured through [facility] ADVANTAGE/RX ADVANCE .   Per test claim: PA required; PA submitted to above mentioned insurance via CoverMyMeds Key/confirmation #/EOC [id number] Status is pending

## 2024-01-08 NOTE — Telephone Encounter (Addendum)
  Pharmacy Patient Advocate Encounter- first insurance  Received notification from [facility] that Prior Authorization for Repatha  has been APPROVED from 01/08/24 to 01/07/25. Ran test claim, Copay is $24.99- one month. This test claim was processed through [facility]- copay amounts may vary at other pharmacies due to pharmacy/plan contracts, or as the patient moves through the different stages of their insurance plan.   PA #/Case ID/Reference #: [id number]       Pharmacy Patient Advocate Encounter- Second insurance  Received notification from [facility] ADVANTAGE/RX ADVANCE that Prior Authorization for Repatha has been APPROVED from 01/08/24 to 01/06/25. Ran test claim, Copay is $47.00- one month. This test claim was processed through [facility]- copay amounts may vary at other pharmacies due to pharmacy/plan contracts, or as the patient moves through the different stages of their insurance plan.   PA #/Case ID/Reference #: [id number]

## 2024-01-11 ENCOUNTER — Ambulatory Visit (INDEPENDENT_AMBULATORY_CARE_PROVIDER_SITE_OTHER): Payer: Federal, State, Local not specified - PPO | Admitting: Family Medicine

## 2024-01-11 ENCOUNTER — Encounter (HOSPITAL_BASED_OUTPATIENT_CLINIC_OR_DEPARTMENT_OTHER): Payer: Self-pay

## 2024-01-11 ENCOUNTER — Telehealth: Payer: Self-pay | Admitting: Cardiology

## 2024-01-11 ENCOUNTER — Encounter: Payer: Self-pay | Admitting: Family Medicine

## 2024-01-11 ENCOUNTER — Other Ambulatory Visit (HOSPITAL_COMMUNITY): Payer: Self-pay

## 2024-01-11 VITALS — BP 130/78 | HR 86 | Temp 98.1°F | Ht 72.0 in | Wt 292.2 lb

## 2024-01-11 DIAGNOSIS — I2510 Atherosclerotic heart disease of native coronary artery without angina pectoris: Secondary | ICD-10-CM | POA: Diagnosis not present

## 2024-01-11 DIAGNOSIS — Z7985 Long-term (current) use of injectable non-insulin antidiabetic drugs: Secondary | ICD-10-CM

## 2024-01-11 DIAGNOSIS — Z23 Encounter for immunization: Secondary | ICD-10-CM

## 2024-01-11 DIAGNOSIS — E1140 Type 2 diabetes mellitus with diabetic neuropathy, unspecified: Secondary | ICD-10-CM | POA: Diagnosis not present

## 2024-01-11 DIAGNOSIS — Z7984 Long term (current) use of oral hypoglycemic drugs: Secondary | ICD-10-CM

## 2024-01-11 DIAGNOSIS — E1142 Type 2 diabetes mellitus with diabetic polyneuropathy: Secondary | ICD-10-CM

## 2024-01-11 DIAGNOSIS — I10 Essential (primary) hypertension: Secondary | ICD-10-CM

## 2024-01-11 DIAGNOSIS — Z0000 Encounter for general adult medical examination without abnormal findings: Secondary | ICD-10-CM | POA: Diagnosis not present

## 2024-01-11 DIAGNOSIS — Z125 Encounter for screening for malignant neoplasm of prostate: Secondary | ICD-10-CM

## 2024-01-11 DIAGNOSIS — Z794 Long term (current) use of insulin: Secondary | ICD-10-CM

## 2024-01-11 DIAGNOSIS — I080 Rheumatic disorders of both mitral and aortic valves: Secondary | ICD-10-CM | POA: Insufficient documentation

## 2024-01-11 DIAGNOSIS — N181 Chronic kidney disease, stage 1: Secondary | ICD-10-CM

## 2024-01-11 DIAGNOSIS — E7849 Other hyperlipidemia: Secondary | ICD-10-CM

## 2024-01-11 DIAGNOSIS — E785 Hyperlipidemia, unspecified: Secondary | ICD-10-CM

## 2024-01-11 NOTE — Addendum Note (Signed)
 Addended by: Loyola Mast on: 01/11/2024 05:31 PM   Modules accepted: Level of Service

## 2024-01-11 NOTE — Assessment & Plan Note (Signed)
Stable. Continue Repatha and rosuvastatin 40 mg daily.

## 2024-01-11 NOTE — Assessment & Plan Note (Signed)
 Reviewed recent echocardiogram. Continue to follow with cardiology.

## 2024-01-11 NOTE — Assessment & Plan Note (Signed)
 Blood pressure is in acceptable control. Continue amlodipine 10 mg daily, nebivolol 5 mg daily and losartan-HCTZ 50-12.5 bid.

## 2024-01-11 NOTE — Assessment & Plan Note (Signed)
Stable. No angina. Continue nebivolol 5 mg daily, aspirin 81 mg daily and dapagliflozin 5 mg daily.

## 2024-01-11 NOTE — Assessment & Plan Note (Signed)
 Hand weakness likely due to diabetic neuropathy.

## 2024-01-11 NOTE — Progress Notes (Signed)
 [facility] [address] [place] [address] Dept: [phone] Dept Fax: [phone]  Annual Physical Visit  Subjective:    Patient ID: Aaron Franco, male    DOB: Sep 24, 1971, 53 y.o..   MRN: [id number]  Chief Complaint  Patient presents with   Annual Exam    CPE/labs.   No concerns.     History of Present Illness:  Patient is in today for an annual physical/preventative visit.  Aaron Franco had a left transmetatarsal amputation secondary to a diabetic foot infection in mid-Sept. 2023. He has some chronic edema of both lower legs, L>R due to chronic venous insufficiency. He had seen cardiology and had an echocardiogram that showed some mitral and aortic valve issues, but no heart failure.   Aaron Franco has a history of hypertension. He is on amlodipine 10 mg daily, nebivolol 5 mg daily, and losartan-HCTZ (Hyzaar) 50-12.5 mg twice daily.     Aaron Franco has had issues with uncontrolled diabetes, complicated by peripheral neuropathy. He is followed by Sonny Dandy, NP (endocrinology). His last A1c was 8.0% (12/16/2023), which is stable. His neuropathy is managed on gabapentin 600 mg daily as needed.  Review of Systems  Constitutional:  Positive for weight loss. Negative for chills, diaphoresis, fever and malaise/fatigue.       Currently on Ozempic.  HENT:  Positive for tinnitus. Negative for congestion, ear pain, hearing loss, sinus pain and sore throat.        Intermittent ringing in his ears.  Eyes:  Negative for blurred vision, pain, discharge and redness.  Respiratory:  Positive for shortness of breath. Negative for cough and wheezing.        Notes occasional dyspnea with exertion. Working with cardiology regarding assessment.  Cardiovascular:  Negative for chest pain and palpitations.  Gastrointestinal:  Negative for abdominal pain, constipation, diarrhea, heartburn, nausea and vomiting.  Musculoskeletal:  Positive  for joint pain. Negative for back pain and myalgias.       Mild morning stiffness of hands.  Skin:  Negative for itching and rash.  Neurological:  Positive for focal weakness.       Notes some weakness in the left 2nd finger.  Psychiatric/Behavioral:  Negative for depression. The patient is not nervous/anxious.    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) 06/23/2023   Nuclear sclerotic cataract of both eyes 06/23/2023   Chronic kidney disease (CKD) stage G1/A2, glomerular filtration rate (GFR) equal to or greater than 90 mL/min/1.73 square meter and albuminuria creatinine ratio between 30-299 mg/g 11/12/2022   Type 2 diabetes mellitus with both eyes affected by mild nonproliferative retinopathy without macular edema, with long-term current use of insulin (HCC) 09/09/2022   S/P transmetatarsal amputation of foot, left (HCC) 08/18/2022   Normocytic anemia 07/24/2022   Diabetic foot infection (HCC) 07/23/2022   Chronic venous insufficiency of lower extremity 01/23/2021   Allergic rhinitis due to pollen 06/29/2020   Diabetic neuropathy (HCC) 03/13/2020   Erectile dysfunction due to arterial insufficiency 03/13/2020   Coronary artery disease involving native heart without angina pectoris 03/29/2019   Onychomycosis 12/01/2017   Obesity (BMI 30-39.9) 02/18/2016   Chest pain with moderate risk of acute coronary syndrome 09/17/2015   Type 2 diabetes mellitus with diabetic neuropathy, unspecified (HCC) 04/23/2015   Thrush 04/23/2015   Type 2 diabetes mellitus with stage 1 chronic  kidney disease, with long-term current use of insulin (HCC) 02/16/2014   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   Hammer toe, acquired 02/16/2014   CAD S/P percutaneous coronary angioplasty -  01/24/2014   Cardiac murmur 02/21/2011   Vitamin B12 deficiency 07/19/2010    Anemia due to vitamin B12 deficiency 10/31/2009   Trigger finger 10/16/2009   Dyslipidemia 03/01/2007   Gout 03/01/2007   Essential hypertension 03/01/2007   Gastroesophageal reflux disease 03/01/2007   Past Surgical History:  Procedure Laterality Date   AMPUTATION Left 07/25/2022   Procedure: AMPUTATION LEFT FOOT THROUGH MIDDLE OF FOOT;  Surgeon: Nadara Mustard, MD;  Location: [facility] OR;  Service: Orthopedics;  Laterality: Left;   BICEPS TENDON REPAIR  Nov 13 2010   right   HAND SURGERY  1990   left   LEFT HEART CATH N/A 05/12/2013   Procedure: LEFT HEART CATH;  Surgeon: Lesleigh Noe, MD;  Location: [facility] CATH LAB;  Service: Cardiovascular;  Laterality: N/A;   LEFT HEART CATHETERIZATION WITH CORONARY ANGIOGRAM N/A 01/24/2014   Procedure: LEFT HEART CATHETERIZATION WITH CORONARY ANGIOGRAM;  Surgeon: Peter M Swaziland, MD;  Location: [facility] CATH LAB;  Service: Cardiovascular;  Laterality: N/A;   PERCUTANEOUS CORONARY STENT INTERVENTION (PCI-S)  05/2013   mCx 3.25mm x 20 mm Promus P DES   PERCUTANEOUS CORONARY STENT INTERVENTION (PCI-S)  05/12/2013   Procedure: PERCUTANEOUS CORONARY STENT INTERVENTION (PCI-S);  Surgeon: Lesleigh Noe, MD;  Location: [facility] CATH LAB;  Service: Cardiovascular;;   Family History  Problem Relation Age of Onset   Stroke Mother    Heart attack Father        deceased   Diabetes Father    Congestive Heart Failure Father    Pancreatic cancer Maternal Aunt    Colon cancer Neg Hx    Outpatient Medications Prior to Visit  Medication Sig Dispense Refill   acetaminophen (TYLENOL) 325 MG tablet Take 2 tablets (650 mg total) by mouth every 4 (four) hours as needed for headache or mild pain.     allopurinol (ZYLOPRIM) 100 MG tablet TAKE 1 TABLET(100 MG) BY MOUTH DAILY 90 tablet 0   alum & mag hydroxide-simeth (MAALOX/MYLANTA) 200-200-20 MG/5ML suspension Take 15-30 mLs by mouth every 2 (two) hours as needed for indigestion. 355 mL 0   amLODipine (NORVASC) 10 MG tablet TAKE 1  TABLET(10 MG) BY MOUTH DAILY 90 tablet 3   aspirin 81 MG chewable tablet Chew 1 tablet (81 mg total) by mouth daily. (Patient taking differently: Chew 81 mg by mouth daily at 12 noon.)     bisacodyl (DULCOLAX) 5 MG EC tablet Take 1 tablet (5 mg total) by mouth daily as needed for moderate constipation. 30 tablet 0   dexlansoprazole (DEXILANT) 60 MG capsule Take 1 capsule (60 mg total) by mouth daily. 90 capsule 1   docusate sodium (COLACE) 100 MG capsule Take 1 capsule (100 mg total) by mouth daily. 10 capsule 0   Evolocumab (REPATHA SURECLICK) 140 MG/ML SOAJ Inject 140 mg into the skin every 14 (fourteen) days. 6 mL 3   FARXIGA 5 MG TABS tablet TAKE 1 TABLET(5 MG) BY MOUTH DAILY BREAKFAST 90 tablet 3   fluticasone (FLONASE) 50 MCG/ACT nasal spray Place 1 spray into both nostrils daily as needed for allergies or rhinitis. 18.2 mL 6   furosemide (LASIX) 20 MG tablet Take 1 tablet (20 mg total) by mouth daily. 30 tablet 3   gabapentin (NEURONTIN) 600 MG tablet Take 1 tablet (600 mg total)  by mouth daily as needed. 90 tablet 3   Glycerin-Polysorbate 80 (REFRESH DRY EYE THERAPY OP) Place 1 drop into both eyes 3 (three) times daily as needed (for dryness).     insulin degludec (TRESIBA FLEXTOUCH) 100 UNIT/ML FlexTouch Pen Inject 30 Units into the skin 2 (two) times daily. (Patient taking differently: Inject 40 Units into the skin 2 (two) times daily.) 45 mL 5   Insulin Pen Needle 31G X 8 MM MISC 1 Device by Does not apply route in the morning, at noon, in the evening, and at bedtime. 400 each 2   ketoconazole (NIZORAL) 2 % cream Apply 1 Application topically daily. 60 g 2   losartan-hydrochlorothiazide (HYZAAR) 50-12.5 MG tablet Take 1 tablet by mouth in the morning and at bedtime. 180 tablet 3   nebivolol (BYSTOLIC) 5 MG tablet NEW PRESCRIPTION REQUEST: TAKE ONE TABLET BY MOUTH DAILY 90 tablet 1   nitroGLYCERIN (NITROSTAT) 0.4 MG SL tablet DISSOLVE 1 TABLET UNDER THE TONGUE EVERY 5 MINUTES AS NEEDED FOR  CHEST PAIN. DO NOT EXCEED A TOTAL OF 3 DOSES IN 15 MINUTES. 25 tablet 2   OZEMPIC, 2 MG/DOSE, 8 MG/3ML SOPN Inject into the skin.     polyethylene glycol (MIRALAX / GLYCOLAX) 17 g packet Take 17 g by mouth 2 (two) times daily. 14 each 0   rosuvastatin (CRESTOR) 40 MG tablet TAKE 1 TABLET(40 MG) BY MOUTH AT BEDTIME 90 tablet 3   tadalafil (CIALIS) 20 MG tablet Take 20 mg by mouth daily as needed.     vitamin B-12 (CYANOCOBALAMIN) 500 MCG tablet Take 2 tablets (1,000 mcg total) by mouth daily. 90 tablet 3   acetaminophen-codeine (TYLENOL #3) 300-30 MG tablet Take 1 tablet by mouth every 4 (four) hours as needed for moderate pain. 15 tablet 0   Bromfenac Sodium 0.09 % SOLN Place 1 drop into the left eye daily.     hydrocortisone (ANUSOL-HC) 25 MG suppository Place 1 suppository (25 mg total) rectally 2 (two) times daily. 12 suppository 0   moxifloxacin (VIGAMOX) 0.5 % ophthalmic solution Place one drop into the left eye 3 (three) times a day.     prednisoLONE acetate (PRED FORTE) 1 % ophthalmic suspension INSTILL 1 DROP INTO LEFT EYE FOUR TIMES DAILY. START AFTER ARRIVING HOME FROM SURGERY.     No facility-administered medications prior to visit.   Allergies  Allergen Reactions   Influenza Vaccines Anaphylaxis    Reaction June 2017   Influenza Virus Vaccine H5n1 Anaphylaxis   Fenofibrate Other (See Comments)    Hot flashes   Lisinopril Cough   Metformin Other (See Comments)    Chest discomfort   Prednisone Other (See Comments)    Mood swings   Augmentin [Amoxicillin-Pot Clavulanate] Rash    07/2022 tolerated cefazolin   Latex Rash   Objective:   Today's Vitals   01/11/24 1538  BP: 130/78  Pulse: 86  Temp: 98.1 F (36.7 C)  TempSrc: Temporal  SpO2: 99%  Weight: 292 lb 3.2 oz (132.5 kg)  Height: 6' (1.829 m)   Body mass index is 39.63 kg/m.   General: Well developed, well nourished. No acute distress. HEENT: Normocephalic, non-traumatic. PERRL, EOMI. Conjunctiva clear.  External ears normal. EAC and   TMs normal bilaterally. Nose clear without congestion or rhinorrhea. Mucous membranes moist.   Oropharynx clear. Good dentition. Neck: Supple. No lymphadenopathy. No thyromegaly. Lungs: Clear to auscultation bilaterally. No wheezing, rales or rhonchi. CV: RRR with a IV/VI decrescendo systolic murmur. Pulses 2+ bilaterally.  Abdomen: Soft, non-tender. Bowel sounds positive, normal pitch and frequency. No hepatosplenomegaly.   No rebound or guarding. Moderate diastasis. Extremities: Full ROM. No joint swelling or tenderness. No edema noted. Feet- Mild scaliness of soles. No sign of maceration between toes on right. S/p left transmetatarsal   amputation. Right great toe nail is thickened with onychogryphosis. Dorsalis pedis and posterior tibial   artery pulses are normal. 5.07 monofilament testing with some areas of decreased sensation. Skin: Warm and dry. No rashes. Neuro: Mild weakness of adductors of the left hand, esp. 2nd finger. There is mild wasting of the left 1st   dorsal interosseous muscle. Psych: Alert and oriented. Normal mood and affect.  Health Maintenance Due  Topic Date Due   Pneumococcal Vaccine 37-56 Years old (1 of 2 - PCV) Never done   Zoster Vaccines- Shingrix (1 of 2) Never done   Diabetic kidney evaluation - Urine ACR  11/12/2023   Diabetic kidney evaluation - eGFR measurement  02/04/2024   Lab Results Component Ref Range & Units 3 wk ago  Hemoglobin A1c 4.8 - 5.6 % 8.0 Abnormal    Assessment & Plan:   Problem List Items Addressed This Visit       Cardiovascular and Mediastinum   Essential hypertension (Chronic)   Blood pressure is in acceptable control. Continue amlodipine 10 mg daily, nebivolol 5 mg daily and losartan-HCTZ 50-12.5 bid.       Coronary artery disease involving native heart without angina pectoris   Stable. No angina. Continue nebivolol 5 mg daily, aspirin 81 mg daily and dapagliflozin 5 mg daily.       Mitral insufficiency and aortic stenosis   Reviewed recent echocardiogram. Continue to follow with cardiology.        Endocrine   Diabetic neuropathy (HCC)   Hand weakness likely due to diabetic neuropathy.      Type 2 diabetes mellitus with diabetic neuropathy, unspecified (HCC)   Continue to follow with Ms. Ricki Miller. Recent A1c stable. Continue dapagliflozin (Farxiga) 5 mg daily, insulin degludec 40 units bid, and semaglutide (Ozempic) 2 mg weekly.      Relevant Orders   Urinalysis, Routine w reflex microscopic     Genitourinary   Chronic kidney disease (CKD) stage G1/A2, glomerular filtration rate (GFR) equal to or greater than 90 mL/min/1.73 square meter and albuminuria creatinine ratio between 30-299 mg/g   He is currently on a SGLT2i and ARB. Continue focus on blood pressure and glucose control, adequate hydration, and avoidance of nephrotoxic medications.        Other   Dyslipidemia   Stable. Continue Repatha and rosuvastatin 40 mg daily.      Other Visit Diagnoses       Annual physical exam    -  Primary   Overall fair health. Discussed recommended screenings and immunizations.     Screening for prostate cancer       Relevant Orders   PSA     Need for Td vaccine       Relevant Orders   Td vaccine greater than or equal to 7yo preservative free IM (Completed)       Return in about 3 months (around 04/12/2024) for Reassessment.   Loyola Mast, MD

## 2024-01-11 NOTE — Assessment & Plan Note (Signed)
 Continue to follow with Ms. Aaron Franco. Recent A1c stable. Continue dapagliflozin (Farxiga) 5 mg daily, insulin degludec 40 units bid, and semaglutide (Ozempic) 2 mg weekly.

## 2024-01-11 NOTE — Telephone Encounter (Signed)
*  STAT* If patient is at the pharmacy, call can be transferred to refill team.   1. Which medications need to be refilled? (please list name of each medication and dose if known) Evolocumab (REPATHA SURECLICK) 140 MG/ML SOAJ    2. Would you like to learn more about the convenience, safety, & potential cost savings by using the [facility]?     3. Are you open to using the [facility] (Type [facility].    4. Which pharmacy/location (including street and city if local pharmacy) is medication to be sent to? [facility] - [place], [place] - [address]    5. Do they need a 30 day or 90 day supply?  90 day Patient is out of medication

## 2024-01-11 NOTE — Assessment & Plan Note (Signed)
He is currently on a SGLT2i and ARB. Continue focus on blood pressure and glucose control, adequate hydration, and avoidance of nephrotoxic medications.

## 2024-01-12 ENCOUNTER — Encounter: Payer: Self-pay | Admitting: Family Medicine

## 2024-01-12 LAB — URINALYSIS, ROUTINE W REFLEX MICROSCOPIC
Bilirubin Urine: NEGATIVE
Ketones, ur: NEGATIVE
Leukocytes,Ua: NEGATIVE
Nitrite: NEGATIVE
Specific Gravity, Urine: 1.015 (ref 1.000–1.030)
Total Protein, Urine: 30 — AB
Urine Glucose: >=1000 — AB
Urobilinogen, UA: 0.2 (ref 0.0–1.0)
WBC, UA: NONE SEEN (ref 0–?)
pH: 6 (ref 5.0–8.0)

## 2024-01-12 LAB — PSA: PSA: 0.77 ng/mL (ref 0.10–4.00)

## 2024-01-12 MED ORDER — REPATHA SURECLICK 140 MG/ML ~~LOC~~ SOAJ: 140.0000 mg | SUBCUTANEOUS | 1 refills | Status: AC

## 2024-01-14 ENCOUNTER — Ambulatory Visit (HOSPITAL_COMMUNITY): Payer: Federal, State, Local not specified - PPO | Attending: Cardiology

## 2024-01-14 DIAGNOSIS — I3139 Other pericardial effusion (noninflammatory): Secondary | ICD-10-CM | POA: Diagnosis not present

## 2024-01-14 LAB — ECHOCARDIOGRAM LIMITED
AR max vel: 1.49 cm2
AV Area VTI: 1.67 cm2
AV Area mean vel: 1.49 cm2
AV Mean grad: 19 mmHg
AV Peak grad: 29.3 mmHg
Ao pk vel: 2.71 m/s
Area-P 1/2: 4.58 cm2
S' Lateral: 2.6 cm

## 2024-01-20 ENCOUNTER — Ambulatory Visit (INDEPENDENT_AMBULATORY_CARE_PROVIDER_SITE_OTHER): Payer: BC Managed Care – PPO | Admitting: Podiatry

## 2024-01-20 DIAGNOSIS — Z91198 Patient's noncompliance with other medical treatment and regimen for other reason: Secondary | ICD-10-CM

## 2024-01-21 NOTE — Progress Notes (Signed)
 1. Failure to attend appointment with reason given    Patient had family emergency. Rescheduled appointment.

## 2024-02-01 ENCOUNTER — Ambulatory Visit (INDEPENDENT_AMBULATORY_CARE_PROVIDER_SITE_OTHER): Admitting: Podiatry

## 2024-02-01 DIAGNOSIS — D2271 Melanocytic nevi of right lower limb, including hip: Secondary | ICD-10-CM | POA: Diagnosis not present

## 2024-02-01 DIAGNOSIS — M79674 Pain in right toe(s): Secondary | ICD-10-CM

## 2024-02-01 DIAGNOSIS — B351 Tinea unguium: Secondary | ICD-10-CM | POA: Diagnosis not present

## 2024-02-01 NOTE — Progress Notes (Signed)
  Subjective:  Patient ID: Aaron Franco, male    DOB: 02/15/1971,  MRN: [id number]  Chief Complaint  Patient presents with   Nail Problem    Pt stated that he is here for his 3 month follow up, nail trim     53 y.o. male presents with the above complaint. History confirmed with patient.  Nails are thick and elongated again..  Notices mold on the feet now unsure how long they have been there.  A1c is about 8%.  Hoping to get it down to 7% or lower  Objective:  Physical Exam: warm, good capillary refill, no trophic changes or ulcerative lesions, normal DP and PT pulses, and abnormal sensory exam with diffuse neuropathy.  Multiple small benign-appearing junctional nevi plantar midfoot Left Foot:  Well-healed transmetatarsal amputation Right Foot: dystrophic yellowed discolored nail plates with subungual debris   Assessment:   1. Nevus of plantar aspect of right foot   2. Pain due to onychomycosis of toenail of right foot        Plan:  Patient was evaluated and treated and all questions answered.  Discussed etiology of junctional nevi and is appear to be benign.  Recommended monitoring and if changing increasing in size or coloration or ulceration or bleeding to notify me for biopsy.  Discussed the etiology and treatment options for the condition in detail with the patient.  Recommended debridement of the nails today. Sharp and mechanical debridement performed of all painful and mycotic nails today. Nails debrided in length and thickness using a nail nipper to level of comfort. Discussed treatment options including appropriate shoe gear. Follow up as needed for painful nails.      Return in about 3 months (around 05/03/2024) for at risk diabetic foot care.

## 2024-03-04 ENCOUNTER — Other Ambulatory Visit: Payer: Self-pay | Admitting: Family Medicine

## 2024-03-04 ENCOUNTER — Encounter: Payer: Self-pay | Admitting: Family Medicine

## 2024-03-04 DIAGNOSIS — I10 Essential (primary) hypertension: Secondary | ICD-10-CM

## 2024-03-04 MED ORDER — AMLODIPINE BESYLATE 10 MG PO TABS: 10.0000 mg | ORAL_TABLET | Freq: Every day | ORAL | 2 refills | Status: DC

## 2024-03-04 MED ORDER — LOSARTAN POTASSIUM-HCTZ 50-12.5 MG PO TABS: 1.0000 | ORAL_TABLET | Freq: Two times a day (BID) | ORAL | 2 refills | Status: DC

## 2024-04-05 ENCOUNTER — Other Ambulatory Visit: Payer: Self-pay

## 2024-04-05 DIAGNOSIS — E1140 Type 2 diabetes mellitus with diabetic neuropathy, unspecified: Secondary | ICD-10-CM

## 2024-04-05 MED ORDER — TRESIBA FLEXTOUCH 100 UNIT/ML ~~LOC~~ SOPN: 30.0000 [IU] | PEN_INJECTOR | Freq: Two times a day (BID) | SUBCUTANEOUS | 5 refills | Status: DC

## 2024-04-05 NOTE — Telephone Encounter (Signed)
 Refill request for  Tresiba   LR  09/29/22,  45 ml, #5 rf LOV  01/11/24 FOV  04/13/24   Please review and advise.  Thanks. Dm/cma

## 2024-04-13 ENCOUNTER — Ambulatory Visit (INDEPENDENT_AMBULATORY_CARE_PROVIDER_SITE_OTHER): Admitting: Family Medicine

## 2024-04-13 ENCOUNTER — Encounter: Payer: Self-pay | Admitting: Family Medicine

## 2024-04-13 VITALS — BP 134/82 | HR 80 | Temp 96.9°F | Ht 72.0 in | Wt 274.0 lb

## 2024-04-13 DIAGNOSIS — E785 Hyperlipidemia, unspecified: Secondary | ICD-10-CM

## 2024-04-13 DIAGNOSIS — Z794 Long term (current) use of insulin: Secondary | ICD-10-CM

## 2024-04-13 DIAGNOSIS — N181 Chronic kidney disease, stage 1: Secondary | ICD-10-CM | POA: Diagnosis not present

## 2024-04-13 DIAGNOSIS — Z7984 Long term (current) use of oral hypoglycemic drugs: Secondary | ICD-10-CM

## 2024-04-13 DIAGNOSIS — E669 Obesity, unspecified: Secondary | ICD-10-CM

## 2024-04-13 DIAGNOSIS — I2510 Atherosclerotic heart disease of native coronary artery without angina pectoris: Secondary | ICD-10-CM | POA: Diagnosis not present

## 2024-04-13 DIAGNOSIS — E1140 Type 2 diabetes mellitus with diabetic neuropathy, unspecified: Secondary | ICD-10-CM

## 2024-04-13 DIAGNOSIS — Z7985 Long-term (current) use of injectable non-insulin antidiabetic drugs: Secondary | ICD-10-CM

## 2024-04-13 DIAGNOSIS — I10 Essential (primary) hypertension: Secondary | ICD-10-CM | POA: Diagnosis not present

## 2024-04-13 MED ORDER — DAPAGLIFLOZIN PROPANEDIOL 10 MG PO TABS: 10.0000 mg | ORAL_TABLET | Freq: Every day | ORAL | 3 refills | Status: DC

## 2024-04-13 NOTE — Assessment & Plan Note (Signed)
Stable. Continue Repatha and rosuvastatin 40 mg daily.

## 2024-04-13 NOTE — Assessment & Plan Note (Signed)
 Blood pressure is in acceptable control. Continue amlodipine 10 mg daily, nebivolol 5 mg daily and losartan-HCTZ 50-12.5 bid.

## 2024-04-13 NOTE — Progress Notes (Signed)
 [facility] PRIMARY CARE [facility] [address] [place] [address] Dept: [phone] Dept Fax: [phone]  Chronic Care Office Visit  Subjective:    Patient ID: Simon Crumbley, male    DOB: 1971-10-04, 53 y.o..   MRN: [id number]  Chief Complaint  Patient presents with   Hypertension    3 month f/u.  No concerns.    History of Present Illness:  Patient is in today for reassessment of chronic medical issues.  Mr. Brutus had a left transmetatarsal amputation secondary to a diabetic foot infection in mid-Sept. 2023. He has some chronic edema of both lower legs, L>R due to chronic venous insufficiency. He had seen cardiology and had an echocardiogram that showed some mitral and aortic valve issues, but no heart failure.   Mr. Buzzelli has a history of hypertension. He is on amlodipine  10 mg daily, nebivolol  5 mg daily, and losartan -HCTZ (Hyzaar) 50-12.5 mg twice daily.     Mr. Necaise has had issues with uncontrolled diabetes, complicated by peripheral neuropathy. He is followed by Adalberto Hollow, NP (endocrinology). His last A1c was 8.0% (12/16/2023), which is stable. He is managed on dapagliflozin  10 mg daily, insulin  degludec (Tresiba ) 40 units bid, and Ozempic 2 mg weekly. His neuropathy is managed on gabapentin  600 mg daily as needed.  Mr. Umbarger has hyperlipidemia. He is managed on rosuvastatin  40 mg daily and Repatha .  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) 06/23/2023   Nuclear sclerotic cataract of both eyes 06/23/2023   Chronic kidney disease (CKD) stage G1/A2, glomerular filtration rate (GFR) equal to or greater than 90 mL/min/1.73 square meter and albuminuria creatinine ratio between 30-299 mg/g 11/12/2022   Type 2 diabetes mellitus with both eyes affected by mild nonproliferative  retinopathy without macular edema, with long-term current use of insulin  (HCC) 09/09/2022   S/P transmetatarsal amputation of foot, left (HCC) 08/18/2022   Normocytic anemia 07/24/2022   Diabetic foot infection (HCC) 07/23/2022   Chronic venous insufficiency of lower extremity 01/23/2021   Allergic rhinitis due to pollen 06/29/2020   Diabetic neuropathy (HCC) 03/13/2020   Erectile dysfunction due to arterial insufficiency 03/13/2020   Coronary artery disease involving native heart without angina pectoris 03/29/2019   Onychomycosis 12/01/2017   Obesity (BMI 30-39.9) 02/18/2016   Chest pain with moderate risk of acute coronary syndrome 09/17/2015   Type 2 diabetes mellitus with diabetic neuropathy, unspecified (HCC) 04/23/2015   Thrush 04/23/2015   Type 2 diabetes 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, with long-term current use of insulin  (HCC) 02/16/2014   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   Hammer toe, acquired 02/16/2014   CAD S/P percutaneous coronary angioplasty -  01/24/2014   Cardiac murmur 02/21/2011   Vitamin B12 deficiency 07/19/2010   Anemia due to vitamin B12 deficiency 10/31/2009   Trigger finger 10/16/2009   Dyslipidemia 03/01/2007   Gout 03/01/2007   Essential hypertension 03/01/2007   Gastroesophageal reflux disease 03/01/2007   Past Surgical History:  Procedure Laterality Date   AMPUTATION Left 07/25/2022   Procedure: AMPUTATION LEFT FOOT THROUGH MIDDLE OF FOOT;  Surgeon: Timothy Ford, MD;  Location: [facility] OR;  Service: Orthopedics;  Laterality: Left;   BICEPS TENDON REPAIR  Nov 13 2010   right   HAND SURGERY  1990   left   LEFT HEART CATH N/A 05/12/2013   Procedure: LEFT HEART CATH;  Surgeon: Mickiel Albany, MD;  Location: [facility] CATH LAB;  Service: Cardiovascular;  Laterality: N/A;   LEFT HEART CATHETERIZATION WITH CORONARY ANGIOGRAM N/A 01/24/2014   Procedure: LEFT HEART CATHETERIZATION WITH CORONARY ANGIOGRAM;  Surgeon: Peter M Swaziland, MD;  Location: [facility]  CATH LAB;  Service: Cardiovascular;  Laterality: N/A;   PERCUTANEOUS CORONARY STENT INTERVENTION (PCI-S)  05/2013   mCx 3.101mm x 20 mm Promus P DES   PERCUTANEOUS CORONARY STENT INTERVENTION (PCI-S)  05/12/2013   Procedure: PERCUTANEOUS CORONARY STENT INTERVENTION (PCI-S);  Surgeon: Mickiel Albany, MD;  Location: [facility] CATH LAB;  Service: Cardiovascular;;   Family History  Problem Relation Age of Onset   Stroke Mother    Heart attack Father        deceased   Diabetes Father    Congestive Heart Failure Father    Pancreatic cancer Maternal Aunt    Colon cancer Neg Hx    Outpatient Medications Prior to Visit  Medication Sig Dispense Refill   ACCU-CHEK GUIDE TEST test strip 1 each by Other route in the morning and at bedtime.     acetaminophen  (TYLENOL ) 325 MG tablet Take 2 tablets (650 mg total) by mouth every 4 (four) hours as needed for headache or mild pain.     allopurinol  (ZYLOPRIM ) 100 MG tablet TAKE 1 TABLET(100 MG) BY MOUTH DAILY 90 tablet 0   alum & mag hydroxide-simeth (MAALOX/MYLANTA) 200-200-20 MG/5ML suspension Take 15-30 mLs by mouth every 2 (two) hours as needed for indigestion. 355 mL 0   amLODipine  (NORVASC ) 10 MG tablet Take 1 tablet by mouth once daily 90 tablet 3   amLODipine  (NORVASC ) 10 MG tablet Take 1 tablet (10 mg total) by mouth daily. 90 tablet 2   aspirin  81 MG chewable tablet Chew 1 tablet (81 mg total) by mouth daily. (Patient taking differently: Chew 81 mg by mouth daily at 12 noon.)     bisacodyl  (DULCOLAX) 5 MG EC tablet Take 1 tablet (5 mg total) by mouth daily as needed for moderate constipation. 30 tablet 0   dexlansoprazole  (DEXILANT ) 60 MG capsule Take 1 capsule (60 mg total) by mouth daily. 90 capsule 1   docusate sodium  (COLACE) 100 MG capsule Take 1 capsule (100 mg total) by mouth daily. 10 capsule 0   Evolocumab  (REPATHA  SURECLICK) 140 MG/ML SOAJ Inject 140 mg into the skin every 14 (fourteen) days. 6 mL 1   FARXIGA  5 MG TABS tablet TAKE 1 TABLET(5 MG)  BY MOUTH DAILY BREAKFAST 90 tablet 3   fluticasone  (FLONASE ) 50 MCG/ACT nasal spray Place 1 spray into both nostrils daily as needed for allergies or rhinitis. 18.2 mL 6   furosemide  (LASIX ) 20 MG tablet Take 1 tablet (20 mg total) by mouth daily. 30 tablet 3   gabapentin  (NEURONTIN ) 600 MG tablet Take 1 tablet (600 mg total) by mouth daily as needed. 90 tablet 3   Glycerin-Polysorbate 80 (REFRESH DRY EYE THERAPY OP) Place 1 drop into both eyes 3 (three) times daily as needed (for dryness).     insulin  degludec (TRESIBA  FLEXTOUCH) 100 UNIT/ML FlexTouch Pen Inject 30 Units into the skin 2 (two) times daily. 45 mL 5   Insulin  Pen Needle 31G X 8 MM MISC 1 Device by Does not apply route in the morning, at noon, in the evening, and at bedtime. 400 each 2   ketoconazole  (NIZORAL ) 2 % cream Apply 1 Application topically daily. 60 g 2   losartan -hydrochlorothiazide (HYZAAR) 50-12.5 MG tablet Take 1 tablet  by mouth in the morning and at bedtime. 180 tablet 2   nebivolol  (BYSTOLIC ) 5 MG tablet NEW PRESCRIPTION REQUEST: TAKE ONE TABLET BY MOUTH DAILY 90 tablet 1   nitroGLYCERIN  (NITROSTAT ) 0.4 MG SL tablet DISSOLVE 1 TABLET UNDER THE TONGUE EVERY 5 MINUTES AS NEEDED FOR CHEST PAIN. DO NOT EXCEED A TOTAL OF 3 DOSES IN 15 MINUTES. 25 tablet 2   OZEMPIC, 2 MG/DOSE, 8 MG/3ML SOPN Inject into the skin.     polyethylene glycol (MIRALAX  / GLYCOLAX ) 17 g packet Take 17 g by mouth 2 (two) times daily. 14 each 0   rosuvastatin  (CRESTOR ) 40 MG tablet TAKE 1 TABLET(40 MG) BY MOUTH AT BEDTIME 90 tablet 3   tadalafil (CIALIS) 20 MG tablet Take 20 mg by mouth daily as needed.     vitamin B-12 (CYANOCOBALAMIN ) 500 MCG tablet Take 2 tablets (1,000 mcg total) by mouth daily. 90 tablet 3   No facility-administered medications prior to visit.   Allergies  Allergen Reactions   Influenza Vaccines Anaphylaxis    Reaction June 2017   Influenza Virus Vaccine H5n1 Anaphylaxis   Fenofibrate Other (See Comments)    Hot flashes    Lisinopril Cough   Metformin Other (See Comments)    Chest discomfort   Prednisone Other (See Comments)    Mood swings   Augmentin  [Amoxicillin -Pot Clavulanate] Rash    07/2022 tolerated cefazolin    Latex Rash   Objective:   Today's Vitals   04/13/24 1548  BP: 134/82  Pulse: 80  Temp: (!) 96.9 F (36.1 C)  TempSrc: Temporal  SpO2: 94%  Weight: 274 lb (124.3 kg)  Height: 6' (1.829 m)   Body mass index is 37.16 kg/m.   General: Well developed, well nourished. No acute distress. Feet- Skin intact. No sign of maceration between toes. Left stump is well healed. Nails are thickened, yellowed, and   crumbling.  Psych: Alert and oriented. Normal mood and affect.  Health Maintenance Due  Topic Date Due   Pneumococcal Vaccine 2-12 Years old (1 of 2 - PCV) Never done   Zoster Vaccines- Shingrix (1 of 2) Never done   Diabetic kidney evaluation - Urine ACR  11/12/2023   Diabetic kidney evaluation - eGFR measurement  02/04/2024     Assessment & Plan:   Problem List Items Addressed This Visit       Cardiovascular and Mediastinum   Essential hypertension (Chronic)   Blood pressure is in acceptable control. Continue amlodipine  10 mg daily, nebivolol  5 mg daily and losartan -HCTZ 50-12.5 bid.       Coronary artery disease involving native heart without angina pectoris   Stable. No angina. Continue nebivolol  5 mg daily, aspirin  81 mg daily and dapagliflozin  5 mg daily.        Endocrine   Type 2 diabetes mellitus with diabetic neuropathy, unspecified (HCC) - Primary   Continue to follow with Ms. Comer Decamp. I will recheck A1c today. Continue dapagliflozin  (Farxiga ) 10 mg daily, insulin  degludec 40 units bid, and semaglutide (Ozempic) 2 mg weekly.      Relevant Medications   dapagliflozin  propanediol (FARXIGA ) 10 MG TABS tablet   Other Relevant Orders   Microalbumin / creatinine urine ratio   Urinalysis w microscopic + reflex cultur   Glucose, random   Hemoglobin A1c   Basic  metabolic panel with GFR     Genitourinary   Chronic kidney disease (CKD) stage G1/A2, glomerular filtration rate (GFR) equal to or greater than 90 mL/min/1.73 square meter and albuminuria  creatinine ratio between 30-299 mg/g   He is currently on a SGLT2i and ARB. Continue focus on blood pressure and glucose control, adequate hydration, and avoidance of nephrotoxic medications.        Other   Dyslipidemia   Stable. Continue Repatha  and rosuvastatin  40 mg daily.      Obesity (BMI 30-39.9)   Maximum weight: 303 lbs (09/2023) Current weight: 274 lbs Weight change since last visit: - 18 lbs Total weight loss: - 29 lbs (9.6 %)  Discussed current dietary efforts. Ozempic appears to be assisting with weight loss.      Relevant Medications   dapagliflozin  propanediol (FARXIGA ) 10 MG TABS tablet    Return in about 3 months (around 07/14/2024) for Reassessment.   Graig Lawyer, MD

## 2024-04-13 NOTE — Assessment & Plan Note (Signed)
He is currently on a SGLT2i and ARB. Continue focus on blood pressure and glucose control, adequate hydration, and avoidance of nephrotoxic medications.

## 2024-04-13 NOTE — Assessment & Plan Note (Signed)
 Maximum weight: 303 lbs (09/2023) Current weight: 274 lbs Weight change since last visit: - 18 lbs Total weight loss: - 29 lbs (9.6 %)  Discussed current dietary efforts. Ozempic appears to be assisting with weight loss.

## 2024-04-13 NOTE — Assessment & Plan Note (Signed)
 Continue to follow with Aaron Franco. I will recheck A1c today. Continue dapagliflozin  (Farxiga ) 10 mg daily, insulin  degludec 40 units bid, and semaglutide (Ozempic) 2 mg weekly.

## 2024-04-13 NOTE — Assessment & Plan Note (Signed)
Stable. No angina. Continue nebivolol 5 mg daily, aspirin 81 mg daily and dapagliflozin 5 mg daily.

## 2024-04-14 ENCOUNTER — Ambulatory Visit: Payer: Self-pay | Admitting: Family Medicine

## 2024-04-14 LAB — URINALYSIS W MICROSCOPIC + REFLEX CULTURE
Bacteria, UA: NONE SEEN /HPF
Bilirubin Urine: NEGATIVE
Hgb urine dipstick: NEGATIVE
Hyaline Cast: NONE SEEN /LPF
Ketones, ur: NEGATIVE
Leukocyte Esterase: NEGATIVE
Nitrites, Initial: NEGATIVE
RBC / HPF: NONE SEEN /HPF (ref 0–2)
Specific Gravity, Urine: 1.036 — ABNORMAL HIGH (ref 1.001–1.035)
Squamous Epithelial / HPF: NONE SEEN /HPF (ref <=5)
WBC, UA: NONE SEEN /HPF (ref 0–5)
pH: 6 (ref 5.0–8.0)

## 2024-04-14 LAB — NO CULTURE INDICATED

## 2024-04-14 LAB — HEMOGLOBIN A1C: Hgb A1c MFr Bld: 9 % — ABNORMAL HIGH (ref 4.6–6.5)

## 2024-04-14 LAB — MICROALBUMIN / CREATININE URINE RATIO
Creatinine,U: 81.9 mg/dL
Microalb Creat Ratio: 231 mg/g — ABNORMAL HIGH (ref 0.0–30.0)
Microalb, Ur: 18.9 mg/dL — ABNORMAL HIGH (ref 0.0–1.9)

## 2024-04-14 LAB — BASIC METABOLIC PANEL WITH GFR
BUN: 15 mg/dL (ref 6–23)
CO2: 25 meq/L (ref 19–32)
Calcium: 9.1 mg/dL (ref 8.4–10.5)
Chloride: 100 meq/L (ref 96–112)
Creatinine, Ser: 0.89 mg/dL (ref 0.40–1.50)
GFR: 97.65 mL/min (ref >60.00)
Glucose, Bld: 193 mg/dL — ABNORMAL HIGH (ref 70–99)
Potassium: 3.9 meq/L (ref 3.5–5.1)
Sodium: 138 meq/L (ref 135–145)

## 2024-05-03 ENCOUNTER — Ambulatory Visit: Admitting: Podiatry

## 2024-05-03 DIAGNOSIS — M79674 Pain in right toe(s): Secondary | ICD-10-CM

## 2024-05-03 DIAGNOSIS — B351 Tinea unguium: Secondary | ICD-10-CM | POA: Diagnosis not present

## 2024-05-05 NOTE — Progress Notes (Signed)
  Subjective:  Patient ID: Aaron Franco, male    DOB: 1971/06/28,  MRN: [id number]  Chief Complaint  Patient presents with   [facility]    RM#1 [facility] patient states has no concerns at this time.    53 y.o. male presents with the above complaint. History confirmed with patient.  Nails are thick and elongated again..  Doing quite well blood sugar remains well-controlled  Objective:  Physical Exam: warm, good capillary refill, no trophic changes or ulcerative lesions, normal DP and PT pulses, and abnormal sensory exam with diffuse neuropathy.  Multiple small benign-appearing junctional nevi plantar midfoot Left Foot: Well-healed transmetatarsal amputation Right Foot: dystrophic yellowed discolored nail plates with subungual debris   Assessment:   1. Pain due to onychomycosis of toenail of right foot        Plan:  Patient was evaluated and treated and all questions answered.  TMA site well-healed no complications noted  Discussed the etiology and treatment options for the condition in detail with the patient.  Recommended debridement of the nails today. Sharp and mechanical debridement performed of all painful and mycotic nails today. Nails debrided in length and thickness using a nail nipper to level of comfort. Discussed treatment options including appropriate shoe gear. Follow up as needed for painful nails.      No follow-ups on file.

## 2024-05-09 ENCOUNTER — Encounter: Payer: Self-pay | Admitting: Family Medicine

## 2024-05-09 DIAGNOSIS — I2510 Atherosclerotic heart disease of native coronary artery without angina pectoris: Secondary | ICD-10-CM

## 2024-05-09 MED ORDER — NEBIVOLOL HCL 5 MG PO TABS: ORAL_TABLET | ORAL | 1 refills | Status: DC

## 2024-06-07 ENCOUNTER — Telehealth: Payer: Self-pay

## 2024-06-07 NOTE — Telephone Encounter (Signed)
 Patient was identified as falling into the [place] Measure - Diabetes.   Patient was: Appointment already scheduled for:  07/15/2024.

## 2024-06-15 DIAGNOSIS — H04123 Dry eye syndrome of bilateral lacrimal glands: Secondary | ICD-10-CM | POA: Diagnosis not present

## 2024-06-15 DIAGNOSIS — Z7984 Long term (current) use of oral hypoglycemic drugs: Secondary | ICD-10-CM | POA: Diagnosis not present

## 2024-06-15 DIAGNOSIS — E113413 Type 2 diabetes mellitus with severe nonproliferative diabetic retinopathy with macular edema, bilateral: Secondary | ICD-10-CM | POA: Diagnosis not present

## 2024-06-15 DIAGNOSIS — Z794 Long term (current) use of insulin: Secondary | ICD-10-CM | POA: Diagnosis not present

## 2024-07-12 DIAGNOSIS — E113413 Type 2 diabetes mellitus with severe nonproliferative diabetic retinopathy with macular edema, bilateral: Secondary | ICD-10-CM | POA: Diagnosis not present

## 2024-07-12 DIAGNOSIS — Z794 Long term (current) use of insulin: Secondary | ICD-10-CM | POA: Insufficient documentation

## 2024-07-12 DIAGNOSIS — E1165 Type 2 diabetes mellitus with hyperglycemia: Secondary | ICD-10-CM | POA: Diagnosis not present

## 2024-07-15 ENCOUNTER — Ambulatory Visit: Admitting: Family Medicine

## 2024-07-15 ENCOUNTER — Encounter: Payer: Self-pay | Admitting: Family Medicine

## 2024-07-15 VITALS — BP 132/84 | HR 86 | Temp 97.5°F | Ht 72.0 in | Wt 298.0 lb

## 2024-07-15 DIAGNOSIS — I2510 Atherosclerotic heart disease of native coronary artery without angina pectoris: Secondary | ICD-10-CM

## 2024-07-15 DIAGNOSIS — N181 Chronic kidney disease, stage 1: Secondary | ICD-10-CM

## 2024-07-15 DIAGNOSIS — Z7984 Long term (current) use of oral hypoglycemic drugs: Secondary | ICD-10-CM

## 2024-07-15 DIAGNOSIS — I10 Essential (primary) hypertension: Secondary | ICD-10-CM | POA: Diagnosis not present

## 2024-07-15 DIAGNOSIS — M1A09X0 Idiopathic chronic gout, multiple sites, without tophus (tophi): Secondary | ICD-10-CM | POA: Diagnosis not present

## 2024-07-15 DIAGNOSIS — E1142 Type 2 diabetes mellitus with diabetic polyneuropathy: Secondary | ICD-10-CM | POA: Diagnosis not present

## 2024-07-15 DIAGNOSIS — E785 Hyperlipidemia, unspecified: Secondary | ICD-10-CM

## 2024-07-15 DIAGNOSIS — K219 Gastro-esophageal reflux disease without esophagitis: Secondary | ICD-10-CM

## 2024-07-15 DIAGNOSIS — E113413 Type 2 diabetes mellitus with severe nonproliferative diabetic retinopathy with macular edema, bilateral: Secondary | ICD-10-CM

## 2024-07-15 DIAGNOSIS — Z794 Long term (current) use of insulin: Secondary | ICD-10-CM

## 2024-07-15 DIAGNOSIS — Z23 Encounter for immunization: Secondary | ICD-10-CM | POA: Diagnosis not present

## 2024-07-15 DIAGNOSIS — Z7985 Long-term (current) use of injectable non-insulin antidiabetic drugs: Secondary | ICD-10-CM

## 2024-07-15 MED ORDER — NEBIVOLOL HCL 5 MG PO TABS
5.0000 mg | ORAL_TABLET | Freq: Every day | ORAL | 3 refills | Status: AC
Start: 2024-07-15 — End: ?

## 2024-07-15 MED ORDER — ALLOPURINOL 100 MG PO TABS: 100.0000 mg | ORAL_TABLET | Freq: Every day | ORAL | 3 refills | Status: AC

## 2024-07-15 MED ORDER — GABAPENTIN 600 MG PO TABS: 600.0000 mg | ORAL_TABLET | Freq: Every day | ORAL | 3 refills | Status: AC | PRN

## 2024-07-15 MED ORDER — ROSUVASTATIN CALCIUM 40 MG PO TABS: ORAL_TABLET | ORAL | 3 refills | Status: AC

## 2024-07-15 MED ORDER — LOSARTAN POTASSIUM-HCTZ 50-12.5 MG PO TABS: 1.0000 | ORAL_TABLET | Freq: Two times a day (BID) | ORAL | 3 refills | Status: AC

## 2024-07-15 MED ORDER — TRESIBA FLEXTOUCH 100 UNIT/ML ~~LOC~~ SOPN: 40.0000 [IU] | PEN_INJECTOR | Freq: Two times a day (BID) | SUBCUTANEOUS | 5 refills | Status: DC

## 2024-07-15 MED ORDER — VITAMIN B-12 500 MCG PO TABS: 1000.0000 ug | ORAL_TABLET | Freq: Every day | ORAL | 3 refills | Status: AC

## 2024-07-15 MED ORDER — DEXLANSOPRAZOLE 60 MG PO CPDR: 60.0000 mg | DELAYED_RELEASE_CAPSULE | Freq: Every day | ORAL | 3 refills | Status: DC

## 2024-07-15 NOTE — Assessment & Plan Note (Signed)
 Blood pressure is in acceptable control. Continue amlodipine 10 mg daily, nebivolol 5 mg daily and losartan-HCTZ 50-12.5 bid.

## 2024-07-15 NOTE — Progress Notes (Signed)
 [facility] [address] [place] [address] Dept: [phone] Dept Fax: [phone]  Chronic Care Office Visit  Subjective:    Patient ID: Aaron Franco, male    DOB: [birth year], 53 y.o..   MRN: [id number]  Chief Complaint  Patient presents with   Diabetes    3 month f/u DM/HTN.  Average BP 132/60 -135/70.  Average BS  150-160.   History of Present Illness:  Patient is in today for reassessment of chronic medical issues.  Aaron Franco had a left transmetatarsal amputation secondary to a diabetic foot infection in mid-Sept. 2023. He has some chronic edema of both lower legs, L>R due to chronic venous insufficiency. He had seen cardiology and had an echocardiogram that showed some mitral and aortic valve issues, but no heart failure. Using compression stockings at times.  Aaron Franco has a history of of STEMI 2014 w/ DES CFX.  Cath demonstrated non obstructive disease in 2015. He had chest pain in 2018 and a negative perfusion study. He denies any chest pain. He is managed on aspirin  81 mg daily and nebivolol  5 mg daily.   Aaron Franco has a history of hypertension. He is on amlodipine  10 mg daily, nebivolol  5 mg daily, and losartan -HCTZ (Hyzaar) 50-12.5 mg twice daily.     Aaron Franco has had issues with uncontrolled diabetes, complicated by peripheral neuropathy. He is followed by Delon Arista, NP (endocrinology). His last A1c was 8.8% (07/12/2024). He is managed on dapagliflozin  10 mg daily and insulin  degludec (Tresiba ) 40 units bid. Aaron Franco is switching him from semaglutide (Ozempic) to tirzepatide (Mounjaro) 5 mg weekly. His neuropathy is managed on gabapentin  600 mg daily as needed.   Aaron Franco has hyperlipidemia. He is managed on rosuvastatin  40 mg daily and Repatha .  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, with long-term current use of insulin  (HCC) 07/12/2024   Mitral insufficiency and aortic stenosis 01/11/2024   Severe nonproliferative diabetic retinopathy of both eyes with macular edema associated with type 2 diabetes mellitus (HCC) 06/23/2023   Nuclear sclerotic cataract of both eyes 06/23/2023   Chronic kidney disease (CKD) stage G1/A2, glomerular filtration rate (GFR) equal to or greater than 90 mL/min/1.73 square meter and albuminuria creatinine ratio between 30-299 mg/g 11/12/2022   S/P transmetatarsal amputation of foot, left (HCC) 08/18/2022   Normocytic anemia 07/24/2022   Diabetic foot infection (HCC) 07/23/2022   Chronic venous insufficiency of lower extremity 01/23/2021   Allergic rhinitis due to pollen 06/29/2020   Diabetic neuropathy (HCC) 03/13/2020   Erectile dysfunction due to arterial insufficiency 03/13/2020   Coronary artery disease involving native heart without angina pectoris 03/29/2019   Onychomycosis 12/01/2017   Obesity (BMI 30-39.9) 02/18/2016   Chest pain with moderate risk of acute coronary syndrome 09/17/2015   Type 2 diabetes mellitus with diabetic neuropathy, unspecified (HCC) 04/23/2015   Thrush 04/23/2015   Type 2 diabetes 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, with long-term current use of insulin  (HCC) 02/16/2014   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   Hammer toe, acquired 02/16/2014   CAD S/P percutaneous coronary angioplasty -  01/24/2014   Cardiac murmur 02/21/2011   Vitamin B12 deficiency 07/19/2010   Anemia due to vitamin B12 deficiency 10/31/2009   Trigger finger 10/16/2009   Dyslipidemia 03/01/2007   Gout 03/01/2007   Essential hypertension 03/01/2007  Gastroesophageal reflux disease 03/01/2007   Past Surgical History:  Procedure Laterality Date   AMPUTATION Left 07/25/2022   Procedure: AMPUTATION LEFT FOOT THROUGH MIDDLE OF FOOT;  Surgeon: Aaron Jerona GAILS, MD;  Location: [facility] OR;   Service: Orthopedics;  Laterality: Left;   BICEPS TENDON REPAIR  Nov 13 2010   right   HAND SURGERY  1990   left   LEFT HEART CATH N/A 05/12/2013   Procedure: LEFT HEART CATH;  Surgeon: Aaron LELON Claudene DOUGLAS, MD;  Location: [facility] CATH LAB;  Service: Cardiovascular;  Laterality: N/A;   LEFT HEART CATHETERIZATION WITH CORONARY ANGIOGRAM N/A 01/24/2014   Procedure: LEFT HEART CATHETERIZATION WITH CORONARY ANGIOGRAM;  Surgeon: Aaron M [place]az[place], MD;  Location: [facility] CATH LAB;  Service: Cardiovascular;  Laterality: N/A;   PERCUTANEOUS CORONARY STENT INTERVENTION (PCI-S)  05/2013   mCx 3.97mm x 20 mm Promus P DES   PERCUTANEOUS CORONARY STENT INTERVENTION (PCI-S)  05/12/2013   Procedure: PERCUTANEOUS CORONARY STENT INTERVENTION (PCI-S);  Surgeon: Aaron LELON Claudene DOUGLAS, MD;  Location: [facility] CATH LAB;  Service: Cardiovascular;;   Family History  Problem Relation Age of Onset   Stroke Mother    Heart attack Father        deceased   Diabetes Father    Congestive Heart Failure Father    Pancreatic cancer Maternal Aunt    Colon cancer Neg Hx    Outpatient Medications Prior to Visit  Medication Sig Dispense Refill   ACCU-CHEK GUIDE TEST test strip 1 each by Other route in the morning and at bedtime.     acetaminophen  (TYLENOL ) 325 MG tablet Take 2 tablets (650 mg total) by mouth every 4 (four) hours as needed for headache or mild pain.     alum & mag hydroxide-simeth (MAALOX/MYLANTA) 200-200-20 MG/5ML suspension Take 15-30 mLs by mouth every 2 (two) hours as needed for indigestion. 355 mL 0   amLODipine  (NORVASC ) 10 MG tablet Take 1 tablet by mouth once daily 90 tablet 3   aspirin  81 MG chewable tablet Chew 1 tablet (81 mg total) by mouth daily. (Patient taking differently: Chew 81 mg by mouth daily at 12 noon.)     bisacodyl  (DULCOLAX) 5 MG EC tablet Take 1 tablet (5 mg total) by mouth daily as needed for moderate constipation. 30 tablet 0   dapagliflozin  propanediol (FARXIGA ) 10 MG TABS tablet Take 1 tablet (10 mg total)  by mouth daily. 90 tablet 3   docusate sodium  (COLACE) 100 MG capsule Take 1 capsule (100 mg total) by mouth daily. 10 capsule 0   Evolocumab  (REPATHA  SURECLICK) 140 MG/ML SOAJ Inject 140 mg into the skin every 14 (fourteen) days. 6 mL 1   fluticasone  (FLONASE ) 50 MCG/ACT nasal spray Place 1 spray into both nostrils daily as needed for allergies or rhinitis. 18.2 mL 6   furosemide  (LASIX ) 20 MG tablet Take 1 tablet (20 mg total) by mouth daily. 30 tablet 3   Glycerin-Polysorbate 80 (REFRESH DRY EYE THERAPY OP) Place 1 drop into both eyes 3 (three) times daily as needed (for dryness).     Insulin  Pen Needle 31G X 8 MM MISC 1 Device by Does not apply route in the morning, at noon, in the evening, and at bedtime. 400 each 2   ketoconazole  (NIZORAL ) 2 % cream Apply 1 Application topically daily. 60 g 2   nitroGLYCERIN  (NITROSTAT ) 0.4 MG SL tablet DISSOLVE 1 TABLET UNDER THE TONGUE EVERY 5 MINUTES AS NEEDED FOR CHEST PAIN. DO NOT EXCEED A TOTAL  OF 3 DOSES IN 15 MINUTES. 25 tablet 2   OZEMPIC, 2 MG/DOSE, 8 MG/3ML SOPN Inject into the skin.     polyethylene glycol (MIRALAX  / GLYCOLAX ) 17 g packet Take 17 g by mouth 2 (two) times daily. 14 each 0   tadalafil (CIALIS) 20 MG tablet Take 20 mg by mouth daily as needed.     allopurinol  (ZYLOPRIM ) 100 MG tablet TAKE 1 TABLET(100 MG) BY MOUTH DAILY 90 tablet 0   dexlansoprazole  (DEXILANT ) 60 MG capsule Take 1 capsule (60 mg total) by mouth daily. 90 capsule 1   gabapentin  (NEURONTIN ) 600 MG tablet Take 1 tablet (600 mg total) by mouth daily as needed. 90 tablet 3   insulin  degludec (TRESIBA  FLEXTOUCH) 100 UNIT/ML FlexTouch Pen Inject 30 Units into the skin 2 (two) times daily. 45 mL 5   losartan -hydrochlorothiazide (HYZAAR) 50-12.5 MG tablet Take 1 tablet by mouth in the morning and at bedtime. 180 tablet 2   nebivolol  (BYSTOLIC ) 5 MG tablet NEW PRESCRIPTION REQUEST: TAKE ONE TABLET BY MOUTH DAILY 90 tablet 1   rosuvastatin  (CRESTOR ) 40 MG tablet TAKE 1  TABLET(40 MG) BY MOUTH AT BEDTIME 90 tablet 3   vitamin B-12 (CYANOCOBALAMIN ) 500 MCG tablet Take 2 tablets (1,000 mcg total) by mouth daily. 90 tablet 3   No facility-administered medications prior to visit.   Allergies  Allergen Reactions   Influenza Vaccines Anaphylaxis    Reaction June 2017   Influenza Virus Vaccine H5n1 Anaphylaxis   Fenofibrate Other (See Comments)    Hot flashes   Lisinopril Cough   Metformin Other (See Comments)    Chest discomfort   Prednisone Other (See Comments)    Mood swings   Augmentin  [Amoxicillin -Pot Clavulanate] Rash    07/2022 tolerated cefazolin    Latex Rash   Objective:   Today's Vitals   07/15/24 1556  BP: 132/84  Pulse: 86  Temp: (!) 97.5 F (36.4 C)  TempSrc: Temporal  SpO2: 96%  Weight: 298 lb (135.2 kg)  Height: 6' (1.829 m)   Body mass index is 40.42 kg/m.   General: Well developed, well nourished. No acute distress. Right Foot- Skin intact. No sign of maceration between toes. Great nail is thickened and friable. Dorsalis pedis and   posterior tibial artery pulses are normal. Psych: Alert and oriented. Normal mood and affect.  Health Maintenance Due  Topic Date Due   Pneumococcal Vaccine: 50+ Years (1 of 2 - PCV) Never done   Hepatitis B Vaccines 19-59 Average Risk (1 of 3 - 19+ 3-dose series) Never done   Zoster Vaccines- Shingrix (1 of 2) Never done   Lab Results Component Ref Range & Units 3 d ago  Hemoglobin A1c 4.8 - 5.6 % 8.8 Abnormal      Assessment & Plan:   Problem List Items Addressed This Visit       Cardiovascular and Mediastinum   CAD S/P percutaneous coronary angioplasty -  (Chronic)   No chest pain. Continue aspirin  81 mg daily and nebivolol  5 mg daily.      Relevant Medications   rosuvastatin  (CRESTOR ) 40 MG tablet   losartan -hydrochlorothiazide (HYZAAR) 50-12.5 MG tablet   nebivolol  (BYSTOLIC ) 5 MG tablet   Essential hypertension (Chronic)   Blood pressure is in acceptable control.  Continue amlodipine  10 mg daily, nebivolol  5 mg daily and losartan -HCTZ 50-12.5 bid.       Relevant Medications   rosuvastatin  (CRESTOR ) 40 MG tablet   losartan -hydrochlorothiazide (HYZAAR) 50-12.5 MG tablet   nebivolol  (BYSTOLIC )  5 MG tablet     Digestive   Gastroesophageal reflux disease   Stable. Continue dexlansoprazole  60 mg daily.      Relevant Medications   dexlansoprazole  (DEXILANT ) 60 MG capsule     Endocrine   Diabetic neuropathy (HCC)   Stable. Continue gabapentin  600 mg daily.      Relevant Medications   rosuvastatin  (CRESTOR ) 40 MG tablet   losartan -hydrochlorothiazide (HYZAAR) 50-12.5 MG tablet   gabapentin  (NEURONTIN ) 600 MG tablet   insulin  degludec (TRESIBA  FLEXTOUCH) 100 UNIT/ML FlexTouch Pen   vitamin B-12 (CYANOCOBALAMIN ) 500 MCG tablet   tirzepatide (MOUNJARO) 5 MG/0.5ML Pen   Severe nonproliferative diabetic retinopathy of both eyes with macular edema associated with type 2 diabetes mellitus (HCC)   Has been referred to a retinal specialist. Anticipate he may need laser treatment.      Relevant Medications   rosuvastatin  (CRESTOR ) 40 MG tablet   losartan -hydrochlorothiazide (HYZAAR) 50-12.5 MG tablet   insulin  degludec (TRESIBA  FLEXTOUCH) 100 UNIT/ML FlexTouch Pen   tirzepatide (MOUNJARO) 5 MG/0.5ML Pen   Type 2 diabetes mellitus with both eyes affected by severe nonproliferative retinopathy and macular edema, with long-term current use of insulin  (HCC) - Primary   A1c is increased and not at goal.  Working with endocrinology. Continue dapagliflozin  10 mg daily and insulin  degludec (Tresiba ) 40 units bid. Switching from semaglutide (Ozempic) to tirzepatide (Mounjaro) 5 mg weekly.       Relevant Medications   rosuvastatin  (CRESTOR ) 40 MG tablet   losartan -hydrochlorothiazide (HYZAAR) 50-12.5 MG tablet   insulin  degludec (TRESIBA  FLEXTOUCH) 100 UNIT/ML FlexTouch Pen   tirzepatide (MOUNJARO) 5 MG/0.5ML Pen     Genitourinary   Chronic kidney disease  (CKD) stage G1/A2, glomerular filtration rate (GFR) equal to or greater than 90 mL/min/1.73 square meter and albuminuria creatinine ratio between 30-299 mg/g   He is currently on a SGLT2i and ARB. Continue focus on blood pressure and glucose control, adequate hydration, and avoidance of nephrotoxic medications.        Other   Dyslipidemia   Stable. Continue Repatha  and rosuvastatin  40 mg daily.      Relevant Medications   rosuvastatin  (CRESTOR ) 40 MG tablet   Gout   Stable. I will refill his allopurinol .      Relevant Medications   allopurinol  (ZYLOPRIM ) 100 MG tablet   Other Visit Diagnoses       Need for pneumococcal 20-valent conjugate vaccination       Relevant Orders   Pneumococcal conjugate vaccine 20-valent (Completed)       Return in about 3 months (around 10/14/2024) for Reassessment.   Garnette CHRISTELLA Simpler, MD

## 2024-07-15 NOTE — Assessment & Plan Note (Signed)
 Stable.  Continue gabapentin 600 mg daily.

## 2024-07-15 NOTE — Assessment & Plan Note (Signed)
He is currently on a SGLT2i and ARB. Continue focus on blood pressure and glucose control, adequate hydration, and avoidance of nephrotoxic medications.

## 2024-07-15 NOTE — Assessment & Plan Note (Signed)
 No chest pain. Continue aspirin  81 mg daily and nebivolol  5 mg daily.

## 2024-07-15 NOTE — Assessment & Plan Note (Signed)
Stable. I will refill his allopurinol.

## 2024-07-15 NOTE — Assessment & Plan Note (Signed)
 Stable. Continue dexlansoprazole  60 mg daily.

## 2024-07-15 NOTE — Assessment & Plan Note (Signed)
Stable. Continue Repatha and rosuvastatin 40 mg daily.

## 2024-07-15 NOTE — Assessment & Plan Note (Signed)
 Has been referred to a retinal specialist. Anticipate he may need laser treatment.

## 2024-07-15 NOTE — Assessment & Plan Note (Signed)
 A1c is increased and not at goal.  Working with endocrinology. Continue dapagliflozin  10 mg daily and insulin  degludec (Tresiba ) 40 units bid. Switching from semaglutide (Ozempic) to tirzepatide (Mounjaro) 5 mg weekly.

## 2024-07-16 ENCOUNTER — Other Ambulatory Visit: Payer: Self-pay | Admitting: Family Medicine

## 2024-07-16 DIAGNOSIS — E113413 Type 2 diabetes mellitus with severe nonproliferative diabetic retinopathy with macular edema, bilateral: Secondary | ICD-10-CM

## 2024-07-18 ENCOUNTER — Encounter: Payer: Self-pay | Admitting: Family Medicine

## 2024-07-19 ENCOUNTER — Encounter: Payer: Self-pay | Admitting: Family Medicine

## 2024-07-19 DIAGNOSIS — H35353 Cystoid macular degeneration, bilateral: Secondary | ICD-10-CM | POA: Diagnosis not present

## 2024-07-19 DIAGNOSIS — H35033 Hypertensive retinopathy, bilateral: Secondary | ICD-10-CM | POA: Diagnosis not present

## 2024-07-19 DIAGNOSIS — E1140 Type 2 diabetes mellitus with diabetic neuropathy, unspecified: Secondary | ICD-10-CM

## 2024-07-19 DIAGNOSIS — E113313 Type 2 diabetes mellitus with moderate nonproliferative diabetic retinopathy with macular edema, bilateral: Secondary | ICD-10-CM | POA: Diagnosis not present

## 2024-07-19 MED ORDER — DAPAGLIFLOZIN PROPANEDIOL 10 MG PO TABS: 10.0000 mg | ORAL_TABLET | Freq: Every day | ORAL | 3 refills | Status: AC

## 2024-07-22 ENCOUNTER — Encounter: Payer: Self-pay | Admitting: Family Medicine

## 2024-07-25 ENCOUNTER — Other Ambulatory Visit (HOSPITAL_COMMUNITY): Payer: Self-pay

## 2024-07-25 ENCOUNTER — Telehealth: Payer: Self-pay

## 2024-07-25 NOTE — Telephone Encounter (Signed)
 Patient notified VIA phone. He will get this from the pharmacy. Dm/cma

## 2024-07-25 NOTE — Telephone Encounter (Signed)
 REQUEST:     I have contacted the pts pref'd pharmacy and No Prior Auth is needed. The pt may think so because of the cost. Which is 600.00$ with his Insurance but it is a 64month supply. Per pharmacy the medication has been filled and ready for pickup for about a week.

## 2024-08-02 ENCOUNTER — Ambulatory Visit: Admitting: Podiatry

## 2024-08-15 ENCOUNTER — Ambulatory Visit (INDEPENDENT_AMBULATORY_CARE_PROVIDER_SITE_OTHER): Admitting: Podiatry

## 2024-08-15 VITALS — Ht 72.0 in | Wt 298.0 lb

## 2024-08-15 DIAGNOSIS — B351 Tinea unguium: Secondary | ICD-10-CM

## 2024-08-15 DIAGNOSIS — B353 Tinea pedis: Secondary | ICD-10-CM

## 2024-08-15 MED ORDER — TERBINAFINE HCL 250 MG PO TABS: 250.0000 mg | ORAL_TABLET | Freq: Every day | ORAL | 0 refills | Status: AC

## 2024-08-16 NOTE — Progress Notes (Signed)
  Subjective:  Patient ID: Aaron Franco, male    DOB: 1971-02-08,  MRN: [id number]  Chief Complaint  Patient presents with   Nail Problem    Rm 20 RFC    53 y.o. male presents with the above complaint. History confirmed with patient.  Nails are thick and elongated again..  Doing quite well blood sugar remains well-controlled.  Still using the ketoconazole  cream.  Objective:  Physical Exam: warm, good capillary refill, no trophic changes or ulcerative lesions, normal DP and PT pulses, and abnormal sensory exam with diffuse neuropathy.  Multiple small benign-appearing junctional nevi plantar midfoot Left Foot: Well-healed transmetatarsal amputation Right Foot: dystrophic yellowed discolored nail plates with subungual debris     Assessment:   1. Onychomycosis   2. Tinea pedis of both feet        Plan:  Patient was evaluated and treated and all questions answered.    Discussed the etiology and treatment options for the condition in detail with the patient.  Recommended debridement of the nails today. Sharp and mechanical debridement performed of all painful and mycotic nails today. Nails debrided in length and thickness using a nail nipper to level of comfort. Discussed treatment options including appropriate shoe gear.  His right hallux nail mycosis has worsened.  We discussed medical treatment of this with Lamisil therapy.  We discussed the risk benefits and potential complications of this.  All questions addressed.  Rx for 90-day course of Lamisil sent to pharmacy.  Follow-up with me in 3 months to reevaluate.  Photographs taken.      Return in about 3 months (around 11/15/2024) for at risk diabetic foot care, follow up after nail fungus treatment.

## 2024-09-04 ENCOUNTER — Encounter: Payer: Self-pay | Admitting: Family Medicine

## 2024-09-09 ENCOUNTER — Other Ambulatory Visit: Payer: Self-pay | Admitting: Family Medicine

## 2024-09-09 DIAGNOSIS — K219 Gastro-esophageal reflux disease without esophagitis: Secondary | ICD-10-CM

## 2024-09-09 MED ORDER — PANTOPRAZOLE SODIUM 40 MG PO TBEC: 40.0000 mg | DELAYED_RELEASE_TABLET | Freq: Every day | ORAL | 3 refills | Status: DC

## 2024-09-09 NOTE — Progress Notes (Signed)
 Received insurance denial for deslansoprazole. I will substitute pantoprazole .

## 2024-09-12 ENCOUNTER — Encounter: Payer: Self-pay | Admitting: Radiology

## 2024-10-03 ENCOUNTER — Other Ambulatory Visit (HOSPITAL_COMMUNITY): Payer: Self-pay

## 2024-10-10 ENCOUNTER — Telehealth: Payer: Self-pay

## 2024-10-10 NOTE — Telephone Encounter (Signed)
 Patient was identified as falling into the [place] Measure - Diabetes.   Patient was: Appointment already scheduled for:  10/14/24.

## 2024-10-11 ENCOUNTER — Other Ambulatory Visit: Payer: Self-pay

## 2024-10-11 DIAGNOSIS — Z794 Long term (current) use of insulin: Secondary | ICD-10-CM | POA: Diagnosis not present

## 2024-10-11 DIAGNOSIS — E1165 Type 2 diabetes mellitus with hyperglycemia: Secondary | ICD-10-CM | POA: Diagnosis not present

## 2024-10-11 LAB — HEMOGLOBIN A1C: Hemoglobin A1C: 8.1

## 2024-10-14 ENCOUNTER — Ambulatory Visit: Admitting: Family Medicine

## 2024-10-18 ENCOUNTER — Telehealth: Payer: Self-pay

## 2024-10-18 NOTE — Telephone Encounter (Signed)
 Patient was identified as falling into the [place] Measure - Diabetes.   Patient was: Patient had A1C drawn 12/2 outside [facility] and it was 8.1. Not due for another for 3 months.

## 2024-11-08 ENCOUNTER — Telehealth: Payer: Self-pay | Admitting: Pharmacy Technician

## 2024-11-08 ENCOUNTER — Other Ambulatory Visit (HOSPITAL_COMMUNITY): Payer: Self-pay

## 2024-11-08 NOTE — Telephone Encounter (Signed)
 Pharmacy Patient Advocate Encounter  Received notification from [facility] that Prior Authorization for repatha  has been APPROVED from 11/08/24 to 01/07/25   PA #/Case ID/Reference #: [id number]

## 2024-11-08 NOTE — Telephone Encounter (Signed)
 Pharmacy Patient Advocate Encounter   Received notification from CoverMyMeds that prior authorization for repatha  is required/requested.   Insurance verification completed.   The patient is insured through [facility].   Per test claim: PA required; PA submitted to above mentioned insurance via Latent Key/confirmation #/EOC [id number] Status is pending

## 2024-11-15 ENCOUNTER — Ambulatory Visit (INDEPENDENT_AMBULATORY_CARE_PROVIDER_SITE_OTHER): Admitting: Podiatry

## 2024-11-15 DIAGNOSIS — M79674 Pain in right toe(s): Secondary | ICD-10-CM | POA: Diagnosis not present

## 2024-11-15 DIAGNOSIS — B351 Tinea unguium: Secondary | ICD-10-CM | POA: Diagnosis not present

## 2024-11-15 MED ORDER — TERBINAFINE HCL 250 MG PO TABS: 250.0000 mg | ORAL_TABLET | Freq: Every day | ORAL | 0 refills | Status: AC

## 2024-11-15 NOTE — Progress Notes (Signed)
"  °  Subjective:  Patient ID: Aaron Franco, male    DOB: [birth year],  MRN: [id number]  Chief Complaint  Patient presents with   Diabetes    RM2 [facility] [place]    54 y.o. male presents with the above complaint. History confirmed with patient.  Nails are thick and elongated again..  Doing quite well blood sugar remains well-controlled.  Did not start the Lamisil  yet due to financial issues.  Objective:  Physical Exam: warm, good capillary refill, no trophic changes or ulcerative lesions, normal DP and PT pulses, and abnormal sensory exam with diffuse neuropathy.  Multiple small benign-appearing junctional nevi plantar midfoot Left Foot: Well-healed transmetatarsal amputation Right Foot: dystrophic yellowed discolored nail plates with subungual debris     Assessment:   1. Pain due to onychomycosis of toenail of right foot        Plan:  Patient was evaluated and treated and all questions answered.    Discussed the etiology and treatment options for the condition in detail with the patient.  Recommended debridement of the nails today. Sharp and mechanical debridement performed of all painful and mycotic nails today. Nails debrided in length and thickness using a nail nipper to level of comfort. Discussed treatment options including appropriate shoe gear.  He did not start the Lamisil  treatment yet due to financial issues.  He would like to start it now.  Rx for 90-day course of Lamisil  sent to pharmacy.  Follow-up with me in 3 months to reevaluate.  Photographs taken.      Return in about 3 months (around 02/13/2025) for at risk diabetic foot care.   "

## 2024-11-22 LAB — HM DIABETES EYE EXAM

## 2024-11-25 NOTE — Progress Notes (Signed)
 " [facility] [address] [place] [address] Dept: [phone] Dept Fax: [phone]  Chronic Care Office Visit  Subjective:    Patient ID: Aaron Franco, male    DOB: 1971-02-28, 55 y.o..   MRN: [id number]  Chief Complaint  Patient presents with   Diabetes    3 month f/u DM/HTN.  No concerns. BP 122/70-80, BS 150-170    History of Present Illness:  Patient is in today for reassessment of chronic medical conditions.   Mr. Ostrand had a left transmetatarsal amputation secondary to a diabetic foot infection in mid-Sept. 2023. He has some chronic edema of both lower legs, L>R due to chronic venous insufficiency.   Mr. Simington has a history of of STEMI 2014 w/ DES CFX.  Cath demonstrated non obstructive disease in 2015. He had chest pain in 2018 and a negative perfusion study. He denies any chest pain. He is managed on nebivolol  5 mg daily and aspirin  81 mg daily.   Mr. Dettman has a history of hypertension. He is on amlodipine  10 mg daily, nebivolol  5 mg daily, and losartan -HCTZ (Hyzaar) 50-12.5 mg twice daily.     Mr. Corpuz has had issues with uncontrolled diabetes, complicated by peripheral neuropathy. He is followed by Delon Arista, NP (endocrinology). His last A1c was 8.1% (10/11/2024). He is managed on dapagliflozin  10 mg daily, insulin  glargine (Lantus ) 40 units bid, and semaglutide (Ozempic) 2 mg weekly. His neuropathy is managed on gabapentin  600 mg daily as needed.   Mr. Denison has hyperlipidemia. He is managed on rosuvastatin  40 mg daily and Repatha .   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, with long-term current use of insulin  (HCC) 07/12/2024   Mitral insufficiency and aortic stenosis 01/11/2024   Severe nonproliferative diabetic retinopathy of both eyes with  macular edema associated with type 2 diabetes mellitus (HCC) 06/23/2023   Nuclear sclerotic cataract of both eyes 06/23/2023   Chronic kidney disease (CKD) stage G1/A2, glomerular filtration rate (GFR) equal to or greater than 90 mL/min/1.73 square meter and albuminuria creatinine ratio between 30-299 mg/g 11/12/2022   S/P transmetatarsal amputation of foot, left (HCC) 08/18/2022   Normocytic anemia 07/24/2022   Diabetic foot infection (HCC) 07/23/2022   Chronic venous insufficiency of lower extremity 01/23/2021   Allergic rhinitis due to pollen 06/29/2020   Diabetic neuropathy (HCC) 03/13/2020   Erectile dysfunction due to arterial insufficiency 03/13/2020   Coronary artery disease involving native heart without angina pectoris 03/29/2019   Onychomycosis 12/01/2017   Obesity (BMI 30-39.9) 02/18/2016   Chest pain with moderate risk of acute coronary syndrome 09/17/2015   Type 2 diabetes mellitus with diabetic neuropathy, unspecified (HCC) 04/23/2015   Thrush 04/23/2015   Type 2 diabetes 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, with long-term current use of insulin  (HCC) 02/16/2014   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   Hammer toe, acquired 02/16/2014   CAD S/P percutaneous coronary angioplasty -  01/24/2014   Cardiac murmur 02/21/2011   Vitamin B12 deficiency 07/19/2010   Anemia due to vitamin B12 deficiency 10/31/2009   Trigger finger 10/16/2009   Dyslipidemia 03/01/2007   Gout 03/01/2007   Essential hypertension 03/01/2007   Gastroesophageal reflux disease 03/01/2007   Past Surgical History:  Procedure Laterality Date   AMPUTATION Left 07/25/2022   Procedure: AMPUTATION LEFT FOOT THROUGH MIDDLE OF FOOT;  Surgeon: Harden Lame  V, MD;  Location: [facility] OR;  Service: Orthopedics;  Laterality: Left;   BICEPS TENDON REPAIR  Nov 13 2010   right   HAND SURGERY  1990   left   LEFT HEART CATH N/A 05/12/2013   Procedure: LEFT HEART CATH;  Surgeon: Victory LELON Claudene DOUGLAS, MD;  Location: [facility]  CATH LAB;  Service: Cardiovascular;  Laterality: N/A;   LEFT HEART CATHETERIZATION WITH CORONARY ANGIOGRAM N/A 01/24/2014   Procedure: LEFT HEART CATHETERIZATION WITH CORONARY ANGIOGRAM;  Surgeon: Peter M Jordan, MD;  Location: [facility] CATH LAB;  Service: Cardiovascular;  Laterality: N/A;   PERCUTANEOUS CORONARY STENT INTERVENTION (PCI-S)  05/2013   mCx 3.59mm x 20 mm Promus P DES   PERCUTANEOUS CORONARY STENT INTERVENTION (PCI-S)  05/12/2013   Procedure: PERCUTANEOUS CORONARY STENT INTERVENTION (PCI-S);  Surgeon: Victory LELON Claudene DOUGLAS, MD;  Location: [facility] CATH LAB;  Service: Cardiovascular;;   Family History  Problem Relation Age of Onset   Stroke Mother    Heart attack Father        deceased   Diabetes Father    Congestive Heart Failure Father    Pancreatic cancer Maternal Aunt    Colon cancer Neg Hx    Outpatient Medications Prior to Visit  Medication Sig Dispense Refill   ACCU-CHEK GUIDE TEST test strip 1 each by Other route in the morning and at bedtime.     acetaminophen  (TYLENOL ) 325 MG tablet Take 2 tablets (650 mg total) by mouth every 4 (four) hours as needed for headache or mild pain.     allopurinol  (ZYLOPRIM ) 100 MG tablet Take 1 tablet (100 mg total) by mouth daily. TAKE 1 TABLET(100 MG) BY MOUTH DAILY 90 tablet 3   alum & mag hydroxide-simeth (MAALOX/MYLANTA) 200-200-20 MG/5ML suspension Take 15-30 mLs by mouth every 2 (two) hours as needed for indigestion. 355 mL 0   amLODipine  (NORVASC ) 10 MG tablet Take 1 tablet by mouth once daily 90 tablet 3   aspirin  81 MG chewable tablet Chew 1 tablet (81 mg total) by mouth daily. (Patient taking differently: Chew 81 mg by mouth daily at 12 noon.)     bisacodyl  (DULCOLAX) 5 MG EC tablet Take 1 tablet (5 mg total) by mouth daily as needed for moderate constipation. 30 tablet 0   dapagliflozin  propanediol (FARXIGA ) 10 MG TABS tablet Take 1 tablet (10 mg total) by mouth daily. 90 tablet 3   docusate sodium  (COLACE) 100 MG capsule Take 1 capsule (100 mg  total) by mouth daily. 10 capsule 0   Evolocumab  (REPATHA  SURECLICK) 140 MG/ML SOAJ Inject 140 mg into the skin every 14 (fourteen) days. 6 mL 1   fluticasone  (FLONASE ) 50 MCG/ACT nasal spray Place 1 spray into both nostrils daily as needed for allergies or rhinitis. 18.2 mL 6   furosemide  (LASIX ) 20 MG tablet Take 1 tablet (20 mg total) by mouth daily. 30 tablet 3   gabapentin  (NEURONTIN ) 600 MG tablet Take 1 tablet (600 mg total) by mouth daily as needed. 90 tablet 3   Glycerin-Polysorbate 80 (REFRESH DRY EYE THERAPY OP) Place 1 drop into both eyes 3 (three) times daily as needed (for dryness).     insulin  degludec (TRESIBA  FLEXTOUCH) 100 UNIT/ML FlexTouch Pen Inject 40 Units into the skin 2 (two) times daily. 45 mL 5   Insulin  Pen Needle 31G X 8 MM MISC 1 Device by Does not apply route in the morning, at noon, in the evening, and at bedtime. 400 each 2   ketoconazole  (NIZORAL )  2 % cream Apply 1 Application topically daily. 60 g 2   losartan -hydrochlorothiazide (HYZAAR) 50-12.5 MG tablet Take 1 tablet by mouth in the morning and at bedtime. 180 tablet 3   nebivolol  (BYSTOLIC ) 5 MG tablet Take 1 tablet (5 mg total) by mouth daily. 90 tablet 3   nitroGLYCERIN  (NITROSTAT ) 0.4 MG SL tablet DISSOLVE 1 TABLET UNDER THE TONGUE EVERY 5 MINUTES AS NEEDED FOR CHEST PAIN. DO NOT EXCEED A TOTAL OF 3 DOSES IN 15 MINUTES. 25 tablet 2   pantoprazole  (PROTONIX ) 40 MG tablet Take 1 tablet (40 mg total) by mouth daily. 90 tablet 3   polyethylene glycol (MIRALAX  / GLYCOLAX ) 17 g packet Take 17 g by mouth 2 (two) times daily. 14 each 0   rosuvastatin  (CRESTOR ) 40 MG tablet TAKE 1 TABLET(40 MG) BY MOUTH AT BEDTIME 90 tablet 3   tadalafil (CIALIS) 20 MG tablet Take 20 mg by mouth daily as needed.     terbinafine  (LAMISIL ) 250 MG tablet Take 1 tablet (250 mg total) by mouth daily. 90 tablet 0   tirzepatide (MOUNJARO) 5 MG/0.5ML Pen Inject 5 mg into the skin once a week.     vitamin B-12 (CYANOCOBALAMIN ) 500 MCG tablet  Take 2 tablets (1,000 mcg total) by mouth daily. 90 tablet 3   No facility-administered medications prior to visit.   Allergies[1]   Objective:   Today's Vitals   11/28/24 1452  BP: 122/70  Pulse: 85  Temp: 98.6 F (37 C)  TempSrc: Temporal  SpO2: 95%  Height: 6' (1.829 m)   Body mass index is 40.42 kg/m.   General: Well developed, well nourished. No acute distress. Feet- Skin intact. No sign of maceration between right toes. Nails are thick, but trimmed. Left lower elg and foot are   swollen. Amputation site is well healed. Skin intact. No redness noted. Psych: Alert and oriented. Normal mood and affect.  Health Maintenance Due  Topic Date Due   COVID-19 Vaccine (1) Never done   Hepatitis B Vaccines 19-59 Average Risk (1 of 3 - 19+ 3-dose series) Never done   Zoster Vaccines- Shingrix (1 of 2) Never done   Influenza Vaccine  Never done   HEMOGLOBIN A1C  10/13/2024   OPHTHALMOLOGY EXAM  10/29/2024   Lab Results Lab Results  Component Value Date   HGBA1C 8.1 10/11/2024   HGBA1C 9.0 (H) 04/13/2024   HGBA1C 8.0 12/26/2023   Assessment & Plan:   Problem List Items Addressed This Visit       Cardiovascular and Mediastinum   CAD S/P percutaneous coronary angioplasty -  (Chronic)   Relevant Medications   nitroGLYCERIN  (NITROSTAT ) 0.4 MG SL tablet   amLODipine  (NORVASC ) 10 MG tablet   Essential hypertension (Chronic)   Blood pressure is in good control. Continue amlodipine  10 mg daily, nebivolol  5 mg daily and losartan -HCTZ 50-12.5 bid.      Relevant Medications   nitroGLYCERIN  (NITROSTAT ) 0.4 MG SL tablet   amLODipine  (NORVASC ) 10 MG tablet   Coronary artery disease involving native heart without angina pectoris - Primary   Stable. No angina. Continue nebivolol  5 mg daily, aspirin  81 mg daily and dapagliflozin  5 mg daily.      Relevant Medications   nitroGLYCERIN  (NITROSTAT ) 0.4 MG SL tablet   amLODipine  (NORVASC ) 10 MG tablet     Endocrine   Diabetic  neuropathy (HCC)   Stable. Continue gabapentin  600 mg daily.      Relevant Medications   LANTUS  SOLOSTAR 100 UNIT/ML Solostar Pen  OZEMPIC, 2 MG/DOSE, 8 MG/3ML SOPN   Severe nonproliferative diabetic retinopathy of both eyes with macular edema associated with type 2 diabetes mellitus (HCC)   Under care of retinal specialist.       Relevant Medications   LANTUS  SOLOSTAR 100 UNIT/ML Solostar Pen   OZEMPIC, 2 MG/DOSE, 8 MG/3ML SOPN   Type 2 diabetes mellitus with diabetic neuropathy, unspecified (HCC)   Continue to follow with Ms. Rilla. A1c improving. Continue dapagliflozin  (Farxiga ) 10 mg daily, insulin  glargine 40 units bid, and semaglutide (Ozempic) 2 mg weekly.      Relevant Medications   LANTUS  SOLOSTAR 100 UNIT/ML Solostar Pen   OZEMPIC, 2 MG/DOSE, 8 MG/3ML SOPN     Genitourinary   Chronic kidney disease (CKD) stage G1/A2, glomerular filtration rate (GFR) equal to or greater than 90 mL/min/1.73 square meter and albuminuria creatinine ratio between 30-299 mg/g   He is currently on a SGLT2i and ARB. Continue focus on blood pressure and glucose control, adequate hydration, and avoidance of nephrotoxic medications.        Other   Dyslipidemia   Stable. Continue Repatha  and rosuvastatin  40 mg daily.      S/P transmetatarsal amputation of foot, left (HCC)   Foot well healed and no sign of complications.      Other Visit Diagnoses       Encounter for long-term (current) insulin  use (HCC)         Long-term current use of injectable noninsulin antidiabetic medication         Long term current use of oral hypoglycemic drug           Return in about 3 months (around 02/26/2025) for Reassessment.   Garnette CHRISTELLA Simpler, MD  I,Emily Lagle,acting as a scribe for Garnette CHRISTELLA Simpler, MD.,have documented all relevant documentation on the behalf of Garnette CHRISTELLA Simpler, MD.  I, Garnette CHRISTELLA Simpler, MD, have reviewed all documentation for this visit. The documentation on 11/28/2024 for the exam,  diagnosis, procedures, and orders are all accurate and complete.     [1]  Allergies Allergen Reactions   Influenza A (H5n1) Monovalent Adjuvanted Vaccine Anaphylaxis   Influenza Vaccines Anaphylaxis    Reaction June 2017   Fenofibrate Other (See Comments)    Hot flashes   Lisinopril Cough   Metformin Other (See Comments)    Chest discomfort   Prednisone Other (See Comments)    Mood swings   Augmentin  [Amoxicillin -Pot Clavulanate] Rash    07/2022 tolerated cefazolin    Latex Rash   "

## 2024-11-28 ENCOUNTER — Encounter: Payer: Self-pay | Admitting: Family Medicine

## 2024-11-28 ENCOUNTER — Ambulatory Visit (INDEPENDENT_AMBULATORY_CARE_PROVIDER_SITE_OTHER): Admitting: Family Medicine

## 2024-11-28 VITALS — BP 122/70 | HR 85 | Temp 98.6°F | Ht 72.0 in

## 2024-11-28 DIAGNOSIS — Z7985 Long-term (current) use of injectable non-insulin antidiabetic drugs: Secondary | ICD-10-CM

## 2024-11-28 DIAGNOSIS — Z7984 Long term (current) use of oral hypoglycemic drugs: Secondary | ICD-10-CM | POA: Diagnosis not present

## 2024-11-28 DIAGNOSIS — I10 Essential (primary) hypertension: Secondary | ICD-10-CM

## 2024-11-28 DIAGNOSIS — E113413 Type 2 diabetes mellitus with severe nonproliferative diabetic retinopathy with macular edema, bilateral: Secondary | ICD-10-CM | POA: Diagnosis not present

## 2024-11-28 DIAGNOSIS — Z89432 Acquired absence of left foot: Secondary | ICD-10-CM | POA: Diagnosis not present

## 2024-11-28 DIAGNOSIS — E1142 Type 2 diabetes mellitus with diabetic polyneuropathy: Secondary | ICD-10-CM | POA: Diagnosis not present

## 2024-11-28 DIAGNOSIS — E785 Hyperlipidemia, unspecified: Secondary | ICD-10-CM

## 2024-11-28 DIAGNOSIS — N181 Chronic kidney disease, stage 1: Secondary | ICD-10-CM | POA: Diagnosis not present

## 2024-11-28 DIAGNOSIS — Z794 Long term (current) use of insulin: Secondary | ICD-10-CM

## 2024-11-28 DIAGNOSIS — Z9861 Coronary angioplasty status: Secondary | ICD-10-CM | POA: Diagnosis not present

## 2024-11-28 DIAGNOSIS — I2510 Atherosclerotic heart disease of native coronary artery without angina pectoris: Secondary | ICD-10-CM | POA: Diagnosis not present

## 2024-11-28 DIAGNOSIS — E1140 Type 2 diabetes mellitus with diabetic neuropathy, unspecified: Secondary | ICD-10-CM | POA: Diagnosis not present

## 2024-11-28 MED ORDER — AMLODIPINE BESYLATE 10 MG PO TABS: 10.0000 mg | ORAL_TABLET | Freq: Every day | ORAL | 3 refills | Status: AC

## 2024-11-28 MED ORDER — NITROGLYCERIN 0.4 MG SL SUBL: SUBLINGUAL_TABLET | SUBLINGUAL | 2 refills | Status: AC

## 2024-11-28 NOTE — Assessment & Plan Note (Signed)
Stable. Continue Repatha and rosuvastatin 40 mg daily.

## 2024-11-28 NOTE — Assessment & Plan Note (Signed)
Stable. I will refill his allopurinol.

## 2024-11-28 NOTE — Assessment & Plan Note (Signed)
Stable. No angina. Continue nebivolol 5 mg daily, aspirin 81 mg daily and dapagliflozin 5 mg daily.

## 2024-11-28 NOTE — Assessment & Plan Note (Signed)
 Foot well healed and no sign of complications.

## 2024-11-28 NOTE — Assessment & Plan Note (Signed)
 Stable.  Continue gabapentin 600 mg daily.

## 2024-11-28 NOTE — Assessment & Plan Note (Addendum)
 Continue to follow with Ms. Aaron Franco. A1c improving. Continue dapagliflozin  (Farxiga ) 10 mg daily, insulin  glargine 40 units bid, and semaglutide (Ozempic) 2 mg weekly.

## 2024-11-28 NOTE — Assessment & Plan Note (Signed)
?   Under care of retinal specialist

## 2024-11-28 NOTE — Assessment & Plan Note (Addendum)
 Blood pressure is in good control. Continue amlodipine  10 mg daily, nebivolol  5 mg daily and losartan -HCTZ 50-12.5 bid.

## 2024-11-28 NOTE — Assessment & Plan Note (Deleted)
 No chest pain. Continue aspirin  81 mg daily and nebivolol  5 mg daily.

## 2024-11-28 NOTE — Assessment & Plan Note (Signed)
He is currently on a SGLT2i and ARB. Continue focus on blood pressure and glucose control, adequate hydration, and avoidance of nephrotoxic medications.

## 2024-12-07 ENCOUNTER — Telehealth: Payer: Self-pay

## 2024-12-07 ENCOUNTER — Encounter: Payer: Self-pay | Admitting: Family Medicine

## 2024-12-07 NOTE — Telephone Encounter (Signed)
 Patient is not on the Dexilant  due to insurance will not cover.  They are on Pantoprazole . Dm/cma

## 2024-12-07 NOTE — Telephone Encounter (Signed)
 Copied from CRM #[id number]. Topic: Clinical - Prescription Issue >> Dec 07, 2024 11:46 AM Deaijah H wrote: Reason for CRM: [facility] w/ [facility] called in stating Dexlan needs an override for insurance to cover and pharmacy to fill. To override please call  [phone]

## 2024-12-07 NOTE — Telephone Encounter (Signed)
 Can you please and thank you do a PA for the Dexilant  60 mg.   Thanks Dm/cma

## 2024-12-08 ENCOUNTER — Other Ambulatory Visit (HOSPITAL_COMMUNITY): Payer: Self-pay

## 2024-12-08 ENCOUNTER — Telehealth: Payer: Self-pay | Admitting: Pharmacy Technician

## 2024-12-08 NOTE — Telephone Encounter (Signed)
 Pharmacy Patient Advocate Encounter   Received notification from Physician's Office that prior authorization for DEXILANT  60 MG is required/requested.   Insurance verification completed.   The patient is insured through [facility].   Per test claim: PA required; PA submitted to above mentioned insurance via Latent Key/confirmation #/EOC [id number] Status is pending

## 2024-12-08 NOTE — Telephone Encounter (Signed)
 Received another request on this patient in a separate encounter stating he is no longer on Dexilant  and should be on pantoprazole  instead. No PA needed on Pantoprazole , see separate encounter for details.

## 2024-12-08 NOTE — Telephone Encounter (Signed)
 Pharmacy Patient Advocate Encounter   Received notification from Physician's Office that prior authorization for PANTOPRAZOLE  is required/requested.   Insurance verification completed.   The patient is insured through [facility].   Per test claim: The current 30 day co-pay is, $10.90.  No PA needed at this time. This test claim was processed through [facility]- copay amounts may vary at other pharmacies due to pharmacy/plan contracts, or as the patient moves through the different stages of their insurance plan.

## 2024-12-08 NOTE — Addendum Note (Signed)
 Addended by: KYM KARNA CROME on: 12/08/2024 11:53 AM   Modules accepted: Orders

## 2024-12-08 NOTE — Telephone Encounter (Addendum)
 The previous message said patient was not on Dexilant  but is on pantoprazole  instead. I misunderstood you were asking for a PA on Dexilant , my apologies.   I do not see an active order for Dexilant .   Please clarify dosing instructions, strength, and prescriber for Dexilant  so PA can be completed.

## 2024-12-08 NOTE — Telephone Encounter (Signed)
 We still need an order to complete the PA.

## 2024-12-08 NOTE — Telephone Encounter (Signed)
 Pharmacy Patient Advocate Encounter  Received notification from [facility] that Prior Authorization for repatha  has been APPROVED from 12/08/24 to 12/08/25   PA #/Case ID/Reference #: [id number]

## 2024-12-08 NOTE — Telephone Encounter (Signed)
 Pa expires 01/07/25 on both insurances  Pharmacy Patient Advocate Encounter   Received notification from [facility] that prior authorization for REPATHA  is required/requested.   Insurance verification completed.   The patient is insured through [facility].   Per test claim: PA required; PA submitted to above mentioned insurance via Latent Key/confirmation #/EOC [id number] Status is pending

## 2024-12-09 ENCOUNTER — Other Ambulatory Visit (HOSPITAL_COMMUNITY): Payer: Self-pay

## 2024-12-14 NOTE — Telephone Encounter (Signed)
 PA required; PA started via CoverMyMeds. KEY [id number] . Please see clinical question(s) below that I am not finding the answer to in their chart and advise.    The patient's drug benefit plan provides coverage for other drugs which may be considered for treating your patient. Can your patient be treated with a formulary drug? Available Formulary Alternatives: esomeprazole delayed-rel, lansoprazole delayed-rel capsule, omeprazole  delayed-rel, pantoprazole  delayed-rel tablet [If yes, provide your patient with a new prescription for the formulary product.]

## 2024-12-14 NOTE — Telephone Encounter (Signed)
 Called patient.  He states that all the other medication do not help him .  He needs the Dexilant  to ease the acid reflux.    Please review and advise.  Thanks. Dm/cma

## 2024-12-14 NOTE — Telephone Encounter (Signed)
 Patient scheduled 12/16/24 to discuss acid reflux medication (PA).  Dm/cma

## 2024-12-14 NOTE — Telephone Encounter (Signed)
 PA submitted for Dexilant  60mg  on 12/08/24. Key/confirmation #/EOC [id number] Status is pending

## 2024-12-15 NOTE — Progress Notes (Unsigned)
" °  Cardiology Office Note:   Date:  12/16/2024  ID:  Aaron Franco, DOB Jul 04, 1971, MRN [id number] PCP: Thedora Garnette HERO, MD  [facility] Providers Cardiologist:  Lynwood Schilling, MD {  History of Present Illness:   Aaron Franco is a 54 y.o. male who presents for follow up of CAD.  He has a history of of STEMI 2014 w/ DES CFX.  Cath demonstrated non obstructive disease in 2015.   He had chest pain in 2018 and a negative perfusion study.  We saw him in Feb 2021 he was in the ED with chest pain.  He had a low risk perfusion study.    He returns for follow up.  Since I last saw him he has done well.  The patient denies any new symptoms such as chest discomfort, neck or arm discomfort. There has been no new shortness of breath, PND or orthopnea. There have been no reported palpitations, presyncope or syncope.  He tries to walk for exercise.   ROS: As stated in the HPI and negative for all other systems.  Studies Reviewed:    EKG:   EKG Interpretation Date/Time:  Friday December 16 2024 15:46:08 EST Ventricular Rate:  90 PR Interval:  170 QRS Duration:  90 QT Interval:  380 QTC Calculation: 464 R Axis:   -23  Text Interpretation: Normal sinus rhythm Normal ECG When compared with ECG of 11-Sep-2023 16:42, ectopy is no longer present. Confirmed by Schilling Rattan ([id number]) on 12/16/2024 4:22:22 PM    Risk Assessment/Calculations:              Physical Exam:   VS:  BP 129/82   Pulse 90   Ht 6' (1.829 m)   Wt 293 lb 3.2 oz (133 kg)   SpO2 98%   BMI 39.77 kg/m    Wt Readings from Last 3 Encounters:  12/16/24 293 lb 3.2 oz (133 kg)  08/15/24 298 lb (135.2 kg)  07/15/24 298 lb (135.2 kg)     GEN: Well nourished, well developed in no acute distress NECK: No JVD; No carotid bruits CARDIAC: RRR, 2 out of 6 systolic murmur radiating slightly at aortic outflow tract, no diastolic murmurs, rubs, gallops RESPIRATORY:  Clear to auscultation without rales, wheezing or rhonchi   ABDOMEN: Soft, non-tender, non-distended EXTREMITIES:  No edema; No deformity   ASSESSMENT AND PLAN:    CAD:  The patient has no new sypmtoms.  No further cardiovascular testing is indicated.  We will continue with aggressive risk reduction and meds as listed.  Hypertension:   The blood pressure is at target.  No change in therapy.       Hyperlipidemia:   LDL was 98 most recently but this was a while ago.  He is been off his Repatha  but just got this refilled.  I am going to have him come back for a lipid profile in 3 months with a goal LDL of 55.   DM2:    A1c was 8.1.  He is following with his primary provider and an endocrinologist.  They are adjusting his medications. =  AS: This was mild in March 2025.  I will follow-up probably with an echo next year.   Follow up with me in 1 year.  Signed, Lynwood Schilling, MD   "

## 2024-12-16 ENCOUNTER — Ambulatory Visit: Admitting: Cardiology

## 2024-12-16 ENCOUNTER — Ambulatory Visit: Admitting: Family Medicine

## 2024-12-16 VITALS — BP 129/82 | HR 90 | Ht 72.0 in | Wt 293.2 lb

## 2024-12-16 DIAGNOSIS — E118 Type 2 diabetes mellitus with unspecified complications: Secondary | ICD-10-CM

## 2024-12-16 DIAGNOSIS — E785 Hyperlipidemia, unspecified: Secondary | ICD-10-CM

## 2024-12-16 DIAGNOSIS — I10 Essential (primary) hypertension: Secondary | ICD-10-CM

## 2024-12-16 DIAGNOSIS — I350 Nonrheumatic aortic (valve) stenosis: Secondary | ICD-10-CM

## 2024-12-16 DIAGNOSIS — I2510 Atherosclerotic heart disease of native coronary artery without angina pectoris: Secondary | ICD-10-CM

## 2024-12-16 NOTE — Progress Notes (Incomplete)
 " [facility] [address] [place] [address] Dept: [phone] Dept Fax: [phone]  Office Visit  Subjective:    Patient ID: Aaron Franco, male    DOB: 28-Feb-1971, 54 y.o..   MRN: [id number]  No chief complaint on file.  History of Present Illness:  Patient is in today for GERD follow up.  Aaron Franco has a history of GERD, which has been managed with Dexilant  60 mg daily, and Pantoprazole  40 mg daily.   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, with long-term current use of insulin  (HCC) 07/12/2024   Mitral insufficiency and aortic stenosis 01/11/2024   Severe nonproliferative diabetic retinopathy of both eyes with macular edema associated with type 2 diabetes mellitus (HCC) 06/23/2023   Nuclear sclerotic cataract of both eyes 06/23/2023   Chronic kidney disease (CKD) stage G1/A2, glomerular filtration rate (GFR) equal to or greater than 90 mL/min/1.73 square meter and albuminuria creatinine ratio between 30-299 mg/g 11/12/2022   S/P transmetatarsal amputation of foot, left (HCC) 08/18/2022   Normocytic anemia 07/24/2022   Diabetic foot infection (HCC) 07/23/2022   Chronic venous insufficiency of lower extremity 01/23/2021   Allergic rhinitis due to pollen 06/29/2020   Diabetic neuropathy (HCC) 03/13/2020   Erectile dysfunction due to arterial insufficiency 03/13/2020   Coronary artery disease involving native heart without angina pectoris 03/29/2019   Onychomycosis 12/01/2017   Obesity (BMI 30-39.9) 02/18/2016   Chest pain with moderate risk of acute coronary syndrome 09/17/2015   Type 2 diabetes mellitus with diabetic neuropathy, unspecified (HCC) 04/23/2015   Thrush 04/23/2015   Type 2 diabetes 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, with long-term current use of  insulin  (HCC) 02/16/2014   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   Hammer toe, acquired 02/16/2014   CAD S/P percutaneous coronary angioplasty -  01/24/2014   Cardiac murmur 02/21/2011   Vitamin B12 deficiency 07/19/2010   Anemia due to vitamin B12 deficiency 10/31/2009   Trigger finger 10/16/2009   Dyslipidemia 03/01/2007   Gout 03/01/2007   Essential hypertension 03/01/2007   Gastroesophageal reflux disease 03/01/2007   Past Surgical History:  Procedure Laterality Date   AMPUTATION Left 07/25/2022   Procedure: AMPUTATION LEFT FOOT THROUGH MIDDLE OF FOOT;  Surgeon: Aaron Jerona GAILS, MD;  Location: [facility] OR;  Service: Orthopedics;  Laterality: Left;   BICEPS TENDON REPAIR  Nov 13 2010   right   HAND SURGERY  1990   left   LEFT HEART CATH N/A 05/12/2013   Procedure: LEFT HEART CATH;  Surgeon: Aaron LELON Claudene DOUGLAS, MD;  Location: [facility] CATH LAB;  Service: Cardiovascular;  Laterality: N/A;   LEFT HEART CATHETERIZATION WITH CORONARY ANGIOGRAM N/A 01/24/2014   Procedure: LEFT HEART CATHETERIZATION WITH CORONARY ANGIOGRAM;  Surgeon: Aaron M Jordan, MD;  Location: [facility] CATH LAB;  Service: Cardiovascular;  Laterality: N/A;   PERCUTANEOUS CORONARY STENT INTERVENTION (PCI-S)  05/2013   mCx 3.66mm x 20 mm Promus P DES   PERCUTANEOUS CORONARY STENT INTERVENTION (PCI-S)  05/12/2013   Procedure: PERCUTANEOUS CORONARY STENT INTERVENTION (PCI-S);  Surgeon: Aaron LELON Claudene DOUGLAS, MD;  Location: [facility] CATH LAB;  Service: Cardiovascular;;   Family History  Problem Relation Age of Onset   Stroke Mother    Heart attack Father        deceased   Diabetes Father    Congestive Heart Failure Father  Pancreatic cancer Maternal Aunt    Colon cancer Neg Hx    Outpatient Medications Prior to Visit  Medication Sig Dispense Refill   ACCU-CHEK GUIDE TEST test strip 1 each by Other route in the morning and at bedtime.     acetaminophen  (TYLENOL ) 325 MG tablet Take 2 tablets (650 mg total) by mouth every 4 (four) hours as needed for  headache or mild pain.     allopurinol  (ZYLOPRIM ) 100 MG tablet Take 1 tablet (100 mg total) by mouth daily. TAKE 1 TABLET(100 MG) BY MOUTH DAILY 90 tablet 3   alum & mag hydroxide-simeth (MAALOX/MYLANTA) 200-200-20 MG/5ML suspension Take 15-30 mLs by mouth every 2 (two) hours as needed for indigestion. 355 mL 0   amLODipine  (NORVASC ) 10 MG tablet Take 1 tablet (10 mg total) by mouth daily. 90 tablet 3   aspirin  81 MG chewable tablet Chew 1 tablet (81 mg total) by mouth daily.     bisacodyl  (DULCOLAX) 5 MG EC tablet Take 1 tablet (5 mg total) by mouth daily as needed for moderate constipation. 30 tablet 0   dapagliflozin  propanediol (FARXIGA ) 10 MG TABS tablet Take 1 tablet (10 mg total) by mouth daily. 90 tablet 3   dexlansoprazole  (DEXILANT ) 60 MG capsule Take 60 mg by mouth daily.     docusate sodium  (COLACE) 100 MG capsule Take 1 capsule (100 mg total) by mouth daily. 10 capsule 0   Evolocumab  (REPATHA  SURECLICK) 140 MG/ML SOAJ Inject 140 mg into the skin every 14 (fourteen) days. 6 mL 1   fluticasone  (FLONASE ) 50 MCG/ACT nasal spray Place 1 spray into both nostrils daily as needed for allergies or rhinitis. 18.2 mL 6   furosemide  (LASIX ) 20 MG tablet Take 1 tablet (20 mg total) by mouth daily. 30 tablet 3   gabapentin  (NEURONTIN ) 600 MG tablet Take 1 tablet (600 mg total) by mouth daily as needed. 90 tablet 3   Glycerin-Polysorbate 80 (REFRESH DRY EYE THERAPY OP) Place 1 drop into both eyes 3 (three) times daily as needed (for dryness).     Insulin  Pen Needle 31G X 8 MM MISC 1 Device by Does not apply route in the morning, at noon, in the evening, and at bedtime. 400 each 2   ketoconazole  (NIZORAL ) 2 % cream Apply 1 Application topically daily. 60 g 2   LANTUS  SOLOSTAR 100 UNIT/ML Solostar Pen 2 (two) times daily. (Patient taking differently: Inject 40 Units into the skin 2 (two) times daily.)     losartan -hydrochlorothiazide (HYZAAR) 50-12.5 MG tablet Take 1 tablet by mouth in the morning and  at bedtime. 180 tablet 3   nebivolol  (BYSTOLIC ) 5 MG tablet Take 1 tablet (5 mg total) by mouth daily. 90 tablet 3   nitroGLYCERIN  (NITROSTAT ) 0.4 MG SL tablet DISSOLVE 1 TABLET UNDER THE TONGUE EVERY 5 MINUTES AS NEEDED FOR CHEST PAIN. DO NOT EXCEED A TOTAL OF 3 DOSES IN 15 MINUTES. 25 tablet 2   OZEMPIC, 2 MG/DOSE, 8 MG/3ML SOPN SMARTSIG:2 Milligram(s) Once a Week     pantoprazole  (PROTONIX ) 40 MG tablet Take 1 tablet (40 mg total) by mouth daily. 90 tablet 3   polyethylene glycol (MIRALAX  / GLYCOLAX ) 17 g packet Take 17 g by mouth 2 (two) times daily. 14 each 0   rosuvastatin  (CRESTOR ) 40 MG tablet TAKE 1 TABLET(40 MG) BY MOUTH AT BEDTIME 90 tablet 3   tadalafil (CIALIS) 20 MG tablet Take 20 mg by mouth daily as needed.     terbinafine  (LAMISIL ) 250 MG  tablet Take 1 tablet (250 mg total) by mouth daily. 90 tablet 0   vitamin B-12 (CYANOCOBALAMIN ) 500 MCG tablet Take 2 tablets (1,000 mcg total) by mouth daily. 90 tablet 3   No facility-administered medications prior to visit.   Allergies[1]   Objective:   There were no vitals filed for this visit. There is no height or weight on file to calculate BMI.   General: Well developed, well nourished. No acute distress. HEENT: Normocephalic, non-traumatic. PERRL, EOMI. Conjunctiva clear. External ears normal. EAC and TMs normal bilaterally. Nose    clear without congestion or rhinorrhea. Mucous membranes moist. Oropharynx clear. Good dentition. Neck: Supple. No lymphadenopathy. No thyromegaly. Lungs: Clear to auscultation bilaterally. No wheezing, rales or rhonchi. CV: RRR without murmurs or rubs. Pulses 2+ bilaterally. Abdomen: Soft, non-tender. Bowel sounds positive, normal pitch and frequency. No hepatosplenomegaly. No rebound or guarding. Back: Straight. No CVA tenderness bilaterally. Extremities: Full ROM. No joint swelling or tenderness. No edema noted. Skin: Warm and dry. No rashes. Neuro: CN II-XII intact. Normal sensation and DTR  bilaterally. Psych: Alert and oriented. Normal mood and affect.  Health Maintenance Due  Topic Date Due   COVID-19 Vaccine (1) Never done   Hepatitis B Vaccines 19-59 Average Risk (1 of 3 - 19+ 3-dose series) Never done   Zoster Vaccines- Shingrix (1 of 2) Never done    Lab Results {Labs (Optional):29002}    Assessment & Plan:   Problem List Items Addressed This Visit   None   No follow-ups on file.    Garnette CHRISTELLA Simpler, MD  I,Emily Lagle,acting as a scribe for Garnette CHRISTELLA Simpler, MD.,have documented all relevant documentation on the behalf of Garnette CHRISTELLA Simpler, MD.  I, Garnette CHRISTELLA Simpler, MD, have reviewed all documentation for this visit. The documentation on 12/16/2024 for the exam, diagnosis, procedures, and orders are all accurate and complete.     [1]  Allergies Allergen Reactions   Influenza A (H5n1) Monovalent Adjuvanted Vaccine Anaphylaxis   Influenza Vaccines Anaphylaxis    Reaction June 2017   Fenofibrate Other (See Comments)    Hot flashes   Lisinopril Cough   Metformin Other (See Comments)    Chest discomfort   Prednisone Other (See Comments)    Mood swings   Augmentin  [Amoxicillin -Pot Clavulanate] Rash    07/2022 tolerated cefazolin    Latex Rash   "

## 2024-12-16 NOTE — Patient Instructions (Signed)
 Medication Instructions:  Your physician recommends that you continue on your current medications as directed. Please refer to the Current Medication list given to you today.  *If you need a refill on your cardiac medications before your next appointment, please call your pharmacy*  Lab Work: Fasting lipid panel in 3 months at [facility] If you have labs (blood work) drawn today and your tests are completely normal, you will receive your results only by: MyChart Message (if you have MyChart) OR A paper copy in the mail If you have any lab test that is abnormal or we need to change your treatment, we will call you to review the results.  Testing/Procedures: NONE  Follow-Up: At [facility], you and your health needs are our priority.  As part of our continuing mission to provide you with exceptional heart care, our providers are all part of one team.  This team includes your primary Cardiologist (physician) and Advanced Practice Providers or APPs (Physician Assistants and Nurse Practitioners) who all work together to provide you with the care you need, when you need it.  Your next appointment:   1 year(s)  Provider:   Lynwood Schilling, MD    We recommend signing up for the patient portal called MyChart.  Sign up information is provided on this After Visit Summary.  MyChart is used to connect with patients for Virtual Visits (Telemedicine).  Patients are able to view lab/test results, encounter notes, upcoming appointments, etc.  Non-urgent messages can be sent to your provider as well.   To learn more about what you can do with MyChart, go to [email or web address].

## 2024-12-19 ENCOUNTER — Ambulatory Visit: Admitting: Family Medicine

## 2025-02-14 ENCOUNTER — Ambulatory Visit: Admitting: Podiatry

## 2025-02-27 ENCOUNTER — Ambulatory Visit: Admitting: Family Medicine
